# Patient Record
Sex: Female | Born: 1950 | Race: White | Hispanic: No | Marital: Married | Smoking: Former smoker
Health system: Southern US, Community
[De-identification: ages and names within clinical notes are randomized; demographics above are authoritative.]

## PROBLEM LIST (undated history)

## (undated) DIAGNOSIS — Z9289 Personal history of other medical treatment: Secondary | ICD-10-CM

## (undated) DIAGNOSIS — Z8709 Personal history of other diseases of the respiratory system: Secondary | ICD-10-CM

## (undated) DIAGNOSIS — F329 Major depressive disorder, single episode, unspecified: Secondary | ICD-10-CM

## (undated) DIAGNOSIS — R531 Weakness: Secondary | ICD-10-CM

## (undated) DIAGNOSIS — M542 Cervicalgia: Secondary | ICD-10-CM

## (undated) DIAGNOSIS — K219 Gastro-esophageal reflux disease without esophagitis: Secondary | ICD-10-CM

## (undated) DIAGNOSIS — Z9889 Other specified postprocedural states: Secondary | ICD-10-CM

## (undated) DIAGNOSIS — R0600 Dyspnea, unspecified: Secondary | ICD-10-CM

## (undated) DIAGNOSIS — I10 Essential (primary) hypertension: Secondary | ICD-10-CM

## (undated) DIAGNOSIS — M1990 Unspecified osteoarthritis, unspecified site: Secondary | ICD-10-CM

## (undated) DIAGNOSIS — R112 Nausea with vomiting, unspecified: Secondary | ICD-10-CM

## (undated) DIAGNOSIS — J449 Chronic obstructive pulmonary disease, unspecified: Secondary | ICD-10-CM

## (undated) DIAGNOSIS — K5900 Constipation, unspecified: Secondary | ICD-10-CM

## (undated) DIAGNOSIS — R9439 Abnormal result of other cardiovascular function study: Secondary | ICD-10-CM

## (undated) DIAGNOSIS — T7840XA Allergy, unspecified, initial encounter: Secondary | ICD-10-CM

## (undated) DIAGNOSIS — G4730 Sleep apnea, unspecified: Secondary | ICD-10-CM

## (undated) DIAGNOSIS — E119 Type 2 diabetes mellitus without complications: Secondary | ICD-10-CM

## (undated) DIAGNOSIS — Z860100 Personal history of colon polyps, unspecified: Secondary | ICD-10-CM

## (undated) DIAGNOSIS — Z8719 Personal history of other diseases of the digestive system: Secondary | ICD-10-CM

## (undated) DIAGNOSIS — E785 Hyperlipidemia, unspecified: Secondary | ICD-10-CM

## (undated) DIAGNOSIS — F32A Depression, unspecified: Secondary | ICD-10-CM

## (undated) DIAGNOSIS — H409 Unspecified glaucoma: Secondary | ICD-10-CM

## (undated) DIAGNOSIS — G8929 Other chronic pain: Secondary | ICD-10-CM

## (undated) HISTORY — PX: CHOLECYSTECTOMY: SHX55

## (undated) HISTORY — DX: Morbid (severe) obesity due to excess calories: E66.01

## (undated) HISTORY — PX: COLONOSCOPY: SHX174

## (undated) HISTORY — PX: APPENDECTOMY: SHX54

## (undated) HISTORY — PX: BREAST REDUCTION SURGERY: SHX8

## (undated) HISTORY — DX: Personal history of other medical treatment: Z92.89

## (undated) HISTORY — PX: CATARACT EXTRACTION, BILATERAL: SHX1313

## (undated) HISTORY — PX: OTHER SURGICAL HISTORY: SHX169

## (undated) HISTORY — DX: Other specified postprocedural states: Z98.890

## (undated) HISTORY — PX: BACK SURGERY: SHX140

## (undated) HISTORY — DX: Abnormal result of other cardiovascular function study: R94.39

## (undated) HISTORY — PX: ESOPHAGOGASTRODUODENOSCOPY: SHX1529

---

## 1999-05-20 ENCOUNTER — Encounter: Payer: Self-pay | Admitting: Neurosurgery

## 1999-05-20 ENCOUNTER — Encounter: Admission: RE | Admit: 1999-05-20 | Discharge: 1999-05-20 | Payer: Self-pay | Admitting: Neurosurgery

## 1999-05-31 ENCOUNTER — Encounter: Payer: Self-pay | Admitting: Neurosurgery

## 1999-06-04 ENCOUNTER — Inpatient Hospital Stay (HOSPITAL_COMMUNITY): Admission: RE | Admit: 1999-06-04 | Discharge: 1999-06-07 | Payer: Self-pay | Admitting: Neurosurgery

## 1999-06-04 ENCOUNTER — Encounter: Payer: Self-pay | Admitting: Neurosurgery

## 2000-08-18 ENCOUNTER — Encounter: Admission: RE | Admit: 2000-08-18 | Discharge: 2000-08-18 | Payer: Self-pay | Admitting: Neurosurgery

## 2000-08-18 ENCOUNTER — Encounter: Payer: Self-pay | Admitting: Neurosurgery

## 2000-08-21 ENCOUNTER — Encounter: Payer: Self-pay | Admitting: Neurosurgery

## 2000-08-21 ENCOUNTER — Ambulatory Visit (HOSPITAL_COMMUNITY): Admission: RE | Admit: 2000-08-21 | Discharge: 2000-08-21 | Payer: Self-pay | Admitting: Neurosurgery

## 2000-09-01 ENCOUNTER — Encounter: Admission: RE | Admit: 2000-09-01 | Discharge: 2000-09-01 | Payer: Self-pay | Admitting: Neurosurgery

## 2000-09-01 ENCOUNTER — Encounter: Payer: Self-pay | Admitting: Neurosurgery

## 2000-09-15 ENCOUNTER — Encounter: Admission: RE | Admit: 2000-09-15 | Discharge: 2000-09-15 | Payer: Self-pay | Admitting: Neurosurgery

## 2000-09-15 ENCOUNTER — Encounter: Payer: Self-pay | Admitting: Neurosurgery

## 2000-09-29 ENCOUNTER — Encounter: Payer: Self-pay | Admitting: Neurosurgery

## 2000-09-29 ENCOUNTER — Encounter: Admission: RE | Admit: 2000-09-29 | Discharge: 2000-09-29 | Payer: Self-pay | Admitting: Neurosurgery

## 2002-05-17 ENCOUNTER — Observation Stay (HOSPITAL_COMMUNITY): Admission: EM | Admit: 2002-05-17 | Discharge: 2002-05-18 | Payer: Self-pay | Admitting: Cardiology

## 2003-05-25 ENCOUNTER — Encounter: Admission: RE | Admit: 2003-05-25 | Discharge: 2003-05-25 | Payer: Self-pay | Admitting: Neurosurgery

## 2003-07-18 ENCOUNTER — Inpatient Hospital Stay (HOSPITAL_COMMUNITY): Admission: RE | Admit: 2003-07-18 | Discharge: 2003-07-23 | Payer: Self-pay | Admitting: Neurosurgery

## 2003-10-30 ENCOUNTER — Ambulatory Visit (HOSPITAL_COMMUNITY): Admission: RE | Admit: 2003-10-30 | Discharge: 2003-10-30 | Payer: Self-pay | Admitting: Orthopaedic Surgery

## 2004-03-20 ENCOUNTER — Ambulatory Visit: Payer: Self-pay | Admitting: Internal Medicine

## 2004-03-21 ENCOUNTER — Ambulatory Visit (HOSPITAL_COMMUNITY): Admission: RE | Admit: 2004-03-21 | Discharge: 2004-03-21 | Payer: Self-pay | Admitting: Internal Medicine

## 2004-04-05 ENCOUNTER — Ambulatory Visit (HOSPITAL_COMMUNITY): Admission: RE | Admit: 2004-04-05 | Discharge: 2004-04-05 | Payer: Self-pay | Admitting: Internal Medicine

## 2004-04-05 ENCOUNTER — Ambulatory Visit: Payer: Self-pay | Admitting: Internal Medicine

## 2011-10-24 DIAGNOSIS — R079 Chest pain, unspecified: Secondary | ICD-10-CM

## 2012-11-28 ENCOUNTER — Inpatient Hospital Stay (HOSPITAL_COMMUNITY)
Admission: EM | Admit: 2012-11-28 | Discharge: 2012-12-04 | DRG: 490 | Disposition: A | Discharge status: Home Health | Payer: Commercial Indemnity | Attending: Internal Medicine | Admitting: Internal Medicine

## 2012-11-28 DIAGNOSIS — J449 Chronic obstructive pulmonary disease, unspecified: Secondary | ICD-10-CM | POA: Diagnosis present

## 2012-11-28 DIAGNOSIS — Z981 Arthrodesis status: Secondary | ICD-10-CM

## 2012-11-28 DIAGNOSIS — M549 Dorsalgia, unspecified: Secondary | ICD-10-CM

## 2012-11-28 DIAGNOSIS — M48061 Spinal stenosis, lumbar region without neurogenic claudication: Secondary | ICD-10-CM | POA: Diagnosis present

## 2012-11-28 DIAGNOSIS — N390 Urinary tract infection, site not specified: Secondary | ICD-10-CM | POA: Diagnosis present

## 2012-11-28 DIAGNOSIS — Z96659 Presence of unspecified artificial knee joint: Secondary | ICD-10-CM

## 2012-11-28 DIAGNOSIS — M545 Low back pain, unspecified: Secondary | ICD-10-CM | POA: Diagnosis present

## 2012-11-28 DIAGNOSIS — B961 Klebsiella pneumoniae [K. pneumoniae] as the cause of diseases classified elsewhere: Secondary | ICD-10-CM | POA: Diagnosis present

## 2012-11-28 DIAGNOSIS — Z87891 Personal history of nicotine dependence: Secondary | ICD-10-CM

## 2012-11-28 DIAGNOSIS — F411 Generalized anxiety disorder: Secondary | ICD-10-CM | POA: Diagnosis present

## 2012-11-28 DIAGNOSIS — R339 Retention of urine, unspecified: Secondary | ICD-10-CM | POA: Diagnosis present

## 2012-11-28 DIAGNOSIS — R29898 Other symptoms and signs involving the musculoskeletal system: Secondary | ICD-10-CM | POA: Diagnosis present

## 2012-11-28 DIAGNOSIS — I10 Essential (primary) hypertension: Secondary | ICD-10-CM | POA: Diagnosis present

## 2012-11-28 DIAGNOSIS — G8929 Other chronic pain: Secondary | ICD-10-CM | POA: Diagnosis present

## 2012-11-28 DIAGNOSIS — E785 Hyperlipidemia, unspecified: Secondary | ICD-10-CM | POA: Diagnosis present

## 2012-11-28 DIAGNOSIS — Z9181 History of falling: Secondary | ICD-10-CM

## 2012-11-28 DIAGNOSIS — E860 Dehydration: Secondary | ICD-10-CM | POA: Diagnosis present

## 2012-11-28 DIAGNOSIS — M5126 Other intervertebral disc displacement, lumbar region: Principal | ICD-10-CM | POA: Diagnosis present

## 2012-11-28 DIAGNOSIS — J4489 Other specified chronic obstructive pulmonary disease: Secondary | ICD-10-CM | POA: Diagnosis present

## 2012-11-28 HISTORY — DX: Unspecified osteoarthritis, unspecified site: M19.90

## 2012-11-28 HISTORY — DX: Essential (primary) hypertension: I10

## 2012-11-28 HISTORY — DX: Major depressive disorder, single episode, unspecified: F32.9

## 2012-11-28 HISTORY — DX: Sleep apnea, unspecified: G47.30

## 2012-11-28 HISTORY — DX: Hyperlipidemia, unspecified: E78.5

## 2012-11-28 HISTORY — DX: Depression, unspecified: F32.A

## 2012-11-28 HISTORY — DX: Chronic obstructive pulmonary disease, unspecified: J44.9

## 2012-11-28 HISTORY — DX: Gastro-esophageal reflux disease without esophagitis: K21.9

## 2012-11-28 HISTORY — DX: Personal history of other diseases of the digestive system: Z87.19

## 2012-11-28 LAB — COMPREHENSIVE METABOLIC PANEL
ALT: 20 U/L (ref 0–35)
AST: 23 U/L (ref 0–37)
Albumin: 4.5 g/dL (ref 3.5–5.2)
Alkaline Phosphatase: 53 U/L (ref 39–117)
BUN: 15 mg/dL (ref 6–23)
CO2: 25 mEq/L (ref 19–32)
Chloride: 100 mEq/L (ref 96–112)
Creatinine, Ser: 0.73 mg/dL (ref 0.50–1.10)
GFR calc Af Amer: >90 mL/min (ref >90)
GFR calc non Af Amer: 88 mL/min — ABNORMAL LOW (ref >90)
Glucose, Bld: 133 mg/dL — ABNORMAL HIGH (ref 70–99)
Potassium: 3.7 mEq/L (ref 3.5–5.1)
Sodium: 139 mEq/L (ref 135–145)
Total Bilirubin: 0.4 mg/dL (ref 0.3–1.2)
Total Protein: 7.9 g/dL (ref 6.0–8.3)

## 2012-11-28 LAB — URINALYSIS, ROUTINE W REFLEX MICROSCOPIC
Glucose, UA: NEGATIVE mg/dL
Hgb urine dipstick: NEGATIVE
Ketones, ur: NEGATIVE mg/dL
Leukocytes, UA: NEGATIVE
Nitrite: NEGATIVE
Protein, ur: 30 mg/dL — AB
Urobilinogen, UA: 0.2 mg/dL (ref 0.0–1.0)
pH: 5.5 (ref 5.0–8.0)

## 2012-11-28 LAB — CBC WITH DIFFERENTIAL/PLATELET
Basophils Absolute: 0 10*3/uL (ref 0.0–0.1)
Basophils Relative: 0 % (ref 0–1)
Eosinophils Absolute: 0 10*3/uL (ref 0.0–0.7)
HCT: 36.9 % (ref 36.0–46.0)
Hemoglobin: 12.8 g/dL (ref 12.0–15.0)
Lymphocytes Relative: 12 % (ref 12–46)
Lymphs Abs: 1.2 10*3/uL (ref 0.7–4.0)
MCH: 29.6 pg (ref 26.0–34.0)
MCHC: 34.7 g/dL (ref 30.0–36.0)
Monocytes Absolute: 0.4 10*3/uL (ref 0.1–1.0)
Monocytes Relative: 4 % (ref 3–12)
Neutro Abs: 8 10*3/uL — ABNORMAL HIGH (ref 1.7–7.7)
Neutrophils Relative %: 83 % — ABNORMAL HIGH (ref 43–77)
Platelets: 176 10*3/uL (ref 150–400)
RBC: 4.33 MIL/uL (ref 3.87–5.11)
RDW: 13 % (ref 11.5–15.5)
WBC: 9.6 10*3/uL (ref 4.0–10.5)

## 2012-11-28 LAB — URINE MICROSCOPIC-ADD ON

## 2012-11-28 MED ORDER — ALUM & MAG HYDROXIDE-SIMETH 200-200-20 MG/5ML PO SUSP
30.0000 mL | Freq: Four times a day (QID) | ORAL | Status: DC | PRN
  Administered 2012-12-01: 30 mL via ORAL
  Filled 2012-11-28: qty 30

## 2012-11-28 MED ORDER — HYDROMORPHONE HCL PF 1 MG/ML IJ SOLN
1.0000 mg | Freq: Once | INTRAMUSCULAR | Status: AC
  Administered 2012-11-28: 1 mg via INTRAVENOUS
  Filled 2012-11-28: qty 1

## 2012-11-28 MED ORDER — KETOROLAC TROMETHAMINE 15 MG/ML IJ SOLN
15.0000 mg | Freq: Four times a day (QID) | INTRAMUSCULAR | Status: AC | PRN
  Filled 2012-11-28: qty 1

## 2012-11-28 MED ORDER — ADULT MULTIVITAMIN W/MINERALS CH
1.0000 | ORAL_TABLET | Freq: Every day | ORAL | Status: DC
  Administered 2012-11-28 – 2012-12-04 (×5): 1 via ORAL
  Filled 2012-11-28 (×7): qty 1

## 2012-11-28 MED ORDER — ACETAMINOPHEN 650 MG RE SUPP: 650.0000 mg | Freq: Four times a day (QID) | RECTAL | Status: DC | PRN

## 2012-11-28 MED ORDER — ACETAMINOPHEN 325 MG PO TABS
650.0000 mg | ORAL_TABLET | Freq: Four times a day (QID) | ORAL | Status: DC | PRN
  Filled 2012-11-28: qty 2

## 2012-11-28 MED ORDER — KETOROLAC TROMETHAMINE 30 MG/ML IJ SOLN
INTRAMUSCULAR | Status: AC
  Filled 2012-11-28: qty 1

## 2012-11-28 MED ORDER — ASPIRIN 81 MG PO CHEW
81.0000 mg | CHEWABLE_TABLET | Freq: Every day | ORAL | Status: DC
  Administered 2012-11-28 – 2012-12-04 (×5): 81 mg via ORAL
  Filled 2012-11-28 (×6): qty 1

## 2012-11-28 MED ORDER — HEPARIN SODIUM (PORCINE) 5000 UNIT/ML IJ SOLN
5000.0000 [IU] | Freq: Three times a day (TID) | INTRAMUSCULAR | Status: DC
  Filled 2012-11-28 (×4): qty 1

## 2012-11-28 MED ORDER — ATORVASTATIN CALCIUM 40 MG PO TABS
40.0000 mg | ORAL_TABLET | Freq: Every day | ORAL | Status: DC
  Administered 2012-11-28 – 2012-12-02 (×3): 40 mg via ORAL
  Filled 2012-11-28 (×7): qty 1

## 2012-11-28 MED ORDER — LORATADINE 10 MG PO TABS
10.0000 mg | ORAL_TABLET | Freq: Every day | ORAL | Status: DC
  Administered 2012-11-28 – 2012-12-04 (×6): 10 mg via ORAL
  Filled 2012-11-28 (×7): qty 1

## 2012-11-28 MED ORDER — ONDANSETRON HCL 4 MG/2ML IJ SOLN
4.0000 mg | Freq: Four times a day (QID) | INTRAMUSCULAR | Status: DC | PRN
  Administered 2012-11-28 – 2012-12-02 (×4): 4 mg via INTRAVENOUS
  Filled 2012-11-28 (×4): qty 2

## 2012-11-28 MED ORDER — KETOROLAC TROMETHAMINE 30 MG/ML IJ SOLN
30.0000 mg | Freq: Once | INTRAMUSCULAR | Status: AC
  Administered 2012-11-28: 30 mg via INTRAVENOUS

## 2012-11-28 MED ORDER — OMEGA-3-ACID ETHYL ESTERS 1 G PO CAPS
2.0000 g | ORAL_CAPSULE | Freq: Two times a day (BID) | ORAL | Status: DC
  Administered 2012-11-28 – 2012-12-04 (×9): 2 g via ORAL
  Filled 2012-11-28 (×14): qty 2

## 2012-11-28 MED ORDER — PANTOPRAZOLE SODIUM 40 MG PO TBEC
40.0000 mg | DELAYED_RELEASE_TABLET | Freq: Every day | ORAL | Status: DC
  Administered 2012-11-28 – 2012-12-01 (×4): 40 mg via ORAL
  Filled 2012-11-28 (×5): qty 1

## 2012-11-28 MED ORDER — SODIUM CHLORIDE 0.9 % IV SOLN
INTRAVENOUS | Status: DC
  Administered 2012-11-28 – 2012-11-29 (×2): 75 mL/h via INTRAVENOUS
  Administered 2012-11-30 (×2): via INTRAVENOUS
  Administered 2012-12-01: 100 mL/h via INTRAVENOUS

## 2012-11-28 MED ORDER — MONTELUKAST SODIUM 10 MG PO TABS
10.0000 mg | ORAL_TABLET | Freq: Every day | ORAL | Status: DC
  Administered 2012-11-30 – 2012-12-03 (×4): 10 mg via ORAL
  Filled 2012-11-28 (×7): qty 1

## 2012-11-28 MED ORDER — SENNA 8.6 MG PO TABS
1.0000 | ORAL_TABLET | Freq: Every day | ORAL | Status: DC
  Administered 2012-11-28 – 2012-12-04 (×6): 8.6 mg via ORAL
  Filled 2012-11-28 (×7): qty 1

## 2012-11-28 MED ORDER — HYDROCODONE-ACETAMINOPHEN 5-325 MG PO TABS
1.0000 | ORAL_TABLET | Freq: Once | ORAL | Status: AC
  Administered 2012-11-28: 1 via ORAL
  Filled 2012-11-28: qty 1

## 2012-11-28 MED ORDER — HYDROMORPHONE HCL PF 1 MG/ML IJ SOLN: 1.0000 mg | INTRAMUSCULAR | Status: DC | PRN

## 2012-11-28 MED ORDER — HYDROMORPHONE HCL PF 1 MG/ML IJ SOLN
1.0000 mg | INTRAMUSCULAR | Status: DC | PRN
  Administered 2012-11-28 – 2012-11-29 (×2): 2 mg via INTRAVENOUS
  Administered 2012-11-29 – 2012-11-30 (×4): 1 mg via INTRAVENOUS
  Administered 2012-12-01 (×2): 2 mg via INTRAVENOUS
  Administered 2012-12-02 – 2012-12-03 (×2): 1 mg via INTRAVENOUS
  Filled 2012-11-28: qty 1
  Filled 2012-11-28: qty 2
  Filled 2012-11-28: qty 1
  Filled 2012-11-28 (×2): qty 2
  Filled 2012-11-28: qty 1
  Filled 2012-11-28: qty 2
  Filled 2012-11-28 (×3): qty 1
  Filled 2012-11-28: qty 2
  Filled 2012-11-28: qty 1

## 2012-11-28 MED ORDER — LISINOPRIL 40 MG PO TABS
40.0000 mg | ORAL_TABLET | Freq: Every day | ORAL | Status: DC
  Administered 2012-11-28 – 2012-12-04 (×6): 40 mg via ORAL
  Filled 2012-11-28 (×7): qty 1

## 2012-11-28 MED ORDER — OMEGA-3 FATTY ACIDS 1000 MG PO CAPS: 2.0000 g | ORAL_CAPSULE | Freq: Two times a day (BID) | ORAL | Status: DC

## 2012-11-28 MED ORDER — TEMAZEPAM 15 MG PO CAPS
15.0000 mg | ORAL_CAPSULE | Freq: Every evening | ORAL | Status: DC | PRN
  Administered 2012-11-28 – 2012-12-03 (×6): 15 mg via ORAL
  Filled 2012-11-28 (×6): qty 1

## 2012-11-28 MED ORDER — SERTRALINE HCL 100 MG PO TABS
100.0000 mg | ORAL_TABLET | Freq: Every day | ORAL | Status: DC
  Administered 2012-11-28: 100 mg via ORAL
  Filled 2012-11-28 (×2): qty 1

## 2012-11-28 MED ORDER — OXYCODONE-ACETAMINOPHEN 5-325 MG PO TABS
1.0000 | ORAL_TABLET | ORAL | Status: DC | PRN
  Administered 2012-11-28 – 2012-12-04 (×14): 2 via ORAL
  Filled 2012-11-28 (×16): qty 2

## 2012-11-28 MED ORDER — ONDANSETRON HCL 4 MG PO TABS
4.0000 mg | ORAL_TABLET | Freq: Four times a day (QID) | ORAL | Status: DC | PRN
  Filled 2012-11-28: qty 1

## 2012-11-28 MED ORDER — ALBUTEROL SULFATE (5 MG/ML) 0.5% IN NEBU: 2.5000 mg | INHALATION_SOLUTION | RESPIRATORY_TRACT | Status: DC | PRN

## 2012-11-28 MED ORDER — HYDROMORPHONE HCL PF 1 MG/ML IJ SOLN
INTRAMUSCULAR | Status: AC
  Filled 2012-11-28: qty 2

## 2012-11-28 MED ORDER — HYDROMORPHONE HCL PF 1 MG/ML IJ SOLN
2.0000 mg | Freq: Once | INTRAMUSCULAR | Status: AC
  Administered 2012-11-28: 2 mg via INTRAVENOUS

## 2012-11-28 NOTE — ED Notes (Signed)
Labs sent down to main lab.

## 2012-11-28 NOTE — Consult Note (Signed)
Reason for Consult:LBP Referring Physician: HOSPITALIST  Lynn Dixon is an 62 y.o. female.  HPI: PATIENT WHo had lumbar fusion by me in the past and now she is admitted because of several weeks of lbp with radiation to the left leg associated with burning sensation down to foot not better with pain medications or TENS UNIT No past medical history on file.  LUMBAR FUSION  No family history on file.  Social History:  has no tobacco, alcohol, and drug history on file.  Allergies:  Allergies  Allergen Reactions  . Coconut Oil Anaphylaxis  . Morphine And Related Other (See Comments)    unknown  . Estrogens Rash    Medications: see H&P  Results for orders placed during the hospital encounter of 11/28/12 (from the past 48 hour(s))  CBC WITH DIFFERENTIAL     Status: Abnormal   Collection Time    11/28/12  2:47 AM      Result Value Range   WBC 9.6  4.0 - 10.5 K/uL   RBC 4.33  3.87 - 5.11 MIL/uL   Hemoglobin 12.8  12.0 - 15.0 g/dL   HCT 40.9  81.1 - 91.4 %   MCV 85.2  78.0 - 100.0 fL   MCH 29.6  26.0 - 34.0 pg   MCHC 34.7  30.0 - 36.0 g/dL   RDW 78.2  95.6 - 21.3 %   Platelets 176  150 - 400 K/uL   Neutrophils Relative % 83 (*) 43 - 77 %   Neutro Abs 8.0 (*) 1.7 - 7.7 K/uL   Lymphocytes Relative 12  12 - 46 %   Lymphs Abs 1.2  0.7 - 4.0 K/uL   Monocytes Relative 4  3 - 12 %   Monocytes Absolute 0.4  0.1 - 1.0 K/uL   Eosinophils Relative 0  0 - 5 %   Eosinophils Absolute 0.0  0.0 - 0.7 K/uL   Basophils Relative 0  0 - 1 %   Basophils Absolute 0.0  0.0 - 0.1 K/uL  COMPREHENSIVE METABOLIC PANEL     Status: Abnormal   Collection Time    11/28/12  2:47 AM      Result Value Range   Sodium 139  135 - 145 mEq/L   Potassium 3.7  3.5 - 5.1 mEq/L   Chloride 100  96 - 112 mEq/L   CO2 25  19 - 32 mEq/L   Glucose, Bld 133 (*) 70 - 99 mg/dL   BUN 15  6 - 23 mg/dL   Creatinine, Ser 0.86  0.50 - 1.10 mg/dL   Calcium 9.8  8.4 - 57.8 mg/dL   Total Protein 7.9  6.0 - 8.3 g/dL   Albumin 4.5  3.5 - 5.2 g/dL   AST 23  0 - 37 U/L   ALT 20  0 - 35 U/L   Alkaline Phosphatase 53  39 - 117 U/L   Total Bilirubin 0.4  0.3 - 1.2 mg/dL   GFR calc non Af Amer 88 (*) >90 mL/min   GFR calc Af Amer >90  >90 mL/min   Comment: (NOTE)     The eGFR has been calculated using the CKD EPI equation.     This calculation has not been validated in all clinical situations.     eGFR's persistently <90 mL/min signify possible Chronic Kidney     Disease.  URINALYSIS, ROUTINE W REFLEX MICROSCOPIC     Status: Abnormal   Collection Time    11/28/12  3:06  AM      Result Value Range   Color, Urine YELLOW  YELLOW   APPearance CLEAR  CLEAR   Specific Gravity, Urine 1.015  1.005 - 1.030   pH 5.5  5.0 - 8.0   Glucose, UA NEGATIVE  NEGATIVE mg/dL   Hgb urine dipstick NEGATIVE  NEGATIVE   Bilirubin Urine NEGATIVE  NEGATIVE   Ketones, ur NEGATIVE  NEGATIVE mg/dL   Protein, ur 30 (*) NEGATIVE mg/dL   Urobilinogen, UA 0.2  0.0 - 1.0 mg/dL   Nitrite NEGATIVE  NEGATIVE   Leukocytes, UA NEGATIVE  NEGATIVE  URINE MICROSCOPIC-ADD ON     Status: Abnormal   Collection Time    11/28/12  3:06 AM      Result Value Range   Squamous Epithelial / LPF RARE  RARE   WBC, UA 0-2  <3 WBC/hpf   RBC / HPF 0-2  <3 RBC/hpf   Bacteria, UA RARE  RARE   Casts HYALINE CASTS (*) NEGATIVE    No results found.  Review of Systems  Constitutional: Negative.   HENT: Negative.   Eyes: Negative.   Cardiovascular: Negative.   Gastrointestinal: Negative.   Genitourinary: Negative.   Musculoskeletal: Positive for back pain.  Skin: Negative.   Neurological: Positive for sensory change and focal weakness.  Endo/Heme/Allergies: Negative.   Psychiatric/Behavioral: Negative.    Blood pressure 149/62, pulse 61, temperature 97.6 F (36.4 C), temperature source Oral, resp. rate 18, SpO2 99.00%. Physical ExamHENT, NL. NECK, NL. CV, NL LUNGS, CLEAR. ABDOMEN, SOFT, EXTREMITIES, NO EDEMA. Neuro HYPERSTHESIA at l4-5  dermatomes. slr positive left at 30 degrees  Assessment/Plan: Pain control and lumbar myelogram in am  Teriah Muela M 11/28/2012, 12:16 PM

## 2012-11-28 NOTE — ED Notes (Signed)
Report given to Bobby RN

## 2012-11-28 NOTE — Progress Notes (Signed)
Called for MRI d/t STAT order, MRI will be done as soon as they can get to her d/t several STAT MRI's from ED. MD notified.

## 2012-11-28 NOTE — H&P (Signed)
History and Physical       Hospital Admission Note Date: 11/28/2012  Patient name: Lynn Dixon Medical record number: [id number] Date of birth: May 19, 1950 Age: 62 y.o. Gender: female PCP: [name], MD    Chief Complaint:  Acute on chronic back pain   HPI: Patient is a 62 year old female with history of hypertension, hyperlipidemia, COPD, had prior 2 back surgeries performed by Dr. Jeral Fruit presented to the ED acute back pain radiating down her left leg. History was obtained from the patient who reported that she actually had a fall a month ago when she was cleaning her bathtub and her hand slipped and she fell on the floor on her buttocks. She was having lower back pain and went to her PCP ordered x-rays which according to the patient were normal and was ordered physical therapy. Per patient, she has been going to physical therapy for last 2 weeks which worsened her pain. In the last 1 week, her pain worsened and radiating down to the left leg. She was not able to ambulate with the left leg due to the pain. She denies any fevers, chills, numbness or tingling, urinary or bowel incontinence. Per patient, she went to see another physician, who ordered the MRI of the lumbar spine which was done in [place] and she was recommended to see Dr. Jeral Fruit in office. Patient reported that she could not wait for the office appointment and came to the ER. At the time of my examination, patient is in severe pain 9/10, states radiating down to the left leg.  Review of Systems:  Constitutional: Denies fever, chills, diaphoresis, poor appetite and fatigue.  HEENT: Denies photophobia, eye pain, redness, hearing loss, ear pain, congestion, sore throat, rhinorrhea, sneezing, mouth sores, trouble swallowing, neck pain, neck stiffness and tinnitus.   Respiratory: Denies SOB, DOE, cough, chest tightness,  and wheezing.   Cardiovascular: Denies chest  pain, palpitations and leg swelling.  Gastrointestinal: Denies nausea, vomiting, abdominal pain, diarrhea, constipation, blood in stool and abdominal distention.  Genitourinary: Denies dysuria, urgency, frequency, hematuria, flank pain and difficulty urinating.  Musculoskeletal: see HPI  Skin: Denies pallor, rash and wound.  Neurological: Denies dizziness, seizures, syncope, weakness, light-headedness, numbness and headaches.  Hematological: Denies adenopathy. Easy bruising, personal or family bleeding history  Psychiatric/Behavioral: Denies suicidal ideation, mood changes, confusion, nervousness, sleep disturbance and agitation  Past Medical History: COPD Hypertension Hyperlipidemia Anxiety Chronic back pain   Past surgical history First back surgery 2000 by Dr. 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 Authorizing Provider  aspirin 81 MG chewable tablet Chew 81 mg by mouth daily.   Yes Historical Provider, MD  CALCIUM PO Take 1 tablet by mouth daily.   Yes Historical Provider, MD  fish oil-omega-3 fatty acids 1000 MG capsule Take 2 g by mouth 2 (two) times daily.   Yes Historical Provider, MD  GLUCOSAMINE-CHONDROITIN PO Take 1 tablet by mouth daily.   Yes Historical Provider, MD  HYDROcodone-acetaminophen (NORCO/VICODIN) 5-325 MG per tablet Take 1-2 tablets by mouth every 8 (eight) hours as needed for pain.   Yes Historical Provider, MD  lisinopril (PRINIVIL,ZESTRIL) 40 MG tablet Take 40 mg by mouth daily.   Yes Historical Provider, MD  loratadine (CLARITIN) 10 MG tablet Take 10 mg by mouth daily.  Yes Historical Provider, MD  montelukast (SINGULAIR) 10 MG tablet Take 10 mg by mouth at bedtime.   Yes Historical Provider, MD  Multiple Vitamin (MULTIVITAMIN WITH  MINERALS) TABS tablet Take 1 tablet by mouth daily.   Yes Historical Provider, MD  omeprazole (PRILOSEC) 20 MG capsule Take 40 mg by mouth daily.   Yes Historical Provider, MD  senna (SENOKOT) 8.6 MG TABS tablet Take 1 tablet by mouth daily.   Yes Historical Provider, MD  sertraline (ZOLOFT) 100 MG tablet Take 100 mg by mouth daily.   Yes Historical Provider, MD  simvastatin (ZOCOR) 80 MG tablet Take 40 mg by mouth at bedtime.   Yes Historical Provider, MD  temazepam (RESTORIL) 15 MG capsule Take 15 mg by mouth at bedtime as needed for sleep.   Yes Historical Provider, MD    Allergies:   Allergies  Allergen Reactions  . Coconut Oil Anaphylaxis  . Morphine And Related Other (See Comments)    unknown  . Estrogens Rash    Social History:  has no tobacco, alcohol, and drug history on file. patient quit smoking 40 years ago, does not drink alcohol or use any drugs. She lives at home with her family and ambulates without any assistance prior to this fall  Family History: No family history on file.  Physical Exam: Blood pressure 149/62, pulse 61, temperature 97.6 F (36.4 C), temperature source Oral, resp. rate 18, SpO2 99.00%. General: Alert, awake, oriented x3, in no acute distress. HEENT: normocephalic, atraumatic, anicteric sclera, pink conjunctiva, pupils equal and reactive to light and accomodation, oropharynx clear Neck: supple, no masses or lymphadenopathy, no goiter, no bruits  Heart: Regular rate and rhythm, without murmurs, rubs or gallops. Lungs: Clear to auscultation bilaterally, no wheezing, rales or rhonchi. Abdomen: Soft, nontender, nondistended, positive bowel sounds, no masses. Extremities: No clubbing, cyanosis or edema with positive pedal pulses. Neuro: Grossly intact, no focal neurological deficits, strength 5/5 upper and lower extremities right. Strength 5/5 upper left but on lower extremity, it seems poor effort due to pain Back: Spinal tenderness noted in L1-L2 and  L2-3 region  Psych: alert and oriented x 3, normal mood and affect Skin: no rashes or lesions, warm and dry   LABS on Admission:  Basic Metabolic Panel:  Recent Labs Lab 11/28/12 0247  NA 139  K 3.7  CL 100  CO2 25  GLUCOSE 133*  BUN 15  CREATININE 0.73  CALCIUM 9.8   Liver Function Tests:  Recent Labs Lab 11/28/12 0247  AST 23  ALT 20  ALKPHOS 53  BILITOT 0.4  PROT 7.9  ALBUMIN 4.5   No results found for this basename: LIPASE, AMYLASE,  in the last 168 hours No results found for this basename: AMMONIA,  in the last 168 hours CBC:  Recent Labs Lab 11/28/12 0247  WBC 9.6  NEUTROABS 8.0*  HGB 12.8  HCT 36.9  MCV 85.2  PLT 176   Cardiac Enzymes: No results found for this basename: CKTOTAL, CKMB, CKMBINDEX, TROPONINI,  in the last 168 hours BNP: No components found with this basename: POCBNP,  CBG: No results found for this basename: GLUCAP,  in the last 168 hours   Radiological Exams on Admission: No results found.  Assessment/Plan Principal Problem:   Acute lumbar back pain - Will admit to medicine for further workup. Patient had MRI done 3 days ago in [place]. I was finally able to get a verbal report from [facility]ology [facility] from [facility].  MRI report:  States postop changes with posterior fusion L2-L3, status post laminectomy L4-L5.  L1-L2 disc desiccation and diffuse disc bulging. Moderate right and severe left foraminal narrowing with mild central canal stenosis. - Discussed with neurosurgery, Dr. Jeral Fruit, who recommended ordering a lumbar myelogram tomorrow. Dr. Jeral Fruit will see her today. - Will attempt maximal pain control and follow neurosurgery recs.  Active Problems:    HTN (hypertension), benign - Continue lisinopril    Other and unspecified hyperlipidemia: - Continue Lipitor    COPD (chronic obstructive pulmonary disease): -Currently stable, place on albuterol nebs as needed     Dehydration: Clinically appears to  be somewhat dehydrated - Given patient had some nausea and episode of vomiting after Dilaudid, will place her on a clear liquid diet and Zofran as needed - Continue PPI and IV fluid hydration   DVT prophylaxis: Heparin subcutaneous  CODE STATUS: Full code  Further plan will depend as patient's clinical course evolves and further radiologic and laboratory data become available.   Time Spent on Admission: 1 hour  Tyrianna Lightle M.D. [facility] 11/28/2012, 8:34 AM Pager: [phone]  If 7PM-7AM, please contact night-coverage [email or web address] Password [email or web address]

## 2012-11-28 NOTE — Progress Notes (Signed)
Utilization review completed.  

## 2012-11-28 NOTE — ED Notes (Signed)
Per pt, she has been having back pain for several weeks. She has been diagnosed by her EDP and is going to see her surgeon. There are areas in lower back placing pressure on spine and nerves. Members has intermittent spasms in back. Pain at 10. She does not have pain medication that is helping pain. No cardiac or respiratory distress.

## 2012-11-28 NOTE — ED Provider Notes (Addendum)
CSN: [id number]     Arrival date & time 11/28/12  0141 History     First MD Initiated Contact with Patient 11/28/12 0239     Chief Complaint  Patient presents with  . Back Pain   (Consider location/radiation/quality/duration/timing/severity/associated sxs/prior Treatment) HPI Comments: This patient is a 62 year old female with a past medical history significant for back surgery performed by a neurosurgeon in [place]. This was approximately 10 years ago and has been doing relatively well since that time. Over the past several months her back pain has been flaring up and recently had an MRI performed in the [place], [place]. She is unsure of the results of these however she was told by the physician that ordered it she needs to see a neurosurgeon again. She denies to me she is having any bowel or bladder incontinence. She denies any fevers or chills. She rates her pain as severe 10 out of 10 and is getting no relief with home medications.  Patient is a 62 y.o. female presenting with back pain. The history is provided by the patient.  Back Pain Location:  Lumbar spine Quality:  Cramping and stabbing Radiates to:  Does not radiate Pain severity:  Severe Pain is:  Same all the time Onset quality:  Sudden Duration:  2 days Timing:  Constant Progression:  Worsening Chronicity:  Chronic   No past medical history on file. No past surgical history on file. No family history on file. History  Substance Use Topics  . Smoking status: Not on file  . Smokeless tobacco: Not on file  . Alcohol Use: Not on file   OB History   No data available     Review of Systems  Musculoskeletal: Positive for back pain.  All other systems reviewed and are negative.    Allergies  Coconut oil; Morphine and related; and Estrogens  Home Medications   Current Outpatient Rx  Name  Route  Sig  Dispense  Refill  . aspirin 81 MG chewable tablet   Oral   Chew 81 mg by mouth daily.         Marland Kitchen  CALCIUM PO   Oral   Take 1 tablet by mouth daily.         . fish oil-omega-3 fatty acids 1000 MG capsule   Oral   Take 2 g by mouth 2 (two) times daily.         Marland Kitchen GLUCOSAMINE-CHONDROITIN PO   Oral   Take 1 tablet by mouth daily.         Marland Kitchen HYDROcodone-acetaminophen (NORCO/VICODIN) 5-325 MG per tablet   Oral   Take 1-2 tablets by mouth every 8 (eight) hours as needed for pain.         Marland Kitchen lisinopril (PRINIVIL,ZESTRIL) 40 MG tablet   Oral   Take 40 mg by mouth daily.         Marland Kitchen loratadine (CLARITIN) 10 MG tablet   Oral   Take 10 mg by mouth daily.         . montelukast (SINGULAIR) 10 MG tablet   Oral   Take 10 mg by mouth at bedtime.         . Multiple Vitamin (MULTIVITAMIN WITH MINERALS) TABS tablet   Oral   Take 1 tablet by mouth daily.         Marland Kitchen omeprazole (PRILOSEC) 20 MG capsule   Oral   Take 40 mg by mouth daily.         Marland Kitchen senna (  SENOKOT) 8.6 MG TABS tablet   Oral   Take 1 tablet by mouth daily.         . sertraline (ZOLOFT) 100 MG tablet   Oral   Take 100 mg by mouth daily.         . simvastatin (ZOCOR) 80 MG tablet   Oral   Take 40 mg by mouth at bedtime.         . temazepam (RESTORIL) 15 MG capsule   Oral   Take 15 mg by mouth at bedtime as needed for sleep.          BP 112/83  Pulse 62  Temp(Src) 98.1 F (36.7 C) (Oral)  Resp 14  SpO2 94% Physical Exam  Nursing note and vitals reviewed. Constitutional: She is oriented to person, place, and time. She appears well-developed and well-nourished.  Patient appears quite uncomfortable  HENT:  Head: Normocephalic and atraumatic.  Mouth/Throat: Oropharynx is clear and moist.  Neck: Normal range of motion. Neck supple.  Cardiovascular: Normal rate, regular rhythm and normal heart sounds.   No murmur heard. Pulmonary/Chest: Effort normal and breath sounds normal. No respiratory distress.  Abdominal: Soft. Bowel sounds are normal.  Musculoskeletal: Normal range of motion. She  exhibits no edema.  There is tenderness to palpation in the soft tissues of the lumbar spine. There is no bony tenderness and no step-off.  Neurological: She is alert and oriented to person, place, and time.  Deep tendon reflexes are 1+ and equal in the bilateral lower extremities. Strength is 5 out of 5 in the bilateral lower extremities. Patient is having severe pain with movement and is unable to sit up.  Skin: Skin is warm and dry. She is not diaphoretic.    ED Course   Procedures (including critical care time)  Labs Reviewed  CBC WITH DIFFERENTIAL - Abnormal; Notable for the following:    Neutrophils Relative % 83 (*)    Neutro Abs 8.0 (*)    All other components within normal limits  COMPREHENSIVE METABOLIC PANEL - Abnormal; Notable for the following:    Glucose, Bld 133 (*)    GFR calc non Af Amer 88 (*)    All other components within normal limits  URINALYSIS, ROUTINE W REFLEX MICROSCOPIC - Abnormal; Notable for the following:    Protein, ur 30 (*)    All other components within normal limits  URINE MICROSCOPIC-ADD ON - Abnormal; Notable for the following:    Casts HYALINE CASTS (*)    All other components within normal limits   No results found. No diagnosis found.  MDM  Patient presents here with an exacerbation of her chronic back pain. She's been given repeat doses of dilaudid, however has not achieved much relief. There are no bowel or bladder complaints and reflexes are symmetrical and strength is intact. There is nothing to suggest cauda equina are emergently surgical cause. She recently had an MRI performed however the results of this are unobtainable as this was performed in [place], [place].  As she has obtained no relief with interventions in the ER, I feel as though I have no choice but to discuss admission. Dr. Lovell Sheehan from [facility] notified and patient will be admitted to the hospitalist.  Geoffery Lyons, MD 11/28/12 1610  Geoffery Lyons, MD 11/28/12 [phone]

## 2012-11-29 ENCOUNTER — Inpatient Hospital Stay (HOSPITAL_COMMUNITY): Payer: Commercial Indemnity

## 2012-11-29 LAB — URINALYSIS, ROUTINE W REFLEX MICROSCOPIC
Ketones, ur: NEGATIVE mg/dL
Nitrite: POSITIVE — AB
Protein, ur: 100 mg/dL — AB
Urobilinogen, UA: 1 mg/dL (ref 0.0–1.0)

## 2012-11-29 LAB — CBC
MCH: 29.4 pg (ref 26.0–34.0)
MCHC: 33.7 g/dL (ref 30.0–36.0)
Platelets: 170 10*3/uL (ref 150–400)

## 2012-11-29 LAB — BASIC METABOLIC PANEL
BUN: 22 mg/dL (ref 6–23)
Calcium: 9.3 mg/dL (ref 8.4–10.5)
GFR calc Af Amer: 67 mL/min — ABNORMAL LOW (ref >90)
GFR calc non Af Amer: 58 mL/min — ABNORMAL LOW (ref >90)
Glucose, Bld: 99 mg/dL (ref 70–99)
Sodium: 139 mEq/L (ref 135–145)

## 2012-11-29 MED ORDER — DEXTROSE 5 % IV SOLN
1.0000 g | INTRAVENOUS | Status: DC
  Administered 2012-11-29 – 2012-12-03 (×5): 1 g via INTRAVENOUS
  Filled 2012-11-29 (×7): qty 10

## 2012-11-29 MED ORDER — PHENAZOPYRIDINE HCL 200 MG PO TABS
200.0000 mg | ORAL_TABLET | Freq: Three times a day (TID) | ORAL | Status: AC | PRN
  Filled 2012-11-29: qty 1

## 2012-11-29 NOTE — Plan of Care (Signed)
Just received call from radiology that they will postpone the myelogram to tomorrow as patient had received zoloft yesterday.    Lynn Dixon M.D. [facility] 11/29/2012, 8:52 AM  Pager: [phone]

## 2012-11-29 NOTE — Progress Notes (Signed)
Patient ID: Lynn Dixon  female  AVW:[id number]    DOB: May 07, 1950    DOA: 11/28/2012  PCP: [name], MD  Assessment/Plan: Principal Problem: Acute lumbar back pain  - Patient had MRI done 3 days ago in [place].Per verbal report from radiology Department from [facility].  - MRI report: postop changes with posterior fusion L2-L3, status post laminectomy L4-L5. L1-L2 disc desiccation and diffuse disc bulging. Moderate right and severe left foraminal narrowing with mild central canal stenosis.  - neurosurgery, Dr. Jeral Fruit, following. Myelogram was ordered for today but postponed by radiology as patient had zoloft yesterday. Zoloft dc'ed.      - Cont pain control, bed rest   HTN (hypertension), benign  - Continue lisinopril   Other and unspecified hyperlipidemia:  - Continue Lipitor   COPD (chronic obstructive pulmonary disease):  -Currently stable, place on albuterol nebs as needed   Dehydration: improving - Continue PPI and IV fluid hydration   DVT Prophylaxis:  Code Status:  Disposition:    Subjective:  Feeling a whole lot better with the pain today, wants to get up for using the bathroom. Foley placed last night due to urinary retention.   Objective: Weight change:   Intake/Output Summary (Last 24 hours) at 11/29/12 1155 Last data filed at 11/29/12 0700  Gross per 24 hour  Intake    600 ml  Output    550 ml  Net     50 ml   Blood pressure 101/57, pulse 54, temperature 98.3 F (36.8 C), temperature source Oral, resp. rate 16, SpO2 99.00%.  Physical Exam: General: Alert and awake, oriented x3, not in any acute distress. CVS: S1-S2 clear, no murmur rubs or gallops Chest: clear to auscultation bilaterally Abdomen: soft nontender, nondistended, normal bowel sounds  Extremities: no cyanosis, clubbing or edema noted bilaterally GU: + Foley  Lab Results: Basic Metabolic Panel:  Recent Labs Lab 11/28/12 0247 11/29/12 0622  NA 139 139  K 3.7  3.9  CL 100 103  CO2 25 27  GLUCOSE 133* 99  BUN 15 22  CREATININE 0.73 1.00  CALCIUM 9.8 9.3   Liver Function Tests:  Recent Labs Lab 11/28/12 0247  AST 23  ALT 20  ALKPHOS 53  BILITOT 0.4  PROT 7.9  ALBUMIN 4.5   No results found for this basename: LIPASE, AMYLASE,  in the last 168 hours No results found for this basename: AMMONIA,  in the last 168 hours CBC:  Recent Labs Lab 11/28/12 0247 11/29/12 0622  WBC 9.6 9.4  NEUTROABS 8.0*  --   HGB 12.8 11.6*  HCT 36.9 34.4*  MCV 85.2 87.1  PLT 176 170   Cardiac Enzymes: No results found for this basename: CKTOTAL, CKMB, CKMBINDEX, TROPONINI,  in the last 168 hours BNP: No components found with this basename: POCBNP,  CBG: No results found for this basename: GLUCAP,  in the last 168 hours   Micro Results: No results found for this or any previous visit (from the past 240 hour(s)).  Studies/Results: No results found.  Medications: Scheduled Meds: . aspirin  81 mg Oral Daily  . atorvastatin  40 mg Oral q1800  . lisinopril  40 mg Oral Daily  . loratadine  10 mg Oral Daily  . montelukast  10 mg Oral QHS  . multivitamin with minerals  1 tablet Oral Daily  . omega-3 acid ethyl esters  2 g Oral BID  . pantoprazole  40 mg Oral Daily  . senna  1  tablet Oral Daily      LOS: 1 day   RAI,RIPUDEEP M.D. [facility] 11/29/2012, 11:55 AM Pager: [phone]  If 7PM-7AM, please contact night-coverage [email or web address] Password [email or web address]

## 2012-11-29 NOTE — Progress Notes (Signed)
Patient ID: Lynn Dixon, female   DOB: 06/27/1950, 62 y.o.   MRN: [id number] Myelo pending. Off zoloft. Continue pain control

## 2012-11-29 NOTE — Progress Notes (Signed)
Called Dr Cassandria Santee office re; myelogram rescheduled and does he still want procedure to be performed.  Per radiology, pt's zoloft was not d/c'd x48hrs before procedure, last dose given Sunday 8/17 @ 10am.  Dr Cassandria Santee input needed before NPO status discontinued for myelogram.  Family notified of these changes; not very happy.  Nsg to continue to monitor for status changes.

## 2012-11-29 NOTE — Progress Notes (Signed)
Patient ID: Lynn Dixon, female   DOB: December 12, 1950, 62 y.o.   MRN: [id number] Stable. For myelo in am

## 2012-11-29 NOTE — Progress Notes (Signed)
Pt had c/o pain and burning upon f/c entry.  Lower abdomen was distended and hard.  Small amount of blood in urine in f/c.  Pt output of urine has been less than for shift.  MD notified, new orders; discontinue foley, increase NS to 182ml/hr and repeat U/A.  After foley cath discontinued, pt felt relieved and was able to void.  Nsg to continue to monitor for status changes.

## 2012-11-30 ENCOUNTER — Inpatient Hospital Stay (HOSPITAL_COMMUNITY): Payer: Commercial Indemnity

## 2012-11-30 DIAGNOSIS — N390 Urinary tract infection, site not specified: Secondary | ICD-10-CM | POA: Diagnosis present

## 2012-11-30 LAB — URINE CULTURE
Colony Count: NO GROWTH
Culture: NO GROWTH

## 2012-11-30 MED ORDER — IOHEXOL 180 MG/ML  SOLN
20.0000 mL | Freq: Once | INTRAMUSCULAR | Status: AC | PRN
  Administered 2012-11-30: 16 mL via INTRATHECAL

## 2012-11-30 NOTE — Progress Notes (Signed)
Patient ID: Lynn Dixon  female  [id number]    DOB: 06/29/1950    DOA: 11/28/2012  PCP: [name], MD  Assessment/Plan: Principal Problem: Acute lumbar back pain  - Patient had MRI done 3 days prior to admission in [place]. - Per verbal report from radiology Department [facility].  MRI report: postop changes with posterior fusion L2-L3, status post laminectomy L4-L5. L1-L2 disc desiccation and diffuse disc bulging. Moderate right and severe left foraminal narrowing with mild central canal stenosis.  -Myelogram and lumbar CT today, neurosurgery following - Cont pain control, bed rest   Urinary retention with UTI: - Feels a whole lot better today after discontinuing Foley last night - Follow urine culture, continue Rocephin IV  HTN (hypertension), benign  - Continue lisinopril   Other and unspecified hyperlipidemia:  - Continue Lipitor   COPD (chronic obstructive pulmonary disease):  -Currently stable, place on albuterol nebs as needed   Dehydration: improving - Continue PPI and IV fluid hydration   DVT Prophylaxis:  Code Status:  Disposition:    Subjective:  Feels better, able to go up to bathroom by herself, urine output improving,  Objective: Weight change:   Intake/Output Summary (Last 24 hours) at 11/30/12 1309 Last data filed at 11/30/12 0656  Gross per 24 hour  Intake   1200 ml  Output    575 ml  Net    625 ml   Blood pressure 134/68, pulse 59, temperature 97.4 F (36.3 C), temperature source Oral, resp. rate 16, SpO2 97.00%.  Physical Exam: General: Ax O x3 CVS: S1-S2 clear, no murmur rubs or gallops Chest: CTAB Abdomen: soft NT, ND, NBS Extremities: no c/c/e bilaterally   Lab Results: Basic Metabolic Panel:  Recent Labs Lab 11/28/12 0247 11/29/12 0622  NA 139 139  K 3.7 3.9  CL 100 103  CO2 25 27  GLUCOSE 133* 99  BUN 15 22  CREATININE 0.73 1.00  CALCIUM 9.8 9.3   Liver Function Tests:  Recent Labs Lab  11/28/12 0247  AST 23  ALT 20  ALKPHOS 53  BILITOT 0.4  PROT 7.9  ALBUMIN 4.5   No results found for this basename: LIPASE, AMYLASE,  in the last 168 hours No results found for this basename: AMMONIA,  in the last 168 hours CBC:  Recent Labs Lab 11/28/12 0247 11/29/12 0622  WBC 9.6 9.4  NEUTROABS 8.0*  --   HGB 12.8 11.6*  HCT 36.9 34.4*  MCV 85.2 87.1  PLT 176 170   Cardiac Enzymes: No results found for this basename: CKTOTAL, CKMB, CKMBINDEX, TROPONINI,  in the last 168 hours BNP: No components found with this basename: POCBNP,  CBG: No results found for this basename: GLUCAP,  in the last 168 hours   Micro Results: Recent Results (from the past 240 hour(s))  URINE CULTURE     Status: None   Collection Time    11/28/12  7:35 PM      Result Value Range Status   Specimen Description URINE, CATHETERIZED   Final   Special Requests NONE   Final   Culture  Setup Time     Final   Value: 11/29/2012 01:09     Performed at [facility]     Final   Value: NO GROWTH     Performed at [facility]   Culture     Final   Value: NO GROWTH     Performed at [facility]   Report Status 11/30/2012 FINAL   Final    Studies/Results: No results found.  Medications: Scheduled Meds: . aspirin  81 mg Oral Daily  . atorvastatin  40 mg Oral q1800  . cefTRIAXone (ROCEPHIN)  IV  1 g Intravenous Q24H  . lisinopril  40 mg Oral Daily  . loratadine  10 mg Oral Daily  . montelukast  10 mg Oral QHS  . multivitamin with minerals  1 tablet Oral Daily  . omega-3 acid ethyl esters  2 g Oral BID  . pantoprazole  40 mg Oral Daily  . senna  1 tablet Oral Daily      LOS: 2 days   Selia Wareing M.D. [facility] 11/30/2012, 1:09 PM Pager: [phone]  If 7PM-7AM, please contact night-coverage [email or web address] Password [email or web address]

## 2012-11-30 NOTE — Procedures (Signed)
Following betadine prep and anesthetizing the superficial tissues with 1% lidocaine a 22g needle was advanced to the subarachnoid space at L5-S1.  16 ml Omnipaque 180 was injected.  Please see Radiology report for further detail.  No complications.

## 2012-11-30 NOTE — Progress Notes (Signed)
Pt. Has home CPAP unit. Checked water chamber per pt. Request. Pt. Is able to placed herself on/off as needed.

## 2012-12-01 ENCOUNTER — Other Ambulatory Visit: Payer: Self-pay | Admitting: Neurosurgery

## 2012-12-01 ENCOUNTER — Encounter (HOSPITAL_COMMUNITY): Payer: Self-pay | Admitting: General Practice

## 2012-12-01 MED ORDER — CEFAZOLIN SODIUM-DEXTROSE 2-3 GM-% IV SOLR
2.0000 g | INTRAVENOUS | Status: AC
  Administered 2012-12-03: 2 g via INTRAVENOUS
  Filled 2012-12-01: qty 50

## 2012-12-01 NOTE — Progress Notes (Signed)
Patient ID: Lynn Dixon, female   DOB: [birth year], 62 y.o.   MRN: [id number] Spoke with patient and daughter. 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 Gave the choices of conservative treatment vs surgery. They want to go ahead with surgery either tomorrow or Friday. Will look at the schedule

## 2012-12-01 NOTE — Progress Notes (Signed)
Patient ID: Lynn Dixon  female  [id number]    DOB: 01-10-1951    DOA: 11/28/2012  PCP: [name], MD  Assessment/Plan: Principal Problem: Acute lumbar back pain  - Patient had MRI done 3 days prior to admission in [place]. Per verbal report from radiology Department from [facility].  MRI report: postop changes with posterior fusion L2-L3, status post laminectomy L4-L5. L1-L2 disc desiccation and diffuse disc bulging. Moderate right and severe left foraminal narrowing with mild central canal stenosis.  - Myelogram showed broad-based disc bulging at L1-L2, broad-based disc herniation at L4-L5 exaggerated upon standing, early truncation of traversing left L5 nerve root likely etiology of patient's pain. - Neurosurgery following, planning surgery tomorrow or on Friday  Urinary retention with UTI: - Follow urine culture, continue Rocephin IV  HTN (hypertension), benign  - Continue lisinopril   Other and unspecified hyperlipidemia:  - Continue Lipitor   COPD (chronic obstructive pulmonary disease):  -Currently stable, place on albuterol nebs as needed   Dehydration: Resolved, KVO  DVT Prophylaxis:  Code Status:  Disposition:    Subjective:  Daughter at the bedside, patient feels a whole lot better now, able to walk to the bathroom  Objective: Weight change:   Intake/Output Summary (Last 24 hours) at 12/01/12 1337 Last data filed at 12/01/12 1300  Gross per 24 hour  Intake   2040 ml  Output    400 ml  Net   1640 ml   Blood pressure 153/77, pulse 64, temperature 98.1 F (36.7 C), temperature source Oral, resp. rate 18, SpO2 97.00%.  Physical Exam: General: Ax O x3 CVS: S1-S2 clear Chest: CTAB Abdomen: soft NT, ND, NBS Extremities: no c/c/e bilaterally   Lab Results: Basic Metabolic Panel:  Recent Labs Lab 11/28/12 0247 11/29/12 0622  NA 139 139  K 3.7 3.9  CL 100 103  CO2 25 27  GLUCOSE 133* 99  BUN 15 22  CREATININE 0.73 1.00   CALCIUM 9.8 9.3   Liver Function Tests:  Recent Labs Lab 11/28/12 0247  AST 23  ALT 20  ALKPHOS 53  BILITOT 0.4  PROT 7.9  ALBUMIN 4.5   No results found for this basename: LIPASE, AMYLASE,  in the last 168 hours No results found for this basename: AMMONIA,  in the last 168 hours CBC:  Recent Labs Lab 11/28/12 0247 11/29/12 0622  WBC 9.6 9.4  NEUTROABS 8.0*  --   HGB 12.8 11.6*  HCT 36.9 34.4*  MCV 85.2 87.1  PLT 176 170   Cardiac Enzymes: No results found for this basename: CKTOTAL, CKMB, CKMBINDEX, TROPONINI,  in the last 168 hours BNP: No components found with this basename: POCBNP,  CBG: No results found for this basename: GLUCAP,  in the last 168 hours   Micro Results: Recent Results (from the past 240 hour(s))  URINE CULTURE     Status: None   Collection Time    11/28/12  7:35 PM      Result Value Range Status   Specimen Description URINE, CATHETERIZED   Final   Special Requests NONE   Final   Culture  Setup Time     Final   Value: 11/29/2012 01:09     Performed at [facility]     Final   Value: NO GROWTH     Performed at [facility]   Culture     Final   Value: NO GROWTH     Performed at  [facility]   Report Status 11/30/2012 FINAL   Final  URINE CULTURE     Status: None   Collection Time    11/29/12  6:40 PM      Result Value Range Status   Specimen Description URINE, RANDOM   Final   Special Requests NONE   Final   Culture  Setup Time     Final   Value: 11/29/2012 20:20     Performed at [facility] PENDING   Incomplete   Culture     Final   Value: Culture reincubated for better growth     Performed at [facility]   Report Status PENDING   Incomplete    Studies/Results: No results found.  Medications: Scheduled Meds: . aspirin  81 mg Oral Daily  . atorvastatin  40 mg Oral q1800  . cefTRIAXone (ROCEPHIN)  IV  1 g Intravenous Q24H  . lisinopril  40 mg Oral  Daily  . loratadine  10 mg Oral Daily  . montelukast  10 mg Oral QHS  . multivitamin with minerals  1 tablet Oral Daily  . omega-3 acid ethyl esters  2 g Oral BID  . pantoprazole  40 mg Oral Daily  . senna  1 tablet Oral Daily      LOS: 3 days   RAI,RIPUDEEP M.D. [facility] 12/01/2012, 1:37 PM Pager: [phone]  If 7PM-7AM, please contact night-coverage [email or web address] Password [email or web address]

## 2012-12-02 LAB — URINE CULTURE: Colony Count: >=100000

## 2012-12-02 LAB — SURGICAL PCR SCREEN
MRSA, PCR: NEGATIVE
Staphylococcus aureus: NEGATIVE

## 2012-12-02 MED ORDER — OMEPRAZOLE 20 MG PO CPDR
40.0000 mg | DELAYED_RELEASE_CAPSULE | Freq: Every day | ORAL | Status: DC
  Administered 2012-12-02 – 2012-12-04 (×3): 40 mg via ORAL
  Filled 2012-12-02 (×3): qty 2

## 2012-12-02 NOTE — Progress Notes (Signed)
Patient ID: Lynn Dixon  female  [id number]    DOB: 09-09-1950    DOA: 11/28/2012  PCP: [name], MD  Assessment/Plan: Principal Problem: Acute lumbar back pain  - Patient had MRI done 3 days prior to admission in [place]. Per verbal report from radiology Department from [facility]  MRI report: postop changes with posterior fusion L2-L3, status post laminectomy L4-L5. L1-L2 disc desiccation and diffuse disc bulging. Moderate right and severe left foraminal narrowing with mild central canal stenosis.  - Myelogram showed broad-based disc bulging at L1-L2, broad-based disc herniation at L4-L5 exaggerated upon standing, early truncation of traversing left L5 nerve root likely etiology of patient's pain. - Neurosurgery following, d/w Dr Jeral Fruit, after the patient changed her mind from epidural inj to having the surgery. Per Dr.Botero, will plan surgery tomorrow. N.p.o. after midnight  Klebsiella pneumonia UTI : -Sensitive to Rocephin, continue  HTN (hypertension), benign  - Continue lisinopril   Other and unspecified hyperlipidemia:  - Continue Lipitor   COPD (chronic obstructive pulmonary disease):  -Currently stable, place on albuterol nebs as needed   Dehydration: Resolved, KVO  DVT Prophylaxis: SCDs  Code Status:  Disposition:    Subjective:  Patient and daughter both overwhelmed at the time of my encounter, the patient changed her mind and wanted to have the surgery done. Pain controlled at the moment.  Objective: Weight change:   Intake/Output Summary (Last 24 hours) at 12/02/12 1357 Last data filed at 12/02/12 0900  Gross per 24 hour  Intake      0 ml  Output   1950 ml  Net  -1950 ml   Blood pressure 120/50, pulse 64, temperature 98.6 F (37 C), temperature source Oral, resp. rate 18, SpO2 98.00%.  Physical Exam: General: Ax O x3 CVS: S1-S2 clear Chest: CTAB Abdomen: soft NT, ND, NBS Extremities: no c/c/e bilaterally   Lab  Results: Basic Metabolic Panel:  Recent Labs Lab 11/28/12 0247 11/29/12 0622  NA 139 139  K 3.7 3.9  CL 100 103  CO2 25 27  GLUCOSE 133* 99  BUN 15 22  CREATININE 0.73 1.00  CALCIUM 9.8 9.3   Liver Function Tests:  Recent Labs Lab 11/28/12 0247  AST 23  ALT 20  ALKPHOS 53  BILITOT 0.4  PROT 7.9  ALBUMIN 4.5   No results found for this basename: LIPASE, AMYLASE,  in the last 168 hours No results found for this basename: AMMONIA,  in the last 168 hours CBC:  Recent Labs Lab 11/28/12 0247 11/29/12 0622  WBC 9.6 9.4  NEUTROABS 8.0*  --   HGB 12.8 11.6*  HCT 36.9 34.4*  MCV 85.2 87.1  PLT 176 170   Cardiac Enzymes: No results found for this basename: CKTOTAL, CKMB, CKMBINDEX, TROPONINI,  in the last 168 hours BNP: No components found with this basename: POCBNP,  CBG: No results found for this basename: GLUCAP,  in the last 168 hours   Micro Results: Recent Results (from the past 240 hour(s))  URINE CULTURE     Status: None   Collection Time    11/28/12  7:35 PM      Result Value Range Status   Specimen Description URINE, CATHETERIZED   Final   Special Requests NONE   Final   Culture  Setup Time     Final   Value: 11/29/2012 01:09     Performed at [facility]     Final   Value: NO  GROWTH     Performed at [facility]   Culture     Final   Value: NO GROWTH     Performed at [facility]   Report Status 11/30/2012 FINAL   Final  URINE CULTURE     Status: None   Collection Time    11/29/12  6:40 PM      Result Value Range Status   Specimen Description URINE, RANDOM   Final   Special Requests NONE   Final   Culture  Setup Time     Final   Value: 11/29/2012 20:20     Performed at [facility]     Final   Value: >=100,000 COLONIES/ML     Performed at [facility]   Culture     Final   Value: KLEBSIELLA PNEUMONIAE     Note: Two isolates with different morphologies were  identified as the same organism.The most resistant organism was reported.     Performed at [facility]   Report Status 12/02/2012 FINAL   Final   Organism ID, Bacteria KLEBSIELLA PNEUMONIAE   Final  SURGICAL PCR SCREEN     Status: None   Collection Time    12/02/12  6:49 AM      Result Value Range Status   MRSA, PCR NEGATIVE  NEGATIVE Final   Staphylococcus aureus NEGATIVE  NEGATIVE Final   Comment:            The Xpert SA Assay (FDA     approved for NASAL specimens     in patients over 73 years of age),     is one component of     a comprehensive surveillance     program.  Test performance has     been validated by [employer] for patients greater     than or equal to 9 year old.     It is not intended     to diagnose infection nor to     guide or monitor treatment.    Studies/Results: No results found.  Medications: Scheduled Meds: . aspirin  81 mg Oral Daily  . atorvastatin  40 mg Oral q1800  . [START ON 12/03/2012]  ceFAZolin (ANCEF) IV  2 g Intravenous On Call to OR  . cefTRIAXone (ROCEPHIN)  IV  1 g Intravenous Q24H  . lisinopril  40 mg Oral Daily  . loratadine  10 mg Oral Daily  . montelukast  10 mg Oral QHS  . multivitamin with minerals  1 tablet Oral Daily  . omega-3 acid ethyl esters  2 g Oral BID  . omeprazole  40 mg Oral Daily  . senna  1 tablet Oral Daily      LOS: 4 days   RAI,RIPUDEEP M.D. [facility] 12/02/2012, 1:57 PM Pager: [phone]  If 7PM-7AM, please contact night-coverage [email or web address] Password [email or web address]

## 2012-12-02 NOTE — Progress Notes (Signed)
Patient ID: Lynn Dixon, female   DOB: 31-May-1950, 62 y.o.   MRN: [id number] i HAD A LONG TALK WITH PATIENT AND daughter. Patient is receptive about conservative treatment including epidurals but daughter wants her to proceed wwith surgery. At the end i told them is [name] THE ONE WITH THE PROBLEM AND ONLY HER HAS TO DECIDE ABOUT TREATMENT. IF SHE WANTS TO STAY AWAY FROM SURGERY I WILL TALK TO RADIOLOGY ABOUT DOING AN EPIDURAL INJECTION AT LEFT L4-5

## 2012-12-03 ENCOUNTER — Encounter (HOSPITAL_COMMUNITY)
Admission: EM | Disposition: A | Discharge status: Home Health | Payer: Self-pay | Source: Home / Self Care | Attending: Internal Medicine

## 2012-12-03 ENCOUNTER — Inpatient Hospital Stay (HOSPITAL_COMMUNITY): Payer: Commercial Indemnity

## 2012-12-03 ENCOUNTER — Encounter (HOSPITAL_COMMUNITY): Payer: Self-pay | Admitting: *Deleted

## 2012-12-03 ENCOUNTER — Encounter (HOSPITAL_COMMUNITY): Payer: Self-pay | Admitting: Anesthesiology

## 2012-12-03 ENCOUNTER — Inpatient Hospital Stay (HOSPITAL_COMMUNITY): Payer: Commercial Indemnity | Admitting: Anesthesiology

## 2012-12-03 HISTORY — PX: LUMBAR LAMINECTOMY/DECOMPRESSION MICRODISCECTOMY: SHX5026

## 2012-12-03 SURGERY — LUMBAR LAMINECTOMY/DECOMPRESSION MICRODISCECTOMY 1 LEVEL
Anesthesia: General | Site: Back | Laterality: Left | Wound class: Clean

## 2012-12-03 MED ORDER — ZOLPIDEM TARTRATE 5 MG PO TABS: 5.0000 mg | ORAL_TABLET | Freq: Every evening | ORAL | Status: DC | PRN

## 2012-12-03 MED ORDER — NALOXONE HCL 0.4 MG/ML IJ SOLN: 0.4000 mg | INTRAMUSCULAR | Status: DC | PRN

## 2012-12-03 MED ORDER — ONDANSETRON HCL 4 MG/2ML IJ SOLN: 4.0000 mg | INTRAMUSCULAR | Status: DC | PRN

## 2012-12-03 MED ORDER — LACTATED RINGERS IV SOLN
INTRAVENOUS | Status: DC | PRN
  Administered 2012-12-03 (×2): via INTRAVENOUS

## 2012-12-03 MED ORDER — PHENOL 1.4 % MT LIQD: 1.0000 | OROMUCOSAL | Status: DC | PRN

## 2012-12-03 MED ORDER — SODIUM CHLORIDE 0.9 % IJ SOLN: 3.0000 mL | INTRAMUSCULAR | Status: DC | PRN

## 2012-12-03 MED ORDER — BUPIVACAINE-EPINEPHRINE PF 0.5-1:200000 % IJ SOLN
INTRAMUSCULAR | Status: DC | PRN
  Administered 2012-12-03: 10 mL

## 2012-12-03 MED ORDER — ROCURONIUM BROMIDE 100 MG/10ML IV SOLN
INTRAVENOUS | Status: DC | PRN
  Administered 2012-12-03: 40 mg via INTRAVENOUS

## 2012-12-03 MED ORDER — SODIUM CHLORIDE 0.9 % IJ SOLN: 9.0000 mL | INTRAMUSCULAR | Status: DC | PRN

## 2012-12-03 MED ORDER — PROPOFOL 10 MG/ML IV BOLUS
INTRAVENOUS | Status: DC | PRN
  Administered 2012-12-03: 160 mg via INTRAVENOUS

## 2012-12-03 MED ORDER — DIPHENHYDRAMINE HCL 50 MG/ML IJ SOLN: 12.5000 mg | Freq: Four times a day (QID) | INTRAMUSCULAR | Status: DC | PRN

## 2012-12-03 MED ORDER — SODIUM CHLORIDE 0.9 % IV SOLN: 250.0000 mL | INTRAVENOUS | Status: DC

## 2012-12-03 MED ORDER — OXYCODONE HCL 5 MG/5ML PO SOLN: 5.0000 mg | Freq: Once | ORAL | Status: AC | PRN

## 2012-12-03 MED ORDER — BISACODYL 5 MG PO TBEC: 5.0000 mg | DELAYED_RELEASE_TABLET | Freq: Every day | ORAL | Status: DC | PRN

## 2012-12-03 MED ORDER — GLYCOPYRROLATE 0.2 MG/ML IJ SOLN
INTRAMUSCULAR | Status: DC | PRN
  Administered 2012-12-03: 0.6 mg via INTRAVENOUS

## 2012-12-03 MED ORDER — ACETAMINOPHEN 325 MG PO TABS: 650.0000 mg | ORAL_TABLET | ORAL | Status: DC | PRN

## 2012-12-03 MED ORDER — DIPHENHYDRAMINE HCL 12.5 MG/5ML PO ELIX: 12.5000 mg | ORAL_SOLUTION | Freq: Four times a day (QID) | ORAL | Status: DC | PRN

## 2012-12-03 MED ORDER — OXYCODONE HCL 5 MG PO TABS: 5.0000 mg | ORAL_TABLET | Freq: Once | ORAL | Status: AC | PRN

## 2012-12-03 MED ORDER — DIAZEPAM 5 MG PO TABS
5.0000 mg | ORAL_TABLET | Freq: Four times a day (QID) | ORAL | Status: DC | PRN
  Administered 2012-12-03 – 2012-12-04 (×2): 5 mg via ORAL
  Filled 2012-12-03 (×2): qty 1

## 2012-12-03 MED ORDER — FENTANYL CITRATE 0.05 MG/ML IJ SOLN
INTRAMUSCULAR | Status: DC | PRN
Start: 2012-12-03 — End: 2012-12-03
  Administered 2012-12-03: 100 ug via INTRAVENOUS
  Administered 2012-12-03 (×3): 50 ug via INTRAVENOUS

## 2012-12-03 MED ORDER — HYDROMORPHONE HCL PF 1 MG/ML IJ SOLN
0.2500 mg | INTRAMUSCULAR | Status: DC | PRN
  Administered 2012-12-03: 0.5 mg via INTRAVENOUS

## 2012-12-03 MED ORDER — CEFAZOLIN SODIUM 1-5 GM-% IV SOLN
1.0000 g | Freq: Three times a day (TID) | INTRAVENOUS | Status: AC
  Administered 2012-12-03 – 2012-12-04 (×2): 1 g via INTRAVENOUS
  Filled 2012-12-03 (×2): qty 50

## 2012-12-03 MED ORDER — MENTHOL 3 MG MT LOZG
1.0000 | LOZENGE | OROMUCOSAL | Status: DC | PRN
  Filled 2012-12-03: qty 9

## 2012-12-03 MED ORDER — NEOSTIGMINE METHYLSULFATE 1 MG/ML IJ SOLN
INTRAMUSCULAR | Status: DC | PRN
  Administered 2012-12-03: 5 mg via INTRAVENOUS

## 2012-12-03 MED ORDER — METHYLPREDNISOLONE ACETATE 80 MG/ML IJ SUSP
INTRAMUSCULAR | Status: DC | PRN
  Administered 2012-12-03: 80 mg

## 2012-12-03 MED ORDER — SODIUM CHLORIDE 0.9 % IV SOLN
INTRAVENOUS | Status: DC
  Administered 2012-12-03: 22:00:00 via INTRAVENOUS

## 2012-12-03 MED ORDER — ONDANSETRON HCL 4 MG/2ML IJ SOLN: 4.0000 mg | Freq: Once | INTRAMUSCULAR | Status: AC | PRN

## 2012-12-03 MED ORDER — PHENYTOIN SODIUM 50 MG/ML IJ SOLN
INTRAMUSCULAR | Status: AC
  Filled 2012-12-03: qty 2

## 2012-12-03 MED ORDER — HYDROMORPHONE HCL PF 1 MG/ML IJ SOLN
INTRAMUSCULAR | Status: AC
  Administered 2012-12-03: 0.5 mg via INTRAVENOUS
  Filled 2012-12-03: qty 1

## 2012-12-03 MED ORDER — LIDOCAINE HCL (CARDIAC) 20 MG/ML IV SOLN
INTRAVENOUS | Status: DC | PRN
  Administered 2012-12-03: 30 mg via INTRAVENOUS

## 2012-12-03 MED ORDER — MIDAZOLAM HCL 5 MG/5ML IJ SOLN
INTRAMUSCULAR | Status: DC | PRN
  Administered 2012-12-03: 2 mg via INTRAVENOUS

## 2012-12-03 MED ORDER — SODIUM CHLORIDE 0.9 % IV SOLN: INTRAVENOUS | Status: DC

## 2012-12-03 MED ORDER — THROMBIN 5000 UNITS EX SOLR
CUTANEOUS | Status: DC | PRN
  Administered 2012-12-03 (×2): 5000 [IU] via TOPICAL

## 2012-12-03 MED ORDER — ACETAMINOPHEN 650 MG RE SUPP: 650.0000 mg | RECTAL | Status: DC | PRN

## 2012-12-03 MED ORDER — HEMOSTATIC AGENTS (NO CHARGE) OPTIME
TOPICAL | Status: DC | PRN
  Administered 2012-12-03: 1 via TOPICAL

## 2012-12-03 MED ORDER — 0.9 % SODIUM CHLORIDE (POUR BTL) OPTIME
TOPICAL | Status: DC | PRN
  Administered 2012-12-03: 1000 mL

## 2012-12-03 MED ORDER — ONDANSETRON HCL 4 MG/2ML IJ SOLN
INTRAMUSCULAR | Status: DC | PRN
  Administered 2012-12-03: 4 mg via INTRAVENOUS

## 2012-12-03 MED ORDER — ONDANSETRON HCL 4 MG/2ML IJ SOLN: 4.0000 mg | Freq: Four times a day (QID) | INTRAMUSCULAR | Status: DC | PRN

## 2012-12-03 MED ORDER — EPHEDRINE SULFATE 50 MG/ML IJ SOLN
INTRAMUSCULAR | Status: DC | PRN
  Administered 2012-12-03: 5 mg via INTRAVENOUS
  Administered 2012-12-03: 15 mg via INTRAVENOUS
  Administered 2012-12-03: 20 mg via INTRAVENOUS

## 2012-12-03 MED ORDER — HYDROMORPHONE 0.3 MG/ML IV SOLN
INTRAVENOUS | Status: AC
  Filled 2012-12-03: qty 25

## 2012-12-03 MED ORDER — HYDROMORPHONE 0.3 MG/ML IV SOLN
INTRAVENOUS | Status: DC
Start: 2012-12-03 — End: 2012-12-04

## 2012-12-03 MED ORDER — SODIUM CHLORIDE 0.9 % IJ SOLN
3.0000 mL | Freq: Two times a day (BID) | INTRAMUSCULAR | Status: DC
  Administered 2012-12-03: 3 mL via INTRAVENOUS

## 2012-12-03 SURGICAL SUPPLY — 55 items
APL SKNCLS STERI-STRIP NONHPOA (GAUZE/BANDAGES/DRESSINGS) ×1
BENZOIN TINCTURE PRP APPL 2/3 (GAUZE/BANDAGES/DRESSINGS) ×2 IMPLANT
BLADE SURG ROTATE 9660 (MISCELLANEOUS) IMPLANT
BUR ACORN 6.0 (BURR) ×2 IMPLANT
BUR MATCHSTICK NEURO 3.0 LAGG (BURR) IMPLANT
CANISTER SUCTION 2500CC (MISCELLANEOUS) ×2 IMPLANT
CLOTH BEACON ORANGE TIMEOUT ST (SAFETY) ×2 IMPLANT
CONT SPEC 4OZ CLIKSEAL STRL BL (MISCELLANEOUS) ×2 IMPLANT
DRAPE LAPAROTOMY 100X72X124 (DRAPES) ×2 IMPLANT
DRAPE MICROSCOPE LEICA (MISCELLANEOUS) ×2 IMPLANT
DRAPE POUCH INSTRU U-SHP 10X18 (DRAPES) ×2 IMPLANT
DRSG PAD ABDOMINAL 8X10 ST (GAUZE/BANDAGES/DRESSINGS) IMPLANT
DURAPREP 26ML APPLICATOR (WOUND CARE) ×2 IMPLANT
ELECT REM PT RETURN 9FT ADLT (ELECTROSURGICAL) ×2
ELECTRODE REM PT RTRN 9FT ADLT (ELECTROSURGICAL) ×1 IMPLANT
GAUZE SPONGE 4X4 16PLY XRAY LF (GAUZE/BANDAGES/DRESSINGS) IMPLANT
GLOVE BIOGEL M 8.0 STRL (GLOVE) ×2 IMPLANT
GLOVE ECLIPSE 6.5 STRL STRAW (GLOVE) ×1 IMPLANT
GLOVE EXAM NITRILE LRG STRL (GLOVE) IMPLANT
GLOVE EXAM NITRILE MD LF STRL (GLOVE) ×1 IMPLANT
GLOVE EXAM NITRILE XL STR (GLOVE) IMPLANT
GLOVE EXAM NITRILE XS STR PU (GLOVE) IMPLANT
GOWN BRE IMP SLV AUR LG STRL (GOWN DISPOSABLE) ×3 IMPLANT
GOWN BRE IMP SLV AUR XL STRL (GOWN DISPOSABLE) ×1 IMPLANT
GOWN STRL REIN 2XL LVL4 (GOWN DISPOSABLE) IMPLANT
KIT BASIN OR (CUSTOM PROCEDURE TRAY) ×2 IMPLANT
KIT ROOM TURNOVER OR (KITS) ×2 IMPLANT
NDL HYPO 18GX1.5 BLUNT FILL (NEEDLE) IMPLANT
NDL HYPO 21X1.5 SAFETY (NEEDLE) IMPLANT
NDL HYPO 25X1 1.5 SAFETY (NEEDLE) IMPLANT
NDL SPNL 20GX3.5 QUINCKE YW (NEEDLE) IMPLANT
NEEDLE HYPO 18GX1.5 BLUNT FILL (NEEDLE) IMPLANT
NEEDLE HYPO 21X1.5 SAFETY (NEEDLE) IMPLANT
NEEDLE HYPO 25X1 1.5 SAFETY (NEEDLE) IMPLANT
NEEDLE SPNL 20GX3.5 QUINCKE YW (NEEDLE) IMPLANT
NS IRRIG 1000ML POUR BTL (IV SOLUTION) ×2 IMPLANT
PACK LAMINECTOMY NEURO (CUSTOM PROCEDURE TRAY) ×2 IMPLANT
PAD ARMBOARD 7.5X6 YLW CONV (MISCELLANEOUS) ×6 IMPLANT
PATTIES SURGICAL .5 X1 (DISPOSABLE) ×2 IMPLANT
RUBBERBAND STERILE (MISCELLANEOUS) ×4 IMPLANT
SPONGE GAUZE 4X4 12PLY (GAUZE/BANDAGES/DRESSINGS) ×2 IMPLANT
SPONGE LAP 4X18 X RAY DECT (DISPOSABLE) IMPLANT
SPONGE SURGIFOAM ABS GEL SZ50 (HEMOSTASIS) ×2 IMPLANT
STRIP CLOSURE SKIN 1/2X4 (GAUZE/BANDAGES/DRESSINGS) ×2 IMPLANT
SUT VIC AB 0 CT1 18XCR BRD8 (SUTURE) ×1 IMPLANT
SUT VIC AB 0 CT1 8-18 (SUTURE) ×2
SUT VIC AB 2-0 CP2 18 (SUTURE) ×2 IMPLANT
SUT VIC AB 3-0 SH 8-18 (SUTURE) ×2 IMPLANT
SYR 20CC LL (SYRINGE) IMPLANT
SYR 20ML ECCENTRIC (SYRINGE) ×2 IMPLANT
SYR 5ML LL (SYRINGE) IMPLANT
TAPE CLOTH SURG 4X10 WHT LF (GAUZE/BANDAGES/DRESSINGS) ×1 IMPLANT
TOWEL OR 17X24 6PK STRL BLUE (TOWEL DISPOSABLE) ×2 IMPLANT
TOWEL OR 17X26 10 PK STRL BLUE (TOWEL DISPOSABLE) ×2 IMPLANT
WATER STERILE IRR 1000ML POUR (IV SOLUTION) ×2 IMPLANT

## 2012-12-03 NOTE — Anesthesia Preprocedure Evaluation (Addendum)
Anesthesia Evaluation  Patient identified by MRN, date of birth, ID band Patient awake    Reviewed: Allergy & Precautions, H&P , NPO status , Patient's Chart, lab work & pertinent test results  Airway Mallampati: II TM Distance: >3 FB Neck ROM: Full    Dental  (+) Partial Lower, Partial Upper and Dental Advisory Given   Pulmonary shortness of breath and with exertion, sleep apnea and Continuous Positive Airway Pressure Ventilation , COPD breath sounds clear to auscultation        Cardiovascular hypertension, Pt. on medications Rhythm:Regular Rate:Normal     Neuro/Psych Depression    GI/Hepatic hiatal hernia, GERD-  Medicated and Controlled,  Endo/Other  Morbid obesity  Renal/GU      Musculoskeletal   Abdominal   Peds  Hematology   Anesthesia Other Findings   Reproductive/Obstetrics                          Anesthesia Physical Anesthesia Plan  ASA: III  Anesthesia Plan: General   Post-op Pain Management:    Induction: Intravenous  Airway Management Planned: Oral ETT  Additional Equipment:   Intra-op Plan:   Post-operative Plan:   Informed Consent: I have reviewed the patients History and Physical, chart, labs and discussed the procedure including the risks, benefits and alternatives for the proposed anesthesia with the patient or authorized representative who has indicated his/her understanding and acceptance.   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

## 2012-12-03 NOTE — Anesthesia Procedure Notes (Signed)
Procedure Name: Intubation Date/Time: 12/03/2012 3:18 PM Performed by: Rogelia Boga Pre-anesthesia Checklist: Patient identified, Emergency Drugs available, Suction available, Patient being monitored and Timeout performed Patient Re-evaluated:Patient Re-evaluated prior to inductionOxygen Delivery Method: Circle system utilized Preoxygenation: Pre-oxygenation with 100% oxygen Intubation Type: IV induction Ventilation: Mask ventilation without difficulty and Oral airway inserted - appropriate to patient size Laryngoscope Size: Mac and 4 Grade View: Grade II Tube type: Oral Tube size: 7.5 mm Number of attempts: 1 Airway Equipment and Method: Stylet Placement Confirmation: ETT inserted through vocal cords under direct vision,  positive ETCO2 and breath sounds checked- equal and bilateral Secured at: 21 cm Tube secured with: Tape Dental Injury: Teeth and Oropharynx as per pre-operative assessment

## 2012-12-03 NOTE — Progress Notes (Signed)
Op note [id number]

## 2012-12-03 NOTE — Progress Notes (Signed)
Patient ID: Lynn Dixon  female  JYN:[id number]    DOB: 1951/02/11    DOA: 11/28/2012  PCP: [name], MD  HPI Patient is a 62 year old female with history of hypertension, hyperlipidemia, COPD, had prior 2 back surgeries performed by Dr. Jeral Fruit presented to the ED acute back pain radiating down her left leg. History was obtained from the patient who reported that she actually had a fall a month ago when she was cleaning her bathtub and her hand slipped and she fell on the floor on her buttocks. She was having lower back pain and went to her PCP ordered x-rays which according to the patient were normal and was ordered physical therapy. Per patient, she has been going to physical therapy for last 2 weeks which worsened her pain. In the last 1 week, her pain worsened and radiating down to the left leg. She was not able to ambulate with the left leg due to the pain.    Consult:  Neurosurgery: Dr Jeral Fruit  Assessment/Plan: Principal Problem: Acute lumbar back pain  - Patient had MRI done 3 days prior to admission in [place]. Per verbal report from radiology Department from [facility].  MRI report: postop changes with posterior fusion L2-L3, status post laminectomy L4-L5. L1-L2 disc desiccation and diffuse disc bulging. Moderate right and severe left foraminal narrowing with mild central canal stenosis.  - Myelogram showed broad-based disc bulging at L1-L2, broad-based disc herniation at L4-L5 exaggerated upon standing, early truncation of traversing left L5 nerve root likely etiology of patient's pain. - Neurosurgery following, d/w Dr Jeral Fruit, awaiting surgery today - PT eval after the surgery to decide the disposition  Klebsiella pneumonia UTI : -Sensitive to Rocephin, continue  HTN (hypertension), benign  - Continue lisinopril   Other and unspecified hyperlipidemia:  - Continue Lipitor   COPD (chronic obstructive pulmonary disease):  -Currently stable, place on albuterol  nebs as needed   Dehydration: Resolved, KVO  DVT Prophylaxis: SCDs  Code Status:  Disposition: will check PT eval after surgery today    Subjective:  Patient doing better, resting with CPAP on. Awaiting surgery today   Objective: Weight change:   Intake/Output Summary (Last 24 hours) at 12/03/12 0724 Last data filed at 12/03/12 0524  Gross per 24 hour  Intake   1440 ml  Output   1400 ml  Net     40 ml   Blood pressure 138/83, pulse 61, temperature 97.9 F (36.6 C), temperature source Oral, resp. rate 18, SpO2 99.00%.  Physical Exam: General: Ax O x3 CVS: S1-S2 clear Chest: CTAB Abdomen: soft NT, ND, NBS Extremities: no c/c/e bilaterally   Lab Results: Basic Metabolic Panel:  Recent Labs Lab 11/28/12 0247 11/29/12 0622  NA 139 139  K 3.7 3.9  CL 100 103  CO2 25 27  GLUCOSE 133* 99  BUN 15 22  CREATININE 0.73 1.00  CALCIUM 9.8 9.3   Liver Function Tests:  Recent Labs Lab 11/28/12 0247  AST 23  ALT 20  ALKPHOS 53  BILITOT 0.4  PROT 7.9  ALBUMIN 4.5   CBC:  Recent Labs Lab 11/28/12 0247 11/29/12 0622  WBC 9.6 9.4  NEUTROABS 8.0*  --   HGB 12.8 11.6*  HCT 36.9 34.4*  MCV 85.2 87.1  PLT 176 170     Micro Results: Recent Results (from the past 240 hour(s))  URINE CULTURE     Status: None   Collection Time    11/28/12  7:35 PM  Result Value Range Status   Specimen Description URINE, CATHETERIZED   Final   Special Requests NONE   Final   Culture  Setup Time     Final   Value: 11/29/2012 01:09     Performed at [facility]     Final   Value: NO GROWTH     Performed at [facility]   Culture     Final   Value: NO GROWTH     Performed at [facility]   Report Status 11/30/2012 FINAL   Final  URINE CULTURE     Status: None   Collection Time    11/29/12  6:40 PM      Result Value Range Status   Specimen Description URINE, RANDOM   Final   Special Requests NONE   Final   Culture  Setup  Time     Final   Value: 11/29/2012 20:20     Performed at [facility]     Final   Value: >=100,000 COLONIES/ML     Performed at [facility]   Culture     Final   Value: KLEBSIELLA PNEUMONIAE     Note: Two isolates with different morphologies were identified as the same organism.The most resistant organism was reported.     Performed at [facility]   Report Status 12/02/2012 FINAL   Final   Organism ID, Bacteria KLEBSIELLA PNEUMONIAE   Final  SURGICAL PCR SCREEN     Status: None   Collection Time    12/02/12  6:49 AM      Result Value Range Status   MRSA, PCR NEGATIVE  NEGATIVE Final   Staphylococcus aureus NEGATIVE  NEGATIVE Final   Comment:            The Xpert SA Assay (FDA     approved for NASAL specimens     in patients over 67 years of age),     is one component of     a comprehensive surveillance     program.  Test performance has     been validated by [employer] for patients greater     than or equal to 24 year old.     It is not intended     to diagnose infection nor to     guide or monitor treatment.    Studies/Results: No results found.  Medications: Scheduled Meds: . aspirin  81 mg Oral Daily  . atorvastatin  40 mg Oral q1800  .  ceFAZolin (ANCEF) IV  2 g Intravenous On Call to OR  . cefTRIAXone (ROCEPHIN)  IV  1 g Intravenous Q24H  . lisinopril  40 mg Oral Daily  . loratadine  10 mg Oral Daily  . montelukast  10 mg Oral QHS  . multivitamin with minerals  1 tablet Oral Daily  . omega-3 acid ethyl esters  2 g Oral BID  . omeprazole  40 mg Oral Daily  . senna  1 tablet Oral Daily      LOS: 5 days   Miyoshi Ligas M.D. [facility] 12/03/2012, 7:24 AM Pager: [phone]  If 7PM-7AM, please contact night-coverage [email or web address] Password [email or web address]

## 2012-12-03 NOTE — Preoperative (Signed)
Beta Blockers   Reason not to administer Beta Blockers:Not Applicable 

## 2012-12-03 NOTE — Transfer of Care (Signed)
Immediate Anesthesia Transfer of Care Note  Patient: Lynn Dixon  Procedure(s) Performed: Procedure(s) with comments: Left Lumbar Four to Five Diskectomy (Left) - LUMBAR LAMINECTOMY/DECOMPRESSION MICRODISCECTOMY 1 LEVEL  Patient Location: PACU  Anesthesia Type:General  Level of Consciousness: awake  Airway & Oxygen Therapy: Patient Spontanous Breathing and Patient connected to face mask oxygen  Post-op Assessment: Report given to PACU RN and Post -op Vital signs reviewed and stable  Post vital signs: Reviewed and stable  Complications: No apparent anesthesia complications

## 2012-12-04 DIAGNOSIS — N390 Urinary tract infection, site not specified: Secondary | ICD-10-CM

## 2012-12-04 DIAGNOSIS — M48061 Spinal stenosis, lumbar region without neurogenic claudication: Secondary | ICD-10-CM | POA: Diagnosis present

## 2012-12-04 MED ORDER — DOCUSATE SODIUM 100 MG PO CAPS: 100.0000 mg | ORAL_CAPSULE | Freq: Two times a day (BID) | ORAL | Status: DC

## 2012-12-04 MED ORDER — HYDROCODONE-ACETAMINOPHEN 5-325 MG PO TABS: 2.0000 | ORAL_TABLET | Freq: Four times a day (QID) | ORAL | Status: DC | PRN

## 2012-12-04 MED ORDER — CIPROFLOXACIN HCL 500 MG PO TABS: 500.0000 mg | ORAL_TABLET | Freq: Two times a day (BID) | ORAL | Status: DC

## 2012-12-04 NOTE — Discharge Summary (Signed)
Physician Discharge Summary  Lynn Dixon:[id number] DOB: [birth year] DOA: 11/28/2012  PCP: [name], MD  Admit date: 11/28/2012 Discharge date: 12/04/2012  Time spent: 40 minutes  Recommendations for Outpatient Follow-up:  Home with outpt PT Follow up with PCP and  neurosx in 1 week   Discharge Diagnoses:  Principal Problem:   Foraminal stenosis of lumbar region  Active Problems:   Acute lumbar back pain   Left leg weakness   HTN (hypertension), benign   Other and unspecified hyperlipidemia   COPD (chronic obstructive pulmonary disease)   Dehydration   UTI (urinary tract infection)   Discharge Condition: fair  Diet recommendation: low sodium  There were no vitals filed for this visit.  History of present illness:   Please refer to admission H&P for details, but in brief, 62 year old female with history of hypertension, hyperlipidemia, COPD, had prior 2 back surgeries performed by Dr. Jeral Fruit presented to the ED acute back pain radiating down her left leg. History was obtained from the patient who reported that she actually had a fall a month ago when she was cleaning her bathtub and her hand slipped and she fell on the floor on her buttocks. She was having lower back pain and went to her PCP ordered x-rays which according to the patient were normal and was ordered physical therapy. Per patient, she has been going to physical therapy for last 2 weeks which worsened her pain. In the last 1 week, her pain worsened and radiating down to the left leg. She was not able to ambulate with the left leg due to the pain.  Hospital Course:  Acute lumbar back pain  - Patient had MRI done 3 days prior to admission in [place]. Per verbal report from radiology Department from [facility]. MRI report: postop changes with posterior fusion L2-L3, status post laminectomy L4-L5. L1-L2 disc desiccation and diffuse disc bulging. Moderate right and severe left foraminal narrowing  with mild central canal stenosis.  - Myelogram showed broad-based disc bulging at L1-L2, broad-based disc herniation at L4-L5 exaggerated upon standing, early truncation of traversing left L5 nerve root likely etiology of patient's pain.  - Neurosurgery following patient. Patient underwent  Left L4-L5 laminotomy, lysis of adhesions, foraminotomy to decompress the L4 and L5 nerve roots. Removal of large fragment of disk compromising the takeoff of L5. -tolerated sx well. On prn oral pain meds. Seen by PT . She has been ambulatory post surgery and pain much improved. Recommends outpt PT.  Klebsiella pneumonia UTI  -Sensitive to Rocephin, will discharge on oral cipro to complete 7 day course  HTN (hypertension), benign  - Continue lisinopril   Other and unspecified hyperlipidemia:  - Continue Lipitor   COPD (chronic obstructive pulmonary disease):  -Currently stable       Procedures: 8/22: Left L4-L5 laminotomy, lysis of adhesions, foraminotomy to  decompress the L4 and L5 nerve roots. Removal of large fragment of disk  compromising the takeoff of L5.   Consultations:  Dr Jeral Fruit ( neurosurgery)  Discharge Exam: Filed Vitals:   12/04/12 0800  BP:   Pulse:   Temp:   Resp: 18    General: elderly femael in NAD HEENT: no pallor, moist oral mucosa Chest: clear b/l,no added sounds CVS: NS1&S2, no murmurs Abd: soft, NT, ND, bs+ EXT: Dressing over low back with minimal tenderness NS: AAOX3, Non focal   Discharge Instructions     Medication List         aspirin 81 MG  chewable tablet  Chew 81 mg by mouth daily.     CALCIUM PO  Take 1 tablet by mouth daily.     ciprofloxacin 500 MG tablet  Commonly known as:  CIPRO  Take 1 tablet (500 mg total) by mouth 2 (two) times daily. Until 8/24     docusate sodium 100 MG capsule  Commonly known as:  COLACE  Take 1 capsule (100 mg total) by mouth 2 (two) times daily.     fish oil-omega-3 fatty acids 1000 MG capsule  Take  2 g by mouth 2 (two) times daily.     GLUCOSAMINE-CHONDROITIN PO  Take 1 tablet by mouth daily.     HYDROcodone-acetaminophen 5-325 MG per tablet  Commonly known as:  NORCO/VICODIN  Take 2 tablets by mouth every 6 (six) hours as needed for pain.     lisinopril 40 MG tablet  Commonly known as:  PRINIVIL,ZESTRIL  Take 40 mg by mouth daily.     loratadine 10 MG tablet  Commonly known as:  CLARITIN  Take 10 mg by mouth daily.     montelukast 10 MG tablet  Commonly known as:  SINGULAIR  Take 10 mg by mouth at bedtime.     multivitamin with minerals Tabs tablet  Take 1 tablet by mouth daily.     omeprazole 20 MG capsule  Commonly known as:  PRILOSEC  Take 40 mg by mouth daily.     senna 8.6 MG Tabs tablet  Commonly known as:  SENOKOT  Take 1 tablet by mouth daily.     sertraline 100 MG tablet  Commonly known as:  ZOLOFT  Take 100 mg by mouth daily.     simvastatin 80 MG tablet  Commonly known as:  ZOCOR  Take 40 mg by mouth at bedtime.     temazepam 15 MG capsule  Commonly known as:  RESTORIL  Take 15 mg by mouth at bedtime as needed for sleep.       Allergies  Allergen Reactions  . Coconut Oil Anaphylaxis  . Morphine And Related Other (See Comments)    unknown  . Estrogens Rash       Follow-up Information   Follow up with [name], MD In 1 week.   Specialty:  Family Medicine   Contact information:   [address] [place] [address] [phone]       Follow up with Karn Cassis, MD. Schedule an appointment as soon as possible for a visit in 1 week.   Specialty:  Neurosurgery   Contact information:   [address] [place] [address] [phone]        The results of significant diagnostics from this hospitalization (including imaging, microbiology, ancillary and laboratory) are listed below for reference.    Significant Diagnostic Studies: Ct Lumbar Spine W Contrast  11/30/2012   *RADIOLOGY  REPORT*  Clinical Data: Back pain.  Status post previous lumbar surgeries.  Left lower extremity pain extending along the lateral margin of the left leg. Radiculitis.  MYELOGRAM INJECTION  Technique:  Informed consent was obtained from the patient prior to the procedure, including potential complications of headache, allergy, infection and pain.  A timeout procedure was performed. With the patient prone, the lower back was prepped with Betadine. 1% Lidocaine was used for local anesthesia.  Lumbar puncture was performed at the right paramidline L5-S1 level using a 22 gauge needle with return of clear CSF.  16  ml of Omnipaque 180was injected into the subarachnoid space .  IMPRESSION: Successful injection of  intrathecal contrast for myelography.  MYELOGRAM LUMBAR  Technique: I personally performed the lumbar puncture and administered the intrathecal contrast. I also personally supervised acquisition of the myelogram images. Following injection of intrathecal Omnipaque contrast, spine imaging in multiple projections was performed using fluoroscopy.  Fluoroscopy Time: 1 minute 58 seconds  Comparison:  Lumbar spine radiographs 10/07/2012.  Findings: The patient is status post PLIF at L2-3.  Leftward curvature of lumbar spine is centered at L3-4 with asymmetric osteophyte formation on the right.  Despite laminectomy is there is mild to moderate central canal stenosis at L3-4 with right greater than left lateral recess narrowing.  There is an early truncation of the left L5 nerve root at the L4-5 level which could account for the patient's symptoms.  There is also some medial displacement of the traversing s one nerve root.  Medial deviation is present on the right at L4-5 without early truncation.  There is no significant stenosis at the L2-3 level or L1-2 level.  Slight retrolisthesis with a vacuum disc is present at to L3-4.  A broad-based disc bulge is present at L4-5.  There is a broad-based disc herniation at L1-2 as  well.  The disc herniation at L4-5 is worse upon standing.  Alignment is stable.  There is no abnormal motion with flexion or extension.  IMPRESSION:  1.  Status post PLIF at to L2-3 without significant stenosis.  2.  Broad-based disc bulging at L1-2 without significant stenosis. 3.  Focal levoconvex scoliosis at the adjacent level, L3-4 with mild to moderate central canal stenosis, right greater than left. 4.  Broad-based disc herniation at L4-5 is exaggerated upon standing. 5.  Early truncation of the traversing left L5 nerve root at the L4- 5 level is the most likely etiology of the patient's pain.  *RADIOLOGY REPORT*  CT MYELOGRAPHY LUMBAR SPINE  Technique:  CT imaging of the lumbar spine was performed after intrathecal contrast administration.  Multiplanar CT image reconstructions were also generated.  Findings:  The lumbar spine is imaged from midbody of T12-S3. Conus medullaris terminates at L1, within normal limits.  The patient is status post L2-3 PLIF with solid inner body fusion. Vacuum disc is present at L3-4 with slight retrolisthesis. Alignment is otherwise anatomic.  Limited imaging of the abdomen demonstrate surgical clips the gallbladder fossa.  Atherosclerotic calcifications are present in the aorta and branch vessels without aneurysm.  T12-L1:  Negative.  L1-2:  A leftward disc protrusion and asymmetric left-sided facet hypertrophy results and mild left lateral recess and foraminal stenosis.  L2-3:  The patient is status post fusion.  No residual or recurrent stenosis is evident.  L3-4:  There is significant loss of disc height and slight retrolisthesis.  A broad-based disc herniation is present. Advanced facet hypertrophy is evident.  Mild lateral recess narrowing is worse on the right.  Moderate foraminal stenosis is worse on the right, predominately due to facet disease.  L4-5:  A prominent left paracentral disc protrusion and slight inferior extrusion extends into the left lateral recess,  displacing the traversing left L5 nerve root.  Mild to moderate foraminal stenosis bilaterally is worse on the left.  This is secondary to facet arthropathy.  L5-S1:  Advanced facet arthropathy is evident bilaterally.  A mild central disc protrusion is noted.  Facet hypertrophy contributes to mild left foraminal stenosis.  Slight right lateral recess narrowing is evident.  IMPRESSION:  1.  Moderate to severe left lateral recess narrowing at L4-5 secondary to a focal left paracentral disc protrusion and inferior extrusion likely affects the traversing left L5 nerve root to, correlating with the patient's symptoms. 2.  Status post PLIF at L2-3 without significant stenosis at that level. 3.  Mild left lateral recess and foraminal stenosis at L1-2. 4.  Mild lateral recess and moderate foraminal stenosis bilaterally at L3-4 is worse on the right. 5.  Focal levoconvex scoliosis at L3-4 with asymmetric right-sided facet hypertrophy and disc herniation. 6.  Mild to moderate foraminal stenosis bilaterally at L4-5 is worse on the left. 7.  Mild left foraminal and slight right lateral recess narrowing at L5-S1.   Original Report Authenticated By: Marin Roberts, M.D.   Dg Myelogram Lumbar  11/30/2012   *RADIOLOGY REPORT*  Clinical Data: Back pain.  Status post previous lumbar surgeries.  Left lower extremity pain extending along the lateral margin of the left leg. Radiculitis.  MYELOGRAM INJECTION  Technique:  Informed consent was obtained from the patient prior to the procedure, including potential complications of headache, allergy, infection and pain.  A timeout procedure was performed. With the patient prone, the lower back was prepped with Betadine. 1% Lidocaine was used for local anesthesia.  Lumbar puncture was performed at the right paramidline L5-S1 level using a 22 gauge needle with return of clear CSF.  16 ml of Omnipaque 180was injected into the subarachnoid space .  IMPRESSION: Successful injection of   intrathecal contrast for myelography.  MYELOGRAM LUMBAR  Technique: I personally performed the lumbar puncture and administered the intrathecal contrast. I also personally supervised acquisition of the myelogram images. Following injection of intrathecal Omnipaque contrast, spine imaging in multiple projections was performed using fluoroscopy.  Fluoroscopy Time: 1 minute 58 seconds  Comparison:  Lumbar spine radiographs 10/07/2012.  Findings: The patient is status post PLIF at L2-3.  Leftward curvature of lumbar spine is centered at L3-4 with asymmetric osteophyte formation on the right.  Despite laminectomy is there is mild to moderate central canal stenosis at L3-4 with right greater than left lateral recess narrowing.  There is an early truncation of the left L5 nerve root at the L4-5 level which could account for the patient's symptoms.  There is also some medial displacement of the traversing s one nerve root.  Medial deviation is present on the right at L4-5 without early truncation.  There is no significant stenosis at the L2-3 level or L1-2 level.  Slight retrolisthesis with a vacuum disc is present at to L3-4.  A broad-based disc bulge is present at L4-5.  There is a broad-based disc herniation at L1-2 as well.  The disc herniation at L4-5 is worse upon standing.  Alignment is stable.  There is no abnormal motion with flexion or extension.  IMPRESSION:  1.  Status post PLIF at to L2-3 without significant stenosis.  2.  Broad-based disc bulging at L1-2 without significant stenosis. 3.  Focal levoconvex scoliosis at the adjacent level, L3-4 with mild to moderate central canal stenosis, right greater than left. 4.  Broad-based disc herniation at L4-5 is exaggerated upon standing. 5.  Early truncation of the traversing left L5 nerve root at the L4- 5 level is the most likely etiology of the patient's pain.  *RADIOLOGY REPORT*  CT MYELOGRAPHY LUMBAR SPINE  Technique:  CT imaging of the lumbar spine was performed  after intrathecal contrast administration.  Multiplanar CT image reconstructions were also generated.  Findings:  The  lumbar spine is imaged from midbody of T12-S3. Conus medullaris terminates at L1, within normal limits.  The patient is status post L2-3 PLIF with solid inner body fusion. Vacuum disc is present at L3-4 with slight retrolisthesis. Alignment is otherwise anatomic.  Limited imaging of the abdomen demonstrate surgical clips the gallbladder fossa.  Atherosclerotic calcifications are present in the aorta and branch vessels without aneurysm.  T12-L1:  Negative.  L1-2:  A leftward disc protrusion and asymmetric left-sided facet hypertrophy results and mild left lateral recess and foraminal stenosis.  L2-3:  The patient is status post fusion.  No residual or recurrent stenosis is evident.  L3-4:  There is significant loss of disc height and slight retrolisthesis.  A broad-based disc herniation is present. Advanced facet hypertrophy is evident.  Mild lateral recess narrowing is worse on the right.  Moderate foraminal stenosis is worse on the right, predominately due to facet disease.  L4-5:  A prominent left paracentral disc protrusion and slight inferior extrusion extends into the left lateral recess, displacing the traversing left L5 nerve root.  Mild to moderate foraminal stenosis bilaterally is worse on the left.  This is secondary to facet arthropathy.  L5-S1:  Advanced facet arthropathy is evident bilaterally.  A mild central disc protrusion is noted.  Facet hypertrophy contributes to mild left foraminal stenosis.  Slight right lateral recess narrowing is evident.  IMPRESSION:  1.  Moderate to severe left lateral recess narrowing at L4-5 secondary to a focal left paracentral disc protrusion and inferior extrusion likely affects the traversing left L5 nerve root to, correlating with the patient's symptoms. 2.  Status post PLIF at L2-3 without significant stenosis at that level. 3.  Mild left lateral  recess and foraminal stenosis at L1-2. 4.  Mild lateral recess and moderate foraminal stenosis bilaterally at L3-4 is worse on the right. 5.  Focal levoconvex scoliosis at L3-4 with asymmetric right-sided facet hypertrophy and disc herniation. 6.  Mild to moderate foraminal stenosis bilaterally at L4-5 is worse on the left. 7.  Mild left foraminal and slight right lateral recess narrowing at L5-S1.   Original Report Authenticated By: Marin Roberts, M.D.   Dg Lumbar Spine 1 View  12/03/2012   CLINICAL DATA:  L4-5 microdiscectomy.  EXAM: LUMBAR SPINE - 1 VIEW  COMPARISON:  Lumbar myelogram CT 11/30/2012. Radiographs 10/07/2012.  FINDINGS: Cross-table lateral view of the lumbar spine labeled # 1 at 1553 hr. Patient is status post pedicle screw and interbody fusion at L2-3. There are skin spreaders posteriorly at L4-5 with a blunt surgical instrument directed towards the L4-5 facet joints.  IMPRESSION: Intraoperative localization as described.   Electronically Signed   By: Roxy Horseman   On: 12/03/2012 17:34    Microbiology: Recent Results (from the past 240 hour(s))  URINE CULTURE     Status: None   Collection Time    11/28/12  7:35 PM      Result Value Range Status   Specimen Description URINE, CATHETERIZED   Final   Special Requests NONE   Final   Culture  Setup Time     Final   Value: 11/29/2012 01:09     Performed at [facility] Count     Final   Value: NO GROWTH     Performed at [facility]   Culture     Final   Value: NO GROWTH     Performed at [facility]   Report Status 11/30/2012 FINAL  Final  URINE CULTURE     Status: None   Collection Time    11/29/12  6:40 PM      Result Value Range Status   Specimen Description URINE, RANDOM   Final   Special Requests NONE   Final   Culture  Setup Time     Final   Value: 11/29/2012 20:20     Performed at [facility] Count     Final   Value: >=100,000 COLONIES/ML     Performed at  [facility]   Culture     Final   Value: KLEBSIELLA PNEUMONIAE     Note: Two isolates with different morphologies were identified as the same organism.The most resistant organism was reported.     Performed at [facility]   Report Status 12/02/2012 FINAL   Final   Organism ID, Bacteria KLEBSIELLA PNEUMONIAE   Final  SURGICAL PCR SCREEN     Status: None   Collection Time    12/02/12  6:49 AM      Result Value Range Status   MRSA, PCR NEGATIVE  NEGATIVE Final   Staphylococcus aureus NEGATIVE  NEGATIVE Final   Comment:            The Xpert SA Assay (FDA     approved for NASAL specimens     in patients over 79 years of age),     is one component of     a comprehensive surveillance     program.  Test performance has     been validated by [employer] for patients greater     than or equal to 87 year old.     It is not intended     to diagnose infection nor to     guide or monitor treatment.     Labs: Basic Metabolic Panel:  Recent Labs Lab 11/28/12 0247 11/29/12 0622  NA 139 139  K 3.7 3.9  CL 100 103  CO2 25 27  GLUCOSE 133* 99  BUN 15 22  CREATININE 0.73 1.00  CALCIUM 9.8 9.3   Liver Function Tests:  Recent Labs Lab 11/28/12 0247  AST 23  ALT 20  ALKPHOS 53  BILITOT 0.4  PROT 7.9  ALBUMIN 4.5   No results found for this basename: LIPASE, AMYLASE,  in the last 168 hours No results found for this basename: AMMONIA,  in the last 168 hours CBC:  Recent Labs Lab 11/28/12 0247 11/29/12 0622  WBC 9.6 9.4  NEUTROABS 8.0*  --   HGB 12.8 11.6*  HCT 36.9 34.4*  MCV 85.2 87.1  PLT 176 170   Cardiac Enzymes: No results found for this basename: CKTOTAL, CKMB, CKMBINDEX, TROPONINI,  in the last 168 hours BNP: BNP (last 3 results) No results found for this basename: PROBNP,  in the last 8760 hours CBG: No results found for this basename: GLUCAP,  in the last 168 hours     Signed:  Jontue Crumpacker  [facility] 12/04/2012,  1:11 PM

## 2012-12-04 NOTE — Op Note (Signed)
NAMESOMARA, Lynn Dixon                  ACCOUNT NO.:  [id number]  MEDICAL RECORD NO.:  [id number]  LOCATION:  [id number]                        FACILITY:  [facility]  PHYSICIAN:  Hilda Lias, M.D.   DATE OF BIRTH:  24-Aug-1950  DATE OF PROCEDURE:  12/03/2012 DATE OF DISCHARGE:                              OPERATIVE REPORT   PREOPERATIVE DIAGNOSES:  Left L4-L5 foraminal stenosis.  Herniated disk with chronic radiculopathy.  POSTOPERATIVE DIAGNOSES:  Left L4-L5 foraminal stenosis.  Herniated disk with chronic radiculopathy.  PROCEDURES:  Left L4-L5 laminotomy, lysis of adhesions, foraminotomy to decompress the L4 and L5 nerve roots.  Removal of large fragment of disk compromising the takeoff of L5.  Microscope.  SURGEON:  Hilda Lias, M.D.  ASSISTANT:  Coletta Memos, M.D.  CLINICAL HISTORY:  The patient had decompression of back pain and leg pain, but for the past 3 weeks, she has an acute onset of pain shooting down to the left leg.  She has failed with conservative treatment. Myelogram showed that she has multiple level degenerative disk disease including the fusion done by me at the level of L2-3 about 10 years ago. While she is standing, there is a large disk, which is more prominent in the left side.  The patient had been in the hospital for at least a week with no improvement with medication.  Surgery was advised.  DESCRIPTION OF PROCEDURE:  The patient was taken to the OR and after intubation, she was positioned on prone manner.  The back was cleaned with DuraPrep.  Drapes were applied.  The patient had multiple levels of scar tissue from previous surgery.  With the x-ray, we localized 4-5. An incision was made through the skin and subcutaneous tissue through a thick scar tissue straight down to the lumbar spine.  We brought the microscope into the area.  With the drill, we drilled the lower lamina of L4 and the upper of L5.  Lysis of adhesion was achieved.  Using the [place]  punch, foraminotomy to decompress the L4 and L5 nerve root was done.  Upon done this, which took [place] at least half an hour.  We lifted the takeoff of the L5 and right underneath, there was piece of fragment compromising the takeoff of the L5 nerve root.  Incision was made and two large fragments were removed.  There was a hole in the disk space. We entered the disk space and gross diskectomy was achieved.  At the end, we had a good decompression for the L4 and L5 nerve roots as well as the thecal sac.  Valsalva maneuver was negative.  Then, fentanyl and Depo-Medrol were left in the epidural space and the wound was closed with Vicryl and Steri-Strip.         ______________________________ Hilda Lias, M.D.    EB/MEDQ  D:  12/03/2012  T:  12/04/2012  Job:  [id number]

## 2012-12-04 NOTE — Progress Notes (Signed)
PCA discontinued per MD verbal order.

## 2012-12-04 NOTE — Progress Notes (Signed)
   CARE MANAGEMENT NOTE 12/04/2012  Patient:  Lynn Dixon, Lynn Dixon   Account Number:  [id number]  Date Initiated:  11/28/2012  Documentation initiated by:  Lanier Clam  Subjective/Objective Assessment:   62 y/o f admitted w/back pain.     Action/Plan:   PT/OT evals after surgery   Anticipated DC Date:  12/05/2012   Anticipated DC Plan:  HOME W HOME HEALTH SERVICES      DC Planning Services  CM consult      [facility] Choice  HOME HEALTH   Choice offered to / List presented to:  C-1 Patient           Status of service:  In process, will continue to follow Medicare Important Message given?   (If response is "NO", the following Medicare IM given date fields will be blank) Date Medicare IM given:   Date Additional Medicare IM given:    Discharge Disposition:    Per UR Regulation:  Reviewed for med. necessity/level of care/duration of stay  If discussed at Long Length of Stay Meetings, dates discussed:    Comments:  12/04/2012 0900 NCM spoke to pt and offered choice. States she has RW, cane and wheelchair at home. Husband, Baldo Ash at home to assist with care. Isidoro Donning RN CCM Case Mgmt phone [phone]

## 2012-12-04 NOTE — Evaluation (Signed)
Physical Therapy Evaluation Patient Details Name: Lynn Dixon MRN: [id number] DOB: 1951/04/07 Today's Date: 12/04/2012 Time: 1205-1217 PT Time Calculation (min): 12 min  PT Assessment / Plan / Recommendation History of Present Illness  Patient is a 62 year old female with history of  2 prior back surgeries performed by Dr. Jeral Fruit presented to the ED acute back pain radiating down her left leg. She was not able to ambulate with the left leg due to the pain. Underwent Left L4-L5 laminotomy, lysis of adhesions, foraminotomy to decompress the L4 and L5 nerve roots. Removal of large fragment of disk compromising the takeoff of L5.   Clinical Impression  Patient evaluated by Physical Therapy with no further acute PT needs identified. All education has been completed and the patient has no further questions. Patient is independent in all mobility.  May benefit from OP PT for high level balance and gait training, MD to decide at f/u visit. PT is signing off. Thank you for this referral.       PT Assessment  All further PT needs can be met in the next venue of care    Follow Up Recommendations  Outpatient PT    Does the patient have the potential to tolerate intense rehabilitation      Barriers to Discharge        Equipment Recommendations  None recommended by PT    Recommendations for Other Services     Frequency      Precautions / Restrictions Precautions Precautions: Back   Pertinent Vitals/Pain No pain per patient      Mobility  Bed Mobility Bed Mobility: Supine to Sit Supine to Sit: 7: Independent Transfers Transfers: Sit to Stand;Stand to Sit Sit to Stand: 7: Independent Stand to Sit: 7: Independent Ambulation/Gait Ambulation/Gait Assistance: 7: Independent Ambulation Distance (Feet): 150 Feet Assistive device: None Gait Pattern: Within Functional Limits    Exercises     PT Diagnosis: Acute pain  PT Problem List: Decreased knowledge of precautions (high level  balance and gait) PT Treatment Interventions:       PT Goals(Current goals can be found in the care plan section) Acute Rehab PT Goals PT Goal Formulation: No goals set, d/c therapy  Visit Information  Last PT Received On: 12/04/12 Assistance Needed: +1 History of Present Illness: Patient is a 62 year old female with history of  2 prior back surgeries performed by Dr. Jeral Fruit presented to the ED acute back pain radiating down her left leg. She was not able to ambulate with the left leg due to the pain. Underwent Left L4-L5 laminotomy, lysis of adhesions, foraminotomy to decompress the L4 and L5 nerve roots. Removal of large fragment of disk compromising the takeoff of L5.        Prior Functioning  Home Living Family/patient expects to be discharged to:: Private residence Available Help at Discharge: Family Type of Home: House Home Access: Stairs to enter Secretary/administrator of Steps: 4 Entrance Stairs-Rails: None Home Layout: One level Home Equipment: None Prior Function Level of Independence: Independent Communication Communication: No difficulties    Cognition  Cognition Arousal/Alertness: Awake/alert Behavior During Therapy: WFL for tasks assessed/performed Overall Cognitive Status: Within Functional Limits for tasks assessed    Extremity/Trunk Assessment Upper Extremity Assessment Upper Extremity Assessment: Overall WFL for tasks assessed Lower Extremity Assessment Lower Extremity Assessment: Overall WFL for tasks assessed Cervical / Trunk Assessment Cervical / Trunk Assessment: Normal   Balance Balance Balance Assessed: Yes (no apparent balance deficits) Static Standing Balance Static Standing -  Balance Support: No upper extremity supported Static Standing - Level of Assistance: 7: Independent  End of Session PT - End of Session Activity Tolerance: Patient tolerated treatment well Patient left: in bed;with call bell/phone within reach;with family/visitor  present Nurse Communication: Mobility status  GP     Olivia Canter, [place] [phone] 12/04/2012, 12:26 PM

## 2012-12-06 ENCOUNTER — Encounter (HOSPITAL_COMMUNITY): Payer: Self-pay | Admitting: Neurosurgery

## 2012-12-27 NOTE — Anesthesia Postprocedure Evaluation (Signed)
  Anesthesia Post-op Note  Patient: Lynn Dixon  Procedure(s) Performed: Procedure(s) with comments: Left Lumbar Four to Five Diskectomy (Left) - LUMBAR LAMINECTOMY/DECOMPRESSION MICRODISCECTOMY 1 LEVEL  Patient discharged with no apparent anesthetic complications

## 2014-07-01 IMAGING — RF DG MYELOGRAM LUMBAR
12 of 15 series · 12 of 15 positions shown · IV contrast (omnipaque)
Comparison: Lumbar spine radiographs 10/07/2012.

CLINICAL DATA: Back pain.  Status post previous lumbar surgeries.  Left lower
extremity pain extending along the lateral margin of the left leg.
Radiculitis.

MYELOGRAM INJECTION
TECHNIQUE: Informed consent was obtained from the patient prior to
the procedure, including potential complications of headache,
allergy, infection and pain.  A timeout procedure was performed.
With the patient prone, the lower back was prepped with Betadine.
1% Lidocaine was used for local anesthesia.  Lumbar puncture was
performed at the right paramidline L5-S1 level using a 22 gauge
needle with return of clear CSF.  16 ml of Omnipaque 433was
injected into the subarachnoid space .
TECHNIQUE: I personally performed the lumbar puncture and
administered the intrathecal contrast. I also personally supervised
acquisition of the myelogram images. Following injection of
intrathecal Omnipaque contrast, spine imaging in multiple
projections was performed using fluoroscopy.
Fluoroscopy Time: 1 minute 58 seconds
TECHNIQUE: CT imaging of the lumbar spine was performed after
intrathecal contrast administration.  Multiplanar CT image
reconstructions were also generated.

[Series 1: run · 1 of 1 slices shown (1 of 12)]
[im 1/1]
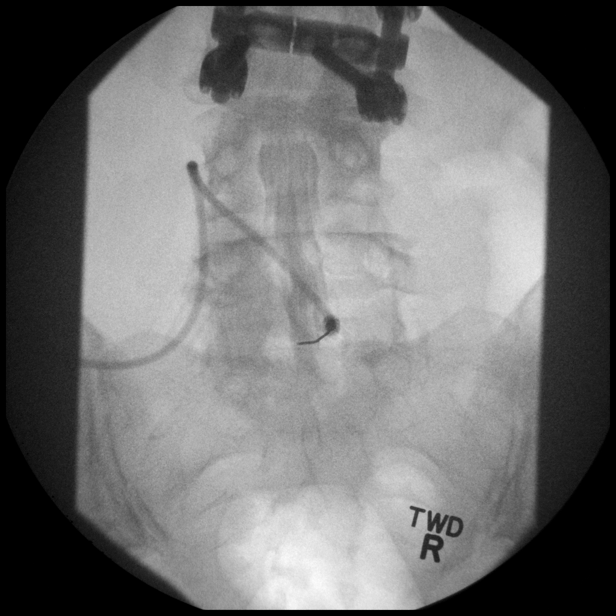

[Series 2: run · 1 of 1 slices shown (2 of 12)]
[im 1/1]
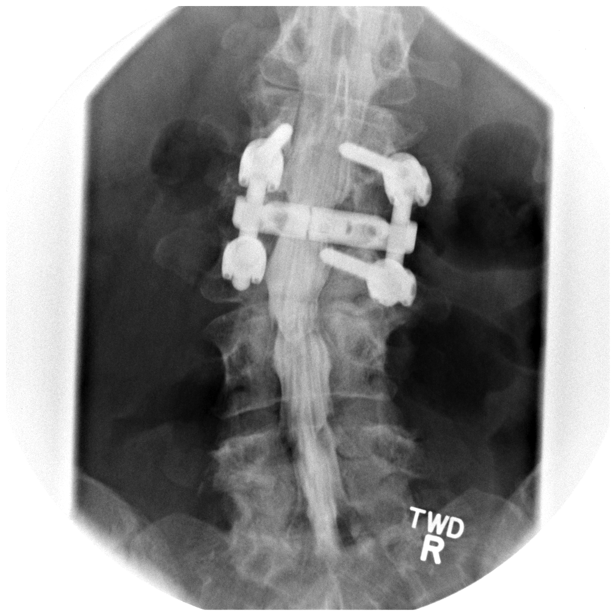

[Series 4: run · 1 of 1 slices shown (3 of 12)]
[im 1/1]
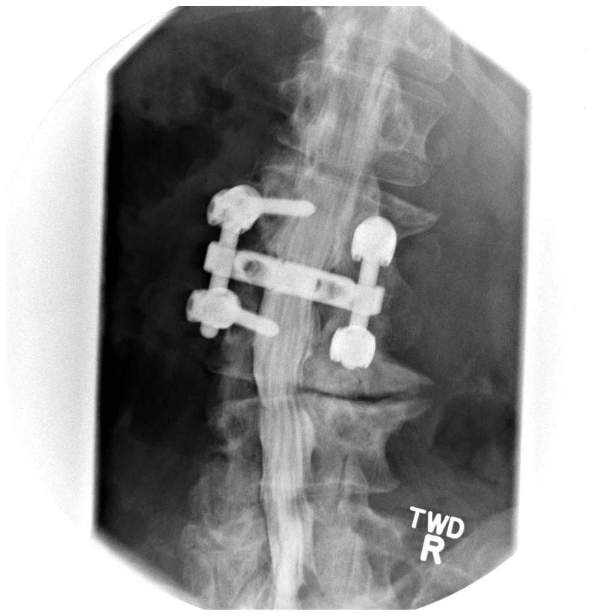

[Series 5: run · 1 of 1 slices shown (4 of 12)]
[im 1/1]
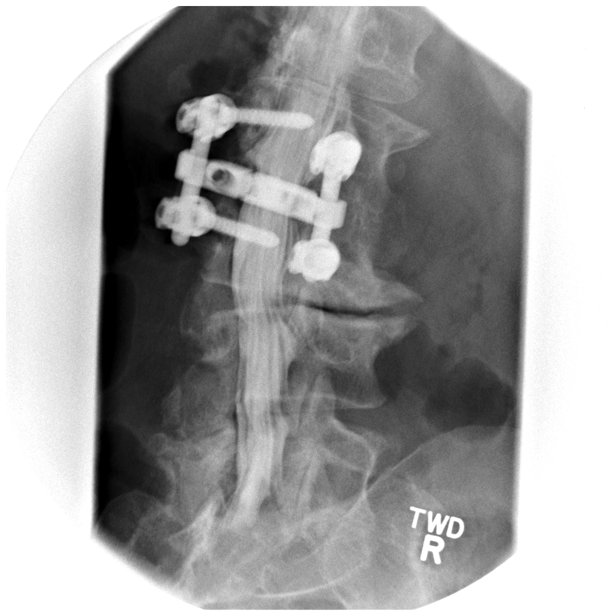

[Series 6: run · 1 of 1 slices shown (5 of 12)]
[im 1/1]
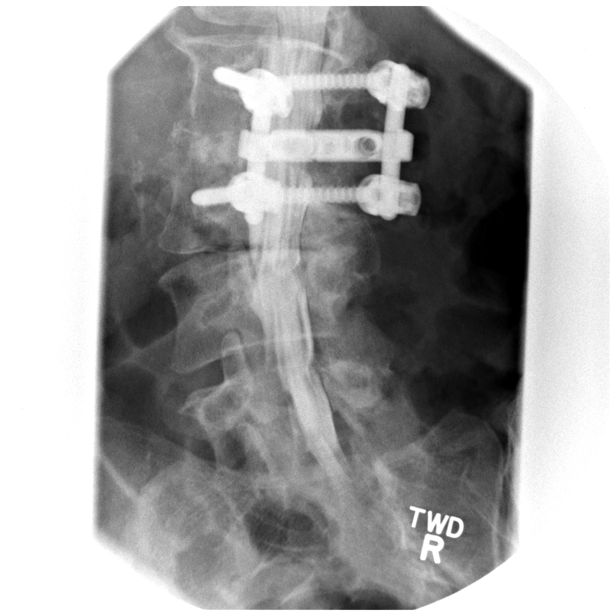

[Series 7: run · 1 of 1 slices shown (6 of 12)]
[im 1/1]
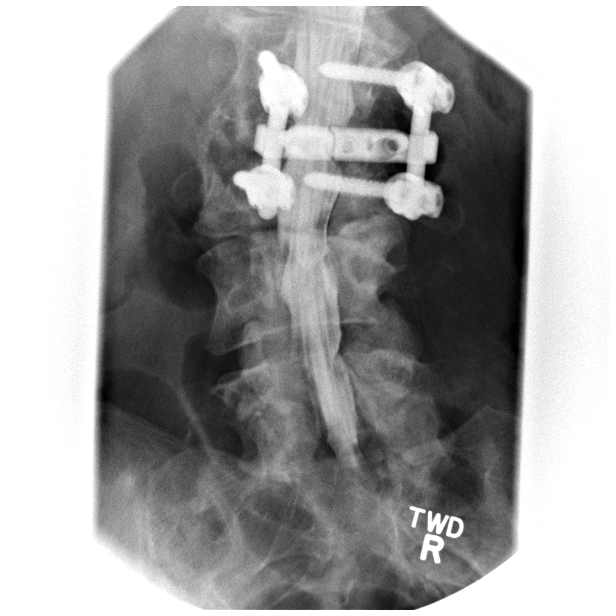

[Series 9: run · 1 of 1 slices shown (7 of 12)]
[im 1/1]
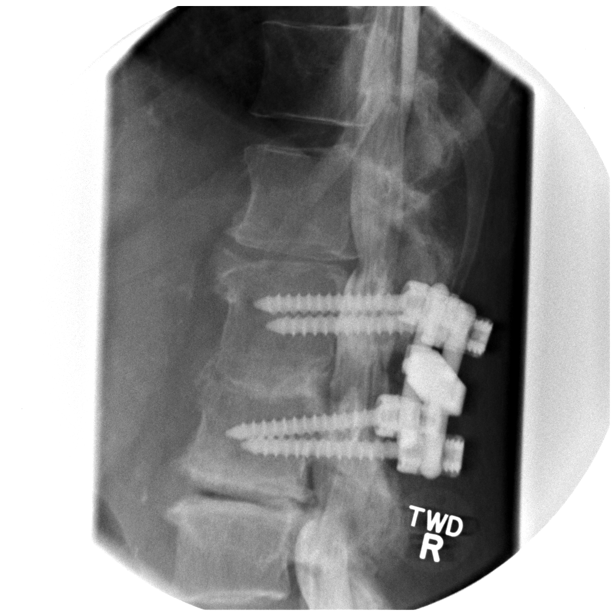

[Series 10: run · 1 of 1 slices shown (8 of 12)]
[im 1/1]
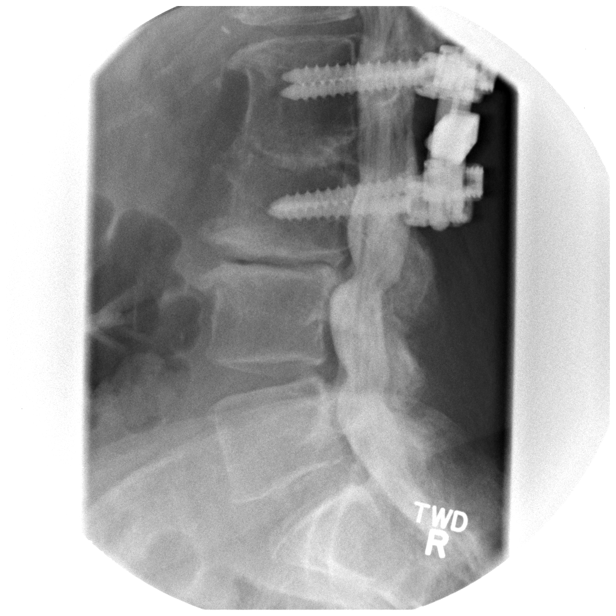

[Series 11: run · 1 of 1 slices shown (9 of 12)]
[im 1/1]
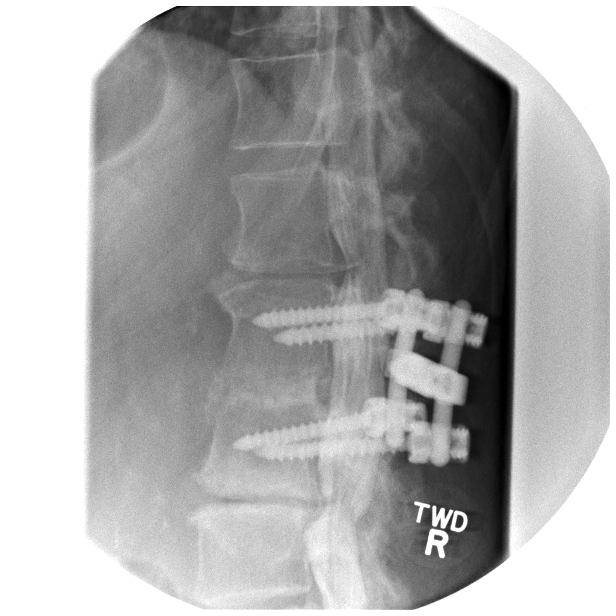

[Series 12: run · 1 of 1 slices shown (10 of 12)]
[im 1/1]
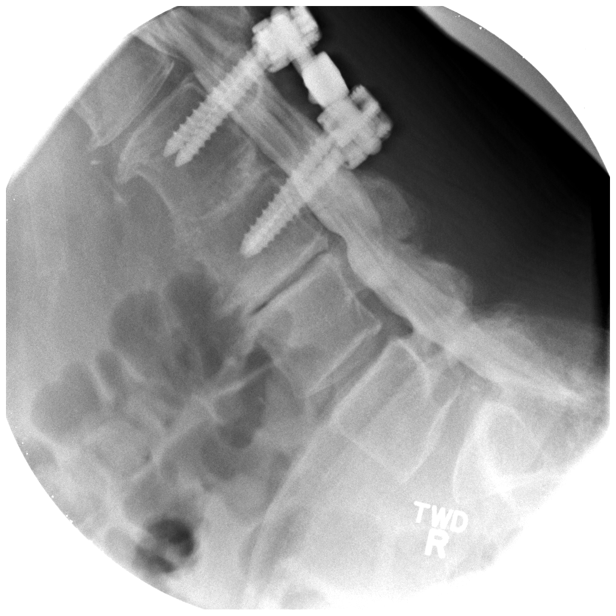

[Series 14: run · 1 of 1 slices shown (11 of 12)]
[im 1/1]
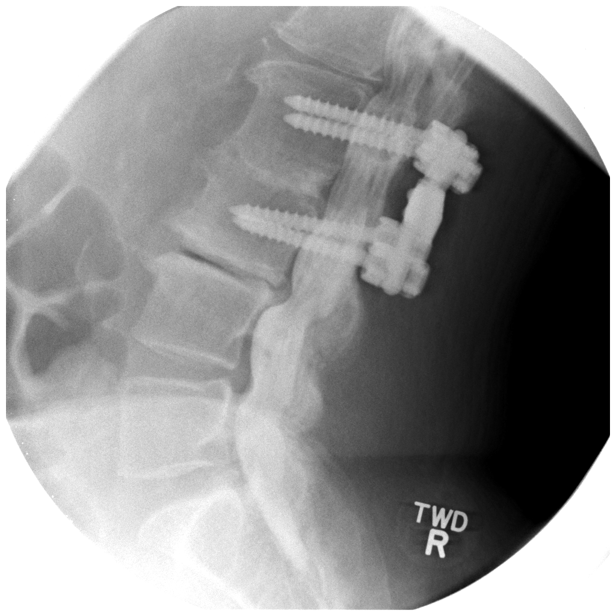

[Series 15: run · 1 of 1 slices shown (12 of 12)]
[im 1/1]
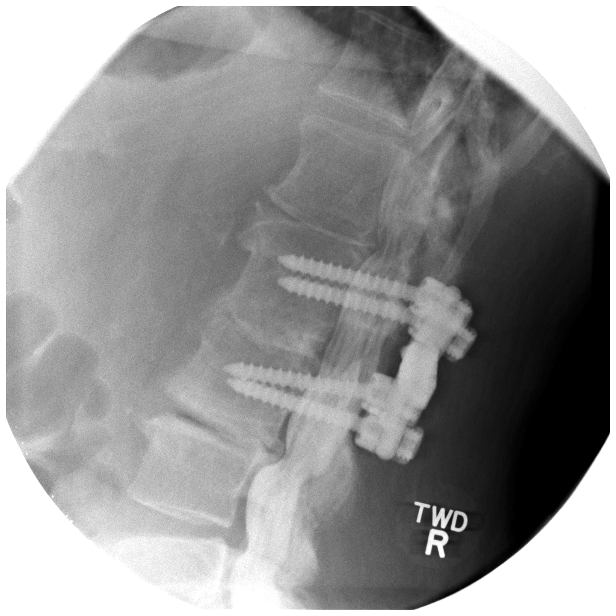

[12 of 15 positions shown; findings below may reference images not displayed]

IMPRESSION: Successful injection of  intrathecal contrast for myelography.

MYELOGRAM LUMBAR
FINDINGS: The patient is status post PLIF at L2-3.  Leftward
curvature of lumbar spine is centered at L3-4 with asymmetric
osteophyte formation on the right.  Despite laminectomy is there is
mild to moderate central canal stenosis at L3-4 with right greater
than left lateral recess narrowing.  There is an early truncation
of the left L5 nerve root at the L4-5 level which could account for
the patient's symptoms.  There is also some medial displacement of
the traversing s one nerve root.  Medial deviation is present on
the right at L4-5 without early truncation.  There is no
significant stenosis at the L2-3 level or L1-2 level.

Slight retrolisthesis with a vacuum disc is present at to L3-4.  A
broad-based disc bulge is present at L4-5.  There is a broad-based
disc herniation at L1-2 as well.  The disc herniation at L4-5 is
worse upon standing.  Alignment is stable.  There is no abnormal
motion with flexion or extension.
IMPRESSION: 1.  Status post PLIF at to L2-3 without significant stenosis.

2.  Broad-based disc bulging at L1-2 without significant stenosis.
3.  Focal levoconvex scoliosis at the adjacent level, L3-4 with
mild to moderate central canal stenosis, right greater than left.
4.  Broad-based disc herniation at L4-5 is exaggerated upon
standing.
5.  Early truncation of the traversing left L5 nerve root at the L4-
5 level is the most likely etiology of the patient's pain.


CT MYELOGRAPHY LUMBAR SPINE
FINDINGS: The lumbar spine is imaged from midbody of T12-S3.
Conus medullaris terminates at L1, within normal limits.  The
patient is status post L2-3 PLIF with solid inner body fusion.
Vacuum disc is present at L3-4 with slight retrolisthesis.
Alignment is otherwise anatomic.  Limited imaging of the abdomen
demonstrate surgical clips the gallbladder fossa.  Atherosclerotic
calcifications are present in the aorta and branch vessels without
aneurysm.

T12-L1:  Negative.

L1-2:  A leftward disc protrusion and asymmetric left-sided facet
hypertrophy results and mild left lateral recess and foraminal
stenosis.

L2-3:  The patient is status post fusion.  No residual or recurrent
stenosis is evident.

L3-4:  There is significant loss of disc height and slight
retrolisthesis.  A broad-based disc herniation is present.
Advanced facet hypertrophy is evident.  Mild lateral recess
narrowing is worse on the right.  Moderate foraminal stenosis is
worse on the right, predominately due to facet disease.

L4-5:  A prominent left paracentral disc protrusion and slight
inferior extrusion extends into the left lateral recess, displacing
the traversing left L5 nerve root.  Mild to moderate foraminal
stenosis bilaterally is worse on the left.  This is secondary to
facet arthropathy.

L5-S1:  Advanced facet arthropathy is evident bilaterally.  A mild
central disc protrusion is noted.  Facet hypertrophy contributes to
mild left foraminal stenosis.  Slight right lateral recess
narrowing is evident.
IMPRESSION: 1.  Moderate to severe left lateral recess narrowing at L4-5
secondary to a focal left paracentral disc protrusion and inferior
extrusion likely affects the traversing left L5 nerve root to,
correlating with the patient's symptoms.
2.  Status post PLIF at L2-3 without significant stenosis at that
level.
3.  Mild left lateral recess and foraminal stenosis at L1-2.
4.  Mild lateral recess and moderate foraminal stenosis bilaterally
at L3-4 is worse on the right.
5.  Focal levoconvex scoliosis at L3-4 with asymmetric right-sided
facet hypertrophy and disc herniation.
6.  Mild to moderate foraminal stenosis bilaterally at L4-5 is
worse on the left.
7.  Mild left foraminal and slight right lateral recess narrowing
at L5-S1.

## 2016-02-24 ENCOUNTER — Inpatient Hospital Stay (HOSPITAL_COMMUNITY): Payer: Managed Care, Other (non HMO)

## 2016-02-24 ENCOUNTER — Inpatient Hospital Stay (HOSPITAL_COMMUNITY)
Admission: AD | Admit: 2016-02-24 | Discharge: 2016-02-29 | DRG: 208 | Disposition: A | Discharge status: Home / Self Care | Payer: Managed Care, Other (non HMO) | Source: Other Acute Inpatient Hospital | Attending: Internal Medicine | Admitting: Internal Medicine

## 2016-02-24 DIAGNOSIS — R262 Difficulty in walking, not elsewhere classified: Secondary | ICD-10-CM

## 2016-02-24 DIAGNOSIS — G4733 Obstructive sleep apnea (adult) (pediatric): Secondary | ICD-10-CM | POA: Diagnosis present

## 2016-02-24 DIAGNOSIS — Z79899 Other long term (current) drug therapy: Secondary | ICD-10-CM | POA: Diagnosis not present

## 2016-02-24 DIAGNOSIS — Z885 Allergy status to narcotic agent status: Secondary | ICD-10-CM | POA: Diagnosis not present

## 2016-02-24 DIAGNOSIS — I509 Heart failure, unspecified: Secondary | ICD-10-CM | POA: Diagnosis present

## 2016-02-24 DIAGNOSIS — E7849 Other hyperlipidemia: Secondary | ICD-10-CM | POA: Diagnosis present

## 2016-02-24 DIAGNOSIS — Z96651 Presence of right artificial knee joint: Secondary | ICD-10-CM | POA: Diagnosis present

## 2016-02-24 DIAGNOSIS — K219 Gastro-esophageal reflux disease without esophagitis: Secondary | ICD-10-CM | POA: Diagnosis present

## 2016-02-24 DIAGNOSIS — Z9049 Acquired absence of other specified parts of digestive tract: Secondary | ICD-10-CM | POA: Diagnosis not present

## 2016-02-24 DIAGNOSIS — T380X5A Adverse effect of glucocorticoids and synthetic analogues, initial encounter: Secondary | ICD-10-CM | POA: Diagnosis present

## 2016-02-24 DIAGNOSIS — M1990 Unspecified osteoarthritis, unspecified site: Secondary | ICD-10-CM | POA: Diagnosis present

## 2016-02-24 DIAGNOSIS — J189 Pneumonia, unspecified organism: Secondary | ICD-10-CM | POA: Diagnosis present

## 2016-02-24 DIAGNOSIS — Z888 Allergy status to other drugs, medicaments and biological substances status: Secondary | ICD-10-CM | POA: Diagnosis not present

## 2016-02-24 DIAGNOSIS — Z9889 Other specified postprocedural states: Secondary | ICD-10-CM

## 2016-02-24 DIAGNOSIS — J9601 Acute respiratory failure with hypoxia: Secondary | ICD-10-CM

## 2016-02-24 DIAGNOSIS — E876 Hypokalemia: Secondary | ICD-10-CM | POA: Diagnosis present

## 2016-02-24 DIAGNOSIS — E785 Hyperlipidemia, unspecified: Secondary | ICD-10-CM | POA: Diagnosis present

## 2016-02-24 DIAGNOSIS — J181 Lobar pneumonia, unspecified organism: Secondary | ICD-10-CM | POA: Diagnosis not present

## 2016-02-24 DIAGNOSIS — J441 Chronic obstructive pulmonary disease with (acute) exacerbation: Secondary | ICD-10-CM | POA: Diagnosis present

## 2016-02-24 DIAGNOSIS — J13 Pneumonia due to Streptococcus pneumoniae: Secondary | ICD-10-CM | POA: Diagnosis not present

## 2016-02-24 DIAGNOSIS — I110 Hypertensive heart disease with heart failure: Secondary | ICD-10-CM | POA: Diagnosis present

## 2016-02-24 DIAGNOSIS — Z978 Presence of other specified devices: Secondary | ICD-10-CM | POA: Diagnosis present

## 2016-02-24 DIAGNOSIS — J440 Chronic obstructive pulmonary disease with acute lower respiratory infection: Principal | ICD-10-CM | POA: Diagnosis present

## 2016-02-24 DIAGNOSIS — Z23 Encounter for immunization: Secondary | ICD-10-CM

## 2016-02-24 DIAGNOSIS — Z4659 Encounter for fitting and adjustment of other gastrointestinal appliance and device: Secondary | ICD-10-CM

## 2016-02-24 DIAGNOSIS — Z87891 Personal history of nicotine dependence: Secondary | ICD-10-CM | POA: Diagnosis not present

## 2016-02-24 DIAGNOSIS — R739 Hyperglycemia, unspecified: Secondary | ICD-10-CM | POA: Diagnosis present

## 2016-02-24 DIAGNOSIS — D649 Anemia, unspecified: Secondary | ICD-10-CM | POA: Diagnosis present

## 2016-02-24 DIAGNOSIS — J432 Centrilobular emphysema: Secondary | ICD-10-CM | POA: Diagnosis present

## 2016-02-24 DIAGNOSIS — J989 Respiratory disorder, unspecified: Secondary | ICD-10-CM | POA: Diagnosis not present

## 2016-02-24 LAB — POCT I-STAT 3, ART BLOOD GAS (G3+)
ACID-BASE EXCESS: 2 mmol/L (ref 0.0–2.0)
Bicarbonate: 28 mmol/L (ref 20.0–28.0)
O2 Saturation: 93 %
PH ART: 7.385 (ref 7.350–7.450)
TCO2: 29 mmol/L (ref 0–100)
pCO2 arterial: 46.8 mmHg (ref 32.0–48.0)
pO2, Arterial: 68 mmHg — ABNORMAL LOW (ref 83.0–108.0)

## 2016-02-24 LAB — PHOSPHORUS: PHOSPHORUS: 4.4 mg/dL (ref 2.5–4.6)

## 2016-02-24 LAB — COMPREHENSIVE METABOLIC PANEL
ALT: 35 U/L (ref 14–54)
AST: 39 U/L (ref 15–41)
Albumin: 2.9 g/dL — ABNORMAL LOW (ref 3.5–5.0)
Alkaline Phosphatase: 66 U/L (ref 38–126)
Anion gap: 14 (ref 5–15)
BILIRUBIN TOTAL: 0.9 mg/dL (ref 0.3–1.2)
BUN: 27 mg/dL — ABNORMAL HIGH (ref 6–20)
CHLORIDE: 99 mmol/L — AB (ref 101–111)
CO2: 23 mmol/L (ref 22–32)
CREATININE: 1.07 mg/dL — AB (ref 0.44–1.00)
Calcium: 8.5 mg/dL — ABNORMAL LOW (ref 8.9–10.3)
GFR, EST NON AFRICAN AMERICAN: 52 mL/min — AB (ref >60)
Glucose, Bld: 187 mg/dL — ABNORMAL HIGH (ref 65–99)
POTASSIUM: 4 mmol/L (ref 3.5–5.1)
Sodium: 136 mmol/L (ref 135–145)
TOTAL PROTEIN: 7.1 g/dL (ref 6.5–8.1)

## 2016-02-24 LAB — STREP PNEUMONIAE URINARY ANTIGEN: Strep Pneumo Urinary Antigen: NEGATIVE

## 2016-02-24 LAB — BRAIN NATRIURETIC PEPTIDE: B NATRIURETIC PEPTIDE 5: 94.5 pg/mL (ref 0.0–100.0)

## 2016-02-24 LAB — GLUCOSE, CAPILLARY: GLUCOSE-CAPILLARY: 127 mg/dL — AB (ref 65–99)

## 2016-02-24 LAB — CBC
HEMATOCRIT: 29.9 % — AB (ref 36.0–46.0)
Hemoglobin: 9.7 g/dL — ABNORMAL LOW (ref 12.0–15.0)
MCH: 29.3 pg (ref 26.0–34.0)
MCHC: 32.4 g/dL (ref 30.0–36.0)
MCV: 90.3 fL (ref 78.0–100.0)
PLATELETS: ADEQUATE 10*3/uL (ref 150–400)
RBC: 3.31 MIL/uL — ABNORMAL LOW (ref 3.87–5.11)
RDW: 14.7 % (ref 11.5–15.5)
WBC: 13.7 10*3/uL — ABNORMAL HIGH (ref 4.0–10.5)

## 2016-02-24 LAB — MRSA PCR SCREENING: MRSA by PCR: NEGATIVE

## 2016-02-24 LAB — MAGNESIUM: MAGNESIUM: 1.8 mg/dL (ref 1.7–2.4)

## 2016-02-24 LAB — TROPONIN I: Troponin I: <0.03 ng/mL (ref <0.03)

## 2016-02-24 MED ORDER — IPRATROPIUM-ALBUTEROL 0.5-2.5 (3) MG/3ML IN SOLN
3.0000 mL | Freq: Four times a day (QID) | RESPIRATORY_TRACT | Status: DC
Start: 2016-02-24 — End: 2016-02-26
  Administered 2016-02-24 – 2016-02-25 (×6): 3 mL via RESPIRATORY_TRACT
  Filled 2016-02-24 (×6): qty 3

## 2016-02-24 MED ORDER — LEVOFLOXACIN IN D5W 500 MG/100ML IV SOLN
500.0000 mg | INTRAVENOUS | Status: AC
  Administered 2016-02-25 – 2016-02-27 (×3): 500 mg via INTRAVENOUS
  Filled 2016-02-24 (×3): qty 100

## 2016-02-24 MED ORDER — VITAL HIGH PROTEIN PO LIQD
1000.0000 mL | ORAL | Status: DC
  Administered 2016-02-24: 1000 mL
  Filled 2016-02-24: qty 1000

## 2016-02-24 MED ORDER — LEVOFLOXACIN IN D5W 750 MG/150ML IV SOLN
750.0000 mg | Freq: Once | INTRAVENOUS | Status: AC
  Administered 2016-02-24: 750 mg via INTRAVENOUS
  Filled 2016-02-24 (×2): qty 150

## 2016-02-24 MED ORDER — INSULIN ASPART 100 UNIT/ML ~~LOC~~ SOLN
0.0000 [IU] | SUBCUTANEOUS | Status: DC
  Administered 2016-02-24: 2 [IU] via SUBCUTANEOUS
  Administered 2016-02-24 – 2016-02-25 (×2): 1 [IU] via SUBCUTANEOUS
  Administered 2016-02-25: 2 [IU] via SUBCUTANEOUS
  Administered 2016-02-25: 1 [IU] via SUBCUTANEOUS
  Administered 2016-02-25: 2 [IU] via SUBCUTANEOUS
  Administered 2016-02-25: 1 [IU] via SUBCUTANEOUS
  Administered 2016-02-25 – 2016-02-26 (×2): 2 [IU] via SUBCUTANEOUS
  Administered 2016-02-26 (×3): 1 [IU] via SUBCUTANEOUS
  Administered 2016-02-26: 2 [IU] via SUBCUTANEOUS
  Administered 2016-02-27: 1 [IU] via SUBCUTANEOUS

## 2016-02-24 MED ORDER — VANCOMYCIN HCL 10 G IV SOLR
2000.0000 mg | Freq: Once | INTRAVENOUS | Status: AC
  Administered 2016-02-24: 2000 mg via INTRAVENOUS
  Filled 2016-02-24: qty 2000

## 2016-02-24 MED ORDER — SODIUM CHLORIDE 0.9 % IV SOLN
3.0000 g | Freq: Once | INTRAVENOUS | Status: AC
  Administered 2016-02-24: 3 g via INTRAVENOUS
  Filled 2016-02-24: qty 3

## 2016-02-24 MED ORDER — MIDAZOLAM HCL 2 MG/2ML IJ SOLN: 1.0000 mg | INTRAMUSCULAR | Status: DC | PRN

## 2016-02-24 MED ORDER — PREDNISONE 20 MG PO TABS
20.0000 mg | ORAL_TABLET | Freq: Every day | ORAL | Status: DC
  Administered 2016-02-25: 20 mg via ORAL
  Filled 2016-02-24: qty 1

## 2016-02-24 MED ORDER — SIMVASTATIN 40 MG PO TABS
40.0000 mg | ORAL_TABLET | Freq: Every day | ORAL | Status: DC
  Administered 2016-02-24 – 2016-02-28 (×5): 40 mg via ORAL
  Filled 2016-02-24 (×5): qty 1

## 2016-02-24 MED ORDER — FENTANYL 2500MCG IN NS 250ML (10MCG/ML) PREMIX INFUSION
25.0000 ug/h | INTRAVENOUS | Status: DC
  Administered 2016-02-24 – 2016-02-26 (×2): 50 ug/h via INTRAVENOUS
  Filled 2016-02-24 (×2): qty 250

## 2016-02-24 MED ORDER — SODIUM CHLORIDE 0.9 % IV SOLN
3.0000 g | Freq: Four times a day (QID) | INTRAVENOUS | Status: DC
  Administered 2016-02-24 – 2016-02-25 (×2): 3 g via INTRAVENOUS
  Filled 2016-02-24 (×5): qty 3

## 2016-02-24 MED ORDER — ALBUTEROL SULFATE (2.5 MG/3ML) 0.083% IN NEBU: 2.5000 mg | INHALATION_SOLUTION | RESPIRATORY_TRACT | Status: DC | PRN

## 2016-02-24 MED ORDER — ONDANSETRON HCL 4 MG/2ML IJ SOLN: 4.0000 mg | Freq: Four times a day (QID) | INTRAMUSCULAR | Status: DC | PRN

## 2016-02-24 MED ORDER — DOCUSATE SODIUM 50 MG/5ML PO LIQD: 100.0000 mg | Freq: Two times a day (BID) | ORAL | Status: DC | PRN

## 2016-02-24 MED ORDER — HEPARIN SODIUM (PORCINE) 5000 UNIT/ML IJ SOLN
5000.0000 [IU] | Freq: Three times a day (TID) | INTRAMUSCULAR | Status: DC
  Administered 2016-02-24 – 2016-02-27 (×8): 5000 [IU] via SUBCUTANEOUS
  Filled 2016-02-24 (×9): qty 1

## 2016-02-24 MED ORDER — ORAL CARE MOUTH RINSE
15.0000 mL | Freq: Four times a day (QID) | OROMUCOSAL | Status: DC
Start: 2016-02-25 — End: 2016-02-25
  Administered 2016-02-25 (×4): 15 mL via OROMUCOSAL

## 2016-02-24 MED ORDER — ACETAMINOPHEN 325 MG PO TABS: 650.0000 mg | ORAL_TABLET | ORAL | Status: DC | PRN

## 2016-02-24 MED ORDER — FENTANYL CITRATE (PF) 100 MCG/2ML IJ SOLN
50.0000 ug | Freq: Once | INTRAMUSCULAR | Status: AC
  Administered 2016-02-24: 50 ug via INTRAVENOUS

## 2016-02-24 MED ORDER — SODIUM CHLORIDE 0.9 % IV SOLN: 250.0000 mL | INTRAVENOUS | Status: DC | PRN

## 2016-02-24 MED ORDER — FENTANYL BOLUS VIA INFUSION
25.0000 ug | INTRAVENOUS | Status: DC | PRN
  Filled 2016-02-24: qty 25

## 2016-02-24 MED ORDER — CHLORHEXIDINE GLUCONATE 0.12% ORAL RINSE (MEDLINE KIT)
15.0000 mL | Freq: Two times a day (BID) | OROMUCOSAL | Status: DC
  Administered 2016-02-24 – 2016-02-26 (×4): 15 mL via OROMUCOSAL

## 2016-02-24 MED ORDER — ASPIRIN EC 81 MG PO TBEC
81.0000 mg | DELAYED_RELEASE_TABLET | Freq: Every day | ORAL | Status: DC
  Administered 2016-02-24 – 2016-02-25 (×2): 81 mg via ORAL
  Filled 2016-02-24 (×3): qty 1

## 2016-02-24 MED ORDER — PRO-STAT SUGAR FREE PO LIQD
30.0000 mL | Freq: Two times a day (BID) | ORAL | Status: DC
  Administered 2016-02-24 – 2016-02-25 (×3): 30 mL
  Filled 2016-02-24 (×3): qty 30

## 2016-02-24 MED ORDER — FAMOTIDINE 20 MG PO TABS
20.0000 mg | ORAL_TABLET | Freq: Two times a day (BID) | ORAL | Status: DC
  Administered 2016-02-24 – 2016-02-26 (×6): 20 mg via ORAL
  Filled 2016-02-24 (×8): qty 1

## 2016-02-24 MED ORDER — VANCOMYCIN HCL IN DEXTROSE 750-5 MG/150ML-% IV SOLN
750.0000 mg | Freq: Two times a day (BID) | INTRAVENOUS | Status: DC
  Administered 2016-02-25 – 2016-02-26 (×3): 750 mg via INTRAVENOUS
  Filled 2016-02-24 (×4): qty 150

## 2016-02-24 NOTE — Progress Notes (Signed)
Pharmacy Antibiotic Note  Lynn Dixon is a 65 y.o. female admitted on 02/24/2016 with pneumonia.  Patient transferred from [facility] to [facility]  Plan: Levoflox 750 mg x 1, then 500 mg q48h Unasyn 3 g q6h Vancomycin 2000 mg x 1 then 750 mg q 12h Monitor renal fx vt cx  Height: 4' 9.5" (146.1 cm) Weight: 205 lb 0.4 oz (93 kg) IBW/kg (Calculated) : 39.75  No data recorded.   Recent Labs Lab 02/24/16 1559  WBC 13.7*  CREATININE 1.07*    Estimated Creatinine Clearance: 49.2 mL/min (by C-G formula based on SCr of 1.07 mg/dL (H)).    Allergies  Allergen Reactions  . Coconut Oil Anaphylaxis  . Morphine And Related Other (See Comments)    unknown  . Estrogens Rash    Antimicrobials this admission: Levofloxacin 11/9>>11/11 (OSH); 11/12>> Azithromycin 11/11>>11/12 (OSH) Zosyn 11/11>>11/12 (OSH) Unasyn 11/12>>  Isaac BlissMichael Nichollas Perusse, PharmD, BCPS, [facility] Clinical Pharmacist Pager [phone] 02/24/2016 5:15 PM

## 2016-02-24 NOTE — Progress Notes (Signed)
Sputum sample obtained and sent down to main lab without complications.  

## 2016-02-24 NOTE — H&P (Signed)
PULMONARY / CRITICAL CARE MEDICINE   Name: Lynn Dixon MRN: [id number] DOB: 1950/09/22    ADMISSION DATE:  02/24/2016 CONSULTATION DATE:  02/24/2016  REFERRING MD:  Dr. Doyne Keel  CHIEF COMPLAINT:  Shortness of breath  HISTORY OF PRESENT ILLNESS:   This is a 66 year old female with a past medical history significant for COPD who is admitted to [facility] on 02/21/2016 with acute respiratory failure with hypoxemia. The patient was intubated upon arrival to our facility so we could not obtain history directly from her history was all obtained by chart review. Review of records reveals that she presented on 02/21/2016 to the outside hospital where she was noted to have a right upper lobe infiltrate with a fever to 101.4. She was treated for community-acquired pneumonia with antibiotics and BiPAP due to high O2 requirements and increased work of breathing. However, her oxygen needs continue to increase and on 02/24/2016 pulmonary and critical care medicine at [facility] was asked to accept her on transfer due to worsening oxygenation and worsening chest x-ray findings. She was intubated at that point and then transferred to our facility.  Review of the records from [facility] state that she was treated with Levaquin from November 9 through November 11. On November 11 she was changed to Zosyn and azithromycin. She was treated with BiPAP for what appears to be the better part of November 10 and November 11. On November 11 her FiO2 is noted to be 75%. By the morning of 02/24/2016 her chest x-ray was worsening and her oxygen needs were climbing. She was intubated by my recommendation and then transferred to our facility. She was received here on high doses of IV Versed and fentanyl. Of note, she was treated with prednisone as well as furosemide during her hospital stay at [facility].  PAST MEDICAL HISTORY :  She  has a past medical history of Arthritis; COPD (chronic  obstructive pulmonary disease); Depression; GERD (gastroesophageal reflux disease); H/O hiatal hernia; Hyperlipidemia; Hypertension; Shortness of breath; and Sleep apnea.  PAST SURGICAL HISTORY: She  has a past surgical history that includes Back surgery; Cholecystectomy; Appendectomy; CARPEL TUNNEL; Breast surgery; Total knee arthroplasty (Right); and Lumbar laminectomy/decompression microdiscectomy (Left, 12/03/2012).  Allergies  Allergen Reactions  . Coconut Oil Anaphylaxis  . Morphine And Related Other (See Comments)    unknown  . Estrogens Rash    No current facility-administered medications on file prior to encounter.    Current Outpatient Prescriptions on File Prior to Encounter  Medication Sig  . aspirin 81 MG chewable tablet Chew 81 mg by mouth daily.  Marland Kitchen CALCIUM PO Take 1 tablet by mouth daily.  . ciprofloxacin (CIPRO) 500 MG tablet Take 1 tablet (500 mg total) by mouth 2 (two) times daily.  Marland Kitchen docusate sodium (COLACE) 100 MG capsule Take 1 capsule (100 mg total) by mouth 2 (two) times daily.  . fish oil-omega-3 fatty acids 1000 MG capsule Take 2 g by mouth 2 (two) times daily.  Marland Kitchen GLUCOSAMINE-CHONDROITIN PO Take 1 tablet by mouth daily.  Marland Kitchen HYDROcodone-acetaminophen (NORCO/VICODIN) 5-325 MG per tablet Take 2 tablets by mouth every 6 (six) hours as needed for pain.  Marland Kitchen lisinopril (PRINIVIL,ZESTRIL) 40 MG tablet Take 40 mg by mouth daily.  Marland Kitchen loratadine (CLARITIN) 10 MG tablet Take 10 mg by mouth daily.  . montelukast (SINGULAIR) 10 MG tablet Take 10 mg by mouth at bedtime.  . Multiple Vitamin (MULTIVITAMIN WITH MINERALS) TABS tablet Take 1 tablet by mouth daily.  Marland Kitchen  omeprazole (PRILOSEC) 20 MG capsule Take 40 mg by mouth daily.  Marland Kitchen. senna (SENOKOT) 8.6 MG TABS tablet Take 1 tablet by mouth daily.  . sertraline (ZOLOFT) 100 MG tablet Take 100 mg by mouth daily.  . simvastatin (ZOCOR) 80 MG tablet Take 40 mg by mouth at bedtime.  . temazepam (RESTORIL) 15 MG capsule Take 15 mg by mouth  at bedtime as needed for sleep.    FAMILY HISTORY:  Her has no family status information on file.    SOCIAL HISTORY: She  reports that she quit smoking about 7 years ago. She has never used smokeless tobacco. She reports that she does not drink alcohol or use drugs.  REVIEW OF SYSTEMS:   Cannot obtain due to intubation  SUBJECTIVE:  As above  VITAL SIGNS: BP (!) 95/52 (BP Location: Right Arm)   Pulse (!) 54   Resp 15   SpO2 91%   HEMODYNAMICS:    VENTILATOR SETTINGS:   Vent Mode: PCV FiO2 (%):  [70 %] 70 % Set Rate:  [15 bmp] 15 bmp PEEP:  [10 cmH20] 10 cmH20 Plateau Pressure:  [22 cmH20] 22 cmH20   INTAKE / OUTPUT: No intake/output data recorded.  PHYSICAL EXAMINATION: General:  Chronically ill-appearing female, resting comfortably on ventilator Neuro:  Sleepy but will arouse to voice, follows commands, interactive, moves all 4 extremities HEENT:  Normocephalic atraumatic, endotracheal tube in place Cardiovascular:  Regular rate and rhythm, slightly bradycardic, no murmurs gallops or rubs Lungs:  Surprisingly clear to auscultation, no clear wheezing, scattered crackles Abdomen:  Bowel sounds positive, nontender nondistended Musculoskeletal:  Normal bulk and tone Skin:  No edema noted, no rash or skin breakdown  LABS:  BMET No results for input(s): NA, K, CL, CO2, BUN, CREATININE, GLUCOSE in the last 168 hours.  Electrolytes No results for input(s): CALCIUM, MG, PHOS in the last 168 hours.  CBC No results for input(s): WBC, HGB, HCT, PLT in the last 168 hours.  Coag's No results for input(s): APTT, INR in the last 168 hours.  Sepsis Markers No results for input(s): LATICACIDVEN, PROCALCITON, O2SATVEN in the last 168 hours.  ABG No results for input(s): PHART, PCO2ART, PO2ART in the last 168 hours.  Liver Enzymes No results for input(s): AST, ALT, ALKPHOS, BILITOT, ALBUMIN in the last 168 hours.  Cardiac Enzymes No results for input(s): TROPONINI,  PROBNP in the last 168 hours.  Glucose No results for input(s): GLUCAP in the last 168 hours.  Imaging No results found.   02/24/2016 ABG preintubation pH 7.44/PCO2 44/PaO2 57/total CO2 31.3 02/24/2016 CBC White blood cell count 12.3, hemoglobin 9.3, hematocrit 28.9, platelet 241 02/24/2016 basic metabolic panel: Sodium 142, potassium 3.9, chloride 100, carbon dioxide 26, BUN 28, creatinine 0.94, glucose 110   STUDIES:  02/22/2016 CT angiogram chest>  no evidence of pulmonary embolism, dense right upper lobe consolidation with scattered groundglass throughout both lungs, there is mild centrilobular emphysema and upper lobe distribution  CULTURES: 11/12 blood >  11/12 resp >    ANTIBIOTICS: 11/9 Levaquin > 11/11 11/11 Zosyn > 11/12 11/12 Azithro > 11/12  11/12 Levaquin >  11/12 Unasyn >  11/12 Vanc >   SIGNIFICANT EVENTS:   LINES/TUBES: 11/12 ETT >   DISCUSSION: 65 year old female with a past medical history significant for COPD here with acute respiratory failure with hypoxemia secondary to severe community-acquired pneumonia. She has multilobar pneumonia with primarily significant hypoxemia. This does seem to fit a community-acquired pneumonia syndrome but considering her failure to progress,  and the multilobar nature of her COPD will cover for staph. I do not think she has a COPD exacerbation. I suspect that baseline emphysema with significant consolidation is contributing to her profound hypoxemia.  ASSESSMENT / PLAN:  PULMONARY A: Centrilobular emphysema, unknown baseline pulmonary function Acute respiratory failure with hypoxemia Severe community-acquired pneumonia Obstructive sleep apnea P:   Continue full ventilator support, will use pressure control for comfort, continue PEEP of 10, goal FiO2 60% or below ABG now Chest x-ray now Ventilator associated pneumonia prevention protocol Hold home long acting bronchodilators for now Continue DuoNeb every 6  hours Target SaO2 88-92%  Wean prednisone to 20mg   CARDIOVASCULAR A:  No acute issues Hypertension > now normotensive P:  Hold home lisinopril and furosemide Continue home Zocor and ASA Tele Monitor BP  RENAL A:   No acute issues P:   Monitor BMET and UOP Replace electrolytes as needed   GASTROINTESTINAL A:   No acute issues P:   Start tube feeding for nutrition per unit protocol Famotidine twice a day for stress ulcer prophylaxis  HEMATOLOGIC A:   Anemia without bleeding P:  Subcutaneous heparin for DVT prophylaxis Monitor for bleeding  INFECTIOUS A:   Severe community acquired pneumonia, multifocal nature worrisome for MRSA P:   Respiratory culture Add vancomycin Unasyn Levaquin  ENDOCRINE A:   Hyperglycemia in setting of prednisone use   P:   Wean prednisone Sliding scale insulin, sensitive scale  NEUROLOGIC A:   Sedation needs for ventilator synchrony P:   RASS goal: -1 Fentanyl gtt and prn versed   FAMILY  - Updates: Husband and son updated bedside by  02/24/2016  - Inter-disciplinary family meet or Palliative Care meeting due by:  day 7  My cc time 45 minutes  Heber CarolinaBrent McQuaid, MD [facility] [facility] Pager: [phone] Cell: [phone] After 3pm or if no response, call [phone]   02/24/2016, 2:55 PM

## 2016-02-24 NOTE — Progress Notes (Signed)
[facility]. Calm, cooperative. VS stable. NP at the bedside.

## 2016-02-25 ENCOUNTER — Inpatient Hospital Stay (HOSPITAL_COMMUNITY): Payer: Managed Care, Other (non HMO)

## 2016-02-25 DIAGNOSIS — Z4659 Encounter for fitting and adjustment of other gastrointestinal appliance and device: Secondary | ICD-10-CM

## 2016-02-25 LAB — BLOOD GAS, ARTERIAL
Acid-Base Excess: 3.4 mmol/L — ABNORMAL HIGH (ref 0.0–2.0)
Bicarbonate: 28.1 mmol/L — ABNORMAL HIGH (ref 20.0–28.0)
DRAWN BY: 46203
FIO2: 70
LHR: 15 {breaths}/min
O2 SAT: 91.1 %
PATIENT TEMPERATURE: 98.6
PCO2 ART: 47.9 mmHg (ref 32.0–48.0)
PEEP: 10 cmH2O
PH ART: 7.385 (ref 7.350–7.450)
PRESSURE CONTROL: 15 cmH2O
pO2, Arterial: 66.4 mmHg — ABNORMAL LOW (ref 83.0–108.0)

## 2016-02-25 LAB — BASIC METABOLIC PANEL
Anion gap: 11 (ref 5–15)
BUN: 29 mg/dL — AB (ref 6–20)
CHLORIDE: 107 mmol/L (ref 101–111)
CO2: 24 mmol/L (ref 22–32)
Calcium: 8.7 mg/dL — ABNORMAL LOW (ref 8.9–10.3)
Creatinine, Ser: 0.86 mg/dL (ref 0.44–1.00)
GFR calc Af Amer: >60 mL/min (ref >60)
GFR calc non Af Amer: >60 mL/min (ref >60)
GLUCOSE: 120 mg/dL — AB (ref 65–99)
POTASSIUM: 4 mmol/L (ref 3.5–5.1)
Sodium: 142 mmol/L (ref 135–145)

## 2016-02-25 LAB — PHOSPHORUS: PHOSPHORUS: 3.3 mg/dL (ref 2.5–4.6)

## 2016-02-25 LAB — CBC
HEMATOCRIT: 30 % — AB (ref 36.0–46.0)
HEMOGLOBIN: 9.5 g/dL — AB (ref 12.0–15.0)
MCH: 28.9 pg (ref 26.0–34.0)
MCHC: 31.7 g/dL (ref 30.0–36.0)
MCV: 91.2 fL (ref 78.0–100.0)
Platelets: 214 10*3/uL (ref 150–400)
RBC: 3.29 MIL/uL — AB (ref 3.87–5.11)
RDW: 14.3 % (ref 11.5–15.5)
WBC: 12.4 10*3/uL — ABNORMAL HIGH (ref 4.0–10.5)

## 2016-02-25 LAB — GLUCOSE, CAPILLARY
GLUCOSE-CAPILLARY: 111 mg/dL — AB (ref 65–99)
GLUCOSE-CAPILLARY: 128 mg/dL — AB (ref 65–99)
GLUCOSE-CAPILLARY: 128 mg/dL — AB (ref 65–99)
GLUCOSE-CAPILLARY: 158 mg/dL — AB (ref 65–99)
Glucose-Capillary: 132 mg/dL — ABNORMAL HIGH (ref 65–99)
Glucose-Capillary: 158 mg/dL — ABNORMAL HIGH (ref 65–99)
Glucose-Capillary: 172 mg/dL — ABNORMAL HIGH (ref 65–99)
Glucose-Capillary: 175 mg/dL — ABNORMAL HIGH (ref 65–99)

## 2016-02-25 LAB — MAGNESIUM: MAGNESIUM: 1.8 mg/dL (ref 1.7–2.4)

## 2016-02-25 LAB — TROPONIN I: Troponin I: <0.03 ng/mL (ref <0.03)

## 2016-02-25 MED ORDER — PREDNISONE 10 MG PO TABS
10.0000 mg | ORAL_TABLET | Freq: Every day | ORAL | Status: DC
  Administered 2016-02-26 – 2016-02-27 (×2): 10 mg via ORAL
  Filled 2016-02-25 (×3): qty 1

## 2016-02-25 MED ORDER — VITAL HIGH PROTEIN PO LIQD
1000.0000 mL | ORAL | Status: DC
  Administered 2016-02-26: 1000 mL
  Filled 2016-02-25: qty 1000

## 2016-02-25 MED ORDER — POLYETHYLENE GLYCOL 3350 17 G PO PACK
17.0000 g | PACK | Freq: Every day | ORAL | Status: DC
  Administered 2016-02-25: 17 g via ORAL
  Filled 2016-02-25 (×5): qty 1

## 2016-02-25 MED ORDER — PIPERACILLIN-TAZOBACTAM 3.375 G IVPB
3.3750 g | Freq: Three times a day (TID) | INTRAVENOUS | Status: DC
  Administered 2016-02-25 – 2016-02-26 (×4): 3.375 g via INTRAVENOUS
  Filled 2016-02-25 (×5): qty 50

## 2016-02-25 MED ORDER — ORAL CARE MOUTH RINSE
15.0000 mL | OROMUCOSAL | Status: DC
  Administered 2016-02-25 – 2016-02-26 (×7): 15 mL via OROMUCOSAL

## 2016-02-25 MED ORDER — FUROSEMIDE 10 MG/ML IJ SOLN
20.0000 mg | Freq: Two times a day (BID) | INTRAMUSCULAR | Status: DC
  Administered 2016-02-25 – 2016-02-26 (×4): 20 mg via INTRAVENOUS
  Filled 2016-02-25 (×6): qty 2

## 2016-02-25 MED ORDER — MAGNESIUM SULFATE 2 GM/50ML IV SOLN
2.0000 g | Freq: Once | INTRAVENOUS | Status: AC
  Administered 2016-02-25: 2 g via INTRAVENOUS
  Filled 2016-02-25: qty 50

## 2016-02-25 NOTE — Progress Notes (Signed)
PULMONARY / CRITICAL CARE MEDICINE   Name: Lynn Dixon MRN: [id number] DOB: 1950/10/02    ADMISSION DATE:  02/24/2016 CONSULTATION DATE:  02/24/2016  REFERRING MD:  Dr. Doyne Keel  CHIEF COMPLAINT:  Shortness of breath  Brief:   This is a 65 year old female with a past medical history significant for COPD who is admitted to [facility] on 02/21/2016 with acute respiratory failure with hypoxemia. The patient was intubated upon arrival to our facility so we could not obtain history directly from her history was all obtained by chart review. Review of records reveals that she presented on 02/21/2016 to the outside hospital where she was noted to have a right upper lobe infiltrate with a fever to 101.4. She was treated for community-acquired pneumonia with antibiotics and BiPAP due to high O2 requirements and increased work of breathing. However, her oxygen needs continue to increase and on 02/24/2016 pulmonary and critical care medicine at [facility] was asked to accept her on transfer due to worsening oxygenation and worsening chest x-ray findings. She was intubated at that point and then transferred to our facility.  Review of the records from [facility] state that she was treated with Levaquin from November 9 through November 11. On November 11 she was changed to Zosyn and azithromycin. She was treated with BiPAP for what appears to be the better part of November 10 and November 11. On November 11 her FiO2 is noted to be 75%. By the morning of 02/24/2016 her chest x-ray was worsening and her oxygen needs were climbing. She was intubated by my recommendation and then transferred to our facility. She was received here on high doses of IV Versed and fentanyl. Of note, she was treated with prednisone as well as furosemide during her hospital stay at [facility].  Imaging Dg Chest [place] 1 View 02/24/2016: The ETT is in good position. The NG tube terminates below today's film.  Persistent bilateral pulmonary opacities, mildly improved in the interval. Electronically Signed   By: Gerome Sam III M.D   On: 02/24/2016 15:23    02/24/2016 ABG preintubation pH 7.44/PCO2 44/PaO2 57/total CO2 31.3 02/24/2016 CBC White blood cell count 12.3, hemoglobin 9.3, hematocrit 28.9, platelet 241 02/24/2016 basic metabolic panel: Sodium 142, potassium 3.9, chloride 100, carbon dioxide 26, BUN 28, creatinine 0.94, glucose 110   STUDIES:  02/22/2016 CT angiogram chest>  no evidence of pulmonary embolism, dense right upper lobe consolidation with scattered groundglass throughout both lungs, there is mild centrilobular emphysema and upper lobe distribution  CULTURES: 11/12 blood >  11/12 resp >    ANTIBIOTICS: 11/9 Levaquin > 11/11 11/11 Zosyn > 11/12 11/12 Azithro > 11/12  11/12 Levaquin >  11/12 Unasyn >  11/12 Vanc >    LINES/TUBES: 11/12 ETT >> 11/12 three PIV's> 11/12 foley >> 11/12 OGT >>  Significant events:  11/9: Admitted to [facility] hosp for AHRF due to PNA (RUL infiltrate + fever) 11/12: Intubated and transferred to [facility] due to IWOB and high O2 requirment  SUBJECTIVE:  No complaints today. Denies chest pain, shortness of breath or abdominal pain.    VITAL SIGNS: BP 112/60   Pulse (!) 59   Temp 98 F (36.7 C) (Oral)   Resp 16   Ht 4' 9.5" (1.461 m)   Wt 93.4 kg (205 lb 14.6 oz)   SpO2 97%   BMI 43.79 kg/m   HEMODYNAMICS:    VENTILATOR SETTINGS: Vent Mode: PCV FiO2 (%):  [70 %] 70 % Set  Rate:  [15 bmp] 15 bmp PEEP:  [10 cmH20] 10 cmH20 Plateau Pressure:  [22 cmH20-25 cmH20] 24 cmH20 Vent Mode: PCV FiO2 (%):  [70 %] 70 % Set Rate:  [15 bmp] 15 bmp PEEP:  [10 cmH20] 10 cmH20 Plateau Pressure:  [22 cmH20-25 cmH20] 24 cmH20   INTAKE / OUTPUT: I/O last 3 completed shifts: In: 794.2 [I.V.:4.2; NG/GT:40; IV Piggyback:750] Out: 400 [Urine:400]  PHYSICAL EXAMINATION: General:  resting comfortably on ventilator, no sign of  distress Neuro:  AAO x3, follows commands, interactive, moves all 4 extremities HEENT:  Normocephalic atraumatic, endotracheal tube in place Cardiovascular:  Regular rate and rhythm, slightly bradycardic, no murmurs gallops or rubs, no edema Lungs: clear to auscultation, no clear wheezing, scattered crackles Abdomen:  Bowel sounds positive, nontender nondistended Musculoskeletal:  Normal bulk and tone Skin:  No edema noted, no rash or skin breakdown  LABS:  BMET  Recent Labs Lab 02/24/16 1559 02/25/16 0234  NA 136 142  K 4.0 4.0  CL 99* 107  CO2 23 24  BUN 27* 29*  CREATININE 1.07* 0.86  GLUCOSE 187* 120*    Electrolytes  Recent Labs Lab 02/24/16 1559 02/25/16 0234  CALCIUM 8.5* 8.7*  MG 1.8 1.8  PHOS 4.4 3.3    CBC  Recent Labs Lab 02/24/16 1559 02/25/16 0234  WBC 13.7* 12.4*  HGB 9.7* 9.5*  HCT 29.9* 30.0*  PLT PLATELET CLUMPS NOTED ON SMEAR, COUNT APPEARS ADEQUATE 214    Coag's No results for input(s): APTT, INR in the last 168 hours.  Sepsis Markers No results for input(s): LATICACIDVEN, PROCALCITON, O2SATVEN in the last 168 hours.  ABG  Recent Labs Lab 02/24/16 1542 02/25/16 0400  PHART 7.385 7.385  PCO2ART 46.8 47.9  PO2ART 68.0* 66.4*    Liver Enzymes  Recent Labs Lab 02/24/16 1559  AST 39  ALT 35  ALKPHOS 66  BILITOT 0.9  ALBUMIN 2.9*    Cardiac Enzymes  Recent Labs Lab 02/24/16 1559 02/24/16 2121 02/25/16 0234  TROPONINI <0.03 <0.03 <0.03    Glucose  Recent Labs Lab 02/24/16 2010  GLUCAP 127*     DISCUSSION: 65 year old female with a past medical history significant for COPD here with acute respiratory failure with hypoxemia secondary to severe community-acquired pneumonia for which she is treated with antibiotics and improving.  ASSESSMENT / PLAN:  PULMONARY A: Centrilobular emphysema, unknown baseline pulmonary function Acute respiratory failure with hypoxemia Severe community-acquired  pneumonia Obstructive sleep apnea P:   Ventilator associated pneumonia prevention protocol Continue DuoNeb every 6 hours Albuterol as needed  Wean oxygen. Target SaO2 88-92%  Wean prednisone to 20mg  Consider Zosyn instead of Unasyn and levaquin  CARDIOVASCULAR A:  No acute issues. Trop and BNP negative Hypertension > now normotensive P:  Hold home lisinopril and furosemide Continue home Zocor and ASA Tele Monitor BP  RENAL A:   No acute issues P:   Monitor BMET and UOP Replace electrolytes as needed kvo  GASTROINTESTINAL A:   No acute issues P:   Tube feeding via OGT 40 + 30 cc/hr Famotidine twice a day  Assess BM  HEMATOLOGIC A:   Anemia. Unknown baseline Hgb stable at 9.5 P:  Subcutaneous heparin for DVT prophylaxis Monitor for bleeding  INFECTIOUS A:   Severe community acquired pneumonia, multifocal nature worrisome for MRSA P:   Respiratory culture Maintain vancomycin Consider Zosyn instead of Unasyn and Levaquin  ENDOCRINE A:   Hyperglycemia in setting of prednisone use   P:   Wean prednisone SSI-thin  NEUROLOGIC A:   Sedation needs for ventilator synchrony P:   RASS goal: -1 Fentanyl gtt @ 75ml/hr Change to fentanyl and versed as needed   FAMILY  - Updates: Husband and son updated bedside on 02/24/2016.  - Inter-disciplinary family meet or Palliative Care meeting due by:  day 7  02/25/2016, 6:21 AM  STAFF NOTE: I, Rory Percyaniel Wacey Zieger, MD FACP have personally reviewed patient's available data, including medical history, events of note, physical examination and test results as part of my evaluation. I have discussed with resident/NP and other care providers such as pharmacist, RN and RRT. In addition, I personally evaluated patient and elicited key findings of: awake fully, nonofocal examination, ronchi, CT reviewed and pcxr today worsening edema on top?, abg reviewed, keep same MV, hope peep to 8 today , is this edema on top, add lasix , she  is lasix dep, BP is wnl, consider cpap 10 , ps 15, she is doing well on PC vent keep, kvo, cont TF, put her in chair position, chage unasyn to zosyn fo rmore traditional CAP coverage in setting allergies, keep atypical coverage present, ensure leg testing with ct finsdings, pred 20 reduce 10,. Add mirilax, need BM, keep vanc, glu wnl The patient is critically ill with multiple organ systems failure and requires high complexity decision making for assessment and support, frequent evaluation and titration of therapies, application of advanced monitoring technologies and extensive interpretation of multiple databases.   Critical Care Time devoted to patient care services described in this note is 35 Minutes. This time reflects time of care of this signee: Rory Percyaniel Dusten Ellinwood, MD FACP. This critical care time does not reflect procedure time, or teaching time or supervisory time of PA/NP/Med student/Med Resident etc but could involve care discussion time. Rest per NP/medical resident whose note is outlined above and that I agree with   Mcarthur Rossettianiel J. Tyson AliasFeinstein, MD, FACP Pgr: [phone] [facility] 02/25/2016 8:53 AM

## 2016-02-25 NOTE — Progress Notes (Signed)
Initial Nutrition Assessment  DOCUMENTATION CODES:   Morbid obesity  INTERVENTION:    Increase Vital High Protein to goal rate of 50 ml/h (1200 ml per day) to provide 1200 kcals, 105 gm protein, 1003 ml free water daily.  NUTRITION DIAGNOSIS:   Inadequate oral intake related to inability to eat as evidenced by NPO status.  GOAL:   Provide needs based on ASPEN/SCCM guidelines  MONITOR:   Vent status, TF tolerance, I & O's, Labs, Weight trends  REASON FOR ASSESSMENT:   Consult Enteral/tube feeding initiation and management  ASSESSMENT:   65 year old female with a past medical history significant for COPD, [facility] with acute respiratory failure with hypoxemia secondary to severe community-acquired pneumonia. Required intubation on admission.  Patient is currently intubated on ventilator support MV: 9.9 L/min Temp (24hrs), Avg:98.5 F (36.9 C), Min:98 F (36.7 C), Max:99.1 F (37.3 C)   Received MD Consult for TF initiation and management. Nutrition-Focused physical exam completed. Findings are no fat depletion, no muscle depletion, and mild edema.  Labs reviewed. Medications reviewed and include Lasix, magnesium sulfate, prednisone, miralax.  Diet Order:  Diet NPO time specified  Skin:  Reviewed, no issues  Last BM:  unknown  Height:   Ht Readings from Last 1 Encounters:  02/24/16 4' 9.5" (1.461 m)    Weight:   Wt Readings from Last 1 Encounters:  02/25/16 205 lb 14.6 oz (93.4 kg)    Ideal Body Weight:  43.6 kg  BMI:  Body mass index is 43.79 kg/m.  Estimated Nutritional Needs:   Kcal:  [id number]  Protein:  109 gm  Fluid:  >/= 1.5 L  EDUCATION NEEDS:   No education needs identified at this time  Joaquin CourtsKimberly Harris, RD, LDN, CNSC Pager [phone] After Hours Pager [phone]

## 2016-02-25 NOTE — Progress Notes (Signed)
Pharmacy Antibiotic Note  Lynn Dixon is a 65 y.o. female admitted on 02/24/2016 with pneumonia.  Pharmacy has been consulted for Zosyn dosing (already on vancomycin, unasyn, and levofloxacin). [facility] and [facility]11/11[facility]11[facility]12). Improved renal function SCr 0.86 and CrCl ~61.273mL.   Plan: Start Zosyn 3.375 gm q8h Continue Vancomycin 750 mg q12h Continue levofloxacin 500 mg q48h D/C Unasyn 3g q6h Monitor renal function, culture results, and clinical course  Height: 4' 9.5" (146.1 cm) Weight: 205 lb 14.6 oz (93.4 kg) IBW/kg (Calculated) : 39.75  Temp (24hrs), Avg:98.5 F (36.9 C), Min:98 F (36.7 C), Max:99.1 F (37.3 C)   Recent Labs Lab 02/24/16 1559 02/25/16 0234  WBC 13.7* 12.4*  CREATININE 1.07* 0.86    Estimated Creatinine Clearance: 61.3 mL/min (by C-G formula based on SCr of 0.86 mg/dL).    Allergies  Allergen Reactions  . Coconut Oil Anaphylaxis  . Cephalosporins Hives  . Sulfa Antibiotics Rash  . Morphine Itching  . Morphine And Related Itching  . Cefuroxime Axetil Itching and Rash  . Estrogens Rash    Antimicrobials this admission: Levofloxacin 11/9>>11/11 (OSH); 11/12>> Azithromycin 11/11>>11/12 (OSH) Zosyn 11/11>>11/12 (OSH); 11/13>> Unasyn 11/12>>11/13 Vacomycin 11/12>>  Dose adjustments this admission:  Microbiology results: 11/12 BCx X2: sent 11/12 Resp Cx: NGTD 11/12 MRSA PCR: Neg  Lawernce KeasRachel D Makaylin Carlo 02/25/2016 9:01 AM

## 2016-02-26 ENCOUNTER — Inpatient Hospital Stay (HOSPITAL_COMMUNITY): Payer: Managed Care, Other (non HMO)

## 2016-02-26 DIAGNOSIS — Z978 Presence of other specified devices: Secondary | ICD-10-CM

## 2016-02-26 LAB — BASIC METABOLIC PANEL
Anion gap: 14 (ref 5–15)
BUN: 34 mg/dL — AB (ref 6–20)
CHLORIDE: 100 mmol/L — AB (ref 101–111)
CO2: 29 mmol/L (ref 22–32)
Calcium: 9.2 mg/dL (ref 8.9–10.3)
Creatinine, Ser: 0.96 mg/dL (ref 0.44–1.00)
GFR calc non Af Amer: 60 mL/min — ABNORMAL LOW (ref >60)
Glucose, Bld: 113 mg/dL — ABNORMAL HIGH (ref 65–99)
POTASSIUM: 3.9 mmol/L (ref 3.5–5.1)
SODIUM: 143 mmol/L (ref 135–145)

## 2016-02-26 LAB — CBC
HEMATOCRIT: 32.6 % — AB (ref 36.0–46.0)
HEMOGLOBIN: 10.4 g/dL — AB (ref 12.0–15.0)
MCH: 28.8 pg (ref 26.0–34.0)
MCHC: 31.9 g/dL (ref 30.0–36.0)
MCV: 90.3 fL (ref 78.0–100.0)
Platelets: 248 10*3/uL (ref 150–400)
RBC: 3.61 MIL/uL — AB (ref 3.87–5.11)
RDW: 14 % (ref 11.5–15.5)
WBC: 17.6 10*3/uL — ABNORMAL HIGH (ref 4.0–10.5)

## 2016-02-26 LAB — GLUCOSE, CAPILLARY
GLUCOSE-CAPILLARY: 142 mg/dL — AB (ref 65–99)
Glucose-Capillary: 173 mg/dL — ABNORMAL HIGH (ref 65–99)
Glucose-Capillary: 124 mg/dL — ABNORMAL HIGH (ref 65–99)
Glucose-Capillary: 179 mg/dL — ABNORMAL HIGH (ref 65–99)
Glucose-Capillary: 134 mg/dL — ABNORMAL HIGH (ref 65–99)

## 2016-02-26 LAB — MAGNESIUM: MAGNESIUM: 2 mg/dL (ref 1.7–2.4)

## 2016-02-26 LAB — CULTURE, RESPIRATORY W GRAM STAIN: Culture: NO GROWTH

## 2016-02-26 LAB — LEGIONELLA PNEUMOPHILA SEROGP 1 UR AG: L. PNEUMOPHILA SEROGP 1 UR AG: NEGATIVE

## 2016-02-26 LAB — PHOSPHORUS: Phosphorus: 3.6 mg/dL (ref 2.5–4.6)

## 2016-02-26 MED ORDER — ORAL CARE MOUTH RINSE
15.0000 mL | Freq: Two times a day (BID) | OROMUCOSAL | Status: DC
  Administered 2016-02-26 – 2016-02-29 (×3): 15 mL via OROMUCOSAL

## 2016-02-26 MED ORDER — IPRATROPIUM-ALBUTEROL 0.5-2.5 (3) MG/3ML IN SOLN
3.0000 mL | Freq: Three times a day (TID) | RESPIRATORY_TRACT | Status: DC
  Administered 2016-02-26 – 2016-02-29 (×11): 3 mL via RESPIRATORY_TRACT
  Filled 2016-02-26 (×11): qty 3

## 2016-02-26 MED ORDER — ASPIRIN 81 MG PO CHEW
81.0000 mg | CHEWABLE_TABLET | Freq: Every day | ORAL | Status: DC
  Administered 2016-02-26 – 2016-02-29 (×4): 81 mg via ORAL
  Filled 2016-02-26 (×4): qty 1

## 2016-02-26 MED ORDER — GABAPENTIN 300 MG PO CAPS
300.0000 mg | ORAL_CAPSULE | Freq: Three times a day (TID) | ORAL | Status: DC
  Administered 2016-02-26 – 2016-02-29 (×8): 300 mg via ORAL
  Filled 2016-02-26 (×9): qty 1

## 2016-02-26 MED ORDER — ALPRAZOLAM 0.5 MG PO TABS
0.5000 mg | ORAL_TABLET | Freq: Two times a day (BID) | ORAL | Status: DC | PRN
  Filled 2016-02-26: qty 1

## 2016-02-26 MED ORDER — GABAPENTIN 250 MG/5ML PO SOLN
300.0000 mg | Freq: Three times a day (TID) | ORAL | Status: DC
  Administered 2016-02-26: 300 mg via ORAL
  Filled 2016-02-26 (×3): qty 6

## 2016-02-26 NOTE — Progress Notes (Signed)
Pharmacy Antibiotic Note  Lynn Dixon is a 65 y.o. female admitted on 02/24/2016 with pneumonia.  Pharmacy has been consulted for levaquin dosing. Cultures were no growth to date and narrowing therapy to levaquin for a course of 7 days. [facility] and [facility]). Renal function of SCr 0.96 and CrCl ~54.276mL.   Plan: Continue levofloxacin 500 mg q24h and discontinue on 11/15 Discontinue Zosyn 3.375 g q8h Discontinue Vancomycin 750 mg 12h Monitor renal function, culture results, and clinical course  Height: 4' 9.5" (146.1 cm) Weight: 203 lb 7.8 oz (92.3 kg) IBW/kg (Calculated) : 39.75  Temp (24hrs), Avg:98.6 F (37 C), Min:98 F (36.7 C), Max:99.1 F (37.3 C)   Recent Labs Lab 02/24/16 1559 02/25/16 0234 02/26/16 0540  WBC 13.7* 12.4* 17.6*  CREATININE 1.07* 0.86 0.96    Estimated Creatinine Clearance: 54.6 mL/min (by C-G formula based on SCr of 0.96 mg/dL).    Allergies  Allergen Reactions  . Coconut Oil Anaphylaxis  . Cephalosporins Hives  . Sulfa Antibiotics Rash  . Morphine Itching  . Morphine And Related Itching  . Cefuroxime Axetil Itching and Rash  . Estrogens Rash    Antimicrobials this admission: Levofloxacin 11/9>>11/11 (OSH); 11/12>>11/15 Azithromycin 11/11>>11/12 (OSH) Zosyn 11/11>>11/12 (OSH); 11/13>>11/14 Unasyn 11/12>>11/13 Vacomycin 11/12>>11/14  Dose adjustments this admission:  Microbiology results: 11/12 BCx X2: NGTD 11/12 Resp Cx: No growth 11/12 MRSA PCR: Neg  Lynn Dixon 02/26/2016 1:09 PM

## 2016-02-26 NOTE — Procedures (Signed)
Extubation Procedure Note  Patient Details:   Name: Lynn Dixon DOB: 01-19-1951 MRN: [id number]   Airway Documentation:     Evaluation  O2 sats: stable throughout Complications: No apparent complications Patient did tolerate procedure well. Bilateral Breath Sounds: Clear   Yes   Patient extubated to 4L nasal cannula.  Positive cuff leak noted.  No evidence of stridor.  Patient able to speak post extubation.  sats currently 90%.  Vitals are stable.  Incentive spirometry performed x5 with achieved goal of 750.  No apparent complications  Durwin GlazeBrown, Anissa Abbs N 02/26/2016, 9:56 AM

## 2016-02-26 NOTE — Plan of Care (Signed)
Problem: Activity: Goal: Ability to tolerate increased activity will improve Outcome: Progressing Pt OOB to BSC with 1+ assist.   Problem: Coping: Goal: Verbalizations of decreased anxiety will increase Outcome: Progressing Pt stated she needed her nerve med (Neurontin) and couldn't rest.  Neurontin given as ordered and order for prn xanax obtained if needed  Problem: Respiratory: Goal: Respiratory status will improve Extubated today. req 5l [place] for O2 sat

## 2016-02-26 NOTE — Progress Notes (Signed)
Fentanyl drip 225cc wasted in sink.  Patient no longer on fentanyl.  Waste witnessed by Arnoldo LenisAlex Shyshko, RN.

## 2016-02-26 NOTE — Progress Notes (Signed)
PULMONARY / CRITICAL CARE MEDICINE   Name: Genia Delancy L Baiza MRN: [id number] DOB: 08/10/1950    ADMISSION DATE:  02/24/2016 CONSULTATION DATE:  02/24/2016  REFERRING MD:  Dr. Doyne KeelParsons  CHIEF COMPLAINT:  Shortness of breath  Brief:   This is a 65 year old female with a past medical history significant for COPD who is admitted to [facility] on 02/21/2016 with acute respiratory failure with hypoxemia. The patient was intubated upon arrival to our facility so we could not obtain history directly from her history was all obtained by chart review. Review of records reveals that she presented on 02/21/2016 to the outside hospital where she was noted to have a right upper lobe infiltrate with a fever to 101.4. She was treated for community-acquired pneumonia with antibiotics and BiPAP due to high O2 requirements and increased work of breathing. However, her oxygen needs continue to increase and on 02/24/2016 pulmonary and critical care medicine at [facility] was asked to accept her on transfer due to worsening oxygenation and worsening chest x-ray findings. She was intubated at that point and then transferred to our facility.  Review of the records from [facility] state that she was treated with Levaquin from November 9 through November 11. On November 11 she was changed to Zosyn and azithromycin. She was treated with BiPAP for what appears to be the better part of November 10 and November 11. On November 11 her FiO2 is noted to be 75%. By the morning of 02/24/2016 her chest x-ray was worsening and her oxygen needs were climbing. She was intubated by my recommendation and then transferred to our facility. She was received here on high doses of IV Versed and fentanyl. Of note, she was treated with prednisone as well as furosemide during her hospital stay at [facility].  Imaging Dg Chest [place] View 02/24/2016: The ETT is in good position. The NG tube terminates below today's film.  Persistent bilateral pulmonary opacities, mildly improved in the interval. Electronically Signed   By: Gerome Samavid  Williams III M.D   On: 02/24/2016 15:23    02/24/2016 ABG preintubation pH 7.44/PCO2 44/PaO2 57/total CO2 31.3 02/24/2016 CBC White blood cell count 12.3, hemoglobin 9.3, hematocrit 28.9, platelet 241 02/24/2016 basic metabolic panel: Sodium 142, potassium 3.9, chloride 100, carbon dioxide 26, BUN 28, creatinine 0.94, glucose 110   STUDIES:  02/22/2016 CT angiogram chest>  no evidence of pulmonary embolism, dense right upper lobe consolidation with scattered groundglass throughout both lungs, there is mild centrilobular emphysema and upper lobe distribution  CULTURES: 11/12 blood >  11/12 resp >    ANTIBIOTICS: 11/9 Levaquin > 11/11 11/11 Zosyn > 11/12 11/12 Azithro > 11/12  11/12 Unasyn>>11/13 11/13 Levaquin >> 11/13 Vanc >>    LINES/TUBES: 11/12 ETT >> 11/12 two PIV's> 11/12 foley >> 11/12 OGT >>  Significant events:  11/9: [facility] due to PNA (RUL infiltrate + fever) 11/12: Intubated and [facility] due to IWOB and high O2 requirment 11/13: diuresed and started zosyn   SUBJECTIVE:  No event overnight. Comfortable on pressure support of 13/8 with bed in sitting position this morning. Denies chest pain or other problem  VITAL SIGNS: BP (!) 128/98   Pulse 65   Temp 99.1 F (37.3 C) (Oral)   Resp 18   Ht 4' 9.5" (1.461 m)   Wt 203 lb 7.8 oz (92.3 kg)   SpO2 96%   BMI 43.27 kg/m   HEMODYNAMICS:  Not on pressors  VENTILATOR SETTINGS: Vent Mode: PSV;CPAP FiO2 (%):  [40 %-50 %] 40 % Set Rate:  [15 bmp] 15 bmp PEEP:  [5 cmH20-8 cmH20] 8 cmH20 Pressure Support:  [5 cmH20-10 cmH20] 5 cmH20 Plateau Pressure:  [19 cmH20-25 cmH20] 19 cmH20 Vent Mode: PSV;CPAP FiO2 (%):  [40 %-50 %] 40 % Set Rate:  [15 bmp] 15 bmp PEEP:  [5 cmH20-8 cmH20] 8 cmH20 Pressure Support:  [5 cmH20-10 cmH20] 5 cmH20 Plateau Pressure:  [19 cmH20-25  cmH20] 19 cmH20   INTAKE / OUTPUT: I/O last 3 completed shifts: In: 2651.5 [I.V.:569; NG/GT:1320; IV Piggyback:762.5] Out: 4175 [Urine:4175]  PHYSICAL EXAMINATION: General: Comfortable on pressure support of 13/8 with bed in sitting position this morning. Neuro:  AAO x3, follows commands, interactive, moves all 4 extremities HEENT:  Normocephalic atraumatic, endotracheal tube in place Cardiovascular:  Regular rate and rhythm, slightly bradycardic, no murmurs gallops or rubs, no edema Lungs: clear to auscultation, no clear wheezing, or crackles Abdomen:  Bowel sounds positive, nontender nondistended Musculoskeletal:  Normal bulk and tone Skin:  No edema noted, no rash or skin breakdown  LABS:  BMET  Recent Labs Lab 02/24/16 1559 02/25/16 0234 02/26/16 0540  NA 136 142 143  K 4.0 4.0 3.9  CL 99* 107 100*  CO2 23 24 29   BUN 27* 29* 34*  CREATININE 1.07* 0.86 0.96  GLUCOSE 187* 120* 113*    Electrolytes  Recent Labs Lab 02/24/16 1559 02/25/16 0234 02/26/16 0540  CALCIUM 8.5* 8.7* 9.2  MG 1.8 1.8 2.0  PHOS 4.4 3.3 3.6    CBC  Recent Labs Lab 02/24/16 1559 02/25/16 0234 02/26/16 0540  WBC 13.7* 12.4* 17.6*  HGB 9.7* 9.5* 10.4*  HCT 29.9* 30.0* 32.6*  PLT PLATELET CLUMPS NOTED ON SMEAR, COUNT APPEARS ADEQUATE 214 248    Coag's No results for input(s): APTT, INR in the last 168 hours.  Sepsis Markers No results for input(s): LATICACIDVEN, PROCALCITON, O2SATVEN in the last 168 hours.  ABG  Recent Labs Lab 02/24/16 1542 02/25/16 0400  PHART 7.385 7.385  PCO2ART 46.8 47.9  PO2ART 68.0* 66.4*    Liver Enzymes  Recent Labs Lab 02/24/16 1559  AST 39  ALT 35  ALKPHOS 66  BILITOT 0.9  ALBUMIN 2.9*    Cardiac Enzymes  Recent Labs Lab 02/24/16 1559 02/24/16 2121 02/25/16 0234  TROPONINI <0.03 <0.03 <0.03    Glucose  Recent Labs Lab 02/25/16 1130 02/25/16 1631 02/25/16 1943 02/25/16 2345 02/26/16 0348 02/26/16 0743  GLUCAP  175* 158* 128* 132* 142* 173*     DISCUSSION: 65 year old female with a past medical history significant for COPD here with acute respiratory failure with hypoxemia secondary to severe community-acquired pneumonia for which she is treated with antibiotics and improving.  ASSESSMENT / PLAN:  PULMONARY A: Centrilobular emphysema, unknown baseline pulmonary function Acute respiratory failure with hypoxemia Severe community-acquired pneumonia Obstructive sleep apnea P:   Ventilator associated pneumonia prevention protocol Continue DuoNeb every 6 hours Albuterol as needed  SAT/SBT Wean prednisone to 10mg  Continue antibiotics until 11/16 Continue lasix 20 twice a day. CXR improved  CARDIOVASCULAR A:  No acute issues. Trop and BNP negative Hypertension > now normotensive P:  Hold home lisinopril  Lasix 20 mg twice a day  Continue home Zocor and ASA Tele Monitor BP  RENAL A:   No acute issues Lytes within normal Fluid -3.5L/-1.5/-1.2L Weight 93>>92 kgs since admit P:   Monitor BMET and UOP Replace electrolytes as needed KVO Lasix repeat  GASTROINTESTINAL A:  No acute issues Had BM this morning- so happy P:   Tube feeding via OGT 50 cc/hr Famotidine twice a day  Miralax as needed  HEMATOLOGIC A:   Anemia. Unknown baseline Hgb stable at 9.5 Leukocytosis likely steroid / hemoconcetration P:  Subcutaneous heparin for DVT prophylaxis Monitor for bleeding Daily CBC  INFECTIOUS A:   Severe CAP Respiratory culture: NGTD Afebrile here Cultures NGTD Leukocytosis likely steroid  P:   Dc vancomycin Continue Levaquin until 11/16 Dc zosyn  ENDOCRINE A:   CBG within range P:   Wean prednisone SSI-thin in the setting of steroid  NEUROLOGIC A:   Sedation needs for ventilator synchrony P:   RASS goal: -1 Stop fentanyl gtt - WUA Fentanyl and versed as needed   FAMILY  - Updates: Husband and son updated bedside on 02/24/2016.  - Inter-disciplinary  family meet or Palliative Care meeting due by:  day 7  02/26/2016, 8:14 AM   STAFF NOTE: I, Rory Percyaniel Feinstein, MD FACP have personally reviewed patient's available data, including medical history, events of note, physical examination and test results as part of my evaluation. I have discussed with resident/NP and other care providers such as pharmacist, RN and RRT. In addition, I personally evaluated patient and elicited key findings of: awake, alert, nonfocal, improved lung sounds Coarse, neg 1.4 liters, weaning aggressive 5 ps, peep from 8 to 5, re assess mechanics, pulm edema may be reasonable part of this - get echo, maintain lasix, x 1, repeat chem in am , remains culture neg, narrow to levofloxacin, add gabapentin, will provide 7 days ABX, add stop date, WUA, weaning aggressive The patient is critically ill with multiple organ systems failure and requires high complexity decision making for assessment and support, frequent evaluation and titration of therapies, application of advanced monitoring technologies and extensive interpretation of multiple databases.   Critical Care Time devoted to patient care services described in this note is 30 Minutes. This time reflects time of care of this signee: Rory Percyaniel Feinstein, MD FACP. This critical care time does not reflect procedure time, or teaching time or supervisory time of PA/NP/Med student/Med Resident etc but could involve care discussion time. Rest per NP/medical resident whose note is outlined above and that I agree with   Mcarthur Rossettianiel J. Tyson AliasFeinstein, MD, FACP Pgr: [phone] [facility] 02/26/2016 9:34 AM

## 2016-02-26 NOTE — Care Management Note (Signed)
Case Management Note  Patient Details  Name: Genia Delancy L Cuppett MRN: [id number] Date of Birth: 07/01/1950  Subjective/Objective:    Pt admitted with HCAP                 Action/Plan:  PTA independent from home with husband.  Pt is alert and follows commands on the ventilator.  CM will continue to follow for discharge needs   Expected Discharge Date:                  Expected Discharge Plan:  Home/Self Care  In-House Referral:     Discharge planning Services  CM Consult  Post Acute Care Choice:    Choice offered to:     DME Arranged:    DME Agency:     HH Arranged:    HH Agency:     Status of Service:  In process, will continue to follow  If discussed at Long Length of Stay Meetings, dates discussed:    Additional Comments:  Cherylann ParrClaxton, Mickie Kozikowski S, RN 02/26/2016, 9:35 AM

## 2016-02-27 ENCOUNTER — Encounter (HOSPITAL_COMMUNITY): Payer: Self-pay | Admitting: *Deleted

## 2016-02-27 ENCOUNTER — Inpatient Hospital Stay (HOSPITAL_COMMUNITY): Payer: Managed Care, Other (non HMO)

## 2016-02-27 DIAGNOSIS — J989 Respiratory disorder, unspecified: Secondary | ICD-10-CM

## 2016-02-27 LAB — CBC
HCT: 33.4 % — ABNORMAL LOW (ref 36.0–46.0)
Hemoglobin: 11.2 g/dL — ABNORMAL LOW (ref 12.0–15.0)
MCH: 30 pg (ref 26.0–34.0)
MCHC: 33.5 g/dL (ref 30.0–36.0)
MCV: 89.5 fL (ref 78.0–100.0)
PLATELETS: 285 10*3/uL (ref 150–400)
RBC: 3.73 MIL/uL — ABNORMAL LOW (ref 3.87–5.11)
RDW: 14.1 % (ref 11.5–15.5)
WBC: 16.3 10*3/uL — AB (ref 4.0–10.5)

## 2016-02-27 LAB — BASIC METABOLIC PANEL
ANION GAP: 14 (ref 5–15)
BUN: 30 mg/dL — ABNORMAL HIGH (ref 6–20)
CALCIUM: 9.3 mg/dL (ref 8.9–10.3)
CO2: 31 mmol/L (ref 22–32)
Chloride: 95 mmol/L — ABNORMAL LOW (ref 101–111)
Creatinine, Ser: 0.89 mg/dL (ref 0.44–1.00)
GFR calc Af Amer: >60 mL/min (ref >60)
GLUCOSE: 106 mg/dL — AB (ref 65–99)
Potassium: 3.1 mmol/L — ABNORMAL LOW (ref 3.5–5.1)
Sodium: 140 mmol/L (ref 135–145)

## 2016-02-27 LAB — GLUCOSE, CAPILLARY
GLUCOSE-CAPILLARY: 131 mg/dL — AB (ref 65–99)
GLUCOSE-CAPILLARY: 118 mg/dL — AB (ref 65–99)
GLUCOSE-CAPILLARY: 139 mg/dL — AB (ref 65–99)
Glucose-Capillary: 103 mg/dL — ABNORMAL HIGH (ref 65–99)

## 2016-02-27 LAB — MAGNESIUM: Magnesium: 2 mg/dL (ref 1.7–2.4)

## 2016-02-27 LAB — PHOSPHORUS: Phosphorus: 3.8 mg/dL (ref 2.5–4.6)

## 2016-02-27 LAB — ECHOCARDIOGRAM COMPLETE
HEIGHTINCHES: 57.5 in
WEIGHTICAEL: 3160 [oz_av]

## 2016-02-27 MED ORDER — PANTOPRAZOLE SODIUM 40 MG PO TBEC
80.0000 mg | DELAYED_RELEASE_TABLET | Freq: Every day | ORAL | Status: DC
  Administered 2016-02-27 – 2016-02-28 (×2): 80 mg via ORAL
  Filled 2016-02-27 (×2): qty 2

## 2016-02-27 MED ORDER — INFLUENZA VAC SPLIT QUAD 0.5 ML IM SUSY
0.5000 mL | PREFILLED_SYRINGE | INTRAMUSCULAR | Status: AC
  Administered 2016-02-29: 0.5 mL via INTRAMUSCULAR
  Filled 2016-02-27 (×2): qty 0.5

## 2016-02-27 MED ORDER — PERFLUTREN LIPID MICROSPHERE
1.0000 mL | INTRAVENOUS | Status: AC | PRN
  Administered 2016-02-27: 2 mL via INTRAVENOUS
  Filled 2016-02-27: qty 10

## 2016-02-27 MED ORDER — SERTRALINE HCL 100 MG PO TABS
100.0000 mg | ORAL_TABLET | Freq: Every day | ORAL | Status: DC
  Administered 2016-02-27 – 2016-02-29 (×3): 100 mg via ORAL
  Filled 2016-02-27 (×3): qty 1

## 2016-02-27 MED ORDER — POTASSIUM CHLORIDE CRYS ER 20 MEQ PO TBCR
30.0000 meq | EXTENDED_RELEASE_TABLET | ORAL | Status: AC
  Administered 2016-02-27 (×2): 30 meq via ORAL
  Filled 2016-02-27 (×3): qty 1

## 2016-02-27 MED ORDER — FUROSEMIDE 10 MG/ML IJ SOLN
20.0000 mg | Freq: Once | INTRAMUSCULAR | Status: AC
  Administered 2016-02-27: 20 mg via INTRAVENOUS

## 2016-02-27 NOTE — Progress Notes (Signed)
eLink Nursing ICU Electrolyte Replacement Protocol  Patient Name: Genia Delancy L Belli DOB: October 18, 1950 MRN: [id number]  Date of Service  02/27/2016   HPI/Events of Note    Recent Labs Lab 02/24/16 1559 02/25/16 0234 02/26/16 0540 02/27/16 0214  NA 136 142 143 140  K 4.0 4.0 3.9 3.1*  CL 99* 107 100* 95*  CO2 23 24 29 31   GLUCOSE 187* 120* 113* 106*  BUN 27* 29* 34* 30*  CREATININE 1.07* 0.86 0.96 0.89  CALCIUM 8.5* 8.7* 9.2 9.3  MG 1.8 1.8 2.0  --   PHOS 4.4 3.3 3.6  --     Estimated Creatinine Clearance: 57.8 mL/min (by C-G formula based on SCr of 0.89 mg/dL).  Intake/Output      11/14 0701 - 11/15 0700   P.O. 470   I.V. (mL/kg) 80 (0.9)   IV Piggyback 125   Total Intake(mL/kg) 675 (7.5)   Urine (mL/kg/hr) 2900 (1.3)   Stool 1 (0)   Total Output 2901   Net -2226        - I/O DETAILED x24h    Total I/O In: -  Out: 1250 [Urine:1250] - I/O THIS SHIFT    ASSESSMENT   eICURN Interventions  K+ 3.1 Replaced using ICU protocol   ASSESSMENT: MAJOR ELECTROLYTE    Merita NortonFrye, Lissete Maestas Nicole 02/27/2016, 5:28 AM

## 2016-02-27 NOTE — Progress Notes (Signed)
  Echocardiogram 2D Echocardiogram has been performed.  Leta JunglingCooper, Lynn Dixon 02/27/2016, 3:11 PM

## 2016-02-27 NOTE — Progress Notes (Signed)
PULMONARY / CRITICAL CARE MEDICINE   Name: Lynn Dixon MRN: [id number] DOB: [birth year]    ADMISSION DATE:  02/24/2016 CONSULTATION DATE:  02/24/2016  REFERRING MD:  Dr. Doyne Keel  CHIEF COMPLAINT:  Shortness of breath  Brief:   This is a 65 year old female with a past medical history significant for COPD who is admitted to [facility] on 02/21/2016 with acute respiratory failure with hypoxemia. The patient was intubated upon arrival to our facility so we could not obtain history directly from her history was all obtained by chart review. Review of records reveals that she presented on 02/21/2016 to the outside hospital where she was noted to have a right upper lobe infiltrate with a fever to 101.4. She was treated for community-acquired pneumonia with antibiotics and BiPAP due to high O2 requirements and increased work of breathing. However, her oxygen needs continue to increase and on 02/24/2016 pulmonary and critical care medicine at [facility] was asked to accept her on transfer due to worsening oxygenation and worsening chest x-ray findings. She was intubated at that point and then transferred to our facility.  Review of the records from [facility] state that she was treated with Levaquin from November 9 through November 11. On November 11 she was changed to Zosyn and azithromycin. She was treated with BiPAP for what appears to be the better part of November 10 and November 11. On November 11 her FiO2 is noted to be 75%. By the morning of 02/24/2016 her chest x-ray was worsening and her oxygen needs were climbing. She was intubated by my recommendation and then transferred to our facility. She was received here on high doses of IV Versed and fentanyl. Of note, she was treated with prednisone as well as furosemide during her hospital stay at [facility].  Imaging Dg Chest [place] 1 View 02/24/2016: The ETT is in good position. The NG tube terminates below today's film.  Persistent bilateral pulmonary opacities, mildly improved in the interval. Electronically Signed   By: Gerome Sam III M.D   On: 02/24/2016 15:23    02/24/2016 ABG preintubation pH 7.44/PCO2 44/PaO2 57/total CO2 31.3 02/24/2016 CBC White blood cell count 12.3, hemoglobin 9.3, hematocrit 28.9, platelet 241 02/24/2016 basic metabolic panel: Sodium 142, potassium 3.9, chloride 100, carbon dioxide 26, BUN 28, creatinine 0.94, glucose 110   STUDIES:  02/22/2016 CT angiogram chest>  no evidence of pulmonary embolism, dense right upper lobe consolidation with scattered groundglass throughout both lungs, there is mild centrilobular emphysema and upper lobe distribution  CULTURES: 11/12 blood >  11/12 resp >    ANTIBIOTICS: 11/9 Levaquin > 11/11 11/11 Zosyn > 11/12 11/12 Azithro > 11/12  11/12 Unasyn>>11/13 11/13 Levaquin >>>stop 15th 11/13 Vanc >>>off   LINES/TUBES: 11/12 ETT >> 11/12 two PIV's> 11/12 foley >> 11/12 OGT >>  Significant events:  11/9: Admitted to [facility] hosp for AHRF due to PNA (RUL infiltrate + fever) 11/12: Intubated and transferred to [facility] due to IWOB and high O2 requirment 11/13: diuresed and started zosyn  11/14: extubated to 4L by [place], cuff leak noted. Narrowed Antibiotics to Levaquin  SUBJECTIVE:  On cpap 5/5 overnight. On [place] this morning. Denies chest pain or shortness of breath. Reports dizziness occasionally.   VITAL SIGNS: BP (!) 100/52   Pulse (!) 53   Temp 98.5 F (36.9 C) (Oral)   Resp 16   Ht 4' 9.5" (1.461 m)   Wt 197 lb 8 oz (89.6 kg)   SpO2 92%  BMI 42.00 kg/m   HEMODYNAMICS:  Not on pressors  VENTILATOR SETTINGS: Vent Mode: PSV;CPAP FiO2 (%):  [40 %-95 %] 94 % PEEP:  [5 cmH20-8 cmH20] 5 cmH20 Pressure Support:  [5 cmH20] 5 cmH20 Vent Mode: PSV;CPAP FiO2 (%):  [40 %-95 %] 94 % PEEP:  [5 cmH20-8 cmH20] 5 cmH20 Pressure Support:  [5 cmH20] 5 cmH20   INTAKE / OUTPUT: I/O last 3 completed shifts: In: 1710 [P.O.:470;  I.V.:375; NG/GT:690; IV Piggyback:175] Out: 4351 [Urine:4350; Stool:1]  PHYSICAL EXAMINATION: General: Comfortable on [place]  Neuro:  AAO x3, follows commands, interactive, moves all 4 extremities HEENT:  Normocephalic atraumatic,  in place Cardiovascular:  Regular rate and rhythm, slightly bradycardic, no murmurs gallops or rubs, no edema Lungs: clear to auscultation, no wheezing, or crackles Abdomen:  Bowel sounds positive, nontender nondistended Musculoskeletal:  Normal bulk and tone Skin:  No edema noted, no rash or skin breakdown  LABS:  BMET  Recent Labs Lab 02/25/16 0234 02/26/16 0540 02/27/16 0214  NA 142 143 140  K 4.0 3.9 3.1*  CL 107 100* 95*  CO2 24 29 31   BUN 29* 34* 30*  CREATININE 0.86 0.96 0.89  GLUCOSE 120* 113* 106*    Electrolytes  Recent Labs Lab 02/24/16 1559 02/25/16 0234 02/26/16 0540 02/27/16 0214  CALCIUM 8.5* 8.7* 9.2 9.3  MG 1.8 1.8 2.0  --   PHOS 4.4 3.3 3.6  --     CBC  Recent Labs Lab 02/25/16 0234 02/26/16 0540 02/27/16 0214  WBC 12.4* 17.6* 16.3*  HGB 9.5* 10.4* 11.2*  HCT 30.0* 32.6* 33.4*  PLT 214 248 285    Coag's No results for input(s): APTT, INR in the last 168 hours.  Sepsis Markers No results for input(s): LATICACIDVEN, PROCALCITON, O2SATVEN in the last 168 hours.  ABG  Recent Labs Lab 02/24/16 1542 02/25/16 0400  PHART 7.385 7.385  PCO2ART 46.8 47.9  PO2ART 68.0* 66.4*    Liver Enzymes  Recent Labs Lab 02/24/16 1559  AST 39  ALT 35  ALKPHOS 66  BILITOT 0.9  ALBUMIN 2.9*    Cardiac Enzymes  Recent Labs Lab 02/24/16 1559 02/24/16 2121 02/25/16 0234  TROPONINI <0.03 <0.03 <0.03    Glucose  Recent Labs Lab 02/26/16 0743 02/26/16 1158 02/26/16 1649 02/26/16 2014 02/27/16 0040 02/27/16 0443  GLUCAP 173* 124* 179* 134* 103* 131*     DISCUSSION: 65 year old female with a past medical history significant for COPD here with acute respiratory failure with hypoxemia secondary to  severe community-acquired pneumonia for which she is treated with antibiotics and improving. She was extubated 11/14.   ASSESSMENT / PLAN:  PULMONARY A: Centrilobular emphysema, unknown baseline pulmonary function Acute respiratory failure with hypoxemia Severe community-acquired pneumonia Obstructive sleep apnea Extubated 11/14 P:   DuoNeb every 6 hours as needed  Wean prednisone to 10mg  Last dose Levaquin 11/15 Hold lasix or reduce IS Nocturnal cpap ( home)  CARDIOVASCULAR A:  No acute issues. Trop and BNP negative Hypertension > now normotensive P:  Hold home lisinopril  lasix. 4 kg down, reduce Continue home Zocor and ASA Tele dc Monitor BP  RENAL A:   No acute issues Fluid -2.9L/-2.1/-3.2L Weight 93>>89.5 kgs since admit HypoK P:   Kdur 30 mEq x2 F/u Mg and P Daily BMET I&O KVO Reduce lasix  GASTROINTESTINAL A:   No acute issues P:   Regular diet D/c pepcid Miralax as needed  HEMATOLOGIC A:   Anemia. Unknown baseline. Stable Leukocytosis likely steroid / hemoconcetration P:  Subcutaneous heparin for DVT prophylaxis, dc as ambulation  As needed CBC  INFECTIOUS A:   Severe CAP Respiratory culture: NGTD Afebrile here Cultures NGTD Leukocytosis likely hemoconcentration.   P:   Levaquin last dose today  ENDOCRINE A:   CBG within range P:   Wean prednisone to off Discontinue SSI  NEUROLOGIC A:   Sedation needs for ventilator synchrony P:   RASS goal:0 Pt consult  FAMILY  - Updates: Husband and son updated bedside on 02/24/2016.    Disposition: tranfer to regular floor  02/27/2016, 7:06 AM  Candelaria Stagersaye Gonfa, MD.  PGY-2, [facility]   STAFF NOTE: I, Rory Percyaniel Feinstein, MD FACP have personally reviewed patient's available data, including medical history, events of note, physical examination and test results as part of my evaluation. I have discussed with resident/NP and other care providers such as pharmacist, RN and  RRT. In addition, I personally evaluated patient and elicited key findings of: in chair on cell phone, no distress, less coarse BS, no new pcxr, resolving  PNA, allow abx to stop today , received course, lasix reduction but likley needs further neg balance, she is NOT on home o2, remains on 5 liters, will need ambul pulse ox in 1-2 days, k supp, kvo, diet, she is walking, dc sub q hep, noct cpap to remain home, I updated pt in room To triad, floor Mcarthur Rossettianiel J. Tyson AliasFeinstein, MD, FACP Pgr: [phone] [place] 02/27/2016 9:14 AM  \

## 2016-02-27 NOTE — CV Procedure (Signed)
Attempted Echocardiogram at 1:22 PM on 02/27/16. The patient requested that the echo be done later she would like to eat lunch.   Lynn Dixon

## 2016-02-28 DIAGNOSIS — G4733 Obstructive sleep apnea (adult) (pediatric): Secondary | ICD-10-CM

## 2016-02-28 DIAGNOSIS — J432 Centrilobular emphysema: Secondary | ICD-10-CM | POA: Diagnosis present

## 2016-02-28 DIAGNOSIS — J441 Chronic obstructive pulmonary disease with (acute) exacerbation: Secondary | ICD-10-CM | POA: Diagnosis present

## 2016-02-28 DIAGNOSIS — E784 Other hyperlipidemia: Secondary | ICD-10-CM

## 2016-02-28 DIAGNOSIS — J181 Lobar pneumonia, unspecified organism: Secondary | ICD-10-CM

## 2016-02-28 DIAGNOSIS — E785 Hyperlipidemia, unspecified: Secondary | ICD-10-CM | POA: Diagnosis present

## 2016-02-28 DIAGNOSIS — E7849 Other hyperlipidemia: Secondary | ICD-10-CM | POA: Diagnosis present

## 2016-02-28 LAB — BASIC METABOLIC PANEL
Anion gap: 10 (ref 5–15)
BUN: 31 mg/dL — AB (ref 6–20)
CALCIUM: 9 mg/dL (ref 8.9–10.3)
CO2: 27 mmol/L (ref 22–32)
CREATININE: 0.91 mg/dL (ref 0.44–1.00)
Chloride: 98 mmol/L — ABNORMAL LOW (ref 101–111)
GFR calc Af Amer: >60 mL/min (ref >60)
GLUCOSE: 116 mg/dL — AB (ref 65–99)
Potassium: 3.7 mmol/L (ref 3.5–5.1)
Sodium: 135 mmol/L (ref 135–145)

## 2016-02-28 LAB — CBC
HEMATOCRIT: 34.6 % — AB (ref 36.0–46.0)
Hemoglobin: 11.1 g/dL — ABNORMAL LOW (ref 12.0–15.0)
MCH: 28.5 pg (ref 26.0–34.0)
MCHC: 32.1 g/dL (ref 30.0–36.0)
MCV: 88.9 fL (ref 78.0–100.0)
PLATELETS: 273 10*3/uL (ref 150–400)
RBC: 3.89 MIL/uL (ref 3.87–5.11)
RDW: 14 % (ref 11.5–15.5)
WBC: 15.7 10*3/uL — AB (ref 4.0–10.5)

## 2016-02-28 MED ORDER — ZOLPIDEM TARTRATE 5 MG PO TABS
5.0000 mg | ORAL_TABLET | Freq: Once | ORAL | Status: DC
  Filled 2016-02-28: qty 1

## 2016-02-28 MED ORDER — ALBUTEROL SULFATE (2.5 MG/3ML) 0.083% IN NEBU: 2.5000 mg | INHALATION_SOLUTION | RESPIRATORY_TRACT | Status: DC | PRN

## 2016-02-28 MED ORDER — PANTOPRAZOLE SODIUM 40 MG PO TBEC
80.0000 mg | DELAYED_RELEASE_TABLET | Freq: Every day | ORAL | Status: DC
  Administered 2016-02-29: 80 mg via ORAL
  Filled 2016-02-28: qty 2

## 2016-02-28 NOTE — Progress Notes (Signed)
PROGRESS NOTE    Lynn Dixon  ZOX:[id number] DOB: 1950/11/06 DOA: 02/24/2016 PCP: Selinda FlavinHOWARD, KEVIN, MD   Brief Narrative:  65 year old WF PMHx Depression, COPD, OSA, HTN, HLD, GERD  Who is [facility] on 02/21/2016 with acute respiratory failure with hypoxemia. The patient was intubated upon arrival to our facility so we could not obtain history directly from her history was all obtained by chart review. Review of records reveals that she presented on 02/21/2016 to the outside hospital where she was noted to have a right upper lobe infiltrate with a fever to 101.4. She was treated for community-acquired pneumonia with antibiotics and BiPAP due to high O2 requirements and increased work of breathing. However, her oxygen needs continue to increase and on 02/24/2016 pulmonary and critical care [facility] was asked to accept her on transfer due to worsening oxygenation and worsening chest x-ray findings. She was intubated at that point and then transferred to our facility.  Review of the records from [facility] state that she was treated with Levaquin from November 9 through November 11. On November 11 she was changed to Zosyn and azithromycin. She was treated with BiPAP for what appears to be the better part of November 10 and November 11. On November 11 her FiO2 is noted to be 75%. By the morning of 02/24/2016 her chest x-ray was worsening and her oxygen needs were climbing. She was intubated by my recommendation and then transferred to our facility. She was received here on high doses of IV Versed and fentanyl. Of note, she was treated with prednisone as well as furosemide during her hospital stay [facility].    Subjective: 11/16 A/O 4, sitting in the chair comfortably. States prior to hospitalization NOT ON home O2. Does use QHS CPAP but unsure of her settings. States husband works all day and her son also works other so she would be at home  by herself for the most part. States was a smoker but stopped 10 years ago.    Assessment & Plan:   Active Problems:   Community acquired pneumonia of left lower lobe of lung (HCC)   Encounter for orogastric (OG) tube placement   Endotracheally intubated   COPD Exacerbation/Centrilobular Emphysema,  -Albuterol when necessary -DuoNeb TID  Acute respiratory failure with hypoxemia/Severe CAP -Ambulatory SPO2 pending -PT/OT evaluation pending for SNF: Pulmonary rehabilitation  Obstructive sleep apnea -CPAP per respiratory  Hypertension -Hold all home BP medication, patient low normotensive    HLD -Zocor 40 mg daily  Anemia. Unknown baseline.  Recent Labs Lab 02/24/16 1559 02/25/16 0234 02/26/16 0540 02/27/16 0214 02/28/16 0243  HGB 9.7* 9.5* 10.4* 11.2* 11.1*  -Stable  Leukocytosis  -likely steroid / hemoconcetration    DVT prophylaxis: SCD Code Status: Full  Family Communication: None Disposition Plan: SNF?     Consultants:  PCCM  Procedures/Significant Events:  11/9: [facility] due to PNA (RUL infiltrate + fever) 11/12: Intub[facility] and [facility] due to IWOB and high O2 requirment 11/13: diuresed and started zosyn  11/14: extubated to 4L by [place], cuff leak noted. Narrowed Antibiotics to Levaquin 11/15 Echocardiogram:Left ventricle:  mild LVH. -LVEF = 55% to 60%. -(grade 1 diastolic dysfunction).      VENTILATOR SETTINGS: Vent Mode: PSV;CPAP FiO2 (%):  [40 %-95 %] 94 % PEEP:  [5 cmH20-8 cmH20] 5 cmH20 Pressure Support:  [5 cmH20] 5 cmH20 Vent Mode: PSV;CPAP FiO2 (%):  [40 %-95 %] 94 %  PEEP:  [5 cmH20-8 cmH20] 5 cmH20 Pressure Support:  [5 cmH20] 5 cmH20   Cultures 11/12 tracheal aspirate negative 11/12 blood 2 negative   Antimicrobials: 11/9 Levaquin > 11/11 11/11 Zosyn > 11/12 11/12 Azithro > 11/12   Devices    LINES / TUBES:  11/12 ETT >> 11/12 two PIV's> 11/12 foley >> 11/12 OGT  >>    Continuous Infusions:   Objective: Vitals:   02/27/16 2147 02/28/16 0221 02/28/16 0812 02/28/16 0815  BP: 100/72 (!) 102/57    Pulse: 66     Resp: 18     Temp:  97.5 F (36.4 C) 98.4 F (36.9 C)   TempSrc:  Axillary Oral   SpO2: 96%   97%  Weight:      Height:        Intake/Output Summary (Last 24 hours) at 02/28/16 0827 Last data filed at 02/27/16 1700  Gross per 24 hour  Intake              100 ml  Output              300 ml  Net             -200 ml   Filed Weights   02/25/16 0256 02/26/16 0410 02/27/16 0424  Weight: 93.4 kg (205 lb 14.6 oz) 92.3 kg (203 lb 7.8 oz) 89.6 kg (197 lb 8 oz)    Examination:  General:A/O 4, sitting in the chair comfortably., No acute respiratory distress Eyes: negative scleral hemorrhage, negative anisocoria, negative icterus ENT: Negative Runny nose, negative gingival bleeding, Neck:  Negative scars, masses, torticollis, lymphadenopathy, JVD Lungs: poor/absent air movement biapical Rt worse than the Lt, mild bibasilar crackles.  without wheezes  Cardiovascular: Regular rate and rhythm without murmur gallop or rub normal S1 and S2 Abdomen: negative abdominal pain, nondistended, positive soft, bowel sounds, no rebound, no ascites, no appreciable mass Extremities: No significant cyanosis, clubbing, or edema bilateral lower extremities Skin: Negative rashes, lesions, ulcers Psychiatric:  Negative depression, negative anxiety, negative fatigue, negative mania  Central nervous system:  Cranial nerves II through XII intact, tongue/uvula midline, all extremities muscle strength 5/5, sensation intact throughout, negative dysarthria, negative expressive aphasia, negative receptive aphasia.  .     Data Reviewed: Care during the described time interval was provided by me .  I have reviewed this patient's available data, including medical history, events of note, physical examination, and all test results as part of my evaluation. I have  personally reviewed and interpreted all radiology studies.  CBC:  Recent Labs Lab 02/24/16 1559 02/25/16 0234 02/26/16 0540 02/27/16 0214 02/28/16 0243  WBC 13.7* 12.4* 17.6* 16.3* 15.7*  HGB 9.7* 9.5* 10.4* 11.2* 11.1*  HCT 29.9* 30.0* 32.6* 33.4* 34.6*  MCV 90.3 91.2 90.3 89.5 88.9  PLT PLATELET CLUMPS NOTED ON SMEAR, COUNT APPEARS ADEQUATE 214 248 285 273   Basic Metabolic Panel:  Recent Labs Lab 02/24/16 1559 02/25/16 0234 02/26/16 0540 02/27/16 0214 02/28/16 0243  NA 136 142 143 140 135  K 4.0 4.0 3.9 3.1* 3.7  CL 99* 107 100* 95* 98*  CO2 23 24 29 31 27   GLUCOSE 187* 120* 113* 106* 116*  BUN 27* 29* 34* 30* 31*  CREATININE 1.07* 0.86 0.96 0.89 0.91  CALCIUM 8.5* 8.7* 9.2 9.3 9.0  MG 1.8 1.8 2.0 2.0  --   PHOS 4.4 3.3 3.6 3.8  --    GFR: Estimated Creatinine Clearance: 56.5 mL/min (by C-G formula based on SCr  of 0.91 mg/dL). Liver Function Tests:  Recent Labs Lab 02/24/16 1559  AST 39  ALT 35  ALKPHOS 66  BILITOT 0.9  PROT 7.1  ALBUMIN 2.9*   No results for input(s): LIPASE, AMYLASE in the last 168 hours. No results for input(s): AMMONIA in the last 168 hours. Coagulation Profile: No results for input(s): INR, PROTIME in the last 168 hours. Cardiac Enzymes:  Recent Labs Lab 02/24/16 1559 02/24/16 2121 02/25/16 0234  TROPONINI <0.03 <0.03 <0.03   BNP (last 3 results) No results for input(s): PROBNP in the last 8760 hours. HbA1C: No results for input(s): HGBA1C in the last 72 hours. CBG:  Recent Labs Lab 02/26/16 2014 02/27/16 0040 02/27/16 0443 02/27/16 0758 02/27/16 1519  GLUCAP 134* 103* 131* 118* 139*   Lipid Profile: No results for input(s): CHOL, HDL, LDLCALC, TRIG, CHOLHDL, LDLDIRECT in the last 72 hours. Thyroid Function Tests: No results for input(s): TSH, T4TOTAL, FREET4, T3FREE, THYROIDAB in the last 72 hours. Anemia Panel: No results for input(s): VITAMINB12, FOLATE, FERRITIN, TIBC, IRON, RETICCTPCT in the last 72  hours. Urine analysis:    Component Value Date/Time   COLORURINE RED (A) 11/29/2012 1840   APPEARANCEUR CLOUDY (A) 11/29/2012 1840   LABSPEC 1.020 11/29/2012 1840   PHURINE 5.5 11/29/2012 1840   GLUCOSEU NEGATIVE 11/29/2012 1840   HGBUR LARGE (A) 11/29/2012 1840   BILIRUBINUR SMALL (A) 11/29/2012 1840   KETONESUR NEGATIVE 11/29/2012 1840   PROTEINUR 100 (A) 11/29/2012 1840   UROBILINOGEN 1.0 11/29/2012 1840   NITRITE POSITIVE (A) 11/29/2012 1840   LEUKOCYTESUR LARGE (A) 11/29/2012 1840   Sepsis Labs: @LABRCNTIP (procalcitonin:4,lacticidven:4)  ) Recent Results (from the past 240 hour(s))  MRSA PCR Screening     Status: None   Collection Time: 02/24/16  2:37 PM  Result Value Ref Range Status   MRSA by PCR NEGATIVE NEGATIVE Final    Comment:        The GeneXpert MRSA Assay (FDA approved for NASAL specimens only), is one component of a comprehensive MRSA colonization surveillance program. It is not intended to diagnose MRSA infection nor to guide or monitor treatment for MRSA infections.   Culture, respiratory (NON-Expectorated)     Status: None   Collection Time: 02/24/16  3:12 PM  Result Value Ref Range Status   Specimen Description TRACHEAL ASPIRATE  Final   Special Requests NONE  Final   Gram Stain   Final    MODERATE WBC PRESENT,BOTH PMN AND MONONUCLEAR NO ORGANISMS SEEN    Culture NO GROWTH 2 DAYS  Final   Report Status 02/26/2016 FINAL  Final  Culture, blood (Routine X 2) w Reflex to ID Panel     Status: None (Preliminary result)   Collection Time: 02/24/16  3:51 PM  Result Value Ref Range Status   Specimen Description BLOOD BLOOD RIGHT HAND  Final   Special Requests IN PEDIATRIC BOTTLE 1CC  Final   Culture NO GROWTH 3 DAYS  Final   Report Status PENDING  Incomplete  Culture, blood (Routine X 2) w Reflex to ID Panel     Status: None (Preliminary result)   Collection Time: 02/24/16  3:58 PM  Result Value Ref Range Status   Specimen Description BLOOD BLOOD  RIGHT HAND  Final   Special Requests IN PEDIATRIC BOTTLE 1CC  Final   Culture NO GROWTH 3 DAYS  Final   Report Status PENDING  Incomplete         Radiology Studies: No results found.  Scheduled Meds: . aspirin  81 mg Oral Daily  . gabapentin  300 mg Oral TID  . Influenza vac split quadrivalent PF  0.5 mL Intramuscular Tomorrow-1000  . ipratropium-albuterol  3 mL Nebulization TID  . mouth rinse  15 mL Mouth Rinse BID  . pantoprazole  80 mg Oral Daily  . polyethylene glycol  17 g Oral Daily  . sertraline  100 mg Oral Daily  . simvastatin  40 mg Oral QHS   Continuous Infusions:   LOS: 4 days    Time spent: 40 minutes    Avyana Puffenbarger, Roselind Messier, MD [facility] Pager [phone]   If 7PM-7AM, please contact night-coverage [email or web address] Password [email or web address] 02/28/2016, 8:27 AM

## 2016-02-28 NOTE — Care Management Important Message (Signed)
Important Message  Patient Details  Name: Lynn Dixon MRN: [id number] Date of Birth: 21-Mar-1951   Medicare Important Message Given:  Yes    Kyla BalzarineShealy, Thelia Tanksley Abena 02/28/2016, 9:24 AM

## 2016-02-28 NOTE — Evaluation (Signed)
Physical Therapy Evaluation Patient Details Name: Genia Delancy L Hladik MRN: [id number] DOB: May 04, 1950 Today's Date: 02/28/2016   History of Present Illness  Patient is a 65 year old female [facility] 11/9 with LLL PNA, 11/12 oxygenattion worsened, [facility]ferr[facility] and intubated 11/12-11/14. PMHx: COPD, shortness of breath, HLD, HTN, Rt TKA and sleep apnea.   Clinical Impression  Patient presents with decreased activity tolerance, decreased ambulation, and hip weakness, and will benefit from acute physical therapy to address these impairments to maximize independence and decrease burden of care. During the session today, the patient presented with a pleasant demeanor and willingness to try walking even though she has not done much walking since admission. During walking, her oxygen demands increased to 6 L O2 and had significant shortness of breath and fatigue at approximately 15 feet. Patient noted pain during hip flexion muscle testing, due to sciatic involvement.   Supine: HR: 66 O2 Sat: 97%  Sitting: HR:65 SPO2: 97      Follow Up Recommendations Home health PT;Supervision - Intermittent    Equipment Recommendations       Recommendations for Other Services OT consult     Precautions / Restrictions        Mobility  Bed Mobility Overal bed mobility: Modified Independent             General bed mobility comments: Patient was slowed in moving to EOB   Transfers Overall transfer level: Needs assistance   Transfers: Sit to/from Stand Sit to Stand: Supervision         General transfer comment: cues for hand placement and safety. Patient has tendency to grasp for walker.   Ambulation/Gait Ambulation/Gait assistance: Min guard Ambulation Distance (Feet): 30 Feet (Walked approximately 5 feet, and had to sit down in recliner for 2 minutes. Then able to walk 30 feet with 1 standing rest and noted shortness of breath. ) Assistive device: Rolling walker (2  wheeled);1 person hand held assist Gait Pattern/deviations: Step-through pattern;Trunk flexed Gait velocity: decreased  Gait velocity interpretation: Below normal speed for age/gender General Gait Details: leaned slightly towards the right side when walking. Min guard for safety, with cues for posture and walker use.    Stairs            Wheelchair Mobility    Modified Rankin (Stroke Patients Only)       Balance Overall balance assessment: Needs assistance   Sitting balance-Leahy Scale: Good       Standing balance-Leahy Scale: Fair                               Pertinent Vitals/Pain Pain Assessment: No/denies pain    Home Living Family/patient expects to be discharged to:: Private residence Living Arrangements: Spouse/significant other   Type of Home: House Home Access: Stairs to enter Entrance Stairs-Rails: Can reach both Entrance Stairs-Number of Steps: 6 Home Layout: One level Home Equipment: Cane - single point;Wheelchair - manual;Bedside commode;Walker - 2 wheels (is not using any of this equipment at this time ) Additional Comments: Due to fatigue, patient had been getting into tub for bathing prior to admission with assistance from husband.     Prior Function Level of Independence: Independent (cook and clean, gardening)               Hand Dominance   Dominant Hand: Left    Extremity/Trunk Assessment  Lower Extremity Assessment: RLE deficits/detail;LLE deficits/detail RLE Deficits / Details: Hip flexion 3+/5, Knee extension 5/5, knee flexion 5/5, dorsiflexion 5/5  LLE Deficits / Details: hip flexion 3+/5, knee extension 5/5, knee flexion 5/5, dorsiflexion 5/5      Communication   Communication: No difficulties  Cognition Arousal/Alertness: Awake/alert Behavior During Therapy: WFL for tasks assessed/performed Overall Cognitive Status: Within Functional Limits for tasks assessed                       General Comments      Exercises     Assessment/Plan    PT Assessment Patient needs continued PT services  PT Problem List Decreased activity tolerance;Decreased mobility;Decreased strength;Decreased knowledge of use of DME;Cardiopulmonary status limiting activity          PT Treatment Interventions Gait training;Functional mobility training;Therapeutic exercise;DME instruction;Stair training;Patient/family education;Therapeutic activities    PT Goals (Current goals can be found in the Care Plan section)  Acute Rehab PT Goals Patient Stated Goal: to be able to walk to her carport  PT Goal Formulation: With patient/family Time For Goal Achievement: 03/13/16 Potential to Achieve Goals: Good    Frequency Min 3X/week   Barriers to discharge        Co-evaluation               End of Session Equipment Utilized During Treatment: Gait belt;Oxygen Activity Tolerance: Patient tolerated treatment well;No increased pain;Patient limited by fatigue (also limited due to shortness of breath. ) Patient left: in chair;with call bell/phone within reach           Time: 0913-0941 PT Time Calculation (min) (ACUTE ONLY): 28 min   Charges:   PT Evaluation $PT Eval Moderate Complexity: 1 Procedure     PT G Codes:        Zaydin Billey 02/28/2016, 10:26 AM  Trendon Zaring D. Roshanna Cimino SPT [phone]

## 2016-02-29 LAB — CULTURE, BLOOD (ROUTINE X 2)
Culture: NO GROWTH
Culture: NO GROWTH

## 2016-02-29 LAB — BASIC METABOLIC PANEL
ANION GAP: 12 (ref 5–15)
BUN: 28 mg/dL — ABNORMAL HIGH (ref 6–20)
CALCIUM: 9.2 mg/dL (ref 8.9–10.3)
CHLORIDE: 100 mmol/L — AB (ref 101–111)
CO2: 26 mmol/L (ref 22–32)
Creatinine, Ser: 0.96 mg/dL (ref 0.44–1.00)
GFR calc non Af Amer: 60 mL/min — ABNORMAL LOW (ref >60)
GLUCOSE: 112 mg/dL — AB (ref 65–99)
Potassium: 4.1 mmol/L (ref 3.5–5.1)
Sodium: 138 mmol/L (ref 135–145)

## 2016-02-29 LAB — CBC
HEMATOCRIT: 33.8 % — AB (ref 36.0–46.0)
HEMOGLOBIN: 10.9 g/dL — AB (ref 12.0–15.0)
MCH: 28.8 pg (ref 26.0–34.0)
MCHC: 32.2 g/dL (ref 30.0–36.0)
MCV: 89.2 fL (ref 78.0–100.0)
Platelets: 299 10*3/uL (ref 150–400)
RBC: 3.79 MIL/uL — ABNORMAL LOW (ref 3.87–5.11)
RDW: 14.2 % (ref 11.5–15.5)
WBC: 16.8 10*3/uL — ABNORMAL HIGH (ref 4.0–10.5)

## 2016-02-29 MED ORDER — ALBUTEROL SULFATE HFA 108 (90 BASE) MCG/ACT IN AERS: 2.0000 | INHALATION_SPRAY | Freq: Four times a day (QID) | RESPIRATORY_TRACT | 2 refills | Status: DC | PRN

## 2016-02-29 MED ORDER — BUDESONIDE-FORMOTEROL FUMARATE 160-4.5 MCG/ACT IN AERO: 2.0000 | INHALATION_SPRAY | Freq: Two times a day (BID) | RESPIRATORY_TRACT | 12 refills | Status: DC

## 2016-02-29 NOTE — Evaluation (Signed)
Occupational Therapy Evaluation Patient Details Name: Genia Delancy L Wales MRN: [id number] DOB: [birth year] Today's Date: 02/29/2016    History of Present Illness Patient is a 65 year old female [facility] 11/9 with LLL PNA, 11/12 oxygenattion worsened, [facility] and intubated 11/12-11/14. PMHx: COPD, shortness of breath, HLD, HTN, Rt TKA and sleep apnea.    Clinical Impression   Pt is at sup level with ADLs and ADL mobility. Pt able to perform higher level ADL and ADL mobility tasks while standing with RW with sup as well. Pt educated on energy conservation, using reacher, tub seat/bench for home safety. All education completed and no further acute OT indicated at this time    Follow Up Recommendations  No OT follow up;Supervision - Intermittent    Equipment Recommendations  Tub/shower bench;Other (comment) Lexicographer(reacher)    Recommendations for Other Services       Precautions / Restrictions Precautions Precautions: None Restrictions Weight Bearing Restrictions: No      Mobility Bed Mobility               General bed mobility comments: pt up in recliner  Transfers Overall transfer level: Needs assistance Equipment used: Rolling walker (2 wheeled);None Transfers: Sit to/from Stand Sit to Stand: Supervision              Balance     Sitting balance-Leahy Scale: Good       Standing balance-Leahy Scale: Fair Standing balance comment: Pt stood at closet and drawers to retrieve and put away items with sup using RW                            ADL Overall ADL's : Needs assistance/impaired     Grooming: Wash/dry hands;Wash/dry face;Supervision/safety;Standing   Upper Body Bathing: Supervision/ safety;Standing   Lower Body Bathing: Supervison/ safety;Sit to/from stand   Upper Body Dressing : Supervision/safety;Standing   Lower Body Dressing: Supervision/safety;Sit to/from stand   Toilet Transfer:  Supervision/safety;RW;Ambulation   Toileting- ArchitectClothing Manipulation and Hygiene: Supervision/safety;Sit to/from stand   Tub/ Shower Transfer: Supervision/safety;3 in [employer];Rolling walker;Grab bars   Functional mobility during ADLs: Supervision/safety General ADL Comments: pt requires cues for pacing her activities. Educated on energy conservation      Vision Vision Assessment?: No apparent visual deficits              Pertinent Vitals/Pain Pain Assessment: No/denies pain     Hand Dominance Left   Extremity/Trunk Assessment Upper Extremity Assessment Upper Extremity Assessment: Overall WFL for tasks assessed   Lower Extremity Assessment Lower Extremity Assessment: Defer to PT evaluation   Cervical / Trunk Assessment Cervical / Trunk Assessment: Normal   Communication Communication Communication: No difficulties   Cognition Arousal/Alertness: Awake/alert Behavior During Therapy: WFL for tasks assessed/performed Overall Cognitive Status: Within Functional Limits for tasks assessed                     General Comments   pt very pleasant and cooperative                 Home Living Family/patient expects to be discharged to:: Private residence Living Arrangements: Spouse/significant other Available Help at Discharge: Family Type of Home: House Home Access: Stairs to enter Secretary/administratorntrance Stairs-Number of Steps: 6 Entrance Stairs-Rails: Can reach both Home Layout: One level     Bathroom Shower/Tub: Chief Strategy OfficerTub/shower unit   Bathroom Toilet: Standard     Home Equipment: [employer] Service[employer] CompanyCane -  single point;Wheelchair - manual;Bedside commode;Walker - 2 wheels   Additional Comments: Due to fatigue, patient had been getting into tub for bathing prior to admission with assistance from husband.       Prior Functioning/Environment Level of Independence: Independent                 OT Problem List: Decreased activity tolerance;Impaired balance (sitting and/or standing)   OT  Treatment/Interventions:      OT Goals(Current goals can be found in the care plan section) Acute Rehab OT Goals Patient Stated Goal: go home today OT Goal Formulation: With patient  OT Frequency:     Barriers to D/C:    no barriers                     End of Session Equipment Utilized During Treatment: Rolling walker  Activity Tolerance: Patient tolerated treatment well Patient left: in chair   Time: [id number] OT Time Calculation (min): 34 min Charges:  OT General Charges $OT Visit: 1 Procedure OT Evaluation $OT Eval Moderate Complexity: 1 Procedure OT Treatments $Self Care/Home Management : 8-22 mins $Therapeutic Activity: 8-22 mins G-Codes:    Galen ManilaSpencer, Nafisa Olds Jeanette 02/29/2016, 12:17 PM

## 2016-02-29 NOTE — Discharge Instructions (Signed)
Community-Acquired Pneumonia, Adult °Pneumonia is an infection of the lungs. There are different types of pneumonia. One type can develop while a person is in a hospital. A different type, called community-acquired pneumonia, develops in people who are not, or have not recently been, in the hospital or other health care facility. °What are the causes? °Pneumonia may be caused by bacteria, viruses, or funguses. Community-acquired pneumonia is often caused by Streptococcus pneumonia bacteria. These bacteria are often passed from one person to another by breathing in droplets from the cough or sneeze of an infected person. °What increases the risk? °The condition is more likely to develop in: °· People who have chronic diseases, such as chronic obstructive pulmonary disease (COPD), asthma, congestive heart failure, cystic fibrosis, diabetes, or kidney disease. °· People who have early-stage or late-stage HIV. °· People who have sickle cell disease. °· People who have had their spleen removed (splenectomy). °· People who have poor dental hygiene. °· People who have medical conditions that increase the risk of breathing in (aspirating) secretions their own mouth and nose. °· People who have a weakened immune system (immunocompromised). °· People who smoke. °· People who travel to areas where pneumonia-causing germs commonly exist. °· People who are around animal habitats or animals that have pneumonia-causing germs, including birds, bats, rabbits, cats, and farm animals. ° °What are the signs or symptoms? °Symptoms of this condition include: °· A dry cough. °· A wet (productive) cough. °· Fever. °· Sweating. °· Chest pain, especially when breathing deeply or coughing. °· Rapid breathing or difficulty breathing. °· Shortness of breath. °· Shaking chills. °· Fatigue. °· Muscle aches. ° °How is this diagnosed? °Your health care provider will take a medical history and perform a physical exam. You may also have other tests,  including: °· Imaging studies of your chest, including X-rays. °· Tests to check your blood oxygen level and other blood gases. °· Other tests on blood, mucus (sputum), fluid around your lungs (pleural fluid), and urine. ° °If your pneumonia is severe, other tests may be done to identify the specific cause of your illness. °How is this treated? °The type of treatment that you receive depends on many factors, such as the cause of your pneumonia, the medicines you take, and other medical conditions that you have. For most adults, treatment and recovery from pneumonia may occur at home. In some cases, treatment must happen in a hospital. Treatment may include: °· Antibiotic medicines, if the pneumonia was caused by bacteria. °· Antiviral medicines, if the pneumonia was caused by a virus. °· Medicines that are given by mouth or through an IV tube. °· Oxygen. °· Respiratory therapy. ° °Although rare, treating severe pneumonia may include: °· Mechanical ventilation. This is done if you are not breathing well on your own and you cannot maintain a safe blood oxygen level. °· Thoracentesis. This procedure removes fluid around one lung or both lungs to help you breathe better. ° °Follow these instructions at home: °· Take over-the-counter and prescription medicines only as told by your health care provider. °? Only take cough medicine if you are losing sleep. Understand that cough medicine can prevent your body’s natural ability to remove mucus from your lungs. °? If you were prescribed an antibiotic medicine, take it as told by your health care provider. Do not stop taking the antibiotic even if you start to feel better. °· Sleep in a semi-upright position at night. Try sleeping in a reclining chair, or place a few pillows under your head. °· Do not use tobacco products, including cigarettes, chewing   tobacco, and e-cigarettes. If you need help quitting, ask your health care provider. °· Drink enough water to keep your urine  clear or pale yellow. This will help to thin out mucus secretions in your lungs. °How is this prevented? °There are ways that you can decrease your risk of developing community-acquired pneumonia. Consider getting a pneumococcal vaccine if: °· You are older than 67 years of age. °· You are older than 67 years of age and are undergoing cancer treatment, have chronic lung disease, or have other medical conditions that affect your immune system. Ask your health care provider if this applies to you. ° °There are different types and schedules of pneumococcal vaccines. Ask your health care provider which vaccination option is best for you. °You may also prevent community-acquired pneumonia if you take these actions: °· Get an influenza vaccine every year. Ask your health care provider which type of influenza vaccine is best for you. °· Go to the dentist on a regular basis. °· Wash your hands often. Use hand sanitizer if soap and water are not available. ° °Contact a health care provider if: °· You have a fever. °· You are losing sleep because you cannot control your cough with cough medicine. °Get help right away if: °· You have worsening shortness of breath. °· You have increased chest pain. °· Your sickness becomes worse, especially if you are an older adult or have a weakened immune system. °· You cough up blood. °This information is not intended to replace advice given to you by your health care provider. Make sure you discuss any questions you have with your health care provider. °Document Released: 03/31/2005 Document Revised: 08/09/2015 Document Reviewed: 07/26/2014 °Elsevier Interactive Patient Education © 2017 Elsevier Inc. ° °

## 2016-02-29 NOTE — Progress Notes (Signed)
SATURATION QUALIFICATIONS: (This note is used to comply with regulatory documentation for home oxygen)  Patient Saturations on Room Air at Rest = 92%  Patient Saturations on Room Air while Ambulating = 89-92%

## 2016-02-29 NOTE — Discharge Summary (Signed)
Physician Discharge Summary  Lynn Dixon [id number] DOB: July 06, 1950 DOA: 02/24/2016  PCP: Selinda FlavinHOWARD, KEVIN, MD  Admit date: 02/24/2016 Discharge date: 02/29/2016  Admitted From: Home Discharge disposition: Home   Recommendations for Outpatient Follow-Up:   1. F/U with PCP in 1-2 weeks for hospital follow up.   Discharge Diagnosis:   Principal Problem:    Community acquired pneumonia of left lower lobe of lung (HCC) Active Problems:    Encounter for orogastric (OG) tube placement    Endotracheally intubated    COPD exacerbation (HCC)    Centrilobular emphysema (HCC)    OSA (obstructive sleep apnea)    Other hyperlipidemia   Discharge Condition: Improved.  Diet recommendation: Low sodium, heart healthy.    History of Present Illness:   Lynn Dixon is an 65 y.o. female who was [facility]21[facility]16 with acute respiratory failure and hypoxemia, subsequently intubated and [facility] with a source of her respiratory failure thought to be secondary to community acquired pneumonia which had been treated with Levaquin prior to admission here. She was extubated 02/26/16.  Hospital Course by Problem:   Principal Problem:   Community acquired pneumonia of left lower lobe of lung (HCC) complicated by acute respiratory failure requiring endotracheal intubation Treated with Levaquin 02/21/16-02/23/16 followed by Zosyn/azithromycin through 02/24/16 followed by Unasyn through 02/25/16 then placed back on Levaquin through the 15th. Initially treated with BiPAP however she continued to have increased work of breathing with a drop in her oxygen saturations necessitating intubation and transfer to our hospital. She was extubated to 4 L of nasal cannula on 02/26/16. She completed a course of antibiotics 02/27/16. Her oxygen was weaned and prior to discharge, she was able to ambulate maintaining an oxygen saturation of 89% or greater. Blood cultures and  tracheal aspirate cultures were negative.  Active Problems:   COPD exacerbation (HCC)/centrilobar emphysema No wheeze at discharge. She can continue her outpatient dose of Symbicort and albuterol as needed.    OSA (obstructive sleep apnea) CPAP provided per respiratory at night.    Other hyperlipidemia On Zocor.    Medical Consultants:    PCCM   Discharge Exam:   Vitals:   02/29/16 0537 02/29/16 1000  BP: 99/63 114/75  Pulse: 61 74  Resp: 19 20  Temp: 97.6 F (36.4 C) 98.4 F (36.9 C)   Vitals:   02/29/16 0537 02/29/16 0937 02/29/16 1000 02/29/16 1316  BP: 99/63  114/75   Pulse: 61  74   Resp: 19  20   Temp: 97.6 F (36.4 C)  98.4 F (36.9 C)   TempSrc: Oral  Oral   SpO2: 96% 96% 98% 99%  Weight:      Height:        General exam: Appears calm and comfortable. Sitting up in chair. Respiratory system: Clear to auscultation. Respiratory effort normal. Cardiovascular system: S1 & S2 heard, RRR. No JVD,  rubs, gallops or clicks. No murmurs. Gastrointestinal system: Abdomen is nondistended, soft and nontender. No organomegaly or masses felt. Normal bowel sounds heard. Central nervous system: Alert and oriented. No focal neurological deficits. Extremities: No clubbing,  or cyanosis. No edema. Skin: No rashes, lesions or ulcers. Psychiatry: Judgement and insight appear normal. Mood & affect appropriate.    The results of significant diagnostics from this hospitalization (including imaging, microbiology, ancillary and laboratory) are listed below for reference.     Procedures and Diagnostic Studies:   Dg Chest Port 1 View  Result Date:  02/25/2016 CLINICAL DATA:  Hypoxia EXAM: PORTABLE CHEST 1 VIEW COMPARISON:  February 24, 2016 FINDINGS: Endotracheal tube tip is 4.5 cm above the carina. Nasogastric tube tip side port are below the diaphragm. No pneumothorax. There is interstitial and patchy alveolar pulmonary edema bilaterally. There is mild cardiomegaly with  pulmonary venous hypertension. There is atherosclerotic calcification in the aorta. No evident adenopathy. No bone lesions. IMPRESSION: Tube and catheter positions as described without pneumothorax. Evidence of a degree of congestive heart failure. Superimposed pneumonia is questioned, particularly in the lower lung zones. There is aortic atherosclerosis. Electronically Signed   By: Bretta Bang III M.D.   On: 02/25/2016 07:07   Dg Chest Port 1 View  Result Date: 02/24/2016 CLINICAL DATA:  Evaluate ETT. EXAM: PORTABLE CHEST 1 VIEW COMPARISON:  February 24, 2016 FINDINGS: The ETT has been advanced and terminates in the mid trachea. The NG tube terminates below today's film. Diffuse bilateral pulmonary opacities persist but are mildly improved, particularly on the right. No other interval changes. IMPRESSION: The ETT is in good position. The NG tube terminates below today's film. Persistent bilateral pulmonary opacities, mildly improved in the interval. Electronically Signed   By: Gerome Sam III M.D   On: 02/24/2016 15:23   Dg Abd Portable 1v  Result Date: 02/25/2016 CLINICAL DATA:  Nasogastric tube placement EXAM: PORTABLE ABDOMEN - 1 VIEW COMPARISON:  None. FINDINGS: Nasogastric tube tip and side port in stomach. The bowel gas pattern is unremarkable. No bowel obstruction or free air evident. Visualized bases clear. Postoperative change in the lumbar region. IMPRESSION: Nasogastric tube tip and side port in stomach. Bowel gas pattern unremarkable without demonstrable bowel obstruction or free air. Electronically Signed   By: Bretta Bang III M.D.   On: 02/25/2016 12:36     Labs:   Basic Metabolic Panel:  Recent Labs Lab 02/24/16 1559 02/25/16 0234 02/26/16 0540 02/27/16 0214 02/28/16 0243 02/29/16 0530  NA 136 142 143 140 135 138  K 4.0 4.0 3.9 3.1* 3.7 4.1  CL 99* 107 100* 95* 98* 100*  CO2 23 24 29 31 27 26   GLUCOSE 187* 120* 113* 106* 116* 112*  BUN 27* 29* 34* 30*  31* 28*  CREATININE 1.07* 0.86 0.96 0.89 0.91 0.96  CALCIUM 8.5* 8.7* 9.2 9.3 9.0 9.2  MG 1.8 1.8 2.0 2.0  --   --   PHOS 4.4 3.3 3.6 3.8  --   --    GFR Estimated Creatinine Clearance: 54.6 mL/min (by C-G formula based on SCr of 0.96 mg/dL). Liver Function Tests:  Recent Labs Lab 02/24/16 1559  AST 39  ALT 35  ALKPHOS 66  BILITOT 0.9  PROT 7.1  ALBUMIN 2.9*    CBC:  Recent Labs Lab 02/25/16 0234 02/26/16 0540 02/27/16 0214 02/28/16 0243 02/29/16 0530  WBC 12.4* 17.6* 16.3* 15.7* 16.8*  HGB 9.5* 10.4* 11.2* 11.1* 10.9*  HCT 30.0* 32.6* 33.4* 34.6* 33.8*  MCV 91.2 90.3 89.5 88.9 89.2  PLT 214 248 285 273 299   Cardiac Enzymes:  Recent Labs Lab 02/24/16 1559 02/24/16 2121 02/25/16 0234  TROPONINI <0.03 <0.03 <0.03   BNP: Invalid input(s): POCBNP CBG:  Recent Labs Lab 02/26/16 2014 02/27/16 0040 02/27/16 0443 02/27/16 0758 02/27/16 1519  GLUCAP 134* 103* 131* 118* 139*   Microbiology Recent Results (from the past 240 hour(s))  MRSA PCR Screening     Status: None   Collection Time: 02/24/16  2:37 PM  Result Value Ref Range Status   MRSA  by PCR NEGATIVE NEGATIVE Final    Comment:        The GeneXpert MRSA Assay (FDA approved for NASAL specimens only), is one component of a comprehensive MRSA colonization surveillance program. It is not intended to diagnose MRSA infection nor to guide or monitor treatment for MRSA infections.   Culture, respiratory (NON-Expectorated)     Status: None   Collection Time: 02/24/16  3:12 PM  Result Value Ref Range Status   Specimen Description TRACHEAL ASPIRATE  Final   Special Requests NONE  Final   Gram Stain   Final    MODERATE WBC PRESENT,BOTH PMN AND MONONUCLEAR NO ORGANISMS SEEN    Culture NO GROWTH 2 DAYS  Final   Report Status 02/26/2016 FINAL  Final  Culture, blood (Routine X 2) w Reflex to ID Panel     Status: None   Collection Time: 02/24/16  3:51 PM  Result Value Ref Range Status   Specimen  Description BLOOD BLOOD RIGHT HAND  Final   Special Requests IN PEDIATRIC BOTTLE 1CC  Final   Culture NO GROWTH 5 DAYS  Final   Report Status 02/29/2016 FINAL  Final  Culture, blood (Routine X 2) w Reflex to ID Panel     Status: None   Collection Time: 02/24/16  3:58 PM  Result Value Ref Range Status   Specimen Description BLOOD BLOOD RIGHT HAND  Final   Special Requests IN PEDIATRIC BOTTLE 1CC  Final   Culture NO GROWTH 5 DAYS  Final   Report Status 02/29/2016 FINAL  Final     Discharge Instructions:   Discharge Instructions    Call MD for:  difficulty breathing, headache or visual disturbances    Complete by:  As directed    Call MD for:  extreme fatigue    Complete by:  As directed    Diet - low sodium heart healthy    Complete by:  As directed    Increase activity slowly    Complete by:  As directed        Medication List    STOP taking these medications   ciprofloxacin 500 MG tablet Commonly known as:  CIPRO   docusate sodium 100 MG capsule Commonly known as:  COLACE   temazepam 15 MG capsule Commonly known as:  RESTORIL     TAKE these medications   albuterol 108 (90 Base) MCG/ACT inhaler Commonly known as:  PROVENTIL HFA;VENTOLIN HFA Inhale 2 puffs into the lungs every 6 (six) hours as needed for wheezing or shortness of breath.   aspirin 81 MG chewable tablet Chew 81 mg by mouth daily.   budesonide-formoterol 160-4.5 MCG/ACT inhaler Commonly known as:  SYMBICORT Inhale 2 puffs into the lungs 2 (two) times daily.   eszopiclone 2 MG Tabs tablet Commonly known as:  LUNESTA Take 1 mg by mouth at bedtime. Take immediately before bedtime   fish oil-omega-3 fatty acids 1000 MG capsule Take 2 g by mouth 2 (two) times daily.   furosemide 40 MG tablet Commonly known as:  LASIX Take 40 mg by mouth daily.   gabapentin 300 MG capsule Commonly known as:  NEURONTIN Take 300 mg by mouth 3 (three) times daily.   GLUCOSAMINE-CHONDROITIN PO Take 1 tablet by  mouth daily.   HYDROcodone-acetaminophen 5-325 MG tablet Commonly known as:  NORCO/VICODIN Take 2 tablets by mouth every 6 (six) hours as needed for pain.   lisinopril 40 MG tablet Commonly known as:  PRINIVIL,ZESTRIL Take 20 mg by mouth daily.  loratadine 10 MG tablet Commonly known as:  CLARITIN Take 10 mg by mouth daily.   Melatonin 3 MG Tabs Take 12 mg by mouth at bedtime.   montelukast 10 MG tablet Commonly known as:  SINGULAIR Take 10 mg by mouth at bedtime.   multivitamin with minerals Tabs tablet Take 1 tablet by mouth daily.   omeprazole 20 MG capsule Commonly known as:  PRILOSEC Take 40 mg by mouth daily before breakfast.   senna 8.6 MG Tabs tablet Commonly known as:  SENOKOT Take 1 tablet by mouth daily.   sertraline 100 MG tablet Commonly known as:  ZOLOFT Take 100 mg by mouth daily.   simvastatin 80 MG tablet Commonly known as:  ZOCOR Take 40 mg by mouth at bedtime.         Time coordinating discharge: Greater than 35 minutes.  Signed:  RAMA,CHRISTINA  Pager [phone] [facility] 02/29/2016, 4:49 PM

## 2016-02-29 NOTE — Progress Notes (Signed)
Nutrition Follow-up  DOCUMENTATION CODES:   Morbid obesity  INTERVENTION:  Continue regular diet.   Encourage adequate PO intake.   NUTRITION DIAGNOSIS:   Inadequate oral intake related to inability to eat as evidenced by NPO status; diet advanced; po 100%; improved  GOAL:   Patient will meet greater than or equal to 90% of their needs; met  MONITOR:   PO intake, Labs, Weight trends, Skin, I & O's  REASON FOR ASSESSMENT:   Consult Enteral/tube feeding initiation and management  ASSESSMENT:   65 year old female with a past medical history significant for COPD, transferred from [facility] on [facility]12 with acute respiratory failure with hypoxemia secondary to severe community-acquired pneumonia. Required intubation on admission.  Pt extubated 11/14. Pt is currently on a regular diet. Meal completion has been 100%. Pt was unavailable during time of visit. RD to continue to monitor. Labs and medications reviewed.   Diet Order:  Diet regular Room service appropriate? Yes; Fluid consistency: Thin  Skin:  Reviewed, no issues  Last BM:  11/15  Height:   Ht Readings from Last 1 Encounters:  02/24/16 4' 9.5" (1.461 m)    Weight:   Wt Readings from Last 1 Encounters:  02/28/16 203 lb 6.4 oz (92.3 kg)    Ideal Body Weight:  43.6 kg  BMI:  Body mass index is 43.25 kg/m.  Estimated Nutritional Needs:   Kcal:  1650-1850  Protein:  109 gm  Fluid:  >/= 1.5 L  EDUCATION NEEDS:   No education needs identified at this time  Corrin Parker, MS, RD, LDN Pager # [phone] After hours/ weekend pager # [phone]

## 2016-02-29 NOTE — Care Management Note (Signed)
Case Management Note  Patient Details  Name: Lynn Dixon MRN: [id number] Date of Birth: Dec 16, 1950  Subjective/Objective:       CAP, COPD             Action/Plan: Discharge Planning: AVS reviewed: NCM spoke to pt at bedside. Pt lives at home with husband. Has CPAP at home. No NCM needs identified.   PCP Selinda FlavinHOWARD, KEVIN MD  Expected Discharge Date:  02/29/2016             Expected Discharge Plan:  Home/Self Care  In-House Referral:  NA  Discharge planning Services  CM Consult  Post Acute Care Choice:  NA Choice offered to:  NA  DME Arranged:  N/A DME Agency:  NA  HH Arranged:  NA HH Agency:  NA  Status of Service:  Completed, signed off  If discussed at Long Length of Stay Meetings, dates discussed:    Additional Comments:  Lynn Dixon, Lynn Karnes Ellen, RN 02/29/2016, 5:15 PM

## 2016-04-16 ENCOUNTER — Other Ambulatory Visit: Payer: Self-pay | Admitting: *Deleted

## 2016-04-16 NOTE — Patient Outreach (Addendum)
[facility]) Care Management  04/16/2016  Genia Delancy L Lenk [birth year] [id number]  Subjective: Telephone call to patient's home number, message states invalid number, and unable to leave a message.  Objective: Per chart and [employer] i[employer]ative review, patient hospitalized 02/24/16 - 02/29/16 for Community acquired pneumonia of left lower lobe of lung and COPD exacerbation.     Patient  hospitalized  02/20/16 - 02/24/16 for pneumonia ([facility]   Patient also has a history of obstructive sleep apnea and hyperlipidemia.   Assessment: Received [employer] Transition of care referral on 03/04/16.   Transition of care follow up pending patient contact.  Plan: RNCM will call patient for 2nd telephone outreach attempt, transition of care follow up, within 10 business days of receiving valid phone number. RNCM will contact [employer] RNCM to verify patient's contact phone number and obtain additional contact numbers if available within 10 business days. RNCM will contact patient's primary MD for patient's  contact information if needed within 10 business days.    Rhylee Nunn H. Gardiner Barefootooper RN, BSN, CCM [facility] Care Management [facility] Telephonic CM Phone: [phone] Fax: [phone]

## 2016-04-17 ENCOUNTER — Other Ambulatory Visit: Payer: Self-pay | Admitting: *Deleted

## 2016-04-17 NOTE — Patient Outreach (Addendum)
[facility]) Care Management  04/17/2016  Lynn Dixon [birth year] [id number]   Subjective: RNCM contacted Lynn Dixon [employer] RNCM via secure email and requested phone number verification.  Received secure email from Lynn Dixon at [employer], states she has [phone] as an additional contact number for this patient.    Objective: Per chart and [employer] iCollaborative review, patient hospitalized 02/24/16 - 02/29/16 for Community acquired pneumonia of left lower lobe of lung and COPD exacerbation.     Patient  hospitalized  02/20/16 - 02/24/16 for pneumonia ([facility]   Patient also has a history of obstructive sleep apnea and hyperlipidemia.   Assessment: Received [employer] Transition of care referral on 03/04/16.   Transition of care follow up pending patient contact.     Plan: RNCM will call patient for 2nd telephone outreach attempt, transition of care follow up, within 10 business days of receiving valid phone number. RNCM will contact [facility]'s [facility] MD to  request phone number verification, obtain additional contact numbers if available, and patient not reach via additional contact number provided by [employer], within 10 business days.       Lynn Meinders H. Gardiner Barefootooper RN, BSN, CCM [facility] Care Management [facility] Telephonic CM Phone: [phone] Fax: [phone]

## 2016-04-21 ENCOUNTER — Other Ambulatory Visit: Payer: Self-pay | Admitting: *Deleted

## 2016-04-21 ENCOUNTER — Encounter: Payer: Self-pay | Admitting: *Deleted

## 2016-04-21 NOTE — Patient Outreach (Addendum)
[facility]) Care Management  04/21/2016  Lynn Dixon 1951/03/11 [id number]   Subjective:Case discussed on 04/18/16, with Nicolasa DuckingSally Holland [facility] Care [facility] Assistant Manager, account researched, and states [employer] has been notified of patient's recent admission to [facility] facility ( 02/24/16 - 02/29/16) and authorization pending.  Case discussed with Robyne AskewKim Lampel [employer] CM verified [facility], no additional follow up needed from this RNCM.    Kim states this RNCM can close case and no need to outreach to patient at additional contact number.    Objective: Per chart and [employer] iCollaborative review, patient hospitalized 02/24/16 - 02/29/16 for Community acquired pneumonia of left lower lobe of lung and COPD exacerbation. Patient hospitalized 02/20/16 - 02/24/16 for pneumonia ([facility] Patient also has a history of obstructive sleep apnea and hyperlipidemia.   Assessment: Received [employer] Transition of care referral on 03/04/16. Transition of care follow up pending patient contact.  Plan: RNCM will send patient unsuccessful outreach letter, [facility] pamphlet, and proceed with case closure,  within 10 business days, if no return call from patient.     Kannon Baum H. Gardiner Barefootooper RN, BSN, CCM [facility] Care [facility] Telephonic CM Phone: [phone] Fax: [phone]

## 2016-05-05 ENCOUNTER — Other Ambulatory Visit: Payer: Self-pay | Admitting: *Deleted

## 2016-05-05 NOTE — Patient Outreach (Addendum)
[facility] Care Management  05/05/2016  Lynn Dixon June 27, 1950 [id number]  No response from patient outreach attempts and will proceed with case closure.   Objective: Per chart and [employer] iCollaborative review, patient hospitalized 02/24/16 - 02/29/16 for Community acquired pneumonia of left lower lobe of lung and COPD exacerbation. Patient hospitalized 02/20/16 - 02/24/16 for pneumonia ([facility] Patient also has a history of obstructive sleep apnea and hyperlipidemia.   Assessment: Received [employer] Transition of care referral on 03/04/16. Transition of care follow up not completed due to patient being unable to contact and will proceed with case closure.   Plan: RNCM will send case closure due to unable to contact request to Iverson AlaminLaura Greeson at [facility].   Charles Niese H. Gardiner Barefootooper RN, BSN, CCM [facility] Telephonic CM Phone: [phone] Fax: [phone]

## 2016-06-17 ENCOUNTER — Other Ambulatory Visit: Payer: Self-pay | Admitting: Neurosurgery

## 2016-06-17 DIAGNOSIS — G959 Disease of spinal cord, unspecified: Secondary | ICD-10-CM

## 2016-06-22 ENCOUNTER — Other Ambulatory Visit: Payer: Managed Care, Other (non HMO)

## 2016-06-23 ENCOUNTER — Ambulatory Visit
Admission: RE | Admit: 2016-06-23 | Discharge: 2016-06-23 | Disposition: A | Discharge status: Home / Self Care | Payer: Medicare Other | Source: Ambulatory Visit | Attending: Neurosurgery | Admitting: Neurosurgery

## 2016-06-23 DIAGNOSIS — G959 Disease of spinal cord, unspecified: Secondary | ICD-10-CM

## 2016-06-25 ENCOUNTER — Other Ambulatory Visit: Payer: Self-pay | Admitting: Neurosurgery

## 2016-07-15 ENCOUNTER — Encounter (HOSPITAL_COMMUNITY)
Admission: RE | Admit: 2016-07-15 | Discharge: 2016-07-15 | Disposition: A | Discharge status: Home / Self Care | Payer: Managed Care, Other (non HMO) | Source: Ambulatory Visit | Attending: Neurosurgery | Admitting: Neurosurgery

## 2016-07-15 ENCOUNTER — Encounter (HOSPITAL_COMMUNITY): Payer: Self-pay

## 2016-07-15 DIAGNOSIS — M4802 Spinal stenosis, cervical region: Secondary | ICD-10-CM | POA: Diagnosis not present

## 2016-07-15 DIAGNOSIS — R001 Bradycardia, unspecified: Secondary | ICD-10-CM | POA: Insufficient documentation

## 2016-07-15 DIAGNOSIS — Z01818 Encounter for other preprocedural examination: Secondary | ICD-10-CM | POA: Diagnosis not present

## 2016-07-15 DIAGNOSIS — Z01810 Encounter for preprocedural cardiovascular examination: Secondary | ICD-10-CM | POA: Insufficient documentation

## 2016-07-15 HISTORY — DX: Personal history of other medical treatment: Z92.89

## 2016-07-15 HISTORY — DX: Personal history of colon polyps, unspecified: Z86.0100

## 2016-07-15 HISTORY — DX: Other chronic pain: G89.29

## 2016-07-15 HISTORY — DX: Constipation, unspecified: K59.00

## 2016-07-15 HISTORY — DX: Unspecified glaucoma: H40.9

## 2016-07-15 HISTORY — DX: Personal history of other diseases of the respiratory system: Z87.09

## 2016-07-15 HISTORY — DX: Personal history of colonic polyps: Z86.010

## 2016-07-15 HISTORY — DX: Allergy, unspecified, initial encounter: T78.40XA

## 2016-07-15 HISTORY — DX: Weakness: R53.1

## 2016-07-15 HISTORY — DX: Cervicalgia: M54.2

## 2016-07-15 LAB — CBC WITH DIFFERENTIAL/PLATELET
BASOS PCT: 0 %
Basophils Absolute: 0 10*3/uL (ref 0.0–0.1)
Eosinophils Absolute: 0.1 10*3/uL (ref 0.0–0.7)
Eosinophils Relative: 2 %
HEMATOCRIT: 36.5 % (ref 36.0–46.0)
Hemoglobin: 12 g/dL (ref 12.0–15.0)
Lymphocytes Relative: 22 %
Lymphs Abs: 1.4 10*3/uL (ref 0.7–4.0)
MCH: 28.7 pg (ref 26.0–34.0)
MCHC: 32.9 g/dL (ref 30.0–36.0)
MCV: 87.3 fL (ref 78.0–100.0)
MONO ABS: 0.4 10*3/uL (ref 0.1–1.0)
MONOS PCT: 5 %
NEUTROS ABS: 4.6 10*3/uL (ref 1.7–7.7)
Neutrophils Relative %: 71 %
Platelets: 145 10*3/uL — ABNORMAL LOW (ref 150–400)
RBC: 4.18 MIL/uL (ref 3.87–5.11)
RDW: 13.8 % (ref 11.5–15.5)
WBC: 6.5 10*3/uL (ref 4.0–10.5)

## 2016-07-15 LAB — BASIC METABOLIC PANEL
ANION GAP: 9 (ref 5–15)
BUN: 13 mg/dL (ref 6–20)
CALCIUM: 9.9 mg/dL (ref 8.9–10.3)
CO2: 24 mmol/L (ref 22–32)
CREATININE: 0.89 mg/dL (ref 0.44–1.00)
Chloride: 107 mmol/L (ref 101–111)
GFR calc Af Amer: >60 mL/min (ref >60)
GFR calc non Af Amer: >60 mL/min (ref >60)
GLUCOSE: 111 mg/dL — AB (ref 65–99)
Potassium: 4.3 mmol/L (ref 3.5–5.1)
SODIUM: 140 mmol/L (ref 135–145)

## 2016-07-15 LAB — SURGICAL PCR SCREEN
MRSA, PCR: NEGATIVE
STAPHYLOCOCCUS AUREUS: NEGATIVE

## 2016-07-15 MED ORDER — CHLORHEXIDINE GLUCONATE CLOTH 2 % EX PADS: 6.0000 | MEDICATED_PAD | Freq: Once | CUTANEOUS | Status: DC

## 2016-07-15 NOTE — Pre-Procedure Instructions (Signed)
Lynn Dixon  07/15/2016      [facility] - [place], [place] - [address] [place] [address] Phone: [phone] Fax: [phone]  [facility] - [place] - [place] - [address] [place] [address] Phone: [phone] Fax: [phone]    Your procedure is scheduled on Wed, April 11 @ 8:30 AM  Report to [facility] Admitting at 6:30 AM  Call this number if you have problems the morning of surgery:  [phone]   Remember:  Do not eat food or drink liquids after midnight.  Take these medicines the morning of surgery with A SIP OF WATER Albuterol<Bring Your Inhaler With You>,Symbicort<Bring Inhaler With You>,Zyrtec(Cetirizine),Gabapentin(Neurontin),Omeprazole(Prilosec), and Zoloft(Sertraline)              Stop taking your Fish Oil,Naproxen,Aspirin,and Multivitamin along with any other Vitamins or Herbal Medications. No Goody's,BC's,Advil,Motrin,or Ibuprofen.    Do not wear jewelry, make-up or nail polish.  Do not wear lotions, powders,perfumes, or deoderant.  Do not shave 48 hours prior to surgery.    Do not bring valuables to the hospital.  [facility] is not responsible for any belongings or valuables.  Contacts, dentures or bridgework may not be worn into surgery.  Leave your suitcase in the car.  After surgery it may be brought to your room.  For patients admitted to the hospital, discharge time will be determined by your treatment team.  Patients discharged the day of surgery will not be allowed to drive home.    [facility]  Before surgery, you can play an important role.  Because skin is not sterile, your skin needs to be as free of germs as possible.  You can reduce the number of germs on you skin by washing with CHG (chlorahexidine gluconate) soap before surgery.  CHG is an antiseptic cleaner which kills germs and bonds with the skin to continue killing germs even after  washing.  Please DO NOT use if you have an allergy to CHG or antibacterial soaps.  If your skin becomes reddened/irritated stop using the CHG and inform your nurse when you arrive at Short Stay.  Do not shave (including legs and underarms) for at least 48 hours prior to the first CHG shower.  You may shave your face.  Please follow these instructions carefully:   1.  Shower with CHG Soap the night before surgery and the                                morning of Surgery.  2.  If you choose to wash your hair, wash your hair first as usual with your       normal shampoo.  3.  After you shampoo, rinse your hair and body thoroughly to remove the                      Shampoo.  4.  Use CHG as you would any other liquid soap.  You can apply chg directly       to the skin and wash gently with scrungie or a clean washcloth.  5.  Apply the CHG Soap to your body ONLY FROM THE NECK DOWN.        Do not use on open wounds or open sores.  Avoid contact with your eyes,  ears, mouth and genitals (private parts).  Wash genitals (private parts)       with your normal soap.  6.  Wash thoroughly, paying special attention to the area where your surgery        will be performed.  7.  Thoroughly rinse your body with warm water from the neck down.  8.  DO NOT shower/wash with your normal soap after using and rinsing off       the CHG Soap.  9.  Pat yourself dry with a clean towel.            10.  Wear clean pajamas.            11.  Place clean sheets on your bed the night of your first shower and do not        sleep with pets.  Day of Surgery  Do not apply any lotions/deoderants the morning of surgery.  Please wear clean clothes to the hospital/surgery center.    Please read over the following fact sheets that you were given. Pain Booklet, Coughing and Deep Breathing, MRSA Information and Surgical Site Infection Prevention

## 2016-07-15 NOTE — Progress Notes (Addendum)
Saw cardiologist Dr.Clevenger in 2016,went d/t chest pains. Was told everything was fine and no further follow up needed  Medical Md is Dr.Kevin Dimas Aguas  Echo reports in epic from 2016/2017  Stress test > 7 yrs ago=attempted but couldn't do. Stopped after 2 mins  Heart cath 10+ yrs ago at [facility]  EKG denies in past yr

## 2016-07-22 ENCOUNTER — Encounter (HOSPITAL_COMMUNITY): Payer: Self-pay | Admitting: Anesthesiology

## 2016-07-22 MED ORDER — DEXAMETHASONE SODIUM PHOSPHATE 10 MG/ML IJ SOLN
10.0000 mg | INTRAMUSCULAR | Status: AC
  Administered 2016-07-23: 10 mg via INTRAVENOUS
  Filled 2016-07-22: qty 1

## 2016-07-22 MED ORDER — VANCOMYCIN HCL IN DEXTROSE 1-5 GM/200ML-% IV SOLN
1000.0000 mg | INTRAVENOUS | Status: AC
  Administered 2016-07-23: 1000 mg via INTRAVENOUS
  Filled 2016-07-22: qty 200

## 2016-07-22 NOTE — Anesthesia Preprocedure Evaluation (Addendum)
Anesthesia Evaluation  Patient identified by MRN, date of birth, ID band Patient awake    Reviewed: Allergy & Precautions, NPO status , Patient's Chart, lab work & pertinent test results  Airway Mallampati: II  TM Distance: >3 FB Neck ROM: Limited    Dental  (+) Missing, Dental Advisory Given,    Pulmonary former smoker,    Pulmonary exam normal        Cardiovascular hypertension, Pt. on medications Normal cardiovascular exam     Neuro/Psych    GI/Hepatic   Endo/Other  Morbid obesity  Renal/GU      Musculoskeletal   Abdominal (+) + obese,   Peds  Hematology   Anesthesia Other Findings   Reproductive/Obstetrics                           Anesthesia Physical Anesthesia Plan  ASA: III  Anesthesia Plan: General   Post-op Pain Management:    Induction: Intravenous  Airway Management Planned: Oral ETT and Video Laryngoscope Planned  Additional Equipment: None  Intra-op Plan:   Post-operative Plan: Extubation in OR  Informed Consent: I have reviewed the patients History and Physical, chart, labs and discussed the procedure including the risks, benefits and alternatives for the proposed anesthesia with the patient or authorized representative who has indicated his/her understanding and acceptance.     Plan Discussed with: CRNA, Surgeon and Anesthesiologist  Anesthesia Plan Comments:       Anesthesia Quick Evaluation

## 2016-07-23 ENCOUNTER — Inpatient Hospital Stay (HOSPITAL_COMMUNITY)
Admission: RE | Admit: 2016-07-23 | Discharge: 2016-07-24 | DRG: 472 | Disposition: A | Discharge status: Home / Self Care | Payer: Managed Care, Other (non HMO) | Source: Ambulatory Visit | Attending: Neurosurgery | Admitting: Neurosurgery

## 2016-07-23 ENCOUNTER — Encounter (HOSPITAL_COMMUNITY): Payer: Self-pay | Admitting: *Deleted

## 2016-07-23 ENCOUNTER — Encounter (HOSPITAL_COMMUNITY)
Admission: RE | Disposition: A | Discharge status: Home / Self Care | Payer: Self-pay | Source: Ambulatory Visit | Attending: Neurosurgery

## 2016-07-23 ENCOUNTER — Inpatient Hospital Stay (HOSPITAL_COMMUNITY): Payer: Managed Care, Other (non HMO) | Admitting: Anesthesiology

## 2016-07-23 ENCOUNTER — Inpatient Hospital Stay (HOSPITAL_COMMUNITY): Payer: Managed Care, Other (non HMO)

## 2016-07-23 DIAGNOSIS — G9520 Unspecified cord compression: Secondary | ICD-10-CM | POA: Diagnosis present

## 2016-07-23 DIAGNOSIS — M50222 Other cervical disc displacement at C5-C6 level: Secondary | ICD-10-CM | POA: Diagnosis present

## 2016-07-23 DIAGNOSIS — M479 Spondylosis, unspecified: Secondary | ICD-10-CM | POA: Diagnosis present

## 2016-07-23 DIAGNOSIS — Z9049 Acquired absence of other specified parts of digestive tract: Secondary | ICD-10-CM

## 2016-07-23 DIAGNOSIS — G4700 Insomnia, unspecified: Secondary | ICD-10-CM | POA: Diagnosis present

## 2016-07-23 DIAGNOSIS — Z419 Encounter for procedure for purposes other than remedying health state, unspecified: Secondary | ICD-10-CM

## 2016-07-23 DIAGNOSIS — Z885 Allergy status to narcotic agent status: Secondary | ICD-10-CM | POA: Diagnosis not present

## 2016-07-23 DIAGNOSIS — Z96651 Presence of right artificial knee joint: Secondary | ICD-10-CM | POA: Diagnosis present

## 2016-07-23 DIAGNOSIS — G4730 Sleep apnea, unspecified: Secondary | ICD-10-CM | POA: Diagnosis present

## 2016-07-23 DIAGNOSIS — Z888 Allergy status to other drugs, medicaments and biological substances status: Secondary | ICD-10-CM

## 2016-07-23 DIAGNOSIS — Z79899 Other long term (current) drug therapy: Secondary | ICD-10-CM

## 2016-07-23 DIAGNOSIS — Z882 Allergy status to sulfonamides status: Secondary | ICD-10-CM | POA: Diagnosis not present

## 2016-07-23 DIAGNOSIS — M50221 Other cervical disc displacement at C4-C5 level: Secondary | ICD-10-CM | POA: Diagnosis present

## 2016-07-23 DIAGNOSIS — K219 Gastro-esophageal reflux disease without esophagitis: Secondary | ICD-10-CM | POA: Diagnosis present

## 2016-07-23 DIAGNOSIS — Z7982 Long term (current) use of aspirin: Secondary | ICD-10-CM | POA: Diagnosis not present

## 2016-07-23 DIAGNOSIS — Z9889 Other specified postprocedural states: Secondary | ICD-10-CM

## 2016-07-23 DIAGNOSIS — Z86010 Personal history of colonic polyps: Secondary | ICD-10-CM | POA: Diagnosis not present

## 2016-07-23 DIAGNOSIS — Z87891 Personal history of nicotine dependence: Secondary | ICD-10-CM

## 2016-07-23 DIAGNOSIS — I10 Essential (primary) hypertension: Secondary | ICD-10-CM | POA: Diagnosis present

## 2016-07-23 DIAGNOSIS — M4802 Spinal stenosis, cervical region: Secondary | ICD-10-CM | POA: Diagnosis present

## 2016-07-23 HISTORY — PX: ANTERIOR CERVICAL DECOMP/DISCECTOMY FUSION: SHX1161

## 2016-07-23 SURGERY — ANTERIOR CERVICAL DECOMPRESSION/DISCECTOMY FUSION 3 LEVELS
Anesthesia: General | Site: Spine Cervical

## 2016-07-23 MED ORDER — LIDOCAINE 2% (20 MG/ML) 5 ML SYRINGE
INTRAMUSCULAR | Status: DC | PRN
  Administered 2016-07-23: 100 mg via INTRAVENOUS

## 2016-07-23 MED ORDER — ONDANSETRON HCL 4 MG/2ML IJ SOLN: 4.0000 mg | Freq: Four times a day (QID) | INTRAMUSCULAR | Status: DC | PRN

## 2016-07-23 MED ORDER — PANTOPRAZOLE SODIUM 40 MG PO TBEC
40.0000 mg | DELAYED_RELEASE_TABLET | Freq: Every day | ORAL | Status: DC
  Administered 2016-07-24: 40 mg via ORAL
  Filled 2016-07-23: qty 1

## 2016-07-23 MED ORDER — HYDROCODONE-ACETAMINOPHEN 5-325 MG PO TABS
1.0000 | ORAL_TABLET | ORAL | Status: DC | PRN
  Administered 2016-07-23 – 2016-07-24 (×4): 2 via ORAL
  Filled 2016-07-23 (×5): qty 2

## 2016-07-23 MED ORDER — CYCLOBENZAPRINE HCL 10 MG PO TABS
10.0000 mg | ORAL_TABLET | Freq: Three times a day (TID) | ORAL | Status: DC | PRN
  Administered 2016-07-23 – 2016-07-24 (×2): 10 mg via ORAL
  Filled 2016-07-23 (×3): qty 1

## 2016-07-23 MED ORDER — ATORVASTATIN CALCIUM 20 MG PO TABS
40.0000 mg | ORAL_TABLET | Freq: Every day | ORAL | Status: DC
  Administered 2016-07-23: 40 mg via ORAL
  Filled 2016-07-23: qty 2

## 2016-07-23 MED ORDER — ONDANSETRON HCL 4 MG PO TABS: 4.0000 mg | ORAL_TABLET | Freq: Four times a day (QID) | ORAL | Status: DC | PRN

## 2016-07-23 MED ORDER — MEPERIDINE HCL 25 MG/ML IJ SOLN: 6.2500 mg | INTRAMUSCULAR | Status: DC | PRN

## 2016-07-23 MED ORDER — FUROSEMIDE 40 MG PO TABS
40.0000 mg | ORAL_TABLET | Freq: Every day | ORAL | Status: DC
  Administered 2016-07-24: 40 mg via ORAL
  Filled 2016-07-23: qty 1

## 2016-07-23 MED ORDER — LACTATED RINGERS IV SOLN
INTRAVENOUS | Status: DC | PRN
  Administered 2016-07-23: 08:00:00 via INTRAVENOUS

## 2016-07-23 MED ORDER — ZOLPIDEM TARTRATE 5 MG PO TABS
5.0000 mg | ORAL_TABLET | Freq: Every evening | ORAL | Status: DC | PRN
  Administered 2016-07-23: 5 mg via ORAL
  Filled 2016-07-23: qty 1

## 2016-07-23 MED ORDER — ROCURONIUM BROMIDE 50 MG/5ML IV SOSY
PREFILLED_SYRINGE | INTRAVENOUS | Status: AC
  Filled 2016-07-23: qty 5

## 2016-07-23 MED ORDER — ALBUTEROL SULFATE (2.5 MG/3ML) 0.083% IN NEBU: 2.5000 mg | INHALATION_SOLUTION | Freq: Four times a day (QID) | RESPIRATORY_TRACT | Status: DC | PRN

## 2016-07-23 MED ORDER — PROPOFOL 10 MG/ML IV BOLUS
INTRAVENOUS | Status: AC
  Filled 2016-07-23: qty 20

## 2016-07-23 MED ORDER — LIDOCAINE 2% (20 MG/ML) 5 ML SYRINGE
INTRAMUSCULAR | Status: AC
  Filled 2016-07-23: qty 5

## 2016-07-23 MED ORDER — MOMETASONE FURO-FORMOTEROL FUM 200-5 MCG/ACT IN AERO
2.0000 | INHALATION_SPRAY | Freq: Two times a day (BID) | RESPIRATORY_TRACT | Status: DC
  Administered 2016-07-23: 2 via RESPIRATORY_TRACT
  Filled 2016-07-23: qty 8.8

## 2016-07-23 MED ORDER — SODIUM CHLORIDE 0.9 % IV SOLN: 250.0000 mL | INTRAVENOUS | Status: DC

## 2016-07-23 MED ORDER — HYDROMORPHONE HCL 1 MG/ML IJ SOLN
0.5000 mg | INTRAMUSCULAR | Status: DC | PRN
  Administered 2016-07-23: 1 mg via INTRAVENOUS
  Filled 2016-07-23: qty 1

## 2016-07-23 MED ORDER — PROMETHAZINE HCL 25 MG/ML IJ SOLN: 6.2500 mg | INTRAMUSCULAR | Status: DC | PRN

## 2016-07-23 MED ORDER — MIDAZOLAM HCL 5 MG/5ML IJ SOLN
INTRAMUSCULAR | Status: DC | PRN
  Administered 2016-07-23: 2 mg via INTRAVENOUS

## 2016-07-23 MED ORDER — ALBUTEROL SULFATE HFA 108 (90 BASE) MCG/ACT IN AERS: 2.0000 | INHALATION_SPRAY | Freq: Four times a day (QID) | RESPIRATORY_TRACT | Status: DC | PRN

## 2016-07-23 MED ORDER — PHENOL 1.4 % MT LIQD: 1.0000 | OROMUCOSAL | Status: DC | PRN

## 2016-07-23 MED ORDER — SODIUM CHLORIDE 0.9% FLUSH: 3.0000 mL | INTRAVENOUS | Status: DC | PRN

## 2016-07-23 MED ORDER — ROCURONIUM BROMIDE 10 MG/ML (PF) SYRINGE
PREFILLED_SYRINGE | INTRAVENOUS | Status: DC | PRN
  Administered 2016-07-23: 40 mg via INTRAVENOUS

## 2016-07-23 MED ORDER — EPHEDRINE SULFATE-NACL 50-0.9 MG/10ML-% IV SOSY
PREFILLED_SYRINGE | INTRAVENOUS | Status: DC | PRN
  Administered 2016-07-23: 10 mg via INTRAVENOUS

## 2016-07-23 MED ORDER — VANCOMYCIN HCL IN DEXTROSE 1-5 GM/200ML-% IV SOLN
1000.0000 mg | Freq: Once | INTRAVENOUS | Status: AC
  Administered 2016-07-23: 1000 mg via INTRAVENOUS
  Filled 2016-07-23: qty 200

## 2016-07-23 MED ORDER — THROMBIN 20000 UNITS EX SOLR
CUTANEOUS | Status: AC
  Filled 2016-07-23: qty 20000

## 2016-07-23 MED ORDER — MIDAZOLAM HCL 2 MG/2ML IJ SOLN
INTRAMUSCULAR | Status: AC
  Filled 2016-07-23: qty 2

## 2016-07-23 MED ORDER — LISINOPRIL 20 MG PO TABS
20.0000 mg | ORAL_TABLET | Freq: Every day | ORAL | Status: DC
Start: 2016-07-24 — End: 2016-07-24
  Administered 2016-07-24: 20 mg via ORAL
  Filled 2016-07-23: qty 1

## 2016-07-23 MED ORDER — SUFENTANIL CITRATE 50 MCG/ML IV SOLN
INTRAVENOUS | Status: DC | PRN
  Administered 2016-07-23: 15 ug via INTRAVENOUS
  Administered 2016-07-23: 10 ug via INTRAVENOUS
  Administered 2016-07-23: 5 ug via INTRAVENOUS

## 2016-07-23 MED ORDER — SODIUM CHLORIDE 0.9 % IJ SOLN
INTRAMUSCULAR | Status: AC
  Filled 2016-07-23: qty 10

## 2016-07-23 MED ORDER — ONDANSETRON HCL 4 MG/2ML IJ SOLN
INTRAMUSCULAR | Status: AC
  Filled 2016-07-23: qty 2

## 2016-07-23 MED ORDER — GELATIN ABSORBABLE 100 EX MISC
CUTANEOUS | Status: DC | PRN
  Administered 2016-07-23: 09:00:00 via TOPICAL

## 2016-07-23 MED ORDER — SERTRALINE HCL 50 MG PO TABS
100.0000 mg | ORAL_TABLET | Freq: Two times a day (BID) | ORAL | Status: DC
  Administered 2016-07-23 – 2016-07-24 (×2): 100 mg via ORAL
  Filled 2016-07-23 (×2): qty 2

## 2016-07-23 MED ORDER — PROPOFOL 10 MG/ML IV BOLUS
INTRAVENOUS | Status: DC | PRN
  Administered 2016-07-23: 200 mg via INTRAVENOUS

## 2016-07-23 MED ORDER — SENNA 8.6 MG PO TABS
1.0000 | ORAL_TABLET | Freq: Every day | ORAL | Status: DC
  Administered 2016-07-23 – 2016-07-24 (×2): 8.6 mg via ORAL
  Filled 2016-07-23 (×2): qty 1

## 2016-07-23 MED ORDER — FENTANYL CITRATE (PF) 100 MCG/2ML IJ SOLN: 25.0000 ug | INTRAMUSCULAR | Status: DC | PRN

## 2016-07-23 MED ORDER — VITAMIN E 45 MG (100 UNIT) PO CAPS
1000.0000 [IU] | ORAL_CAPSULE | Freq: Every day | ORAL | Status: DC
  Administered 2016-07-24: 1000 [IU] via ORAL
  Filled 2016-07-23: qty 2

## 2016-07-23 MED ORDER — 0.9 % SODIUM CHLORIDE (POUR BTL) OPTIME
TOPICAL | Status: DC | PRN
  Administered 2016-07-23: 1000 mL

## 2016-07-23 MED ORDER — ONDANSETRON HCL 4 MG/2ML IJ SOLN
INTRAMUSCULAR | Status: DC | PRN
  Administered 2016-07-23: 4 mg via INTRAVENOUS

## 2016-07-23 MED ORDER — KETOROLAC TROMETHAMINE 30 MG/ML IJ SOLN: 30.0000 mg | Freq: Once | INTRAMUSCULAR | Status: DC | PRN

## 2016-07-23 MED ORDER — VITAMIN D 1000 UNITS PO TABS
1000.0000 [IU] | ORAL_TABLET | Freq: Every day | ORAL | Status: DC
  Administered 2016-07-24: 1000 [IU] via ORAL
  Filled 2016-07-23: qty 1

## 2016-07-23 MED ORDER — SUFENTANIL CITRATE 50 MCG/ML IV SOLN
INTRAVENOUS | Status: AC
Start: 2016-07-23 — End: 2016-07-23
  Filled 2016-07-23: qty 1

## 2016-07-23 MED ORDER — MENTHOL 3 MG MT LOZG
1.0000 | LOZENGE | OROMUCOSAL | Status: DC | PRN
  Filled 2016-07-23: qty 9

## 2016-07-23 MED ORDER — MONTELUKAST SODIUM 10 MG PO TABS
10.0000 mg | ORAL_TABLET | Freq: Every day | ORAL | Status: DC
  Administered 2016-07-24: 10 mg via ORAL
  Filled 2016-07-23: qty 1

## 2016-07-23 MED ORDER — ADULT MULTIVITAMIN W/MINERALS CH
1.0000 | ORAL_TABLET | Freq: Every day | ORAL | Status: DC
  Administered 2016-07-24: 1 via ORAL
  Filled 2016-07-23: qty 1

## 2016-07-23 MED ORDER — GABAPENTIN 300 MG PO CAPS
600.0000 mg | ORAL_CAPSULE | Freq: Two times a day (BID) | ORAL | Status: DC
  Administered 2016-07-23 – 2016-07-24 (×2): 600 mg via ORAL
  Filled 2016-07-23 (×2): qty 2

## 2016-07-23 MED ORDER — SODIUM CHLORIDE 0.9% FLUSH
3.0000 mL | Freq: Two times a day (BID) | INTRAVENOUS | Status: DC
  Administered 2016-07-23: 3 mL via INTRAVENOUS

## 2016-07-23 MED ORDER — SODIUM CHLORIDE 0.9 % IR SOLN
Status: DC | PRN
  Administered 2016-07-23: 09:00:00

## 2016-07-23 MED ORDER — LORATADINE 10 MG PO TABS
10.0000 mg | ORAL_TABLET | Freq: Every day | ORAL | Status: DC
  Administered 2016-07-24: 10 mg via ORAL
  Filled 2016-07-23: qty 1

## 2016-07-23 SURGICAL SUPPLY — 63 items
APL SKNCLS STERI-STRIP NONHPOA (GAUZE/BANDAGES/DRESSINGS) ×1
BAG DECANTER FOR FLEXI CONT (MISCELLANEOUS) ×3 IMPLANT
BENZOIN TINCTURE PRP APPL 2/3 (GAUZE/BANDAGES/DRESSINGS) ×3 IMPLANT
BIT DRILL 13 (BIT) ×1 IMPLANT
BIT DRILL 13MM (BIT) ×1
BUR MATCHSTICK NEURO 3.0 LAGG (BURR) ×3 IMPLANT
CAGE PEEK 6X14X11 (Cage) ×3 IMPLANT
CAGE PEEK 7X14X11 (Cage) ×3 IMPLANT
CAGE SPNL 11X14X6XRADOPQ (Cage) IMPLANT
CANISTER SUCT 3000ML PPV (MISCELLANEOUS) ×3 IMPLANT
CARTRIDGE OIL MAESTRO DRILL (MISCELLANEOUS) ×1 IMPLANT
CLOSURE WOUND 1/2 X4 (GAUZE/BANDAGES/DRESSINGS) ×1
DIFFUSER DRILL AIR PNEUMATIC (MISCELLANEOUS) ×3 IMPLANT
DRAPE C-ARM 42X72 X-RAY (DRAPES) ×6 IMPLANT
DRAPE LAPAROTOMY 100X72 PEDS (DRAPES) ×3 IMPLANT
DRAPE MICROSCOPE LEICA (MISCELLANEOUS) ×3 IMPLANT
DRAPE POUCH INSTRU U-SHP 10X18 (DRAPES) ×3 IMPLANT
DRSG OPSITE POSTOP 3X4 (GAUZE/BANDAGES/DRESSINGS) ×2 IMPLANT
DURAPREP 6ML APPLICATOR 50/CS (WOUND CARE) ×3 IMPLANT
ELECT COATED BLADE 2.86 ST (ELECTRODE) ×3 IMPLANT
ELECT REM PT RETURN 9FT ADLT (ELECTROSURGICAL) ×3
ELECTRODE REM PT RTRN 9FT ADLT (ELECTROSURGICAL) ×1 IMPLANT
GAUZE SPONGE 4X4 12PLY STRL (GAUZE/BANDAGES/DRESSINGS) ×3 IMPLANT
GAUZE SPONGE 4X4 12PLY STRL LF (GAUZE/BANDAGES/DRESSINGS) ×2 IMPLANT
GAUZE SPONGE 4X4 16PLY XRAY LF (GAUZE/BANDAGES/DRESSINGS) IMPLANT
GLOVE BIO SURGEON STRL SZ8 (GLOVE) ×2 IMPLANT
GLOVE BIOGEL PI IND STRL 7.0 (GLOVE) IMPLANT
GLOVE BIOGEL PI IND STRL 7.5 (GLOVE) IMPLANT
GLOVE BIOGEL PI INDICATOR 7.0 (GLOVE) ×4
GLOVE BIOGEL PI INDICATOR 7.5 (GLOVE) ×2
GLOVE ECLIPSE 9.0 STRL (GLOVE) ×3 IMPLANT
GLOVE SURG SS PI 6.5 STRL IVOR (GLOVE) ×6 IMPLANT
GOWN STRL REUS W/ TWL LRG LVL3 (GOWN DISPOSABLE) IMPLANT
GOWN STRL REUS W/ TWL XL LVL3 (GOWN DISPOSABLE) IMPLANT
GOWN STRL REUS W/TWL 2XL LVL3 (GOWN DISPOSABLE) IMPLANT
GOWN STRL REUS W/TWL LRG LVL3 (GOWN DISPOSABLE)
GOWN STRL REUS W/TWL XL LVL3 (GOWN DISPOSABLE) ×9
HALTER HD/CHIN CERV TRACTION D (MISCELLANEOUS) ×3 IMPLANT
KIT BASIN OR (CUSTOM PROCEDURE TRAY) ×3 IMPLANT
KIT ROOM TURNOVER OR (KITS) ×3 IMPLANT
NDL SPNL 20GX3.5 QUINCKE YW (NEEDLE) ×1 IMPLANT
NEEDLE SPNL 20GX3.5 QUINCKE YW (NEEDLE) ×3 IMPLANT
NS IRRIG 1000ML POUR BTL (IV SOLUTION) ×3 IMPLANT
OIL CARTRIDGE MAESTRO DRILL (MISCELLANEOUS) ×3
PACK LAMINECTOMY NEURO (CUSTOM PROCEDURE TRAY) ×3 IMPLANT
PAD ARMBOARD 7.5X6 YLW CONV (MISCELLANEOUS) ×9 IMPLANT
PEEK CAGE 7X14X11 (Cage) ×2 IMPLANT
PLATE 3 60XNS SPNE CVD ANT T (Plate) IMPLANT
PLATE 3 ATLANTIS TRANS (Plate) ×3 IMPLANT
RUBBERBAND STERILE (MISCELLANEOUS) ×6 IMPLANT
SCREW ST FIX 4 ATL 3120213 (Screw) ×16 IMPLANT
SPACER SPNL 11X14X7XPEEK CVD (Cage) IMPLANT
SPCR SPNL 11X14X7XPEEK CVD (Cage) ×1 IMPLANT
SPONGE INTESTINAL PEANUT (DISPOSABLE) ×3 IMPLANT
SPONGE SURGIFOAM ABS GEL 100 (HEMOSTASIS) ×3 IMPLANT
STRIP CLOSURE SKIN 1/2X4 (GAUZE/BANDAGES/DRESSINGS) ×2 IMPLANT
SUT VIC AB 3-0 SH 8-18 (SUTURE) ×3 IMPLANT
SUT VIC AB 4-0 RB1 18 (SUTURE) ×3 IMPLANT
TAPE CLOTH 4X10 WHT NS (GAUZE/BANDAGES/DRESSINGS) ×3 IMPLANT
TOWEL GREEN STERILE (TOWEL DISPOSABLE) ×2 IMPLANT
TOWEL GREEN STERILE FF (TOWEL DISPOSABLE) ×3 IMPLANT
TRAP SPECIMEN MUCOUS 40CC (MISCELLANEOUS) ×3 IMPLANT
WATER STERILE IRR 1000ML POUR (IV SOLUTION) ×3 IMPLANT

## 2016-07-23 NOTE — Progress Notes (Signed)
Pharmacy Antibiotic Note  Lynn Dixon is a 66 y.o. female admitted on 07/23/2016 for planned spinal surgery. Pharmacy has been consulted for post-op Vancomycin dosing. Per RN report - no drain is in place.   Vancomycin was given earlier today at 0830  Plan: 1. Vancomycin 1g IV x 1 dose at 2030 today 2. Pharmacy will sign off as no further doses are expected at this time     Temp (24hrs), Avg:98.8 F (37.1 C), Min:98 F (36.7 C), Max:99.1 F (37.3 C)  No results for input(s): WBC, CREATININE, LATICACIDVEN, VANCOTROUGH, VANCOPEAK, VANCORANDOM, GENTTROUGH, GENTPEAK, GENTRANDOM, TOBRATROUGH, TOBRAPEAK, TOBRARND, AMIKACINPEAK, AMIKACINTROU, AMIKACIN in the last 168 hours.  Estimated Creatinine Clearance: 59.1 mL/min (by C-G formula based on SCr of 0.89 mg/dL).    Allergies  Allergen Reactions  . Coconut Flavor Anaphylaxis    Anything with coconut in it  . Coconut Oil Anaphylaxis  . Cephalosporins Hives  . Sulfa Antibiotics Hives and Rash  . Cefuroxime Axetil Itching and Rash  . Estrogens Rash  . Morphine And Related Itching    Thank you for allowing pharmacy to be a part of this patient's care.  Rolley Sims 07/23/2016 12:54 PM

## 2016-07-23 NOTE — Op Note (Signed)
Date of procedure: 07/23/2016  Date of dictation: Same  Service: Neurosurgery  Preoperative diagnosis: Cervical stenosis with myelopathy  Postoperative diagnosis: Same  Procedure Name: C3-4, C4-5, C5-6 anterior cervical discectomy and interbody fusion utilizing interbody peek cage, locally harvested autograft, and anterior plate instrumentation  Surgeon:Nerine Pulse A.Tyjon Bowen, M.D.  Asst. Surgeon: Yetta Barre  Anesthesia: General  Indication: 66 year old female with bilateral upper extremity numbness, paresthesias and weakness. Workup demonstrates evidence of severe stenosis secondary to broad-based disc herniation and associated spondylosis at C3-4, C4-5 and C5-6. Patient presents now for anterior cervical decompressive surgery in hopes of improving her symptoms.  Operative note: Positioned supine with neck slightly extended and held in place with halter traction. Anterior cervical region prepped and draped sterilely. Incision made overlying C4-5. Dissection performed on the right. Retractor placed. Fluoroscopy used. Levels confirmed. Disc spaces at C3-4 C4-5 and C5-6 were incised with 15 blade and discectomies performed men's down to level posterior annulus. Microscope was then brought into the field used throughout the remainder of discectomy. Remaining aspects of annulus and osteophytes removed using a high-speed drill down to level the posterior wall she'll ligament. Posterior longitudinal ligament was elevated and resected piecemeal fashion. Underlying thecal sac was identified. Wide central decompression was then performed by undercutting the bodies of C3 and C4. Decompression then proceeded into each neural foramen. Wide anterior foraminotomies were performed on course exiting C4 nerve roots bilaterally. Procedures then repeated at C4-5 and C5-6 again without complications. Wounds and irrigated MI solution. [employer]onic anatomic peek cages were packed with locally harvested autograft. Cages were then packed  into place at all 3 levels. Each cage was recessed slightly from the anterior cortical margin of the vertebral bodies. Atlantis translational anterior cervical plate was then placed over the C3-C6 levels. This an attention fluoroscopic guidance using 13 mm fixed angle screws 2 each in all 4 levels. All 8 screws given a final tightening found to be solidly within the bone. Locking screws were engaged at all levels. Final images revealed good position of the cages and instrumentation at the proper upper level with normal alignment of the spine. Wounds and irrigated one final time. Hemostasis was assured with bipolar chart. Wounds and close in layers with Vicryl sutures. Steri-Strips and sterile dressing were applied. No apparent complications. Patient tolerated the procedure well and she returns to the recovery room postop.

## 2016-07-23 NOTE — Brief Op Note (Signed)
07/23/2016  10:45 AM  PATIENT:  Lynn Dixon  66 y.o. female  PRE-OPERATIVE DIAGNOSIS:  Stenosis  POST-OPERATIVE DIAGNOSIS:  Stenosis  PROCEDURE:  Procedure(s): CERVICAL THREE-FOUR, CERVICAL FOUR-FIVE, CERVICAL FIVE-SIX ANTERIOR CERVICAL DECOMPRESSION/DISCECTOMY FUSION (N/A)  SURGEON:  Surgeon(s) and Role:    * Julio Sicks, MD - Primary    * Tia Alert, MD - Assisting  PHYSICIAN ASSISTANT:   ASSISTANTS:    ANESTHESIA:   general  EBL:  No intake/output data recorded.  BLOOD ADMINISTERED:none  DRAINS: none   LOCAL MEDICATIONS USED:  NONE  SPECIMEN:  No Specimen  DISPOSITION OF SPECIMEN:  N/A  COUNTS:  YES  TOURNIQUET:  * No tourniquets in log *  DICTATION: .Dragon Dictation  PLAN OF CARE: Admit to inpatient   PATIENT DISPOSITION:  PACU - hemodynamically stable.   Delay start of Pharmacological VTE agent (>24hrs) due to surgical blood loss or risk of bleeding: yes

## 2016-07-23 NOTE — Progress Notes (Signed)
Orthopedic Tech Progress Note Patient Details:  Lynn Dixon July 29, 1950 [id number]  Ortho Devices Type of Ortho Device: Soft collar Ortho Device/Splint Location: Applied Cervical Soft Collar to pt Neck.  Pt tolerated well.   Ortho Device/Splint Interventions: Application, Adjustment   Alvina Chou 07/23/2016, 11:39 AM

## 2016-07-23 NOTE — Anesthesia Procedure Notes (Signed)
Procedure Name: Intubation Date/Time: 07/23/2016 8:44 AM Performed by: Melina Copa, [place] Leap R Pre-anesthesia Checklist: Patient identified, Emergency Drugs available, Suction available and Patient being monitored Patient Re-evaluated:Patient Re-evaluated prior to inductionOxygen Delivery Method: Circle System Utilized Preoxygenation: Pre-oxygenation with 100% oxygen Intubation Type: IV induction Ventilation: Mask ventilation without difficulty Laryngoscope Size: Mac and 3 Grade View: Grade III Tube type: Oral Tube size: 7.5 mm Number of attempts: 1 Airway Equipment and Method: Stylet Placement Confirmation: ETT inserted through vocal cords under direct vision,  positive ETCO2 and breath sounds checked- equal and bilateral Secured at: 20 cm Tube secured with: Tape Dental Injury: Teeth and Oropharynx as per pre-operative assessment

## 2016-07-23 NOTE — Anesthesia Postprocedure Evaluation (Addendum)
Anesthesia Post Note  Patient: Lynn Dixon  Procedure(s) Performed: Procedure(s) (LRB): CERVICAL THREE-FOUR, CERVICAL FOUR-FIVE, CERVICAL FIVE-SIX ANTERIOR CERVICAL DECOMPRESSION/DISCECTOMY FUSION (N/A)  Patient location during evaluation: PACU Anesthesia Type: General Level of consciousness: awake and sedated Pain management: pain level controlled Vital Signs Assessment: post-procedure vital signs reviewed and stable Respiratory status: spontaneous breathing Cardiovascular status: stable Postop Assessment: no signs of nausea or vomiting Anesthetic complications: no        Last Vitals:  Vitals:   07/23/16 1120 07/23/16 1130  BP:  (!) 157/87  Pulse: 92 88  Resp: 18 17  Temp:      Last Pain:  Vitals:   07/23/16 1130  TempSrc:   PainSc: 0-No pain   Pain Goal: Patients Stated Pain Goal: 4 (07/23/16 0705)               Shanieka Blea JR,JOHN Susann Givens

## 2016-07-23 NOTE — H&P (Signed)
Lynn Dixon is an 66 y.o. female.   Chief Complaint: Weakness HPI: 66 year old female with neck pain and bilateral upper extremity weakness and sensory loss. Workup demonstrates evidence of marked cervical stenosis with severe spinal cord compression. Patient presents now for three-level anterior cervical decompression and fusion.  Past Medical History:  Diagnosis Date  . Allergy    takes Zyrtec and Singulair daily  . Arthritis    BACK  . Chronic neck pain   . Constipation    takes Sennokot daily  . COPD (chronic obstructive pulmonary disease) (HCC)    uses Symbicort daily and Albuterol as needed  . Depression    PTSD-takes Zoloft daily  . GERD (gastroesophageal reflux disease)    takes Omeprazole daily  . Glaucoma    mild in left eye  . H/O hiatal hernia   . History of blood transfusion   . History of bronchitis   . History of colon polyps    benign  . Hyperlipidemia    takes Simvastatin daily  . Hypertension    takes Lisinopril daily  . Insomnia    takes Melatonin nightly  . Joint pain   . Joint swelling   . Pneumonia 2017  . Shortness of breath    with exertion occasionally  . Sleep apnea    USES CPAP  . Urinary urgency   . Weakness    numbness and tingling. Takes Gabapentin daily    Past Surgical History:  Procedure Laterality Date  . APPENDECTOMY    . BACK SURGERY     x 4  . BREAST REDUCTION SURGERY    . CARPEL TUNNEL Bilateral   . CHOLECYSTECTOMY    . COLONOSCOPY    . ESOPHAGOGASTRODUODENOSCOPY    . LUMBAR LAMINECTOMY/DECOMPRESSION MICRODISCECTOMY Left 12/03/2012   Procedure: Left Lumbar Four to Five Diskectomy;  Surgeon: Karn Cassis, MD;  Location: [facility];  Service: Neurosurgery;  Laterality: Left;  LUMBAR LAMINECTOMY/DECOMPRESSION MICRODISCECTOMY 1 LEVEL  . TOTAL KNEE ARTHROPLASTY Right     History reviewed. No pertinent family history. Social History:  reports that she has quit smoking. She has never used smokeless tobacco. She reports  that she does not drink alcohol or use drugs.  Allergies:  Allergies  Allergen Reactions  . Coconut Flavor Anaphylaxis    Anything with coconut in it  . Coconut Oil Anaphylaxis  . Cephalosporins Hives  . Sulfa Antibiotics Hives and Rash  . Cefuroxime Axetil Itching and Rash  . Estrogens Rash  . Morphine And Related Itching    Medications Prior to Admission  Medication Sig Dispense Refill  . albuterol (PROVENTIL HFA;VENTOLIN HFA) 108 (90 Base) MCG/ACT inhaler Inhale 2 puffs into the lungs every 6 (six) hours as needed for wheezing or shortness of breath. 1 Inhaler 2  . aspirin 81 MG chewable tablet Chew 81 mg by mouth daily.    . budesonide-formoterol (SYMBICORT) 160-4.5 MCG/ACT inhaler Inhale 2 puffs into the lungs 2 (two) times daily. 1 Inhaler 12  . cetirizine (ZYRTEC) 10 MG tablet Take 10 mg by mouth daily.    . cholecalciferol (VITAMIN D) 1000 units tablet Take 1,000 Units by mouth daily.    . eszopiclone (LUNESTA) 1 MG TABS tablet Take 0.5 mg by mouth at bedtime. Take immediately before bedtime    . furosemide (LASIX) 40 MG tablet Take 40 mg by mouth daily.    Marland Kitchen gabapentin (NEURONTIN) 300 MG capsule Take 600 mg by mouth 2 (two) times daily.     Marland Kitchen  GLUCOSAMINE-CHONDROITIN PO Take 1 tablet by mouth daily.    Marland Kitchen HYDROcodone-acetaminophen (NORCO/VICODIN) 5-325 MG per tablet Take 2 tablets by mouth every 6 (six) hours as needed for pain. (Patient taking differently: Take 2 tablets by mouth 2 (two) times daily as needed. ) 30 tablet 0  . lisinopril (PRINIVIL,ZESTRIL) 40 MG tablet Take 20-40 mg by mouth daily. Takes 0.5-1 tablet depending on blood pressure readings    . Melatonin 3 MG TABS Take 12 mg by mouth at bedtime.    . montelukast (SINGULAIR) 10 MG tablet Take 10 mg by mouth daily.     . Multiple Vitamin (MULTIVITAMIN WITH MINERALS) TABS tablet Take 1 tablet by mouth daily.    . naproxen sodium (ANAPROX) 220 MG tablet Take 220 mg by mouth daily as needed (pain).    . Omega-3 Fatty  Acids (FISH OIL) 1200 MG CAPS Take 1,200-2,400 mg by mouth 2 (two) times daily. 2 capsule in the morning and 1 capsule in the evening    . omeprazole (PRILOSEC) 20 MG capsule Take 40 mg by mouth daily before breakfast.     . senna (SENOKOT) 8.6 MG TABS tablet Take 1 tablet by mouth daily.    . sertraline (ZOLOFT) 100 MG tablet Take 100 mg by mouth 2 (two) times daily.     . simvastatin (ZOCOR) 80 MG tablet Take 40 mg by mouth at bedtime.    . vitamin E 1000 UNIT capsule Take 1,000 Units by mouth daily.      No results found for this or any previous visit (from the past 48 hour(s)). No results found.  Pertinent items noted in HPI and remainder of comprehensive ROS otherwise negative.  Blood pressure (!) 123/99, pulse 60, temperature 98 F (36.7 C), temperature source Oral, resp. rate 20, SpO2 98 %.  Patient is awake and alert. She is oriented and appropriate. Her cranial nerve function is intact. Her speech is fluent. Judgment and insight are intact. Motor examination reveals mild weakness distally in both upper extremities. Sensory examination reveals decreased sensation to pinprick and light touch in both distal upper extremities. Reflexes are increased. Hoffmann signs are present bilaterally. Gait is spastic. Posture is otherwise normal. Examination head ears eyes nose and throat is unremarkable. Neck is somewhat stiff. Airway midline. Carotid pulses normal. Examination of the chest and abdomen are benign. It extremities are free from injury deformity. Assessment/Plan Cervical stenosis with myelopathy, C3-4, C4-5, C5-6. Plan C3-4, C4-5, C5-6 anterior cervical discectomy with interbody fusion utilizing interbody peek cages, locally harvested autograft, and anterior plate instrumentation. Risks and benefits of been explained. Patient wishes to proceed.  Stefon Ramthun A 07/23/2016, 8:05 AM

## 2016-07-23 NOTE — Transfer of Care (Signed)
Immediate Anesthesia Transfer of Care Note  Patient: Genia Del  Procedure(s) Performed: Procedure(s): CERVICAL THREE-FOUR, CERVICAL FOUR-FIVE, CERVICAL FIVE-SIX ANTERIOR CERVICAL DECOMPRESSION/DISCECTOMY FUSION (N/A)  Patient Location: PACU  Anesthesia Type:General  Level of Consciousness: awake, oriented and patient cooperative  Airway & Oxygen Therapy: Patient Spontanous Breathing and Patient connected to nasal cannula oxygen  Post-op Assessment: Report given to RN, Post -op Vital signs reviewed and stable and Patient moving all extremities  Post vital signs: Reviewed and stable  Last Vitals:  Vitals:   07/23/16 0735 07/23/16 1057  BP: (!) 123/99   Pulse: 60   Resp: 20   Temp: 36.7 C 37.2 C    Last Pain:  Vitals:   07/23/16 0735  TempSrc: Oral  PainSc:       Patients Stated Pain Goal: 4 (07/23/16 0705)  Complications: No apparent anesthesia complications

## 2016-07-24 ENCOUNTER — Encounter (HOSPITAL_COMMUNITY): Payer: Self-pay | Admitting: Neurosurgery

## 2016-07-24 MED ORDER — CYCLOBENZAPRINE HCL 10 MG PO TABS: 10.0000 mg | ORAL_TABLET | Freq: Three times a day (TID) | ORAL | 0 refills | Status: DC | PRN

## 2016-07-24 MED ORDER — HYDROCODONE-ACETAMINOPHEN 5-325 MG PO TABS: 1.0000 | ORAL_TABLET | ORAL | 0 refills | Status: DC | PRN

## 2016-07-24 NOTE — Progress Notes (Signed)
Patient is discharged from room 3C07 at this time. Alert and in stable condition. IV site d/c'd and instructions read to patient with understanding verbalized. Left unit via wheelchair with husband and all belongings at side. 

## 2016-07-24 NOTE — Discharge Summary (Signed)
Physician Discharge Summary  Patient ID: Lynn Dixon MRN: [id number] DOB/AGE: 11-02-1950 66 y.o.  Admit date: 07/23/2016 Discharge date: 07/24/2016  Admission Diagnoses:  Discharge Diagnoses:  Active Problems:   Cervical stenosis of spinal canal   Discharged Condition: good  Hospital Course: Patient was admitted to the hospital where she underwent an uncomplicated three-level anterior cervical decompression infusion. Postoperative doing well. Preoperative neck and upper extremity pain numbness and weakness much improved. Swallowing well. Walking without difficulty. Ready for discharge home.  Consults:   Significant Diagnostic Studies:   Treatments:  Discharge Exam: Blood pressure 139/77, pulse 71, temperature 99.6 F (37.6 C), resp. rate 16, SpO2 92 %. Awake and alert. Oriented and appropriate. Cranial nerve function intact. Motor and sensory function of the extremities normal. Wound clean and dry. There is mild superficial bruising but no evidence of swelling. Airway midline. Chest and abdomen benign.  Disposition: 01-Home or Self Care   Allergies as of 07/24/2016      Reactions   Coconut Flavor Anaphylaxis   Anything with coconut in it   Coconut Oil Anaphylaxis   Cephalosporins Hives   Sulfa Antibiotics Hives, Rash   Cefuroxime Axetil Itching, Rash   Estrogens Rash   Morphine And Related Itching      Medication List    TAKE these medications   albuterol 108 (90 Base) MCG/ACT inhaler Commonly known as:  PROVENTIL HFA;VENTOLIN HFA Inhale 2 puffs into the lungs every 6 (six) hours as needed for wheezing or shortness of breath.   aspirin 81 MG chewable tablet Chew 81 mg by mouth daily.   budesonide-formoterol 160-4.5 MCG/ACT inhaler Commonly known as:  SYMBICORT Inhale 2 puffs into the lungs 2 (two) times daily.   cetirizine 10 MG tablet Commonly known as:  ZYRTEC Take 10 mg by mouth daily.   cholecalciferol 1000 units tablet Commonly known as:  VITAMIN  D Take 1,000 Units by mouth daily.   cyclobenzaprine 10 MG tablet Commonly known as:  FLEXERIL Take 1 tablet (10 mg total) by mouth 3 (three) times daily as needed for muscle spasms.   eszopiclone 1 MG Tabs tablet Commonly known as:  LUNESTA Take 0.5 mg by mouth at bedtime. Take immediately before bedtime   Fish Oil 1200 MG Caps Take 1,200-2,400 mg by mouth 2 (two) times daily. 2 capsule in the morning and 1 capsule in the evening   furosemide 40 MG tablet Commonly known as:  LASIX Take 40 mg by mouth daily.   gabapentin 300 MG capsule Commonly known as:  NEURONTIN Take 600 mg by mouth 2 (two) times daily.   GLUCOSAMINE-CHONDROITIN PO Take 1 tablet by mouth daily.   HYDROcodone-acetaminophen 5-325 MG tablet Commonly known as:  NORCO/VICODIN Take 1-2 tablets by mouth every 4 (four) hours as needed. What changed:  how much to take  when to take this  reasons to take this   lisinopril 40 MG tablet Commonly known as:  PRINIVIL,ZESTRIL Take 20-40 mg by mouth daily. Takes 0.5-1 tablet depending on blood pressure readings   Melatonin 3 MG Tabs Take 12 mg by mouth at bedtime.   montelukast 10 MG tablet Commonly known as:  SINGULAIR Take 10 mg by mouth daily.   multivitamin with minerals Tabs tablet Take 1 tablet by mouth daily.   naproxen sodium 220 MG tablet Commonly known as:  ANAPROX Take 220 mg by mouth daily as needed (pain).   omeprazole 20 MG capsule Commonly known as:  PRILOSEC Take 40 mg by mouth daily before  breakfast.   senna 8.6 MG Tabs tablet Commonly known as:  SENOKOT Take 1 tablet by mouth daily.   sertraline 100 MG tablet Commonly known as:  ZOLOFT Take 100 mg by mouth 2 (two) times daily.   simvastatin 80 MG tablet Commonly known as:  ZOCOR Take 40 mg by mouth at bedtime.   vitamin E 1000 UNIT capsule Take 1,000 Units by mouth daily.        Signed: Doaa Kendzierski A 07/24/2016, 9:15 AM

## 2016-07-24 NOTE — Discharge Instructions (Signed)

## 2016-09-19 NOTE — Addendum Note (Signed)
Addendum  created 09/19/16 1152 by Analeigh Aries, MD   Sign clinical note    

## 2017-02-05 ENCOUNTER — Ambulatory Visit: Payer: Self-pay | Admitting: Physician Assistant

## 2017-02-05 NOTE — H&P (View-Only) (Signed)
TOTAL KNEE ADMISSION H&P  Patient is being admitted for left total knee arthroplasty.  Subjective:  Chief Complaint:left knee pain.  HPI: Lynn Dixon, 66 y.o. female, has a history of pain and functional disability in the left knee due to arthritis and has failed non-surgical conservative treatments for greater than 12 weeks to includeNSAID's and/or analgesics, corticosteriod injections and activity modification.  Onset of symptoms was gradual, starting 6 years ago with stable course since that time. The patient noted no past surgery on the left knee(s).  Patient currently rates pain in the left knee(s) at 10 out of 10 with activity. Patient has night pain, worsening of pain with activity and weight bearing, pain that interferes with activities of daily living, pain with passive range of motion, crepitus and joint swelling.  Patient has evidence of periarticular osteophytes and joint space narrowing by imaging studies. There is no active infection.  Patient Active Problem List   Diagnosis Date Noted  . Cervical stenosis of spinal canal 07/23/2016  . COPD exacerbation (HCC)   . Centrilobular emphysema (HCC)   . OSA (obstructive sleep apnea)   . Other hyperlipidemia   . Endotracheally intubated   . Encounter for orogastric (OG) tube placement   . Community acquired pneumonia of left lower lobe of lung (HCC) 02/24/2016  . 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 UTI (urinary tract infection) 11/30/2012  . Acute lumbar back pain 11/28/2012  . Left leg weakness 11/28/2012  . HTN (hypertension), benign 11/28/2012  . Other and unspecified hyperlipidemia 11/28/2012  . COPD (chronic obstructive pulmonary disease) (HCC) 11/28/2012  . Dehydration 11/28/2012   Past Medical History:  Diagnosis Date  . Allergy    takes Zyrtec and Singulair daily  . Arthritis    BACK  . Chronic neck pain   . Constipation    takes Sennokot daily  . COPD (chronic obstructive pulmonary disease) (HCC)     uses Symbicort daily and Albuterol as needed  . Depression    PTSD-takes Zoloft daily  . GERD (gastroesophageal reflux disease)    takes Omeprazole daily  . Glaucoma    mild in left eye  . H/O hiatal hernia   . History of blood transfusion   . History of bronchitis   . History of colon polyps    benign  . Hyperlipidemia    takes Simvastatin daily  . Hypertension    takes Lisinopril daily  . Insomnia    takes Melatonin nightly  . Joint pain   . Joint swelling   . Pneumonia 2017  . Shortness of breath    with exertion occasionally  . Sleep apnea    USES CPAP  . Urinary urgency   . Weakness    numbness and tingling. Takes Gabapentin daily    Past Surgical History:  Procedure Laterality Date  . ANTERIOR CERVICAL DECOMP/DISCECTOMY FUSION N/A 07/23/2016   Procedure: CERVICAL THREE-FOUR, CERVICAL FOUR-FIVE, CERVICAL FIVE-SIX ANTERIOR CERVICAL DECOMPRESSION/DISCECTOMY FUSION;  Surgeon: Henry Pool, MD;  Location: [facility] OR;  Service: Neurosurgery;  Laterality: N/A;  . APPENDECTOMY    . BACK SURGERY     x 4  . BREAST REDUCTION SURGERY    . CARPEL TUNNEL Bilateral   . CHOLECYSTECTOMY    . COLONOSCOPY    . ESOPHAGOGASTRODUODENOSCOPY    . LUMBAR LAMINECTOMY/DECOMPRESSION MICRODISCECTOMY Left 12/03/2012   Procedure: Left Lumbar Four to Five Diskectomy;  Surgeon: Ernesto M Botero, MD;  Location: [facility];  Service: Neurosurgery;  Laterality: Left;  LUMBAR   LAMINECTOMY/DECOMPRESSION MICRODISCECTOMY 1 LEVEL  . TOTAL KNEE ARTHROPLASTY Right     Current Outpatient Prescriptions  Medication Sig Dispense Refill Last Dose  . albuterol (PROVENTIL HFA;VENTOLIN HFA) 108 (90 Base) MCG/ACT inhaler Inhale 2 puffs into the lungs every 6 (six) hours as needed for wheezing or shortness of breath. 1 Inhaler 2 07/23/2016 at 0500  . aspirin 81 MG chewable tablet Chew 81 mg by mouth daily.   Past Week at Unknown time  . budesonide-formoterol (SYMBICORT) 160-4.5 MCG/ACT inhaler Inhale 2 puffs into the  lungs 2 (two) times daily. 1 Inhaler 12 07/23/2016 at 0500  . cetirizine (ZYRTEC) 10 MG tablet Take 10 mg by mouth daily.   07/23/2016 at 0500  . cholecalciferol (VITAMIN D) 1000 units tablet Take 1,000 Units by mouth daily.   Past Week at Unknown time  . cyclobenzaprine (FLEXERIL) 10 MG tablet Take 1 tablet (10 mg total) by mouth 3 (three) times daily as needed for muscle spasms. 30 tablet 0   . eszopiclone (LUNESTA) 1 MG TABS tablet Take 0.5 mg by mouth at bedtime. Take immediately before bedtime   Past Week at Unknown time  . furosemide (LASIX) 40 MG tablet Take 40 mg by mouth daily.   Past Week at Unknown time  . gabapentin (NEURONTIN) 300 MG capsule Take 600 mg by mouth 2 (two) times daily.    07/23/2016 at 0500  . GLUCOSAMINE-CHONDROITIN PO Take 1 tablet by mouth daily.   11/27/2012 at Unknown  . HYDROcodone-acetaminophen (NORCO/VICODIN) 5-325 MG tablet Take 1-2 tablets by mouth every 4 (four) hours as needed. 60 tablet 0   . lisinopril (PRINIVIL,ZESTRIL) 40 MG tablet Take 20-40 mg by mouth daily. Takes 0.5-1 tablet depending on blood pressure readings   07/23/2016 at 0500  . Melatonin 3 MG TABS Take 12 mg by mouth at bedtime.   Past Week at Unknown time  . montelukast (SINGULAIR) 10 MG tablet Take 10 mg by mouth daily.    07/23/2016 at 0500  . Multiple Vitamin (MULTIVITAMIN WITH MINERALS) TABS tablet Take 1 tablet by mouth daily.   Past Week at Unknown time  . naproxen sodium (ANAPROX) 220 MG tablet Take 220 mg by mouth daily as needed (pain).   Past Week at Unknown time  . Omega-3 Fatty Acids (FISH OIL) 1200 MG CAPS Take 1,200-2,400 mg by mouth 2 (two) times daily. 2 capsule in the morning and 1 capsule in the evening   Past Week at Unknown time  . omeprazole (PRILOSEC) 20 MG capsule Take 40 mg by mouth daily before breakfast.    07/23/2016 at 0500  . senna (SENOKOT) 8.6 MG TABS tablet Take 1 tablet by mouth daily.   07/22/2016 at Unknown time  . sertraline (ZOLOFT) 100 MG tablet Take 100 mg by  mouth 2 (two) times daily.    07/23/2016 at 0500  . simvastatin (ZOCOR) 80 MG tablet Take 40 mg by mouth at bedtime.   07/22/2016 at Unknown time  . vitamin E 1000 UNIT capsule Take 1,000 Units by mouth daily.   Past Week at Unknown time   No current facility-administered medications for this visit.    Allergies  Allergen Reactions  . Coconut Flavor Anaphylaxis    Anything with coconut in it  . Coconut Oil Anaphylaxis  . Cephalosporins Hives  . Sulfa Antibiotics Hives and Rash  . Cefuroxime Axetil Itching and Rash  . Estrogens Rash  . Morphine And Related Itching    Social History  Substance Use Topics  .   Smoking status: Former Smoker  . Smokeless tobacco: Never Used     Comment: quit smoking 8 yrs ago  . Alcohol use No    No family history on file.   Review of Systems  Respiratory: Positive for shortness of breath.   Musculoskeletal: Positive for joint pain.  Skin: Positive for rash.  All other systems reviewed and are negative.   Objective:  Physical Exam  Constitutional: She is oriented to person, place, and time. She appears well-developed and well-nourished. No distress.  HENT:  Head: Normocephalic and atraumatic.  Nose: Nose normal.  Eyes: Pupils are equal, round, and reactive to light. Conjunctivae and EOM are normal.  Neck: Normal range of motion. Neck supple.  Cardiovascular: Normal rate, regular rhythm, normal heart sounds and intact distal pulses.   Respiratory: Effort normal and breath sounds normal. No respiratory distress. She has no wheezes.  GI: Soft. Bowel sounds are normal. She exhibits no distension. There is no tenderness.  Musculoskeletal:       Left knee: She exhibits decreased range of motion and swelling. Tenderness found.  Lymphadenopathy:    She has no cervical adenopathy.  Neurological: She is alert and oriented to person, place, and time.  Skin: Skin is warm and dry. No erythema.  Psychiatric: She has a normal mood and affect. Her behavior  is normal.    Vital signs in last 24 hours: @VSRANGES@  Labs:   Estimated body mass index is 43.46 kg/m as calculated from the following:   Height as of 07/15/16: 4' 9.5" (1.461 m).   Weight as of 07/15/16: 92.7 kg (204 lb 6 oz).   Imaging Review Plain radiographs demonstrate moderate degenerative joint disease of the left knee(s). The overall alignment issignificant varus. The bone quality appears to be good for age and reported activity level.  Assessment/Plan:  End stage arthritis, left knee   The patient history, physical examination, clinical judgment of the provider and imaging studies are consistent with end stage degenerative joint disease of the left knee(s) and total knee arthroplasty is deemed medically necessary. The treatment options including medical management, injection therapy arthroscopy and arthroplasty were discussed at length. The risks and benefits of total knee arthroplasty were presented and reviewed. The risks due to aseptic loosening, infection, stiffness, patella tracking problems, thromboembolic complications and other imponderables were discussed. The patient acknowledged the explanation, agreed to proceed with the plan and consent was signed. Patient is being admitted for inpatient treatment for surgery, pain control, PT, OT, prophylactic antibiotics, VTE prophylaxis, progressive ambulation and ADL's and discharge planning. The patient is planning to be discharged home with home health services  

## 2017-02-05 NOTE — H&P (Signed)
TOTAL KNEE ADMISSION H&P  Patient is being admitted for left total knee arthroplasty.  Subjective:  Chief Complaint:left knee pain.  HPI: Lynn Dixon, 66 y.o. female, has a history of pain and functional disability in the left knee due to arthritis and has failed non-surgical conservative treatments for greater than 12 weeks to includeNSAID's and/or analgesics, corticosteriod injections and activity modification.  Onset of symptoms was gradual, starting 6 years ago with stable course since that time. The patient noted no past surgery on the left knee(s).  Patient currently rates pain in the left knee(s) at 10 out of 10 with activity. Patient has night pain, worsening of pain with activity and weight bearing, pain that interferes with activities of daily living, pain with passive range of motion, crepitus and joint swelling.  Patient has evidence of periarticular osteophytes and joint space narrowing by imaging studies. There is no active infection.  Patient Active Problem List   Diagnosis Date Noted  . Cervical stenosis of spinal canal 07/23/2016  . COPD exacerbation (HCC)   . Centrilobular emphysema (HCC)   . OSA (obstructive sleep apnea)   . Other hyperlipidemia   . Endotracheally intubated   . Encounter for orogastric (OG) tube placement   . Community acquired pneumonia of left lower lobe of lung (HCC) 02/24/2016  . 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 UTI (urinary tract infection) 11/30/2012  . Acute lumbar back pain 11/28/2012  . Left leg weakness 11/28/2012  . HTN (hypertension), benign 11/28/2012  . Other and unspecified hyperlipidemia 11/28/2012  . COPD (chronic obstructive pulmonary disease) (HCC) 11/28/2012  . Dehydration 11/28/2012   Past Medical History:  Diagnosis Date  . Allergy    takes Zyrtec and Singulair daily  . Arthritis    BACK  . Chronic neck pain   . Constipation    takes Sennokot daily  . COPD (chronic obstructive pulmonary disease) (HCC)     uses Symbicort daily and Albuterol as needed  . Depression    PTSD-takes Zoloft daily  . GERD (gastroesophageal reflux disease)    takes Omeprazole daily  . Glaucoma    mild in left eye  . H/O hiatal hernia   . History of blood transfusion   . History of bronchitis   . History of colon polyps    benign  . Hyperlipidemia    takes Simvastatin daily  . Hypertension    takes Lisinopril daily  . Insomnia    takes Melatonin nightly  . Joint pain   . Joint swelling   . Pneumonia 2017  . Shortness of breath    with exertion occasionally  . Sleep apnea    USES CPAP  . Urinary urgency   . Weakness    numbness and tingling. Takes Gabapentin daily    Past Surgical History:  Procedure Laterality Date  . ANTERIOR CERVICAL DECOMP/DISCECTOMY FUSION N/A 07/23/2016   Procedure: CERVICAL THREE-FOUR, CERVICAL FOUR-FIVE, CERVICAL FIVE-SIX ANTERIOR CERVICAL DECOMPRESSION/DISCECTOMY FUSION;  Surgeon: Julio SicksHenry Pool, MD;  [facility];  Service: Neurosurgery;  Laterality: N/A;  . APPENDECTOMY    . BACK SURGERY     x 4  . BREAST REDUCTION SURGERY    . CARPEL TUNNEL Bilateral   . CHOLECYSTECTOMY    . COLONOSCOPY    . ESOPHAGOGASTRODUODENOSCOPY    . LUMBAR LAMINECTOMY/DECOMPRESSION MICRODISCECTOMY Left 12/03/2012   Procedure: Left Lumbar Four to Five Diskectomy;  Surgeon: Karn CassisErnesto M Botero, MD;  Location: [facility];  Service: Neurosurgery;  Laterality: Left;  LUMBAR  LAMINECTOMY/DECOMPRESSION MICRODISCECTOMY 1 LEVEL  . TOTAL KNEE ARTHROPLASTY Right     Current Outpatient Prescriptions  Medication Sig Dispense Refill Last Dose  . albuterol (PROVENTIL HFA;VENTOLIN HFA) 108 (90 Base) MCG/ACT inhaler Inhale 2 puffs into the lungs every 6 (six) hours as needed for wheezing or shortness of breath. 1 Inhaler 2 07/23/2016 at 0500  . aspirin 81 MG chewable tablet Chew 81 mg by mouth daily.   Past Week at Unknown time  . budesonide-formoterol (SYMBICORT) 160-4.5 MCG/ACT inhaler Inhale 2 puffs into the  lungs 2 (two) times daily. 1 Inhaler 12 07/23/2016 at 0500  . cetirizine (ZYRTEC) 10 MG tablet Take 10 mg by mouth daily.   07/23/2016 at 0500  . cholecalciferol (VITAMIN D) 1000 units tablet Take 1,000 Units by mouth daily.   Past Week at Unknown time  . cyclobenzaprine (FLEXERIL) 10 MG tablet Take 1 tablet (10 mg total) by mouth 3 (three) times daily as needed for muscle spasms. 30 tablet 0   . eszopiclone (LUNESTA) 1 MG TABS tablet Take 0.5 mg by mouth at bedtime. Take immediately before bedtime   Past Week at Unknown time  . furosemide (LASIX) 40 MG tablet Take 40 mg by mouth daily.   Past Week at Unknown time  . gabapentin (NEURONTIN) 300 MG capsule Take 600 mg by mouth 2 (two) times daily.    07/23/2016 at 0500  . GLUCOSAMINE-CHONDROITIN PO Take 1 tablet by mouth daily.   11/27/2012 at Unknown  . HYDROcodone-acetaminophen (NORCO/VICODIN) 5-325 MG tablet Take 1-2 tablets by mouth every 4 (four) hours as needed. 60 tablet 0   . lisinopril (PRINIVIL,ZESTRIL) 40 MG tablet Take 20-40 mg by mouth daily. Takes 0.5-1 tablet depending on blood pressure readings   07/23/2016 at 0500  . Melatonin 3 MG TABS Take 12 mg by mouth at bedtime.   Past Week at Unknown time  . montelukast (SINGULAIR) 10 MG tablet Take 10 mg by mouth daily.    07/23/2016 at 0500  . Multiple Vitamin (MULTIVITAMIN WITH MINERALS) TABS tablet Take 1 tablet by mouth daily.   Past Week at Unknown time  . naproxen sodium (ANAPROX) 220 MG tablet Take 220 mg by mouth daily as needed (pain).   Past Week at Unknown time  . Omega-3 Fatty Acids (FISH OIL) 1200 MG CAPS Take 1,200-2,400 mg by mouth 2 (two) times daily. 2 capsule in the morning and 1 capsule in the evening   Past Week at Unknown time  . omeprazole (PRILOSEC) 20 MG capsule Take 40 mg by mouth daily before breakfast.    07/23/2016 at 0500  . senna (SENOKOT) 8.6 MG TABS tablet Take 1 tablet by mouth daily.   07/22/2016 at Unknown time  . sertraline (ZOLOFT) 100 MG tablet Take 100 mg by  mouth 2 (two) times daily.    07/23/2016 at 0500  . simvastatin (ZOCOR) 80 MG tablet Take 40 mg by mouth at bedtime.   07/22/2016 at Unknown time  . vitamin E 1000 UNIT capsule Take 1,000 Units by mouth daily.   Past Week at Unknown time   No current facility-administered medications for this visit.    Allergies  Allergen Reactions  . Coconut Flavor Anaphylaxis    Anything with coconut in it  . Coconut Oil Anaphylaxis  . Cephalosporins Hives  . Sulfa Antibiotics Hives and Rash  . Cefuroxime Axetil Itching and Rash  . Estrogens Rash  . Morphine And Related Itching    Social History  Substance Use Topics  .  Smoking status: Former Games developer  . Smokeless tobacco: Never Used     Comment: quit smoking 8 yrs ago  . Alcohol use No    No family history on file.   Review of Systems  Respiratory: Positive for shortness of breath.   Musculoskeletal: Positive for joint pain.  Skin: Positive for rash.  All other systems reviewed and are negative.   Objective:  Physical Exam  Constitutional: She is oriented to person, place, and time. She appears well-developed and well-nourished. No distress.  HENT:  Head: Normocephalic and atraumatic.  Nose: Nose normal.  Eyes: Pupils are equal, round, and reactive to light. Conjunctivae and EOM are normal.  Neck: Normal range of motion. Neck supple.  Cardiovascular: Normal rate, regular rhythm, normal heart sounds and intact distal pulses.   Respiratory: Effort normal and breath sounds normal. No respiratory distress. She has no wheezes.  GI: Soft. Bowel sounds are normal. She exhibits no distension. There is no tenderness.  Musculoskeletal:       Left knee: She exhibits decreased range of motion and swelling. Tenderness found.  Lymphadenopathy:    She has no cervical adenopathy.  Neurological: She is alert and oriented to person, place, and time.  Skin: Skin is warm and dry. No erythema.  Psychiatric: She has a normal mood and affect. Her behavior  is normal.    Vital signs in last 24 hours: @VSRANGES @  Labs:   Estimated body mass index is 43.46 kg/m as calculated from the following:   Height as of 07/15/16: 4' 9.5" (1.461 m).   Weight as of 07/15/16: 92.7 kg (204 lb 6 oz).   Imaging Review Plain radiographs demonstrate moderate degenerative joint disease of the left knee(s). The overall alignment issignificant varus. The bone quality appears to be good for age and reported activity level.  Assessment/Plan:  End stage arthritis, left knee   The patient history, physical examination, clinical judgment of the provider and imaging studies are consistent with end stage degenerative joint disease of the left knee(s) and total knee arthroplasty is deemed medically necessary. The treatment options including medical management, injection therapy arthroscopy and arthroplasty were discussed at length. The risks and benefits of total knee arthroplasty were presented and reviewed. The risks due to aseptic loosening, infection, stiffness, patella tracking problems, thromboembolic complications and other imponderables were discussed. The patient acknowledged the explanation, agreed to proceed with the plan and consent was signed. Patient is being admitted for inpatient treatment for surgery, pain control, PT, OT, prophylactic antibiotics, VTE prophylaxis, progressive ambulation and ADL's and discharge planning. The patient is planning to be discharged home with home health services

## 2017-02-13 NOTE — Pre-Procedure Instructions (Signed)
SHAKERRA RED  02/13/2017      [facility] - [place] Al[place],  - [place], [place] - [address] [place] [address] Phone: [phone] Fax: [phone]  [facility] - [place], [place] - [place], [place] - [address] [place] [address] Phone: [phone] Fax: [phone]    Your procedure is scheduled on 02/27/2017.  Report to [facility] Admitting at 0530 A.M.  Call this number if you have problems the morning of surgery:  [phone]   Remember:  Do not eat food or drink liquids after midnight.  Continue all other medications as directed by your physician except follow these medication instructions before surgery   Take these medicines the morning of surgery with A SIP OF WATER: Albuterol inhaler - if needed Symbicort inhaler - if needed Cetirizine (Zyrtec) Gabapentin (Neurontin) Hydrocodone-acetaminophen (Norco) - if needed Montelukast (Singulair) Omeprazole (Prilosec) sertaline (zoloft)  7 days prior to surgery STOP taking any Aspirin (unless otherwise instructed by your surgeon), Aleve, Naproxen, Ibuprofen, Motrin, Advil, Goody's, BC's, all herbal medications, fish oil, and all vitamins   Follow your doctors instructions regarding your Aspirin.  If no instructions were given by the doctor you will need to call the office to get instructions.  Your pre admission RN will also call for those instructions    Do not wear jewelry, make-up or nail polish.  Do not wear lotions, powders, or perfumes, or deodorant.  Do not shave 48 hours prior to surgery.  Men may shave face and neck.  Do not bring valuables to the hospital.  [facility] is not responsible for any belongings or valuables.  Contacts, eyeglasses, dentures or bridgework may not be worn into surgery.  Leave your suitcase in the car.  After surgery it may be brought to your room.  For patients admitted to the hospital, discharge time will be determined by your  treatment team.  Patients discharged the day of surgery will not be allowed to drive home.   Name and phone number of your driver:    Special instructions:   [place]- Preparing For Surgery  Before surgery, you can play an important role. Because skin is not sterile, your skin needs to be as free of germs as possible. You can reduce the number of germs on your skin by washing with CHG (chlorahexidine gluconate) Soap before surgery.  CHG is an antiseptic cleaner which kills germs and bonds with the skin to continue killing germs even after washing.  Please do not use if you have an allergy to CHG or antibacterial soaps. If your skin becomes reddened/irritated stop using the CHG.  Do not shave (including legs and underarms) for at least 48 hours prior to first CHG shower. It is OK to shave your face.  Please follow these instructions carefully.   1. Shower the NIGHT BEFORE SURGERY and the MORNING OF SURGERY with CHG.   2. If you chose to wash your hair, wash your hair first as usual with your normal shampoo.  3. After you shampoo, rinse your hair and body thoroughly to remove the shampoo.  4. Use CHG as you would any other liquid soap. You can apply CHG directly to the skin and wash gently with a scrungie or a clean washcloth.   5. Apply the CHG Soap to your body ONLY FROM THE NECK DOWN.  Do not use on open wounds or open sores. Avoid contact with your eyes,  ears, mouth and genitals (private parts). Wash Face and genitals (private parts)  with your normal soap.  6. Wash thoroughly, paying special attention to the area where your surgery will be performed.  7. Thoroughly rinse your body with warm water from the neck down.  8. DO NOT shower/wash with your normal soap after using and rinsing off the CHG Soap.  9. Pat yourself dry with a CLEAN TOWEL.  10. Wear CLEAN PAJAMAS to bed the night before surgery, wear comfortable clothes the morning of surgery  11. Place CLEAN SHEETS on your  bed the night of your first shower and DO NOT SLEEP WITH PETS.    Day of Surgery: Do not apply any deodorants/lotions. Please wear clean clothes to the hospital/surgery center.      Please read over the following fact sheets that you were given. Pain Booklet, Coughing and Deep Breathing, Total Joint Packet, MRSA Information and Surgical Site Infection Prevention

## 2017-02-16 ENCOUNTER — Encounter (HOSPITAL_COMMUNITY): Payer: Self-pay

## 2017-02-16 ENCOUNTER — Other Ambulatory Visit: Payer: Self-pay

## 2017-02-16 ENCOUNTER — Ambulatory Visit (HOSPITAL_COMMUNITY)
Admission: RE | Admit: 2017-02-16 | Discharge: 2017-02-16 | Disposition: A | Discharge status: Home / Self Care | Payer: Managed Care, Other (non HMO) | Source: Ambulatory Visit | Attending: Physician Assistant | Admitting: Physician Assistant

## 2017-02-16 ENCOUNTER — Encounter (HOSPITAL_COMMUNITY)
Admission: RE | Admit: 2017-02-16 | Discharge: 2017-02-16 | Disposition: A | Discharge status: Home / Self Care | Payer: Managed Care, Other (non HMO) | Source: Ambulatory Visit | Attending: Orthopedic Surgery | Admitting: Orthopedic Surgery

## 2017-02-16 DIAGNOSIS — E119 Type 2 diabetes mellitus without complications: Secondary | ICD-10-CM | POA: Insufficient documentation

## 2017-02-16 DIAGNOSIS — Z01818 Encounter for other preprocedural examination: Secondary | ICD-10-CM | POA: Insufficient documentation

## 2017-02-16 DIAGNOSIS — M1712 Unilateral primary osteoarthritis, left knee: Secondary | ICD-10-CM | POA: Diagnosis not present

## 2017-02-16 HISTORY — DX: Type 2 diabetes mellitus without complications: E11.9

## 2017-02-16 LAB — COMPREHENSIVE METABOLIC PANEL
ALBUMIN: 4.1 g/dL (ref 3.5–5.0)
ALK PHOS: 60 U/L (ref 38–126)
ALT: 30 U/L (ref 14–54)
ANION GAP: 8 (ref 5–15)
AST: 33 U/L (ref 15–41)
BILIRUBIN TOTAL: 0.5 mg/dL (ref 0.3–1.2)
BUN: 10 mg/dL (ref 6–20)
CALCIUM: 9 mg/dL (ref 8.9–10.3)
CO2: 21 mmol/L — ABNORMAL LOW (ref 22–32)
Chloride: 111 mmol/L (ref 101–111)
Creatinine, Ser: 0.92 mg/dL (ref 0.44–1.00)
GFR calc non Af Amer: >60 mL/min (ref >60)
GLUCOSE: 136 mg/dL — AB (ref 65–99)
POTASSIUM: 3.7 mmol/L (ref 3.5–5.1)
SODIUM: 140 mmol/L (ref 135–145)
TOTAL PROTEIN: 7 g/dL (ref 6.5–8.1)

## 2017-02-16 LAB — CBC WITH DIFFERENTIAL/PLATELET
BASOS PCT: 0 %
Basophils Absolute: 0 10*3/uL (ref 0.0–0.1)
EOS ABS: 0.2 10*3/uL (ref 0.0–0.7)
Eosinophils Relative: 3 %
HCT: 35.3 % — ABNORMAL LOW (ref 36.0–46.0)
HEMOGLOBIN: 11.4 g/dL — AB (ref 12.0–15.0)
LYMPHS ABS: 1.1 10*3/uL (ref 0.7–4.0)
Lymphocytes Relative: 17 %
MCH: 28.5 pg (ref 26.0–34.0)
MCHC: 32.3 g/dL (ref 30.0–36.0)
MCV: 88.3 fL (ref 78.0–100.0)
MONO ABS: 0.3 10*3/uL (ref 0.1–1.0)
MONOS PCT: 4 %
Neutro Abs: 5.2 10*3/uL (ref 1.7–7.7)
Neutrophils Relative %: 76 %
Platelets: 116 10*3/uL — ABNORMAL LOW (ref 150–400)
RBC: 4 MIL/uL (ref 3.87–5.11)
RDW: 13.8 % (ref 11.5–15.5)
WBC: 6.8 10*3/uL (ref 4.0–10.5)

## 2017-02-16 LAB — URINALYSIS, ROUTINE W REFLEX MICROSCOPIC
BILIRUBIN URINE: NEGATIVE
Bacteria, UA: NONE SEEN
GLUCOSE, UA: NEGATIVE mg/dL
HGB URINE DIPSTICK: NEGATIVE
Ketones, ur: NEGATIVE mg/dL
Nitrite: NEGATIVE
PH: 5 (ref 5.0–8.0)
Protein, ur: NEGATIVE mg/dL
RBC / HPF: NONE SEEN RBC/hpf (ref 0–5)
SPECIFIC GRAVITY, URINE: 1.015 (ref 1.005–1.030)

## 2017-02-16 LAB — GLUCOSE, CAPILLARY: GLUCOSE-CAPILLARY: 146 mg/dL — AB (ref 65–99)

## 2017-02-16 LAB — HEMOGLOBIN A1C
Hgb A1c MFr Bld: 6 % — ABNORMAL HIGH (ref 4.8–5.6)
Mean Plasma Glucose: 125.5 mg/dL

## 2017-02-16 LAB — ABO/RH: ABO/RH(D): AB POS

## 2017-02-16 LAB — SURGICAL PCR SCREEN
MRSA, PCR: NEGATIVE
STAPHYLOCOCCUS AUREUS: NEGATIVE

## 2017-02-16 LAB — APTT: aPTT: 29 seconds (ref 24–36)

## 2017-02-16 LAB — TYPE AND SCREEN
ABO/RH(D): AB POS
ANTIBODY SCREEN: NEGATIVE

## 2017-02-16 LAB — PROTIME-INR
INR: 0.98
Prothrombin Time: 12.9 seconds (ref 11.4–15.2)

## 2017-02-16 NOTE — Pre-Procedure Instructions (Addendum)
Lynn Dixon  02/16/2017      [facility] - [place]ita Al[place], [place] - [place], [place] - [address] [place] [address] Phone: [phone] Fax: [phone]  [facility] - [place], [place] - [place], [place] - [address] [place] [address] Phone: [phone] Fax: [phone]    Your procedure is scheduled on  Nov. 16  Report to [facility] Admitting at 5:30 A.M.  Call this number if you have problems the morning of surgery:  [phone]   Remember:  Do not eat food or drink liquids after midnight.  Take these medicines the morning of surgery with A SIP OF WATER : albuterol inhaler-if needed,symbocort if needed,zyrtec,gabapentin, hydrocodone if needed, singular, omeprazole,zoloft.  7 days prior to surgery STOP taking any Aspirin (unless otherwise instructed by your surgeon), Aleve, Naproxen, Ibuprofen, Motrin, Advil, Goody's, BC's, all herbal medications, fish oil, and all vitamins     How to Manage Your Diabetes Before and After Surgery  Why is it important to control my blood sugar before and after surgery? . Improving blood sugar levels before and after surgery helps healing and can limit problems. . A way of improving blood sugar control is eating a healthy diet by: o  Eating less sugar and carbohydrates o  Increasing activity/exercise o  Talking with your doctor about reaching your blood sugar goals . High blood sugars (greater than 180 mg/dL) can raise your risk of infections and slow your recovery, so you will need to focus on controlling your diabetes during the weeks before surgery. . Make sure that the doctor who takes care of your diabetes knows about your planned surgery including the date and location.  How do I manage my blood sugar before surgery? . Check your blood sugar at least 4 times a day, starting 2 days before surgery, to make sure that the level is not too high or low. o Check your blood sugar the morning of  your surgery when you wake up and every 2 hours until you get to the Short Stay unit. . If your blood sugar is less than 70 mg/dL, you will need to treat for low blood sugar: o Do not take insulin. o Treat a low blood sugar (less than 70 mg/dL) with  cup of clear juice (cranberry or apple), 4 glucose tablets, OR glucose gel. Recheck blood sugar in 15 minutes after treatment (to make sure it is greater than 70 mg/dL). If your blood sugar is not greater than 70 mg/dL on recheck, call [phone] o  for further instructions. . Report your blood sugar to the short stay nurse when you get to Short Stay.  . If you are admitted to the hospital after surgery: o Your blood sugar will be checked by the staff and you will probably be given insulin after surgery (instead of oral diabetes medicines) to make sure you have good blood sugar levels. o The goal for blood sugar control after surgery is 80-180 mg/dL.      Do not wear jewelry, make-up or nail polish.  Do not wear lotions, powders, or perfumes, or deoderant.  Do not shave 48 hours prior to surgery.  Men may shave face and neck.  Do not bring valuables to the hospital.  [facility] is not responsible for any belongings or valuables.  Contacts, dentures or bridgework may not be worn into surgery.  Leave your suitcase in the car.  After surgery  it may be brought to your room.  For patients admitted to the hospital, discharge time will be determined by your treatment team.  Patients discharged the day of surgery will not be allowed to drive home.    Special instructions:   - Preparing For Surgery  Before surgery, you can play an important role. Because skin is not sterile, your skin needs to be as free of germs as possible. You can reduce the number of germs on your skin by washing with CHG (chlorahexidine gluconate) Soap before surgery.  CHG is an antiseptic cleaner which kills germs and bonds with the skin to continue killing  germs even after washing.  Please do not use if you have an allergy to CHG or antibacterial soaps. If your skin becomes reddened/irritated stop using the CHG.  Do not shave (including legs and underarms) for at least 48 hours prior to first CHG shower. It is OK to shave your face.  Please follow these instructions carefully.   1. Shower the NIGHT BEFORE SURGERY and the MORNING OF SURGERY with CHG.   2. If you chose to wash your hair, wash your hair first as usual with your normal shampoo.  3. After you shampoo, rinse your hair and body thoroughly to remove the shampoo.  4. Use CHG as you would any other liquid soap. You can apply CHG directly to the skin and wash gently with a scrungie or a clean washcloth.   5. Apply the CHG Soap to your body ONLY FROM THE NECK DOWN.  Do not use on open wounds or open sores. Avoid contact with your eyes, ears, mouth and genitals (private parts). Wash Face and genitals (private parts)  with your normal soap.  6. Wash thoroughly, paying special attention to the area where your surgery will be performed.  7. Thoroughly rinse your body with warm water from the neck down.  8. DO NOT shower/wash with your normal soap after using and rinsing off the CHG Soap.  9. Pat yourself dry with a CLEAN TOWEL.  10. Wear CLEAN PAJAMAS to bed the night before surgery, wear comfortable clothes the morning of surgery  11. Place CLEAN SHEETS on your bed the night of your first shower and DO NOT SLEEP WITH PETS.    Day of Surgery: Do not apply any deodorants/lotions. Please wear clean clothes to the hospital/surgery center.      Please read over the following fact sheets that you were given. Coughing and Deep Breathing, Total Joint Packet, MRSA Information and Surgical Site Infection Prevention

## 2017-02-16 NOTE — Progress Notes (Signed)
PCP: Dr. Selinda FlavinKevin  Howard [facility] [place]  Last sleep study 9 yrs. Ago Fasting sugars 107-117  Pt. To stop aspirin per Dr. Madelon Lipsaffrey 7 days prior to surgery.  Pt. Reported she is not taking lasix due to urinating so much and she is having urgency and having accidents. Encourage pt. To notify  Dr. Dimas AguasHoward of this , she agreed.  Called Dr. Candise Bowensaffrey's office for clearance note.

## 2017-02-16 NOTE — Pre-Procedure Instructions (Signed)
Lynn Dixon  02/16/2017      [facility] - [place], [place] - [place], [place] - [address] [place] [address] Phone: [phone] Fax: [phone]  [facility] - [place], [place] - [place], [place] - [address] [place] [address] Phone: [phone] Fax: [phone]    Your procedure is scheduled on  Nov. 16  Report to [facility] Admitting at 5:30 A.M.  Call this number if you have problems the morning of surgery:  [phone]   Remember:  Do not eat food or drink liquids after midnight.  Take these medicines the morning of surgery with A SIP OF WATER : albuterol inhaler-if needed,symbocort if needed,zyrtec,gabapentin, hydrocodone if needed, singular, omeprazole,zoloft.  7 days prior to surgery STOP taking any Aspirin (unless otherwise instructed by your surgeon), Aleve, Naproxen, Ibuprofen, Motrin, Advil, Goody's, BC's, all herbal medications, fish oil, and all vitamins     How to Manage Your Diabetes Before and After Surgery  Why is it important to control my blood sugar before and after surgery? . Improving blood sugar levels before and after surgery helps healing and can limit problems. . A way of improving blood sugar control is eating a healthy diet by: o  Eating less sugar and carbohydrates o  Increasing activity/exercise o  Talking with your doctor about reaching your blood sugar goals . High blood sugars (greater than 180 mg/dL) can raise your risk of infections and slow your recovery, so you will need to focus on controlling your diabetes during the weeks before surgery. . Make sure that the doctor who takes care of your diabetes knows about your planned surgery including the date and location.  How do I manage my blood sugar before surgery? . Check your blood sugar at least 4 times a day, starting 2 days before surgery, to make sure that the level is not too high or low. o Check your blood sugar the morning of  your surgery when you wake up and every 2 hours until you get to the Short Stay unit. . If your blood sugar is less than 70 mg/dL, you will need to treat for low blood sugar: o Do not take insulin. o Treat a low blood sugar (less than 70 mg/dL) with  cup of clear juice (cranberry or apple), 4 glucose tablets, OR glucose gel. Recheck blood sugar in 15 minutes after treatment (to make sure it is greater than 70 mg/dL). If your blood sugar is not greater than 70 mg/dL on recheck, call [phone] o  for further instructions. . Report your blood sugar to the short stay nurse when you get to Short Stay.  . If you are admitted to the hospital after surgery: o Your blood sugar will be checked by the staff and you will probably be given insulin after surgery (instead of oral diabetes medicines) to make sure you have good blood sugar levels. o The goal for blood sugar control after surgery is 80-180 mg/dL.      Do not wear jewelry, make-up or nail polish.  Do not wear lotions, powders, or perfumes, or deoderant.  Do not shave 48 hours prior to surgery.  Men may shave face and neck.  Do not bring valuables to the hospital.  [facility] is not responsible for any belongings or valuables.  Contacts, dentures or bridgework may not be worn into surgery.  Leave your suitcase in the car.  After surgery  it may be brought to your room.  For patients admitted to the hospital, discharge time will be determined by your treatment team.  Patients discharged the day of surgery will not be allowed to drive home.    Special instructions:   - Preparing For Surgery  Before surgery, you can play an important role. Because skin is not sterile, your skin needs to be as free of germs as possible. You can reduce the number of germs on your skin by washing with CHG (chlorahexidine gluconate) Soap before surgery.  CHG is an antiseptic cleaner which kills germs and bonds with the skin to continue killing  germs even after washing.  Please do not use if you have an allergy to CHG or antibacterial soaps. If your skin becomes reddened/irritated stop using the CHG.  Do not shave (including legs and underarms) for at least 48 hours prior to first CHG shower. It is OK to shave your face.  Please follow these instructions carefully.   1. Shower the NIGHT BEFORE SURGERY and the MORNING OF SURGERY with CHG.   2. If you chose to wash your hair, wash your hair first as usual with your normal shampoo.  3. After you shampoo, rinse your hair and body thoroughly to remove the shampoo.  4. Use CHG as you would any other liquid soap. You can apply CHG directly to the skin and wash gently with a scrungie or a clean washcloth.   5. Apply the CHG Soap to your body ONLY FROM THE NECK DOWN.  Do not use on open wounds or open sores. Avoid contact with your eyes, ears, mouth and genitals (private parts). Wash Face and genitals (private parts)  with your normal soap.  6. Wash thoroughly, paying special attention to the area where your surgery will be performed.  7. Thoroughly rinse your body with warm water from the neck down.  8. DO NOT shower/wash with your normal soap after using and rinsing off the CHG Soap.  9. Pat yourself dry with a CLEAN TOWEL.  10. Wear CLEAN PAJAMAS to bed the night before surgery, wear comfortable clothes the morning of surgery  11. Place CLEAN SHEETS on your bed the night of your first shower and DO NOT SLEEP WITH PETS.    Day of Surgery: Do not apply any deodorants/lotions. Please wear clean clothes to the hospital/surgery center.      Please read over the following fact sheets that you were given. Coughing and Deep Breathing, Total Joint Packet, MRSA Information and Surgical Site Infection Prevention

## 2017-02-17 NOTE — Progress Notes (Addendum)
Anesthesia Chart Review: Patient is a 66 year old female scheduled for left TKA on 02/27/17 by Dr. Frederico Hammananiel Caffrey.   Patient is a 66 year old female scheduled for left TKA on 02/27/17 by Dr. Frederico Hammananiel Caffrey.   History includes former smoker, HLD, OSA (uses CPAP), LLL CAP (hospitalized 02/2016, required intubation), hiatal hernia, HTN, GERD, depression, insomnia, COPD, exertional dyspnea, glaucoma, DM2 (diet controlled), cholecystectomy, appendectomy, breast reduction, right TKA, C3-6 ACDF 07/23/16, L4-5 diskectomy 12/03/12. BMI is consistent with morbid obesity.    - [facility] [place]. On 02/09/17, he cleared patient for surgery from a medical and cardiac standpoint with recommendation to monitor pulmonary function due to asthma and monitor blood sugars.  - She is not routinely followed by cardiology, but was seen on 08/08/14 by [facility] Cardiologist Dr. Abelardo DieselJeffrey Clevenger prior to breast reduction surgery. EKG showed SB and no CV symptoms, so she was cleared for that surgery without further cardiac testing.   Meds include albuterol, aspirin 81 mg (to hold 7 days prior to surgery), Symbicort, Zyrtec, Lasix (patient holding), Neurontin, Norco, lisinopril, melatonin, Singulair, fish oil, Prilosec, Zoloft, Zocor. She reported she has held Lasix for 1-2 weeks because she is limited in her activity due to her knee and could not make it to the bathroom in time. She denied new SOB or edema since holding. She was advised to let Dr. Dimas AguasHoward know.   BP (!) 117/56   Pulse 69   Temp 36.6 C   Resp 20   Ht 4' 9.5" (1.461 m)   Wt 208 lb (94.3 kg)   SpO2 95%   BMI 44.23 kg/m   EKG 07/15/16: SB at 52 bpm.  Echo 02/27/16: Study Conclusions - Left ventricle: The cavity size was normal. Wall thickness was   increased in a pattern of mild LVH. Systolic function was normal.   The estimated ejection fraction was in the range of 55% to 60%.   Wall motion was normal; there were no  regional wall motion   abnormalities. Doppler parameters are consistent with abnormal   left ventricular relaxation (grade 1 diastolic dysfunction).   Doppler parameters are consistent with indeterminate ventricular   filling pressure. - Aortic valve: Transvalvular velocity was within the normal range.   There was no stenosis. There was no regurgitation. - Mitral valve: Transvalvular velocity was within the normal range.   There was no evidence for stenosis. There was no regurgitation. - Right ventricle: The cavity size was normal. Wall thickness was   normal. Systolic function was normal. - Atrial septum: No defect or patent foramen ovale was identified. - Tricuspid valve: There was no regurgitation.  CXR 02/16/17: IMPRESSION: Normal chest radiographs.  Preoperative labs noted. Cr 0.92. Glucose 136, A1c 6.0, H/H 11.4/35.3, PLT 116K. PT/PTT WNL. T&S done.  If no acute cardiopulmonary symptoms or other new changes then I would anticipate that she can proceed as planned.  Velna Ochsllison Leisha Trinkle, PA-C [facility] Phone [phone] 02/17/2017 1:13 PM

## 2017-02-18 LAB — URINE CULTURE: Culture: 20000 — AB

## 2017-02-26 MED ORDER — SODIUM CHLORIDE 0.9 % IV SOLN: INTRAVENOUS | Status: DC

## 2017-02-26 MED ORDER — VANCOMYCIN HCL IN DEXTROSE 1-5 GM/200ML-% IV SOLN
1000.0000 mg | INTRAVENOUS | Status: AC
  Administered 2017-02-27: 1000 mg via INTRAVENOUS
  Filled 2017-02-26: qty 200

## 2017-02-26 MED ORDER — TRANEXAMIC ACID 1000 MG/10ML IV SOLN
2000.0000 mg | Freq: Once | INTRAVENOUS | Status: AC
  Administered 2017-02-27: 2000 mg via TOPICAL
  Filled 2017-02-26: qty 20

## 2017-02-26 MED ORDER — TRANEXAMIC ACID 1000 MG/10ML IV SOLN
1000.0000 mg | INTRAVENOUS | Status: AC
  Administered 2017-02-27: 1000 mg via INTRAVENOUS
  Filled 2017-02-26: qty 1100

## 2017-02-26 MED ORDER — BUPIVACAINE LIPOSOME 1.3 % IJ SUSP
20.0000 mL | INTRAMUSCULAR | Status: AC
  Administered 2017-02-27: 20 mL
  Filled 2017-02-26: qty 20

## 2017-02-27 ENCOUNTER — Encounter (HOSPITAL_COMMUNITY): Payer: Self-pay | Admitting: *Deleted

## 2017-02-27 ENCOUNTER — Inpatient Hospital Stay (HOSPITAL_COMMUNITY): Payer: Managed Care, Other (non HMO) | Admitting: Anesthesiology

## 2017-02-27 ENCOUNTER — Encounter (HOSPITAL_COMMUNITY)
Admission: RE | Disposition: A | Discharge status: Home Health | Payer: Self-pay | Source: Ambulatory Visit | Attending: Orthopedic Surgery

## 2017-02-27 ENCOUNTER — Inpatient Hospital Stay (HOSPITAL_COMMUNITY): Payer: Managed Care, Other (non HMO) | Admitting: Vascular Surgery

## 2017-02-27 ENCOUNTER — Inpatient Hospital Stay (HOSPITAL_COMMUNITY): Payer: Managed Care, Other (non HMO)

## 2017-02-27 ENCOUNTER — Inpatient Hospital Stay (HOSPITAL_COMMUNITY)
Admission: RE | Admit: 2017-02-27 | Discharge: 2017-03-02 | DRG: 470 | Disposition: A | Discharge status: Home Health | Payer: Managed Care, Other (non HMO) | Source: Ambulatory Visit | Attending: Orthopedic Surgery | Admitting: Orthopedic Surgery

## 2017-02-27 ENCOUNTER — Other Ambulatory Visit: Payer: Self-pay

## 2017-02-27 DIAGNOSIS — M1712 Unilateral primary osteoarthritis, left knee: Principal | ICD-10-CM | POA: Diagnosis present

## 2017-02-27 DIAGNOSIS — Z87891 Personal history of nicotine dependence: Secondary | ICD-10-CM

## 2017-02-27 DIAGNOSIS — G4700 Insomnia, unspecified: Secondary | ICD-10-CM | POA: Diagnosis present

## 2017-02-27 DIAGNOSIS — H409 Unspecified glaucoma: Secondary | ICD-10-CM | POA: Diagnosis present

## 2017-02-27 DIAGNOSIS — E785 Hyperlipidemia, unspecified: Secondary | ICD-10-CM | POA: Diagnosis present

## 2017-02-27 DIAGNOSIS — I10 Essential (primary) hypertension: Secondary | ICD-10-CM | POA: Diagnosis present

## 2017-02-27 DIAGNOSIS — Z79899 Other long term (current) drug therapy: Secondary | ICD-10-CM | POA: Diagnosis not present

## 2017-02-27 DIAGNOSIS — E119 Type 2 diabetes mellitus without complications: Secondary | ICD-10-CM | POA: Diagnosis present

## 2017-02-27 DIAGNOSIS — Z981 Arthrodesis status: Secondary | ICD-10-CM

## 2017-02-27 DIAGNOSIS — Z7982 Long term (current) use of aspirin: Secondary | ICD-10-CM | POA: Diagnosis not present

## 2017-02-27 DIAGNOSIS — Z6841 Body Mass Index (BMI) 40.0 and over, adult: Secondary | ICD-10-CM | POA: Diagnosis not present

## 2017-02-27 DIAGNOSIS — K219 Gastro-esophageal reflux disease without esophagitis: Secondary | ICD-10-CM | POA: Diagnosis present

## 2017-02-27 DIAGNOSIS — F329 Major depressive disorder, single episode, unspecified: Secondary | ICD-10-CM | POA: Diagnosis present

## 2017-02-27 DIAGNOSIS — F4310 Post-traumatic stress disorder, unspecified: Secondary | ICD-10-CM | POA: Diagnosis present

## 2017-02-27 DIAGNOSIS — G4733 Obstructive sleep apnea (adult) (pediatric): Secondary | ICD-10-CM | POA: Diagnosis present

## 2017-02-27 DIAGNOSIS — J449 Chronic obstructive pulmonary disease, unspecified: Secondary | ICD-10-CM | POA: Diagnosis present

## 2017-02-27 DIAGNOSIS — Z96651 Presence of right artificial knee joint: Secondary | ICD-10-CM | POA: Diagnosis present

## 2017-02-27 DIAGNOSIS — M25562 Pain in left knee: Secondary | ICD-10-CM | POA: Diagnosis present

## 2017-02-27 DIAGNOSIS — Z96652 Presence of left artificial knee joint: Secondary | ICD-10-CM

## 2017-02-27 HISTORY — PX: TOTAL KNEE ARTHROPLASTY: SHX125

## 2017-02-27 LAB — GLUCOSE, CAPILLARY
GLUCOSE-CAPILLARY: 106 mg/dL — AB (ref 65–99)
Glucose-Capillary: 104 mg/dL — ABNORMAL HIGH (ref 65–99)
Glucose-Capillary: 123 mg/dL — ABNORMAL HIGH (ref 65–99)
Glucose-Capillary: 121 mg/dL — ABNORMAL HIGH (ref 65–99)

## 2017-02-27 LAB — PLATELET COUNT: PLATELETS: 134 10*3/uL — AB (ref 150–400)

## 2017-02-27 SURGERY — ARTHROPLASTY, KNEE, TOTAL
Anesthesia: Monitor Anesthesia Care | Site: Knee | Laterality: Left

## 2017-02-27 MED ORDER — METOCLOPRAMIDE HCL 5 MG/ML IJ SOLN: 5.0000 mg | Freq: Three times a day (TID) | INTRAMUSCULAR | Status: DC | PRN

## 2017-02-27 MED ORDER — PHENYLEPHRINE HCL 10 MG/ML IJ SOLN
INTRAVENOUS | Status: DC | PRN
  Administered 2017-02-27: 30 ug/min via INTRAVENOUS

## 2017-02-27 MED ORDER — HYDROMORPHONE HCL 1 MG/ML IJ SOLN
0.5000 mg | INTRAMUSCULAR | Status: DC | PRN
  Administered 2017-02-27 – 2017-02-28 (×4): 0.5 mg via INTRAVENOUS
  Filled 2017-02-27 (×5): qty 1

## 2017-02-27 MED ORDER — MELATONIN 3 MG PO TABS
6.0000 mg | ORAL_TABLET | Freq: Every day | ORAL | Status: DC
  Administered 2017-02-27 – 2017-03-01 (×3): 6 mg via ORAL
  Filled 2017-02-27 (×3): qty 2

## 2017-02-27 MED ORDER — PROPOFOL 500 MG/50ML IV EMUL
INTRAVENOUS | Status: DC | PRN
  Administered 2017-02-27: 30 ug/kg/min via INTRAVENOUS

## 2017-02-27 MED ORDER — ONDANSETRON HCL 4 MG/2ML IJ SOLN: 4.0000 mg | Freq: Four times a day (QID) | INTRAMUSCULAR | Status: DC | PRN

## 2017-02-27 MED ORDER — ONDANSETRON HCL 4 MG/2ML IJ SOLN
INTRAMUSCULAR | Status: DC | PRN
  Administered 2017-02-27: 4 mg via INTRAVENOUS

## 2017-02-27 MED ORDER — APIXABAN 2.5 MG PO TABS: 2.5000 mg | ORAL_TABLET | Freq: Two times a day (BID) | ORAL | 0 refills | Status: DC

## 2017-02-27 MED ORDER — MENTHOL 3 MG MT LOZG: 1.0000 | LOZENGE | OROMUCOSAL | Status: DC | PRN

## 2017-02-27 MED ORDER — VANCOMYCIN HCL IN DEXTROSE 1-5 GM/200ML-% IV SOLN
1000.0000 mg | Freq: Two times a day (BID) | INTRAVENOUS | Status: AC
  Administered 2017-02-27: 1000 mg via INTRAVENOUS
  Filled 2017-02-27: qty 200

## 2017-02-27 MED ORDER — ACETAMINOPHEN 650 MG RE SUPP: 650.0000 mg | RECTAL | Status: DC | PRN

## 2017-02-27 MED ORDER — SIMVASTATIN 20 MG PO TABS
20.0000 mg | ORAL_TABLET | Freq: Every day | ORAL | Status: DC
  Administered 2017-02-27 – 2017-03-01 (×3): 20 mg via ORAL
  Filled 2017-02-27 (×3): qty 1

## 2017-02-27 MED ORDER — ADULT MULTIVITAMIN W/MINERALS CH
1.0000 | ORAL_TABLET | Freq: Every day | ORAL | Status: DC
  Administered 2017-02-28 – 2017-03-02 (×3): 1 via ORAL
  Filled 2017-02-27 (×3): qty 1

## 2017-02-27 MED ORDER — MIDAZOLAM HCL 5 MG/5ML IJ SOLN
INTRAMUSCULAR | Status: DC | PRN
  Administered 2017-02-27: 2 mg via INTRAVENOUS

## 2017-02-27 MED ORDER — OXYCODONE HCL 5 MG PO TABS
5.0000 mg | ORAL_TABLET | ORAL | Status: DC | PRN
  Administered 2017-03-01 – 2017-03-02 (×2): 5 mg via ORAL
  Filled 2017-02-27 (×3): qty 1

## 2017-02-27 MED ORDER — PROPOFOL 10 MG/ML IV BOLUS
INTRAVENOUS | Status: DC | PRN
  Administered 2017-02-27: 20 mg via INTRAVENOUS

## 2017-02-27 MED ORDER — PANTOPRAZOLE SODIUM 40 MG PO TBEC
40.0000 mg | DELAYED_RELEASE_TABLET | Freq: Every day | ORAL | Status: DC
  Administered 2017-02-28 – 2017-03-02 (×3): 40 mg via ORAL
  Filled 2017-02-27 (×3): qty 1

## 2017-02-27 MED ORDER — METOCLOPRAMIDE HCL 5 MG PO TABS: 5.0000 mg | ORAL_TABLET | Freq: Three times a day (TID) | ORAL | Status: DC | PRN

## 2017-02-27 MED ORDER — ALBUTEROL SULFATE (2.5 MG/3ML) 0.083% IN NEBU
2.5000 mg | INHALATION_SOLUTION | Freq: Four times a day (QID) | RESPIRATORY_TRACT | Status: DC | PRN
Start: 2017-02-27 — End: 2017-03-02

## 2017-02-27 MED ORDER — GLYCOPYRROLATE 0.2 MG/ML IJ SOLN
INTRAMUSCULAR | Status: DC | PRN
  Administered 2017-02-27: 0.2 mg via INTRAVENOUS

## 2017-02-27 MED ORDER — OXYCODONE HCL 5 MG PO TABS
10.0000 mg | ORAL_TABLET | ORAL | Status: DC | PRN
  Administered 2017-02-27 – 2017-03-02 (×10): 10 mg via ORAL
  Filled 2017-02-27 (×11): qty 2

## 2017-02-27 MED ORDER — ROPIVACAINE HCL 7.5 MG/ML IJ SOLN
INTRAMUSCULAR | Status: DC | PRN
  Administered 2017-02-27: 20 mL via PERINEURAL

## 2017-02-27 MED ORDER — PROPOFOL 10 MG/ML IV BOLUS
INTRAVENOUS | Status: AC
  Filled 2017-02-27: qty 20

## 2017-02-27 MED ORDER — SORBITOL 70 % SOLN: 30.0000 mL | Freq: Every day | Status: DC | PRN

## 2017-02-27 MED ORDER — MOMETASONE FURO-FORMOTEROL FUM 200-5 MCG/ACT IN AERO
2.0000 | INHALATION_SPRAY | Freq: Two times a day (BID) | RESPIRATORY_TRACT | Status: DC
  Administered 2017-02-27 – 2017-03-02 (×6): 2 via RESPIRATORY_TRACT
  Filled 2017-02-27: qty 8.8

## 2017-02-27 MED ORDER — INSULIN ASPART 100 UNIT/ML ~~LOC~~ SOLN
0.0000 [IU] | Freq: Three times a day (TID) | SUBCUTANEOUS | Status: DC
  Administered 2017-02-27: 2 [IU] via SUBCUTANEOUS
  Administered 2017-02-28: 3 [IU] via SUBCUTANEOUS
  Administered 2017-02-28 – 2017-03-02 (×3): 2 [IU] via SUBCUTANEOUS

## 2017-02-27 MED ORDER — PHENOL 1.4 % MT LIQD: 1.0000 | OROMUCOSAL | Status: DC | PRN

## 2017-02-27 MED ORDER — SERTRALINE HCL 100 MG PO TABS
100.0000 mg | ORAL_TABLET | Freq: Two times a day (BID) | ORAL | Status: DC
  Administered 2017-02-27 – 2017-03-02 (×6): 100 mg via ORAL
  Filled 2017-02-27 (×6): qty 1

## 2017-02-27 MED ORDER — INSULIN ASPART 100 UNIT/ML ~~LOC~~ SOLN
0.0000 [IU] | Freq: Every day | SUBCUTANEOUS | Status: DC
Start: 2017-02-27 — End: 2017-03-02

## 2017-02-27 MED ORDER — SODIUM CHLORIDE 0.9 % IR SOLN
Status: DC | PRN
  Administered 2017-02-27: 3000 mL

## 2017-02-27 MED ORDER — GABAPENTIN 300 MG PO CAPS
900.0000 mg | ORAL_CAPSULE | Freq: Two times a day (BID) | ORAL | Status: DC
  Administered 2017-02-27 – 2017-03-02 (×6): 900 mg via ORAL
  Filled 2017-02-27 (×6): qty 3

## 2017-02-27 MED ORDER — MIDAZOLAM HCL 2 MG/2ML IJ SOLN
INTRAMUSCULAR | Status: AC
  Filled 2017-02-27: qty 2

## 2017-02-27 MED ORDER — APIXABAN 2.5 MG PO TABS
2.5000 mg | ORAL_TABLET | Freq: Two times a day (BID) | ORAL | Status: DC
  Administered 2017-02-28 – 2017-03-02 (×5): 2.5 mg via ORAL
  Filled 2017-02-27 (×5): qty 1

## 2017-02-27 MED ORDER — OXYCODONE-ACETAMINOPHEN 5-325 MG PO TABS: 1.0000 | ORAL_TABLET | ORAL | 0 refills | Status: DC | PRN

## 2017-02-27 MED ORDER — FUROSEMIDE 40 MG PO TABS: 40.0000 mg | ORAL_TABLET | Freq: Every day | ORAL | Status: DC

## 2017-02-27 MED ORDER — LORATADINE 10 MG PO TABS
10.0000 mg | ORAL_TABLET | Freq: Every day | ORAL | Status: DC
  Administered 2017-02-28 – 2017-03-02 (×3): 10 mg via ORAL
  Filled 2017-02-27 (×3): qty 1

## 2017-02-27 MED ORDER — POLYETHYLENE GLYCOL 3350 17 G PO PACK: 17.0000 g | PACK | Freq: Every day | ORAL | Status: DC | PRN

## 2017-02-27 MED ORDER — BUPIVACAINE-EPINEPHRINE 0.25% -1:200000 IJ SOLN
INTRAMUSCULAR | Status: AC
  Filled 2017-02-27: qty 1

## 2017-02-27 MED ORDER — CHLORHEXIDINE GLUCONATE 4 % EX LIQD: 60.0000 mL | Freq: Once | CUTANEOUS | Status: DC

## 2017-02-27 MED ORDER — INSULIN ASPART 100 UNIT/ML ~~LOC~~ SOLN
4.0000 [IU] | Freq: Three times a day (TID) | SUBCUTANEOUS | Status: DC
  Administered 2017-02-27 – 2017-03-02 (×7): 4 [IU] via SUBCUTANEOUS

## 2017-02-27 MED ORDER — MONTELUKAST SODIUM 10 MG PO TABS
10.0000 mg | ORAL_TABLET | Freq: Every day | ORAL | Status: DC
  Administered 2017-02-28 – 2017-03-02 (×3): 10 mg via ORAL
  Filled 2017-02-27 (×3): qty 1

## 2017-02-27 MED ORDER — TRANEXAMIC ACID 1000 MG/10ML IV SOLN
1000.0000 mg | Freq: Once | INTRAVENOUS | Status: AC
  Administered 2017-02-27: 1000 mg via INTRAVENOUS
  Filled 2017-02-27: qty 10

## 2017-02-27 MED ORDER — SENNA 8.6 MG PO TABS
1.0000 | ORAL_TABLET | Freq: Every day | ORAL | Status: DC
  Administered 2017-02-28 – 2017-03-02 (×3): 8.6 mg via ORAL
  Filled 2017-02-27 (×3): qty 1

## 2017-02-27 MED ORDER — ONDANSETRON HCL 4 MG PO TABS: 4.0000 mg | ORAL_TABLET | Freq: Four times a day (QID) | ORAL | Status: DC | PRN

## 2017-02-27 MED ORDER — ACETAMINOPHEN 325 MG PO TABS
650.0000 mg | ORAL_TABLET | ORAL | Status: DC | PRN
  Administered 2017-02-27 – 2017-03-02 (×6): 650 mg via ORAL
  Filled 2017-02-27 (×6): qty 2

## 2017-02-27 MED ORDER — FENTANYL CITRATE (PF) 250 MCG/5ML IJ SOLN
INTRAMUSCULAR | Status: AC
  Filled 2017-02-27: qty 5

## 2017-02-27 MED ORDER — VITAMIN D 1000 UNITS PO TABS: 1000.0000 [IU] | ORAL_TABLET | Freq: Every day | ORAL | Status: DC

## 2017-02-27 MED ORDER — LISINOPRIL 20 MG PO TABS
20.0000 mg | ORAL_TABLET | Freq: Every day | ORAL | Status: DC
  Administered 2017-02-28: 20 mg via ORAL
  Filled 2017-02-27 (×3): qty 1

## 2017-02-27 MED ORDER — BUPIVACAINE IN DEXTROSE 0.75-8.25 % IT SOLN
INTRATHECAL | Status: DC | PRN
  Administered 2017-02-27: 2 mL via INTRATHECAL

## 2017-02-27 MED ORDER — LACTATED RINGERS IV SOLN
INTRAVENOUS | Status: DC | PRN
  Administered 2017-02-27 (×2): via INTRAVENOUS

## 2017-02-27 MED ORDER — FLEET ENEMA 7-19 GM/118ML RE ENEM
1.0000 | ENEMA | Freq: Once | RECTAL | Status: DC | PRN
Start: 2017-02-27 — End: 2017-03-02

## 2017-02-27 MED ORDER — SODIUM CHLORIDE 0.9 % IV SOLN
INTRAVENOUS | Status: DC
  Administered 2017-02-27: 14:00:00 via INTRAVENOUS

## 2017-02-27 MED ORDER — BUPIVACAINE-EPINEPHRINE (PF) 0.25% -1:200000 IJ SOLN
INTRAMUSCULAR | Status: DC | PRN
  Administered 2017-02-27: 50 mL

## 2017-02-27 MED ORDER — SODIUM CHLORIDE 0.9% FLUSH
INTRAVENOUS | Status: DC | PRN
  Administered 2017-02-27: 50 mL

## 2017-02-27 MED ORDER — ONDANSETRON HCL 4 MG/2ML IJ SOLN
INTRAMUSCULAR | Status: AC
  Filled 2017-02-27: qty 2

## 2017-02-27 SURGICAL SUPPLY — 73 items
BANDAGE ACE 4X5 VEL STRL LF (GAUZE/BANDAGES/DRESSINGS) ×3 IMPLANT
BANDAGE ACE 6X5 VEL STRL LF (GAUZE/BANDAGES/DRESSINGS) ×3 IMPLANT
BANDAGE ELASTIC 4 VELCRO ST LF (GAUZE/BANDAGES/DRESSINGS) ×2 IMPLANT
BANDAGE ELASTIC 6 VELCRO ST LF (GAUZE/BANDAGES/DRESSINGS) ×2 IMPLANT
BANDAGE ESMARK 6X9 LF (GAUZE/BANDAGES/DRESSINGS) ×1 IMPLANT
BIT DRILL CANN 2.7X625 NONSTRL (BIT) ×2 IMPLANT
BLADE SAGITTAL 25.0X1.19X90 (BLADE) ×2 IMPLANT
BLADE SAGITTAL 25.0X1.19X90MM (BLADE) ×1
BLADE SAW SAG 90X13X1.27 (BLADE) ×3 IMPLANT
BNDG CMPR 9X6 STRL LF SNTH (GAUZE/BANDAGES/DRESSINGS) ×1
BNDG ESMARK 6X9 LF (GAUZE/BANDAGES/DRESSINGS) ×3
BONE CEMENT GENTAMICIN (Cement) ×12 IMPLANT
BOWL SMART MIX CTS (DISPOSABLE) ×3 IMPLANT
CAP KNEE TOTAL 3 SIGMA ×2 IMPLANT
CEMENT BONE GENTAMICIN 40 (Cement) IMPLANT
COVER SURGICAL LIGHT HANDLE (MISCELLANEOUS) ×3 IMPLANT
CUFF TOURNIQUET SINGLE 34IN LL (TOURNIQUET CUFF) ×3 IMPLANT
CUFF TOURNIQUET SINGLE 44IN (TOURNIQUET CUFF) IMPLANT
DRAPE INCISE IOBAN 66X45 STRL (DRAPES) IMPLANT
DRAPE ORTHO SPLIT 77X108 STRL (DRAPES) ×6
DRAPE SURG ORHT 6 SPLT 77X108 (DRAPES) ×2 IMPLANT
DRAPE U-SHAPE 47X51 STRL (DRAPES) ×3 IMPLANT
DRSG ADAPTIC 3X8 NADH LF (GAUZE/BANDAGES/DRESSINGS) ×3 IMPLANT
DRSG PAD ABDOMINAL 8X10 ST (GAUZE/BANDAGES/DRESSINGS) ×6 IMPLANT
DURAPREP 26ML APPLICATOR (WOUND CARE) ×3 IMPLANT
ELECT REM PT RETURN 9FT ADLT (ELECTROSURGICAL) ×3
ELECTRODE REM PT RTRN 9FT ADLT (ELECTROSURGICAL) ×1 IMPLANT
EVACUATOR 1/8 PVC DRAIN (DRAIN) IMPLANT
FACESHIELD WRAPAROUND (MASK) ×3 IMPLANT
FACESHIELD WRAPAROUND OR TEAM (MASK) ×1 IMPLANT
GAUZE SPONGE 4X4 12PLY STRL (GAUZE/BANDAGES/DRESSINGS) ×3 IMPLANT
GAUZE SPONGE 4X4 12PLY STRL LF (GAUZE/BANDAGES/DRESSINGS) ×2 IMPLANT
GLOVE BIOGEL PI IND STRL 8 (GLOVE) ×4 IMPLANT
GLOVE BIOGEL PI INDICATOR 8 (GLOVE) ×8
GLOVE ORTHO TXT STRL SZ7.5 (GLOVE) ×3 IMPLANT
GLOVE SURG ORTHO 8.0 STRL STRW (GLOVE) ×3 IMPLANT
GOWN STRL REUS W/ TWL LRG LVL3 (GOWN DISPOSABLE) ×1 IMPLANT
GOWN STRL REUS W/ TWL XL LVL3 (GOWN DISPOSABLE) ×1 IMPLANT
GOWN STRL REUS W/TWL 2XL LVL3 (GOWN DISPOSABLE) ×3 IMPLANT
GOWN STRL REUS W/TWL LRG LVL3 (GOWN DISPOSABLE) ×3
GOWN STRL REUS W/TWL XL LVL3 (GOWN DISPOSABLE) ×3
GUIDEWIRE THREADED 150MM (WIRE) ×2 IMPLANT
HANDPIECE INTERPULSE COAX TIP (DISPOSABLE) ×3
HOOD PEEL AWAY FACE SHEILD DIS (HOOD) ×3 IMPLANT
IMMOBILIZER KNEE 22 UNIV (SOFTGOODS) IMPLANT
KIT BASIN OR (CUSTOM PROCEDURE TRAY) ×3 IMPLANT
KIT ROOM TURNOVER OR (KITS) ×3 IMPLANT
MANIFOLD NEPTUNE II (INSTRUMENTS) ×3 IMPLANT
NEEDLE 22X1 1/2 (OR ONLY) (NEEDLE) ×6 IMPLANT
NS IRRIG 1000ML POUR BTL (IV SOLUTION) ×3 IMPLANT
PACK TOTAL JOINT (CUSTOM PROCEDURE TRAY) ×3 IMPLANT
PAD ABD 8X10 STRL (GAUZE/BANDAGES/DRESSINGS) ×2 IMPLANT
PAD ARMBOARD 7.5X6 YLW CONV (MISCELLANEOUS) ×6 IMPLANT
PAD CAST 4YDX4 CTTN HI CHSV (CAST SUPPLIES) ×1 IMPLANT
PADDING CAST COTTON 4X4 STRL (CAST SUPPLIES) ×3
PADDING CAST COTTON 6X4 STRL (CAST SUPPLIES) ×3 IMPLANT
SCREW CANN S THRD/50 4.0 (Screw) ×2 IMPLANT
SCREW CANN SHORT THD 4.0X18 ST (Screw) ×2 IMPLANT
SCREW CANN SHORT THD 4.0X20 ST (Screw) ×2 IMPLANT
SET HNDPC FAN SPRY TIP SCT (DISPOSABLE) ×1 IMPLANT
STAPLER VISISTAT 35W (STAPLE) ×3 IMPLANT
SUCTION FRAZIER HANDLE 10FR (MISCELLANEOUS) ×2
SUCTION TUBE FRAZIER 10FR DISP (MISCELLANEOUS) ×1 IMPLANT
SUT ETHIBOND NAB CT1 #1 30IN (SUTURE) ×9 IMPLANT
SUT VIC AB 0 CT1 27 (SUTURE) ×3
SUT VIC AB 0 CT1 27XBRD ANBCTR (SUTURE) ×1 IMPLANT
SUT VIC AB 2-0 CT1 27 (SUTURE) ×6
SUT VIC AB 2-0 CT1 TAPERPNT 27 (SUTURE) ×2 IMPLANT
SYR CONTROL 10ML LL (SYRINGE) ×6 IMPLANT
TOWEL GREEN STERILE (TOWEL DISPOSABLE) ×3 IMPLANT
TRAY CATH 16FR W/PLASTIC CATH (SET/KITS/TRAYS/PACK) IMPLANT
TRAY FOLEY W/METER SILVER 16FR (SET/KITS/TRAYS/PACK) IMPLANT
WATER STERILE IRR 1000ML POUR (IV SOLUTION) ×3 IMPLANT

## 2017-02-27 NOTE — Anesthesia Postprocedure Evaluation (Signed)
Anesthesia Post Note  Patient: Bruna Potter  Procedure(s) Performed: LEFT TOTAL KNEE ARTHROPLASTY (Left Knee)     Patient location during evaluation: PACU Anesthesia Type: Spinal and MAC Level of consciousness: awake and alert Pain management: pain level controlled Vital Signs Assessment: post-procedure vital signs reviewed and stable Respiratory status: spontaneous breathing, nonlabored ventilation, respiratory function stable and patient connected to nasal cannula oxygen Cardiovascular status: stable and blood pressure returned to baseline Postop Assessment: no apparent nausea or vomiting and spinal receding Anesthetic complications: no    Last Vitals:  Vitals:   02/27/17 2116 02/27/17 2118  BP: (!) 79/44 (!) 100/53  Pulse: 85   Resp:    Temp: (!) 38.1 C   SpO2: 92%     Last Pain:  Vitals:   02/27/17 2116  TempSrc: Oral  PainSc:                  Jakyla Reza

## 2017-02-27 NOTE — Progress Notes (Signed)
Orthopedic Tech Progress Note Patient Details:  Lynn Dixon [birth year] [id number]  Ortho Devices Ortho Device/Splint Location: bone foam provided at bedside.    Alvina ChouWilliams, Kalei Meda C 02/27/2017, 11:21 AM

## 2017-02-27 NOTE — Brief Op Note (Signed)
02/27/2017  10:23 AM  PATIENT:  Lynn Dixon  66 y.o. female  PRE-OPERATIVE DIAGNOSIS:  OA LEFT KNEE  POST-OPERATIVE DIAGNOSIS:  OA LEFT KNEE  PROCEDURE:  Procedure(s): LEFT TOTAL KNEE ARTHROPLASTY (Left)  ORIF medial and lateral femoral condyles   SURGEON:  Surgeon(s) and Role:    Frederico Hamman* Caffrey, Daniel, MD - Primary  PHYSICIAN ASSISTANT: Margart SicklesJoshua Viaan Knippenberg, PA-C  ASSISTANTS:    ANESTHESIA:   local and spinal  EBL:  100 mL   BLOOD ADMINISTERED:none  DRAINS: none   LOCAL MEDICATIONS USED:  MARCAINE     SPECIMEN:  No Specimen  DISPOSITION OF SPECIMEN:  N/A  COUNTS:  YES  TOURNIQUET:   Total Tourniquet Time Documented: Thigh (Left) - 97 minutes Total: Thigh (Left) - 97 minutes   DICTATION: .Other Dictation: Dictation Number unknown  PLAN OF CARE: Admit to inpatient   PATIENT DISPOSITION:  PACU - hemodynamically stable.   Delay start of Pharmacological VTE agent (>24hrs) due to surgical blood loss or risk of bleeding: yes

## 2017-02-27 NOTE — Interval H&P Note (Signed)
History and Physical Interval Note:  02/27/2017 7:32 AM  Lynn Dixon  has presented today for surgery, with the diagnosis of OA LEFT KNEE  The various methods of treatment have been discussed with the patient and family. After consideration of risks, benefits and other options for treatment, the patient has consented to  Procedure(s): LEFT TOTAL KNEE ARTHROPLASTY (Left) as a surgical intervention .  The patient's history has been reviewed, patient examined, no change in status, stable for surgery.  I have reviewed the patient's chart and labs.  Questions were answered to the patient's satisfaction.     Thera FlakeW D Shequita Peplinski Jr

## 2017-02-27 NOTE — Transfer of Care (Signed)
Immediate Anesthesia Transfer of Care Note  Patient: Lynn Dixon  Procedure(s) Performed: LEFT TOTAL KNEE ARTHROPLASTY (Left Knee)  Patient Location: PACU  Anesthesia Type:Spinal  Level of Consciousness: awake, alert  and oriented  Airway & Oxygen Therapy: Patient Spontanous Breathing and Patient connected to face mask oxygen  Post-op Assessment: Report given to RN and Post -op Vital signs reviewed and stable  Post vital signs: Reviewed and stable  Last Vitals:  Vitals:   02/27/17 0612  BP: 107/74  Pulse: (!) 57  Resp: 20  Temp: 36.7 C  SpO2: 95%    Last Pain:  Vitals:   02/27/17 0637  TempSrc:   PainSc: 5          Complications: No apparent anesthesia complications

## 2017-02-27 NOTE — Anesthesia Procedure Notes (Signed)
Procedure Name: MAC Date/Time: 02/27/2017 8:00 AM Performed by: Kyung Rudd, CRNA Pre-anesthesia Checklist: Patient identified, Emergency Drugs available, Suction available and Patient being monitored Patient Re-evaluated:Patient Re-evaluated prior to induction Oxygen Delivery Method: Simple face mask Preoxygenation: Pre-oxygenation with 100% oxygen Induction Type: IV induction Placement Confirmation: positive ETCO2 Dental Injury: Teeth and Oropharynx as per pre-operative assessment

## 2017-02-27 NOTE — Discharge Instructions (Signed)
INSTRUCTIONS AFTER JOINT REPLACEMENT   o Remove items at home which could result in a fall. This includes throw rugs or furniture in walking pathways o ICE to the affected joint every three hours while awake for 30 minutes at a time, for at least the first 3-5 days, and then as needed for pain and swelling.  Continue to use ice for pain and swelling. You may notice swelling that will progress down to the foot and ankle.  This is normal after surgery.  Elevate your leg when you are not up walking on it.   o Continue to use the breathing machine you got in the hospital (incentive spirometer) which will help keep your temperature down.  It is common for your temperature to cycle up and down following surgery, especially at night when you are not up moving around and exerting yourself.  The breathing machine keeps your lungs expanded and your temperature down.   DIET:  As you were doing prior to hospitalization, we recommend a well-balanced diet.  DRESSING / WOUND CARE / SHOWERING  You may change your dressing 3-5 days after surgery.  Then change the dressing every day with sterile gauze.  Please use good hand washing techniques before changing the dressing.  Do not use any lotions or creams on the incision until instructed by your surgeon.  ACTIVITY  o Increase activity slowly as tolerated, but follow the weight bearing instructions below.   o No driving for 6 weeks or until further direction given by your physician.  You cannot drive while taking narcotics.  o No lifting or carrying greater than 10 lbs. until further directed by your surgeon. o Avoid periods of inactivity such as sitting longer than an hour when not asleep. This helps prevent blood clots.  o You may return to work once you are authorized by your doctor.     WEIGHT BEARING   Partial weight bearing with assist device as directed.  50% weight on operative leg, with walker.   EXERCISES  Results after joint replacement surgery  are often greatly improved when you follow the exercise, range of motion and muscle strengthening exercises prescribed by your doctor. Safety measures are also important to protect the joint from further injury. Any time any of these exercises cause you to have increased pain or swelling, decrease what you are doing until you are comfortable again and then slowly increase them. If you have problems or questions, call your caregiver or physical therapist for advice.   Rehabilitation is important following a joint replacement. After just a few days of immobilization, the muscles of the leg can become weakened and shrink (atrophy).  These exercises are designed to build up the tone and strength of the thigh and leg muscles and to improve motion. Often times heat used for twenty to thirty minutes before working out will loosen up your tissues and help with improving the range of motion but do not use heat for the first two weeks following surgery (sometimes heat can increase post-operative swelling).   These exercises can be done on a training (exercise) mat, on the floor, on a table or on a bed. Use whatever works the best and is most comfortable for you.    Use music or television while you are exercising so that the exercises are a pleasant break in your day. This will make your life better with the exercises acting as a break in your routine that you can look forward to.   Perform  all exercises about fifteen times, three times per day or as directed.  You should exercise both the operative leg and the other leg as well.  Exercises include:    Quad Sets - Tighten up the muscle on the front of the thigh (Quad) and hold for 5-10 seconds.    Straight Leg Raises - With your knee straight (if you were given a brace, keep it on), lift the leg to 60 degrees, hold for 3 seconds, and slowly lower the leg.  Perform this exercise against resistance later as your leg gets stronger.   Leg Slides: Lying on your back,  slowly slide your foot toward your buttocks, bending your knee up off the floor (only go as far as is comfortable). Then slowly slide your foot back down until your leg is flat on the floor again.   Angel Wings: Lying on your back spread your legs to the side as far apart as you can without causing discomfort.   Hamstring Strength:  Lying on your back, push your heel against the floor with your leg straight by tightening up the muscles of your buttocks.  Repeat, but this time bend your knee to a comfortable angle, and push your heel against the floor.  You may put a pillow under the heel to make it more comfortable if necessary.   A rehabilitation program following joint replacement surgery can speed recovery and prevent re-injury in the future due to weakened muscles. Contact your doctor or a physical therapist for more information on knee rehabilitation.    CONSTIPATION  Constipation is defined medically as fewer than three stools per week and severe constipation as less than one stool per week.  Even if you have a regular bowel pattern at home, your normal regimen is likely to be disrupted due to multiple reasons following surgery.  Combination of anesthesia, postoperative narcotics, change in appetite and fluid intake all can affect your bowels.   YOU MUST use at least one of the following options; they are listed in order of increasing strength to get the job done.  They are all available over the counter, and you may need to use some, POSSIBLY even all of these options:    Drink plenty of fluids (prune juice may be helpful) and high fiber foods Colace 100 mg by mouth twice a day  Senokot for constipation as directed and as needed Dulcolax (bisacodyl), take with full glass of water  Miralax (polyethylene glycol) once or twice a day as needed.  If you have tried all these things and are unable to have a bowel movement in the first 3-4 days after surgery call either your surgeon or your  primary doctor.    If you experience loose stools or diarrhea, hold the medications until you stool forms back up.  If your symptoms do not get better within 1 week or if they get worse, check with your doctor.  If you experience "the worst abdominal pain ever" or develop nausea or vomiting, please contact the office immediately for further recommendations for treatment.   ITCHING:  If you experience itching with your medications, try taking only a single pain pill, or even half a pain pill at a time.  You can also use Benadryl over the counter for itching or also to help with sleep.   TED HOSE STOCKINGS:  Use stockings on both legs until for at least 2 weeks or as directed by physician office. They may be removed at night for sleeping.  MEDICATIONS:  See your medication summary on the After Visit Summary that nursing will review with you.  You may have some home medications which will be placed on hold until you complete the course of blood thinner medication.  It is important for you to complete the blood thinner medication as prescribed.  PRECAUTIONS:  If you experience chest pain or shortness of breath - call 911 immediately for transfer to the hospital emergency department.   If you develop a fever greater that 101 F, purulent drainage from wound, increased redness or drainage from wound, foul odor from the wound/dressing, or calf pain - CONTACT YOUR SURGEON.                                                   FOLLOW-UP APPOINTMENTS:  If you do not already have a post-op appointment, please call the office for an appointment to be seen by your surgeon.  Guidelines for how soon to be seen are listed in your After Visit Summary, but are typically between 1-4 weeks after surgery.  OTHER INSTRUCTIONS:   Knee Replacement:  Do not place pillow under knee, focus on keeping the knee straight while resting. CPM instructions: 0-90 degrees, 2 hours in the morning, 2 hours in the afternoon, and 2 hours  in the evening. Place foam block, curve side up under heel at all times except when in CPM or when walking.  DO NOT modify, tear, cut, or change the foam block in any way.  MAKE SURE YOU:   Understand these instructions.   Get help right away if you are not doing well or get worse.    Thank you for letting us be a part of your medical care team.  It is a privilege we respect greatly.  We hope these instructions will help you stay on track for a fast and full recovery!    Information on my medicine - ELIQUIS (apixaban)  This medication education was reviewed with me or my healthcare representative as part of my discharge preparation.  The pharmacist that spoke with me during my hospital stay was:  Sampson Si, [facility]  Why was Eliquis prescribed for you? Eliquis was prescribed for you to reduce the risk of blood clots forming after orthopedic surgery.    What do You need to know about Eliquis? Take your Eliquis TWICE DAILY - one tablet in the morning and one tablet in the evening with or without food.  It would be best to take the dose about the same time each day.  If you have difficulty swallowing the tablet whole please discuss with your pharmacist how to take the medication safely.  Take Eliquis exactly as prescribed by your doctor and DO NOT stop taking Eliquis without talking to the doctor who prescribed the medication.  Stopping without other medication to take the place of Eliquis may increase your risk of developing a clot.  After discharge, you should have regular check-up appointments with your healthcare provider that is prescribing your Eliquis.  What do you do if you miss a dose? If a dose of ELIQUIS is not taken at the scheduled time, take it as soon as possible on the same day and twice-daily administration should be resumed.  The dose should not be doubled to make up for a missed dose.  Do not take more  than one tablet of ELIQUIS at the same  time.  Important Safety Information A possible side effect of Eliquis is bleeding. You should call your healthcare provider right away if you experience any of the following: ? Bleeding from an injury or your nose that does not stop. ? Unusual colored urine (red or dark brown) or unusual colored stools (red or black). ? Unusual bruising for unknown reasons. ? A serious fall or if you hit your head (even if there is no bleeding).  Some medicines may interact with Eliquis and might increase your risk of bleeding or clotting while on Eliquis. To help avoid this, consult your healthcare provider or pharmacist prior to using any new prescription or non-prescription medications, including herbals, vitamins, non-steroidal anti-inflammatory drugs (NSAIDs) and supplements.  This website has more information on Eliquis (apixaban): http://www.eliquis.com/eliquis/home

## 2017-02-27 NOTE — Anesthesia Preprocedure Evaluation (Addendum)
Anesthesia Evaluation  Patient identified by MRN, date of birth, ID band Patient awake    Reviewed: Allergy & Precautions, NPO status , Patient's Chart, lab work & pertinent test results  History of Anesthesia Complications Negative for: history of anesthetic complications  Airway Mallampati: IV  TM Distance: >3 FB Neck ROM: Limited    Dental  (+) Missing, Dental Advisory Given, Partial Upper,    Pulmonary shortness of breath, sleep apnea , COPD,  COPD inhaler, former smoker,    Pulmonary exam normal        Cardiovascular hypertension, Pt. on medications Normal cardiovascular exam     Neuro/Psych PSYCHIATRIC DISORDERS Depression negative neurological ROS     GI/Hepatic hiatal hernia, GERD  Medicated and Controlled,  Endo/Other  diabetesMorbid obesity  Renal/GU      Musculoskeletal  (+) Arthritis ,   Abdominal (+) + obese,   Peds  Hematology   Anesthesia Other Findings   Reproductive/Obstetrics                            Anesthesia Physical Anesthesia Plan  ASA: III  Anesthesia Plan: MAC, Spinal and Regional   Post-op Pain Management:    Induction:   PONV Risk Score and Plan: 2 and Ondansetron and Treatment may vary due to age or medical condition  Airway Management Planned: Nasal Cannula  Additional Equipment: None  Intra-op Plan:   Post-operative Plan:   Informed Consent: I have reviewed the patients History and Physical, chart, labs and discussed the procedure including the risks, benefits and alternatives for the proposed anesthesia with the patient or authorized representative who has indicated his/her understanding and acceptance.   Dental advisory given  Plan Discussed with: CRNA and Surgeon  Anesthesia Plan Comments:         Anesthesia Quick Evaluation

## 2017-02-27 NOTE — Anesthesia Procedure Notes (Signed)
Anesthesia Regional Block: Adductor canal block   Pre-Anesthetic Checklist: ,, timeout performed, Correct Patient, Correct Site, Correct Laterality, Correct Procedure, Correct Position, site marked, Risks and benefits discussed,  Surgical consent,  Pre-op evaluation,  At surgeon's request and post-op pain management  Laterality: Lower and Left  Prep: chloraprep       Needles:  Injection technique: Single-shot  Needle Type: Echogenic Stimulator Needle          Additional Needles:   Procedures:,,,, ultrasound used (permanent image in chart),,,,  Narrative:  Start time: 02/27/2017 10:49 AM End time: 02/27/2017 10:53 AM Injection made incrementally with aspirations every 5 mL.  Performed by: Personally  Anesthesiologist: Val EagleMoser, Damel Querry, MD  Additional Notes: H+P and labs reviewed, risks and benefits discussed with patient, procedure tolerated well without complications

## 2017-02-27 NOTE — Anesthesia Procedure Notes (Signed)
Spinal  Patient location during procedure: OR Start time: 02/27/2017 7:54 AM End time: 02/27/2017 7:59 AM Staffing Anesthesiologist: Val EagleMoser, Yaeko Fazekas, MD Preanesthetic Checklist Completed: patient identified, surgical consent, pre-op evaluation, timeout performed, IV checked, risks and benefits discussed and monitors and equipment checked Spinal Block Patient position: sitting Prep: site prepped and draped and DuraPrep Patient monitoring: heart rate, cardiac monitor, continuous pulse ox and blood pressure Approach: midline Location: L4-5 Injection technique: single-shot Needle Needle type: Pencan  Needle gauge: 24 G Needle length: 10 cm Assessment Sensory level: T4

## 2017-02-27 NOTE — Progress Notes (Signed)
Pt had a temp. Of 100.6. Gave pt tylenol and instructed to do IS. Rechecked Pt's Temp. And it is now 98.5. Will cont. To monitor.

## 2017-02-27 NOTE — Progress Notes (Signed)
Orthopedic Tech Progress Note Patient Details:  Genia Delancy L Wieland 1950-12-19 [id number]  CPM Left Knee CPM Left Knee: On Left Knee Flexion (Degrees): 60 Left Knee Extension (Degrees): 0 Additional Comments: Left knee/leg   Alvina ChouWilliams, Chae Oommen C 02/27/2017, 11:19 AM

## 2017-02-27 NOTE — Addendum Note (Signed)
Addendum  created 02/27/17 2205 by Val EagleMoser, Desmen Schoffstall, MD   SmartForm saved

## 2017-02-27 NOTE — Evaluation (Signed)
Physical Therapy Evaluation Patient Details Name: Lynn Dixon L Bonfield MRN: [id number] DOB: 12-30-1950 Today's Date: 02/27/2017   History of Present Illness  Pt is a 66 y/o female s/p L TKA. PMH includes HTN, OSA on CPAP, R TKA, COPD, PTSD, glaucoma, s/p ACDF, and back surgery.   Clinical Impression  Pt is s/p surgery above with deficits below. PTA, pt was using rollator for ambulation. Upon eval, pt limited by post op pain and weakness, and also demonstrated some unsteadiness. Required verbal cues for maintenance of 50% WB on LLE. Requesting to limit mobility to chair, as pt was wanting to eat. Verbally reviewed LLE exercise with pt. Reports husband and children will be present to assist at d/c. Will need RW for d/c home and follow up recommendations per MD arrangements. Will continue to follow acutely to maximize functional mobility independence and safety.     Follow Up Recommendations DC plan and follow up therapy as arranged by surgeon;Supervision for mobility/OOB    Equipment Recommendations  Rolling walker with 5" wheels    Recommendations for Other Services       Precautions / Restrictions Precautions Precautions: Knee Precaution Booklet Issued: Yes (comment) Precaution Comments: Verbally reviewed supine knee ther ex with pt. Pt wanting to eat dinner so ther ex deferred.  Required Braces or Orthoses: Knee Immobilizer - Left Knee Immobilizer - Left: Other (comment)(until discontinued. ) Restrictions Weight Bearing Restrictions: Yes LLE Weight Bearing: Partial weight bearing LLE Partial Weight Bearing Percentage or Pounds: 50      Mobility  Bed Mobility Overal bed mobility: Needs Assistance Bed Mobility: Supine to Sit     Supine to sit: Supervision     General bed mobility comments: Supervision for safety. Use of bed rails and elevated HOB.   Transfers Overall transfer level: Needs assistance Equipment used: Rolling walker (2 wheeled) Transfers: Sit to/from Stand Sit to  Stand: Min guard         General transfer comment: Min guard for steadying assist. Verbal cues for safe hand placement.   Ambulation/Gait Ambulation/Gait assistance: Min guard Ambulation Distance (Feet): 5 Feet Assistive device: Rolling walker (2 wheeled) Gait Pattern/deviations: Step-to pattern;Decreased step length - right;Decreased step length - left;Decreased weight shift to left;Antalgic Gait velocity: Decreased Gait velocity interpretation: Below normal speed for age/gender General Gait Details: Slow, antalgic gait. Some unsteadiness noted and required min guard assist. Verbal cues for sequencing using RW. Dinner arrived during session, so pt requesting to limit distance to chair. Verbal cues to press through UEs to maintain 50% WB.   Stairs            Wheelchair Mobility    Modified Rankin (Stroke Patients Only)       Balance Overall balance assessment: Needs assistance Sitting-balance support: No upper extremity supported;Feet supported Sitting balance-Leahy Scale: Good     Standing balance support: Bilateral upper extremity supported;During functional activity Standing balance-Leahy Scale: Poor Standing balance comment: Reliant on BUE support.                              Pertinent Vitals/Pain Pain Assessment: Faces Faces Pain Scale: Hurts little more Pain Location: L knee  Pain Descriptors / Indicators: Aching;Operative site guarding Pain Intervention(s): Limited activity within patient's tolerance;Monitored during session;Repositioned    Home Living Family/patient expects to be discharged to:: Private residence Living Arrangements: Spouse/significant other Available Help at Discharge: Family;Available 24 hours/day Type of Home: House Home Access: Stairs to enter Entrance  Stairs-Rails: None Entrance Stairs-Number of Steps: 1 Home Layout: One level Home Equipment: Walker - 4 wheels;Bedside commode      Prior Function Level of  Independence: Independent with assistive device(s)         Comments: Used rollator at baseline     Hand Dominance        Extremity/Trunk Assessment   Upper Extremity Assessment Upper Extremity Assessment: Defer to OT evaluation    Lower Extremity Assessment Lower Extremity Assessment: LLE deficits/detail LLE Deficits / Details: Sensory in tact. Deficits consistent with post op pain and weakness.     Cervical / Trunk Assessment Cervical / Trunk Assessment: Normal  Communication   Communication: No difficulties  Cognition Arousal/Alertness: Awake/alert Behavior During Therapy: WFL for tasks assessed/performed Overall Cognitive Status: Within Functional Limits for tasks assessed                                        General Comments      Exercises     Assessment/Plan    PT Assessment Patient needs continued PT services  PT Problem List Decreased strength;Decreased range of motion;Decreased balance;Decreased mobility;Decreased knowledge of use of DME;Decreased knowledge of precautions;Pain       PT Treatment Interventions DME instruction;Gait training;Stair training;Functional mobility training;Therapeutic activities;Therapeutic exercise;Balance training;Cognitive remediation    PT Goals (Current goals can be found in the Care Plan section)  Acute Rehab PT Goals Patient Stated Goal: to go home  PT Goal Formulation: With patient Time For Goal Achievement: 03/06/17 Potential to Achieve Goals: Good    Frequency 7X/week   Barriers to discharge        Co-evaluation               AM-PAC PT "6 Clicks" Daily Activity  Outcome Measure Difficulty turning over in bed (including adjusting bedclothes, sheets and blankets)?: None Difficulty moving from lying on back to sitting on the side of the bed? : A Little Difficulty sitting down on and standing up from a chair with arms (e.g., wheelchair, bedside commode, etc,.)?: Unable Help needed  moving to and from a bed to chair (including a wheelchair)?: A Little Help needed walking in hospital room?: A Little Help needed climbing 3-5 steps with a railing? : A Lot 6 Click Score: 16    End of Session Equipment Utilized During Treatment: Gait belt;Left knee immobilizer Activity Tolerance: Patient tolerated treatment well Patient left: in chair;with call bell/phone within reach Nurse Communication: Mobility status PT Visit Diagnosis: Other abnormalities of gait and mobility (R26.89);Pain Pain - Right/Left: Left Pain - part of body: Knee    Time: [id number] PT Time Calculation (min) (ACUTE ONLY): 25 min   Charges:   PT Evaluation $PT Eval Low Complexity: 1 Low PT Treatments $Gait Training: 8-22 mins   PT G Codes:        Gladys DammeBrittany Marvion Bastidas, PT, DPT  Acute Rehabilitation Services  Pager: [phone]   Lehman PromBrittany S Aydia Maj 02/27/2017, 5:06 PM

## 2017-02-27 NOTE — Op Note (Signed)
NAMMargarito Dixon:  Lynn Dixon, Lynn Dixon                  ACCOUNT NO.:  [id number]  MEDICAL RECORD NO.:  [id number]  LOCATION:  [facility]                         FACILITY:  [facility]  PHYSICIAN:  Dyke BrackettW. D. Hussain Maimone, M.D.    DATE OF BIRTH:  05/22/1950  DATE OF PROCEDURE:  02/27/2017 DATE OF DISCHARGE:                              OPERATIVE REPORT   PREOPERATIVE DIAGNOSIS:  Osteoarthritis, left knee.  POSTOPERATIVE DIAGNOSIS:  Osteoarthritis, left knee.  OPERATION: 1. Left total knee replacement (size 3 femur, 2.5 tibia, size 3, 10 mm     polyethylene bearing with 35 mm all poly patella). 2. Supplemental fixation of medial, lateral femoral condyle fractures.  SURGEON:  Dyke BrackettW. D. Dael Howland, M.D.  ANESTHESIA:  Spinal.  TOURNIQUET TIME:  Approximately 90 minutes.  DESCRIPTION OF PROCEDURE:  Spinal anesthetic, exsanguination of leg, inflation of a tourniquet to 350, straight skin incision made with medial parapatellar approach to the knee made.  Significant flexion contracture of about 15-20 degrees was noted with varus deformity.  Cut 11 mm 5-degree valgus cut on the femur, followed by cutting about 3-4 mm below the most diseased medial compartment.  Extension gap measured at 10 mm.  Sized the femur to be a size 3 with appropriate degree of external rotation, placed all-in-1 cutting block and cut the femur, followed by sizing the tibia to be 2.5 and keel hole was cut for the tibia.  Extension gap again was measured at 10, as well as the flexion gap.  PCL was completely released.  Stripping of the posterior capsule was done as well.  Patella was cut leaving about 15 mm of native patella for 35 mm all poly trial.  Trials were placed in anatomic position, deemed to be acceptable, flared the cement on the back table with antibiotic impregnated cement due to the patient's history of diabetes. Placed the tibia without difficulty.  Femur was placed and with some difficulty collapse into the femur, and on inspection, after  minimal impaction noted, the patient had significant osteoporosis and had a crack on both sides of the condyle relative to a crack on the medial and lateral condyles without any significant displacement.  Due to this fact, we did not try to place the femur at the same time as the tibia. We assessed the issue, decided to place 2 cancellous screws on the medial side through the smaller crack on the condyle, followed by 2 threaded K-wires, which coapted the bone nicely on the lateral side. Two cancellous screws were placed in oblique angle with good purchase from the lateral side to the native femoral metaphysis.  Thorough inspection and stress of these areas revealed that these were anatomically fixed.  We then placed the femur with the cement in a doughy state, allowed to harden as well as the patella, reassessed the stability, deemed to be excellent, we checked the position of the prosthesis visually.  Prosthesis seated well, apposed to with not full extension, good stability was noted.  We had infiltrated the knee with a mixture of Exparel and Marcaine.  Tourniquet was released.  Small bleeders were coagulated.  Small bits of cement were removed as well.  Final bearing was placed  and closure was affected with #1 Ethibond, 0 Vicryl, 2-0 Vicryl, and staples.  Lightly compressive sterile dressing and knee immobilizer applied, taken to recovery room in stable condition.     Dyke BrackettW. D. Kristien Salatino, M.D.     WDC/MEDQ  D:  02/27/2017  T:  02/27/2017  Job:  [id number]

## 2017-02-28 LAB — BASIC METABOLIC PANEL
Anion gap: 8 (ref 5–15)
BUN: 10 mg/dL (ref 6–20)
CHLORIDE: 104 mmol/L (ref 101–111)
CO2: 24 mmol/L (ref 22–32)
Calcium: 8.7 mg/dL — ABNORMAL LOW (ref 8.9–10.3)
Creatinine, Ser: 0.85 mg/dL (ref 0.44–1.00)
GFR calc non Af Amer: >60 mL/min (ref >60)
Glucose, Bld: 129 mg/dL — ABNORMAL HIGH (ref 65–99)
POTASSIUM: 4.3 mmol/L (ref 3.5–5.1)
SODIUM: 136 mmol/L (ref 135–145)

## 2017-02-28 LAB — CBC
HEMATOCRIT: 32.4 % — AB (ref 36.0–46.0)
HEMOGLOBIN: 10.6 g/dL — AB (ref 12.0–15.0)
MCH: 28.6 pg (ref 26.0–34.0)
MCHC: 32.7 g/dL (ref 30.0–36.0)
MCV: 87.3 fL (ref 78.0–100.0)
Platelets: 130 10*3/uL — ABNORMAL LOW (ref 150–400)
RBC: 3.71 MIL/uL — AB (ref 3.87–5.11)
RDW: 13.7 % (ref 11.5–15.5)
WBC: 8.9 10*3/uL (ref 4.0–10.5)

## 2017-02-28 LAB — GLUCOSE, CAPILLARY
GLUCOSE-CAPILLARY: 122 mg/dL — AB (ref 65–99)
Glucose-Capillary: 165 mg/dL — ABNORMAL HIGH (ref 65–99)
Glucose-Capillary: 126 mg/dL — ABNORMAL HIGH (ref 65–99)

## 2017-02-28 NOTE — Progress Notes (Signed)
Physical Therapy Treatment Patient Details Name: Lynn Dixon MRN: [id number] DOB: Aug 11, 1950 Today's Date: 02/28/2017    History of Present Illness Pt is a 66 y/o female s/p L TKA. PMH includes HTN, OSA on CPAP, R TKA, COPD, PTSD, glaucoma, s/p ACDF, and back surgery.     PT Comments    Pt is going to be slow to progress.  It was quite effortful for her to walk to the bathroom and around her room during our session today.  I do not think she will be ready to go home tomorrow and especially since she doesn't have 24/7 care until Monday and there will be periods of time if she d/c over the weekend where she will be getting up by herself.  O2 sats were 87% on RA during mobility, so I re-applied 2 L O2 [place] to her nose and she was still only able to maintain sats at 90-91% on 2 L at rest.  CPM applied at the end of our session.  PT will continue to follow acutely.    Follow Up Recommendations  DC plan and follow up therapy as arranged by surgeon;Supervision for mobility/OOB     Equipment Recommendations  Rolling walker with 5" wheels    Recommendations for Other Services   NA     Precautions / Restrictions Precautions Precautions: Knee Precaution Booklet Issued: Yes (comment) Required Braces or Orthoses: Knee Immobilizer - Left Knee Immobilizer - Left: Other (comment)(until discontinued) Restrictions LLE Weight Bearing: Partial weight bearing LLE Partial Weight Bearing Percentage or Pounds: 50    Mobility  Bed Mobility Overal bed mobility: Needs Assistance Bed Mobility: Sit to Supine       Sit to supine: Min assist   General bed mobility comments: Min assist to help progress legs back into the bed.    Transfers Overall transfer level: Needs assistance Equipment used: Rolling walker (2 wheeled) Transfers: Sit to/from Stand Sit to Stand: Min assist         General transfer comment: Min assist from low recliner chair and higher toliet.  Verbal cues for safe hand placement  during transitions.   Ambulation/Gait Ambulation/Gait assistance: Min assist Ambulation Distance (Feet): 20 Feet Assistive device: Rolling walker (2 wheeled) Gait Pattern/deviations: Step-to pattern;Antalgic;Trunk flexed Gait velocity: decreased Gait velocity interpretation: Below normal speed for age/gender General Gait Details: Pt with moderately antalgic gait pattern that improved with increased distance.  However, her trunk became progressively more flexed as she fatigued and she stopped several times to lean over on her forearms on the walker.            Balance Overall balance assessment: Needs assistance Sitting-balance support: Feet supported;No upper extremity supported Sitting balance-Leahy Scale: Good     Standing balance support: Bilateral upper extremity supported Standing balance-Leahy Scale: Poor                              Cognition Arousal/Alertness: Awake/alert Behavior During Therapy: WFL for tasks assessed/performed Overall Cognitive Status: Within Functional Limits for tasks assessed                                        Exercises Total Joint Exercises Ankle Circles/Pumps: AROM;AAROM;Both;20 reps Quad Sets: AROM;Left;Right;10 reps Towel Squeeze: AROM;Both;10 reps Heel Slides: AAROM;Left;10 reps        Pertinent Vitals/Pain Pain Assessment: 0-10 Pain  Score: 10-Worst pain ever Pain Location: left knee Pain Descriptors / Indicators: Aching;Operative site guarding Pain Intervention(s): Limited activity within patient's tolerance;Monitored during session;Repositioned;Ice applied           PT Goals (current goals can now be found in the care plan section) Acute Rehab PT Goals Patient Stated Goal: to go home  Progress towards PT goals: Progressing toward goals    Frequency    7X/week      PT Plan Current plan remains appropriate       AM-PAC PT "6 Clicks" Daily Activity  Outcome Measure  Difficulty  turning over in bed (including adjusting bedclothes, sheets and blankets)?: None Difficulty moving from lying on back to sitting on the side of the bed? : Unable Difficulty sitting down on and standing up from a chair with arms (e.g., wheelchair, bedside commode, etc,.)?: Unable Help needed moving to and from a bed to chair (including a wheelchair)?: A Little Help needed walking in hospital room?: A Little Help needed climbing 3-5 steps with a railing? : A Lot 6 Click Score: 14    End of Session Equipment Utilized During Treatment: Gait belt;Left knee immobilizer Activity Tolerance: Patient tolerated treatment well Patient left: in chair;with call bell/phone within reach Nurse Communication: Mobility status PT Visit Diagnosis: Other abnormalities of gait and mobility (R26.89);Pain Pain - Right/Left: Left Pain - part of body: Knee     Time: 1610-96041134-1230 PT Time Calculation (min) (ACUTE ONLY): 56 min  Charges:  $Gait Training: 23-37 mins $Therapeutic Exercise: 8-22 mins $Therapeutic Activity: 8-22 mins          Enaya Howze B. Juno Alers, PT, DPT [phone]            02/28/2017, 12:35 PM

## 2017-02-28 NOTE — Plan of Care (Signed)
  Nutrition: Adequate nutrition will be maintained 02/28/2017 1346 - Progressing by William Daltonarpenter, Vieva Brummitt L, RN   Pain Management: Pain level will decrease with appropriate interventions 02/28/2017 1346 - Progressing by William Daltonarpenter, Shaylene Paganelli L, RN

## 2017-02-28 NOTE — Progress Notes (Signed)
Physical Therapy Treatment Patient Details Name: Lynn Dixon MRN: [id number] DOB: 1951-03-05 Today's Date: 02/28/2017    History of Present Illness Pt is a 66 y/o female s/p L TKA. PMH includes HTN, OSA on CPAP, R TKA, COPD, PTSD, glaucoma, s/p ACDF, and back surgery.     PT Comments    Pt is progressing with her mobility.  She was able to get out into the hallway with her RW this PM, however, she had to take 3 seated rest breaks walking only 65' total. Her O2 sats are still 87% on RA during gait with pt reporting chest congestion.  I had her demonstrate 10 puffs on her incentive spirometer and told her to do them 10 times an hour.  Pt left on 2 L O2 [place] at end of session.  PT will continue to follow.    Follow Up Recommendations  DC plan and follow up therapy as arranged by surgeon;Supervision for mobility/OOB     Equipment Recommendations  Rolling walker with 5" wheels    Recommendations for Other Services   NA     Precautions / Restrictions Precautions Precautions: Knee Precaution Booklet Issued: Yes (comment) Required Braces or Orthoses: Knee Immobilizer - Left Knee Immobilizer - Left: Other (comment)(until discontinued) Restrictions LLE Weight Bearing: Partial weight bearing LLE Partial Weight Bearing Percentage or Pounds: 50    Mobility  Bed Mobility Overal bed mobility: Needs Assistance Bed Mobility: Supine to Sit     Supine to sit: Supervision;HOB elevated Sit to supine: Min assist   General bed mobility comments: Supervision for safety, pt relying heavily on UE pulling on bed rail and HOB elevated to ~40 degrees.   Transfers Overall transfer level: Needs assistance Equipment used: Rolling walker (2 wheeled) Transfers: Sit to/from Stand Sit to Stand: Min assist         General transfer comment: Min assist multiple times from low recliner chair. Verbal cues for safe hand placement.  Pt using momentum to get to her feet.   Ambulation/Gait Ambulation/Gait  assistance: Min assist Ambulation Distance (Feet): 65 Feet(with three seated rest breaks) Assistive device: Rolling walker (2 wheeled) Gait Pattern/deviations: Step-to pattern;Antalgic;Trunk flexed Gait velocity: decreased Gait velocity interpretation: Below normal speed for age/gender General Gait Details: Pt continues to have moderately antalgic gait pattern, verbal cues for safe RW use and we pulled the recliner chair behind her to encourage increased gait distance and safety.  She took three seated rest breaks during gait.  O2 sats remain low on RA and pt reporting conjestion.  O2 sats were lowest 87%.  2 L O2 [place] re-applied at the end of the session.           Balance Overall balance assessment: Needs assistance Sitting-balance support: Feet supported;No upper extremity supported Sitting balance-Leahy Scale: Good     Standing balance support: Bilateral upper extremity supported Standing balance-Leahy Scale: Poor                              Cognition Arousal/Alertness: Awake/alert Behavior During Therapy: WFL for tasks assessed/performed Overall Cognitive Status: Within Functional Limits for tasks assessed                                        Exercises Total Joint Exercises Ankle Circles/Pumps: AROM;AAROM;Both;20 reps Quad Sets: AROM;Left;Right;10 reps Towel Squeeze: AROM;Both;10 reps Short Arc Quad:  AAROM;Left;10 reps Heel Slides: AAROM;Left;5 reps Hip ABduction/ADduction: AROM;Left;10 reps Goniometric ROM: 20-65    General Comments General comments (skin integrity, edema, etc.): Attempted to put pt in bone foam at end of session and she could only tolerate it for a few mins and have to take it off.        Pertinent Vitals/Pain Pain Assessment: 0-10 Pain Score: 6  Pain Location: left knee Pain Descriptors / Indicators: Aching;Operative site guarding Pain Intervention(s): Limited activity within patient's tolerance;Monitored during  session;Repositioned;Ice applied           PT Goals (current goals can now be found in the care plan section) Acute Rehab PT Goals Patient Stated Goal: to go home  Progress towards PT goals: Progressing toward goals    Frequency    7X/week      PT Plan Current plan remains appropriate       AM-PAC PT "6 Clicks" Daily Activity  Outcome Measure  Difficulty turning over in bed (including adjusting bedclothes, sheets and blankets)?: None Difficulty moving from lying on back to sitting on the side of the bed? : Unable Difficulty sitting down on and standing up from a chair with arms (e.g., wheelchair, bedside commode, etc,.)?: Unable Help needed moving to and from a bed to chair (including a wheelchair)?: A Little Help needed walking in hospital room?: A Little Help needed climbing 3-5 steps with a railing? : A Lot 6 Click Score: 14    End of Session Equipment Utilized During Treatment: Gait belt;Left knee immobilizer Activity Tolerance: Patient tolerated treatment well Patient left: in chair;with call bell/phone within reach Nurse Communication: Mobility status PT Visit Diagnosis: Other abnormalities of gait and mobility (R26.89);Pain Pain - Right/Left: Left Pain - part of body: Knee     Time: [id number] PT Time Calculation (min) (ACUTE ONLY): 45 min  Charges:  $Gait Training: 23-37 mins $Therapeutic Exercise: 8-22 mins $Therapeutic Activity: 8-22 mins          [name] Payer, PT, DPT [phone]            02/28/2017, 3:37 PM

## 2017-02-28 NOTE — Evaluation (Signed)
Occupational Therapy Evaluation Patient Details Name: Genia Delancy L Heiny MRN: [id number] DOB: September 28, 1950 Today's Date: 02/28/2017    History of Present Illness Pt is a 66 y/o female s/p L TKA. PMH includes HTN, OSA on CPAP, R TKA, COPD, PTSD, glaucoma, s/p ACDF, and back surgery.    Clinical Impression   PTA Pt independent with ADL and mobility with rollator. Pt very motivated this session by previous PT sessions. Pt is currently min guard for transfers and max A for LB ADL. Pt will benefit from skilled OT in the acute setting to maximize safety and independence in ADL and functional transfers. Next session should focus on AE for LB bathing and dressing and continue education for compensatory strategies.    Follow Up Recommendations  No OT follow up    Equipment Recommendations  Tub/shower seat    Recommendations for Other Services       Precautions / Restrictions Precautions Precautions: Knee Precaution Booklet Issued: Yes (comment) Required Braces or Orthoses: Knee Immobilizer - Left Knee Immobilizer - Left: Other (comment)(until discontinued) Restrictions Weight Bearing Restrictions: Yes LLE Weight Bearing: Partial weight bearing LLE Partial Weight Bearing Percentage or Pounds: 50      Mobility Bed Mobility Overal bed mobility: Needs Assistance Bed Mobility: Sit to Supine     Supine to sit: Supervision;HOB elevated Sit to supine: Supervision   General bed mobility comments: Supervision for safety  Transfers Overall transfer level: Needs assistance Equipment used: Rolling walker (2 wheeled) Transfers: Sit to/from Stand Sit to Stand: Min assist         General transfer comment: min A for steadying to power up from recliner, vc for safe hand placement, Pt uses momentum to get to feet    Balance Overall balance assessment: Needs assistance Sitting-balance support: Feet supported;No upper extremity supported Sitting balance-Leahy Scale: Good     Standing balance  support: Bilateral upper extremity supported Standing balance-Leahy Scale: Poor Standing balance comment: Reliant on BUE support.                            ADL either performed or assessed with clinical judgement   ADL Overall ADL's : Needs assistance/impaired Eating/Feeding: Modified independent   Grooming: Set up;Sitting   Upper Body Bathing: Minimal assistance   Lower Body Bathing: Maximal assistance   Upper Body Dressing : Set up   Lower Body Dressing: Maximal assistance;With caregiver independent assisting;Sit to/from stand Lower Body Dressing Details (indicate cue type and reason): educated to dress operated leg first Toilet Transfer: Min guard;Ambulation;RW Toilet Transfer Details (indicate cue type and reason): short ambulation Toileting- Clothing Manipulation and Hygiene: Moderate assistance;Sit to/from stand       Functional mobility during ADLs: Min guard;Rolling walker       Vision Baseline Vision/History: Wears glasses Wears Glasses: Reading only Patient Visual Report: No change from baseline       Perception     Praxis      Pertinent Vitals/Pain Pain Assessment: Faces Pain Score: 6  Faces Pain Scale: Hurts a little bit Pain Location: left knee Pain Descriptors / Indicators: Aching;Operative site guarding Pain Intervention(s): Monitored during session;Repositioned     Hand Dominance Right   Extremity/Trunk Assessment Upper Extremity Assessment Upper Extremity Assessment: Overall WFL for tasks assessed   Lower Extremity Assessment Lower Extremity Assessment: LLE deficits/detail LLE Deficits / Details: Sensory in tact. Deficits consistent with post op pain and weakness.    Cervical / Trunk Assessment Cervical /  Trunk Assessment: Normal   Communication Communication Communication: No difficulties   Cognition Arousal/Alertness: Awake/alert Behavior During Therapy: WFL for tasks assessed/performed Overall Cognitive Status: Within  Functional Limits for tasks assessed                                     General Comments  Pt in CPM at the end of session    Exercises Exercises: Total Joint Total Joint Exercises Short Arc Quad: AAROM;Left;10 reps Heel Slides: AAROM;Left;5 reps Hip ABduction/ADduction: AROM;Left;10 reps Goniometric ROM: 20-65   Shoulder Instructions      Home Living Family/patient expects to be discharged to:: Private residence Living Arrangements: Spouse/significant other Available Help at Discharge: Family;Available 24 hours/day Type of Home: House Home Access: Stairs to enter [employer] of Steps: 1 Entrance Stairs-Rails: None Home Layout: One level     Bathroom Shower/Tub: Chief [employer]: Standard Bathroom Accessibility: Yes How Accessible: Accessible via walker Home Equipment: Walker - 4 wheels;Bedside commode          Prior Functioning/Environment Level of Independence: Independent with assistive device(s)        Comments: Used rollator at baseline        OT Problem List: Decreased range of motion;Decreased activity tolerance;Impaired balance (sitting and/or standing);Decreased knowledge of precautions;Obesity;Pain      OT Treatment/Interventions: Self-care/ADL training;DME and/or AE instruction;Therapeutic activities;Patient/family education;Balance training    OT Goals(Current goals can be found in the care plan section) Acute Rehab OT Goals Patient Stated Goal: to go home  OT Goal Formulation: With patient Time For Goal Achievement: 03/13/17 Potential to Achieve Goals: Good ADL Goals Pt Will Perform Lower Body Bathing: with modified independence;with adaptive equipment;sit to/from stand Pt Will Perform Lower Body Dressing: with min assist;with caregiver independent in assisting;sit to/from stand Pt Will Transfer to Toilet: with modified independence;ambulating Pt Will Perform Toileting - Clothing Manipulation and  hygiene: with modified independence;sit to/from stand Pt Will Perform Tub/Shower Transfer: with min guard assist;with caregiver independent in assisting;Tub transfer;3 in 1;rolling walker  OT Frequency: Min 2X/week   Barriers to D/C:            Co-evaluation              AM-PAC PT "6 Clicks" Daily Activity     Outcome Measure Help from another person eating meals?: None Help from another person taking care of personal grooming?: A Little Help from another person toileting, which includes using toliet, bedpan, or urinal?: A Little Help from another person bathing (including washing, rinsing, drying)?: A Lot Help from another person to put on and taking off regular upper body clothing?: None Help from another person to put on and taking off regular lower body clothing?: A Lot 6 Click Score: 18   End of Session CPM Left Knee CPM Left Knee: On Left Knee Flexion (Degrees): 70 Left Knee Extension (Degrees): 0  Activity Tolerance:   Patient left:    OT Visit Diagnosis: Unsteadiness on feet (R26.81);Other abnormalities of gait and mobility (R26.89);Pain Pain - Right/Left: Left Pain - part of body: Knee                Time: 1610-9604 OT Time Calculation (min): 28 min Charges:  OT General Charges $OT Visit: 1 Visit OT Evaluation $OT Eval Moderate Complexity: 1 Mod OT Treatments $Self Care/Home Management : 8-22 mins G-Codes:     Sherryl Manges OTR/L [phone]  Evern BioLaura J Drelyn Pistilli 02/28/2017, 5:06 PM

## 2017-02-28 NOTE — Progress Notes (Signed)
SPORTS MEDICINE AND JOINT REPLACEMENT  Georgena SpurlingStephen Lucey, MD    Laurier Nancyolby Lakeya Mulka, PA-C [address], [place], [place]  [address]                             [phone]   PROGRESS NOTE  Subjective:  negative for Chest Pain  negative for Shortness of Breath  negative for Nausea/Vomiting   negative for Calf Pain  negative for Bowel Movement   Tolerating Diet: yes         Patient reports pain as 5 on 0-10 scale.    Objective: Vital signs in last 24 hours:    Patient Vitals for the past 24 hrs:  BP Temp Temp src Pulse Resp SpO2  02/28/17 0608 (!) 113/52 99 F (37.2 C) Oral 76 - 91 %  02/27/17 2200 - 98.5 F (36.9 C) Oral - - -  02/27/17 2118 (!) 100/53 - - - - -  02/27/17 2116 (!) 79/44 (!) 100.6 F (38.1 C) Oral 85 - 92 %  02/27/17 1500 (!) 79/60 98.1 F (36.7 C) Oral 68 12 94 %  02/27/17 1242 (!) 113/55 97.8 F (36.6 C) Oral 60 12 100 %  02/27/17 1220 112/67 97.7 F (36.5 C) - 61 15 93 %  02/27/17 1205 94/71 - - 61 13 95 %  02/27/17 1150 109/68 - - 60 15 94 %  02/27/17 1145 - - - (!) 56 11 96 %  02/27/17 1140 113/63 - - 64 13 97 %  02/27/17 1130 - - - 66 12 94 %  02/27/17 1121 111/67 - - 70 14 98 %  02/27/17 1120 - - - 69 13 94 %  02/27/17 1115 - - - 65 13 97 %  02/27/17 1110 - - - 67 13 97 %  02/27/17 1107 114/65 - - 66 13 97 %  02/27/17 1100 - - - 71 18 98 %  02/27/17 1051 (!) 89/54 - - 73 14 100 %  02/27/17 1050 - - - 77 15 100 %  02/27/17 1045 - - - 71 18 98 %  02/27/17 1040 - - - 78 15 100 %  02/27/17 1037 (!) 95/51 - - 73 12 100 %  02/27/17 1036 - 97.7 F (36.5 C) - - - -    @flow {1959:LAST@   Intake/Output from previous day:   11/16 0701 - 11/17 0700 In: 1386.3 [P.O.:120; I.V.:1056.3] Out: 1200 [Urine:1100]   Intake/Output this shift:   No intake/output data recorded.   Intake/Output      11/16 0701 - 11/17 0700 11/17 0701 - 11/18 0700   P.O. 120    I.V. (mL/kg) 1056.3 (11.3)    Other 100    IV Piggyback 110    Total Intake(mL/kg) 1386.3  (14.8)    Urine (mL/kg/hr) 1100 (0.5)    Blood 100    Total Output 1200    Net +186.3         Urine Occurrence 1 x       LABORATORY DATA: Recent Labs    02/27/17 0708 02/28/17 0445  WBC  --  8.9  HGB  --  10.6*  HCT  --  32.4*  PLT 134* 130*   Recent Labs    02/28/17 0445  NA 136  K 4.3  CL 104  CO2 24  BUN 10  CREATININE 0.85  GLUCOSE 129*  CALCIUM 8.7*   Lab Results  Component Value Date  INR 0.98 02/16/2017    Examination:  General appearance: alert, cooperative and no distress Extremities: extremities normal, atraumatic, no cyanosis or edema  Wound Exam: clean, dry, intact   Drainage:  None: wound tissue dry  Motor Exam: Quadriceps and Hamstrings Intact  Sensory Exam: Superficial Peroneal, Deep Peroneal and Tibial normal   Assessment:    1 Day Post-Op  Procedure(s) (LRB): LEFT TOTAL KNEE ARTHROPLASTY (Left)  ADDITIONAL DIAGNOSIS:  Active Problems:   Primary localized osteoarthritis of left knee     Plan: Physical Therapy as ordered Weight Bearing as Tolerated (WBAT)  DVT Prophylaxis:  eliquis  DISCHARGE PLAN: Home  DISCHARGE NEEDS: HHPT   Patient is doing well, will monitor progress in PT. Probable D/C home tomorrow         Guy SandiferColby Alan Saint Hank 02/28/2017, 8:29 AM

## 2017-02-28 NOTE — Progress Notes (Signed)
Orthopedic Tech Progress Note Patient Details:  Lynn Dixon 09-09-1950 [id number]  CPM Left Knee CPM Left Knee: On Left Knee Flexion (Degrees): 70 Left Knee Extension (Degrees): 0 Additional Comments: bone foam provided at bedside.    Lynn Dixon 02/28/2017, 4:18 PM

## 2017-03-01 LAB — CBC
HCT: 30 % — ABNORMAL LOW (ref 36.0–46.0)
Hemoglobin: 9.9 g/dL — ABNORMAL LOW (ref 12.0–15.0)
MCH: 28.8 pg (ref 26.0–34.0)
MCHC: 33 g/dL (ref 30.0–36.0)
MCV: 87.2 fL (ref 78.0–100.0)
PLATELETS: 129 10*3/uL — AB (ref 150–400)
RBC: 3.44 MIL/uL — AB (ref 3.87–5.11)
RDW: 13.7 % (ref 11.5–15.5)
WBC: 8.8 10*3/uL (ref 4.0–10.5)

## 2017-03-01 LAB — GLUCOSE, CAPILLARY
GLUCOSE-CAPILLARY: 125 mg/dL — AB (ref 65–99)
Glucose-Capillary: 110 mg/dL — ABNORMAL HIGH (ref 65–99)
Glucose-Capillary: 108 mg/dL — ABNORMAL HIGH (ref 65–99)
Glucose-Capillary: 139 mg/dL — ABNORMAL HIGH (ref 65–99)
Glucose-Capillary: 133 mg/dL — ABNORMAL HIGH (ref 65–99)

## 2017-03-01 NOTE — Progress Notes (Signed)
SPORTS MEDICINE AND JOINT REPLACEMENT  Lynn SpurlingStephen Lucey, MD    Lynn Nancyolby Bayleigh Loflin, PA-C [address], [place], [place]  [address]                             [phone]   PROGRESS NOTE  Subjective:  negative for Chest Pain  negative for Shortness of Breath  negative for Nausea/Vomiting   negative for Calf Pain  negative for Bowel Movement   Tolerating Diet: yes         Patient reports pain as 5 on 0-10 scale.    Objective: Vital signs in last 24 hours:    Patient Vitals for the past 24 hrs:  BP Temp Temp src Pulse Resp SpO2  03/01/17 0328 110/60 98.6 F (37 C) Oral 75 18 94 %  03/01/17 0200 - - - - 18 -  02/28/17 2132 115/60 - - 82 - 93 %  02/28/17 2106 - 99.3 F (37.4 C) - - - 93 %  02/28/17 1706 (!) 93/55 98.9 F (37.2 C) Oral 81 - 90 %  02/28/17 1035 (!) 102/56 - - 77 20 91 %  02/28/17 1032 - - - - - (!) 85 %    @flow {1959:LAST@   Intake/Output from previous day:   11/17 0701 - 11/18 0700 In: 1285 [P.O.:760; I.V.:525] Out: 820 [Urine:820]   Intake/Output this shift:   11/17 1901 - 11/18 0700 In: 520 [P.O.:520] Out: 120 [Urine:120]   Intake/Output      11/17 0701 - 11/18 0700   P.O. 760   I.V. (mL/kg) 525 (5.6)   Total Intake(mL/kg) 1285 (13.8)   Urine (mL/kg/hr) 820 (0.4)   Total Output 820   Net +465       Urine Occurrence 3 x      LABORATORY DATA: Recent Labs    02/27/17 0708 02/28/17 0445 03/01/17 0416  WBC  --  8.9 8.8  HGB  --  10.6* 9.9*  HCT  --  32.4* 30.0*  PLT 134* 130* 129*   Recent Labs    02/28/17 0445  NA 136  K 4.3  CL 104  CO2 24  BUN 10  CREATININE 0.85  GLUCOSE 129*  CALCIUM 8.7*   Lab Results  Component Value Date   INR 0.98 02/16/2017    Examination:  General appearance: alert, cooperative and no distress Extremities: extremities normal, atraumatic, no cyanosis or edema  Wound Exam: clean, dry, intact   Drainage:  None: wound tissue dry  Motor Exam: Quadriceps and Hamstrings Intact  Sensory Exam:  Superficial Peroneal, Deep Peroneal and Tibial normal   Assessment:    2 Days Post-Op  Procedure(s) (LRB): LEFT TOTAL KNEE ARTHROPLASTY (Left)  ADDITIONAL DIAGNOSIS:  Active Problems:   Primary localized osteoarthritis of left knee     Plan: Physical Therapy as ordered Weight Bearing as Tolerated (WBAT)  DVT Prophylaxis:  eliquis  DISCHARGE PLAN: Home  DISCHARGE NEEDS: HHPT   Patient progressing but still struggling with pain and mobility. Also not fully voiding and received an I/O cath during the day yesterday. Will continue to follow through the day today. Probably D/C tomorrow         Guy SandiferColby Alan Klyde Dixon 03/01/2017, 7:00 AM

## 2017-03-01 NOTE — Progress Notes (Signed)
Physical Therapy Treatment Patient Details Name: Lynn Dixon L Daughtridge MRN: [id number] DOB: 02/11/1951 Today's Date: 03/01/2017    History of Present Illness Pt is a 66 y/o female s/p L TKA. PMH includes HTN, OSA on CPAP, R TKA, COPD, PTSD, glaucoma, s/p ACDF, and back surgery.     PT Comments    Pt continues to report severe Lt knee pain but agreeable to participate in PT session. Remains consistent with mobility but requires max encouragement.   Needs to practice steps prior to d/c home.     Follow Up Recommendations  DC plan and follow up therapy as arranged by surgeon;Supervision for mobility/OOB     Equipment Recommendations  Rolling walker with 5" wheels    Recommendations for Other Services       Precautions / Restrictions Precautions Precautions: Knee Required Braces or Orthoses: Knee Immobilizer - Left Restrictions LLE Weight Bearing: Partial weight bearing LLE Partial Weight Bearing Percentage or Pounds: 50    Mobility  Bed Mobility                  Transfers Overall transfer level: Needs assistance Equipment used: Rolling walker (2 wheeled) Transfers: Sit to/from Stand Sit to Stand: Min guard         General transfer comment: cues for hand placement  Ambulation/Gait Ambulation/Gait assistance: Min guard Ambulation Distance (Feet): 100 Feet Assistive device: Rolling walker (2 wheeled) Gait Pattern/deviations: Step-to pattern;Decreased stance time - left;Decreased weight shift to left;Antalgic Gait velocity: decreased   General Gait Details: antalgic gait. cues for posture & increased heel strike LLE, body positioning to RW, and pursed lip breathing to address SOB.  Requires frequent seated rest breaks due to pain per patient.     Stairs            Wheelchair Mobility    Modified Rankin (Stroke Patients Only)       Balance                                            Cognition Arousal/Alertness: Awake/alert Behavior  During Therapy: WFL for tasks assessed/performed Overall Cognitive Status: Within Functional Limits for tasks assessed                                        Exercises      General Comments        Pertinent Vitals/Pain Pain Assessment: 0-10 Pain Score: 9  Pain Location: left knee Pain Descriptors / Indicators: Aching;Grimacing;Guarding;Pounding;Throbbing;Operative site guarding Pain Intervention(s): Limited activity within patient's tolerance;Monitored during session;Repositioned;Premedicated before session    Home Living                      Prior Function            PT Goals (current goals can now be found in the care plan section) Acute Rehab PT Goals Patient Stated Goal: to go home  PT Goal Formulation: With patient Time For Goal Achievement: 03/06/17 Potential to Achieve Goals: Good Progress towards PT goals: Progressing toward goals(slowly)    Frequency    7X/week      PT Plan Current plan remains appropriate    Co-evaluation              AM-PAC PT "6 Clicks" Daily  Activity  Outcome Measure                   End of Session         PT Visit Diagnosis: Other abnormalities of gait and mobility (R26.89);Pain Pain - Right/Left: Left Pain - part of body: Knee     Time: 1235-1316 PT Time Calculation (min) (ACUTE ONLY): 41 min  Charges:  $Gait Training: 23-37 mins $Therapeutic Activity: 8-22 mins                    G CodesVerdell Face:       Tabbatha Bordelon, [place] [phone] 03/01/2017

## 2017-03-01 NOTE — Progress Notes (Signed)
1:40 pm pt was trying to get back into bed with husband and slid off bed.  Husband set pt to floor, prior to incident pt was watching TV with  husband and thought he could help her back into bed. Pt slid to floor stating she hurt her left ankle. Pt also stated that that ankle has been bothering her since PT today.   Safety huddle called, [facility] notified, dr notified, vitals taken  B/P 103/55, P-84, R-20. Temp 100.5 tylenol given pedal pulses +3, with good range of motion, toes warm to touch, with no discoloration, neuro check done pt alert and orientated x4. Incentive spirometer encouraged 10x hourly. Husband was aware of fall and in room with wife during incident.

## 2017-03-01 NOTE — Progress Notes (Signed)
Physical Therapy Treatment Patient Details Name: Genia Delancy L Heino MRN: [id number] DOB: 11-18-1950 Today's Date: 03/01/2017    History of Present Illness Pt is a 66 y/o female s/p L TKA. PMH includes HTN, OSA on CPAP, R TKA, COPD, PTSD, glaucoma, s/p ACDF, and back surgery.     PT Comments    Pt is progressing with mobility but cont's to be limited by pain as she requires 3 seated rest breaks to ambulate ~100'.  02 sats dropping to 82% RA with ambulation but quickly returned back to 93-94% RA with pursed lip breathing.   Pt states her 02 sats would range from 88%-93% at home.      Follow Up Recommendations  DC plan and follow up therapy as arranged by surgeon;Supervision for mobility/OOB     Equipment Recommendations  Rolling walker with 5" wheels    Recommendations for Other Services       Precautions / Restrictions Precautions Precautions: Knee Required Braces or Orthoses: Knee Immobilizer - Left Restrictions Weight Bearing Restrictions: Yes LLE Weight Bearing: Partial weight bearing LLE Partial Weight Bearing Percentage or Pounds: 50    Mobility  Bed Mobility Overal bed mobility: Needs Assistance Bed Mobility: Sit to Supine     Supine to sit: Min assist     General bed mobility comments: HOB flat requiring min assist to elevate trunk/shoulders to sitting upright.   Transfers Overall transfer level: Needs assistance Equipment used: Rolling walker (2 wheeled) Transfers: Sit to/from Stand Sit to Stand: Min guard         General transfer comment: cues for hand placement  Ambulation/Gait Ambulation/Gait assistance: Min guard Ambulation Distance (Feet): 100 Feet(required 3 seated rest breaks to complete distance) Assistive device: Rolling walker (2 wheeled) Gait Pattern/deviations: Step-to pattern;Decreased stance time - left;Decreased weight shift to left;Antalgic Gait velocity: decreased   General Gait Details: antalgic gait. cues for posture & increased heel  strike LLE, body positioning to RW, and pursed lip breathing to address SOB.  Requires frequent seated rest breaks due to pain per patient.     Stairs            Wheelchair Mobility    Modified Rankin (Stroke Patients Only)       Balance                                            Cognition Arousal/Alertness: Awake/alert Behavior During Therapy: WFL for tasks assessed/performed Overall Cognitive Status: Within Functional Limits for tasks assessed                                        Exercises Total Joint Exercises Ankle Circles/Pumps: AROM;AAROM;Both;20 reps Quad Sets: AROM;Left;Right;10 reps    General Comments General comments (skin integrity, edema, etc.): Left in foam block for knee extension at end of session with ice applied.        Pertinent Vitals/Pain Pain Assessment: 0-10 Pain Score: 8  Pain Location: left knee Pain Descriptors / Indicators: Aching;Grimacing;Guarding;Pounding;Throbbing;Operative site guarding Pain Intervention(s): Limited activity within patient's tolerance;Monitored during session;Ice applied    Home Living                      Prior Function            PT Goals (  current goals can now be found in the care plan section) Acute Rehab PT Goals Patient Stated Goal: to go home  PT Goal Formulation: With patient Time For Goal Achievement: 03/06/17 Potential to Achieve Goals: Good Progress towards PT goals: Progressing toward goals    Frequency    7X/week      PT Plan Current plan remains appropriate    Co-evaluation              AM-PAC PT "6 Clicks" Daily Activity  Outcome Measure                   End of Session               Time: [id number] PT Time Calculation (min) (ACUTE ONLY): 59 min  Charges:  $Gait Training: 38-52 mins $Therapeutic Activity: 8-22 mins                    G CodesVerdell Face:       Consepcion Utt, [place] [phone] 03/01/2017

## 2017-03-02 ENCOUNTER — Encounter (HOSPITAL_COMMUNITY): Payer: Self-pay | Admitting: Orthopedic Surgery

## 2017-03-02 LAB — GLUCOSE, CAPILLARY: Glucose-Capillary: 140 mg/dL — ABNORMAL HIGH (ref 65–99)

## 2017-03-02 LAB — CBC
HCT: 29.4 % — ABNORMAL LOW (ref 36.0–46.0)
Hemoglobin: 9.6 g/dL — ABNORMAL LOW (ref 12.0–15.0)
MCH: 28.7 pg (ref 26.0–34.0)
MCHC: 32.7 g/dL (ref 30.0–36.0)
MCV: 87.8 fL (ref 78.0–100.0)
PLATELETS: 143 10*3/uL — AB (ref 150–400)
RBC: 3.35 MIL/uL — ABNORMAL LOW (ref 3.87–5.11)
RDW: 13.8 % (ref 11.5–15.5)
WBC: 8.9 10*3/uL (ref 4.0–10.5)

## 2017-03-02 NOTE — Care Management Note (Signed)
Case Management Note  Patient Details  Name: Lynn Dixon MRN: [id number] Date of Birth: 1951-02-12  Subjective/Objective:    66 yr old female s/p fall, underwent a left total knee arthroplasty.                Action/Plan: Case manager spoke with patient concerning discharge plan and DME. Patient has [employer] insurance, CM called referral to [facility] [phone], faxed orders, demo and OP note to [phone]. Patient's [phone]#[id number]. Patient may be assigned to [facility], CM told patient to wait until she recieves a call notifying her of agency. Patient will have family support at discharge.   Expected Discharge Date:  03/02/17               Expected Discharge Plan:  Home w Home Health Services  In-House Referral:     Discharge planning Services  CM Consult  Post Acute Care Choice:  Home Health, Durable Medical Equipment Choice offered to:  Patient  DME Arranged:  CPM(has 3in1 and RW) DME Agency:  [employer]  HH Arranged:  PT HH Agency:  ([facility] to arrange home health agency)  Status of Service:  In process, will continue to follow  If discussed at Long Length of Stay Meetings, dates discussed:    Additional Comments:  Durenda GuthrieBrady, Ajahnae Rathgeber Naomi, RN 03/02/2017, 10:37 AM

## 2017-03-02 NOTE — Progress Notes (Signed)
Occupational Therapy Treatment Patient Details Name: Lynn Dixon L Gil MRN: [id number] DOB: 02/11/1951 Today's Date: 03/02/2017    History of present illness Pt is a 66 y/o female s/p L TKA. PMH includes HTN, OSA on CPAP, R TKA, COPD, PTSD, glaucoma, s/p ACDF, and back surgery.    OT comments  Pt progressing towards established OT goals. Pt performing LB dressing with Min A to don pants/underwear over left foot and for safety in standing balance. Educated pt on tub transfer and adherence to [facility] status; pt verbalizing understanding. Continue to recommend dc home once medically stable per physician. Answered all questions in preparation for dc later today.   Follow Up Recommendations  No OT follow up    Equipment Recommendations  3 in 1 bedside commode;Tub/shower bench    Recommendations for Other Services      Precautions / Restrictions Precautions Precautions: Knee Precaution Booklet Issued: Yes (comment) Precaution Comments: Verbally reviewed supine knee ther ex with pt.  Required Braces or Orthoses: Knee Immobilizer - Left Knee Immobilizer - Left: Other (comment)(until discontinued) Restrictions Weight Bearing Restrictions: Yes LLE Weight Bearing: Partial weight bearing LLE Partial Weight Bearing Percentage or Pounds: 50       Mobility Bed Mobility Overal bed mobility: Modified Independent Bed Mobility: Supine to Sit     Supine to sit: Modified independent (Device/Increase time)     General bed mobility comments: In recliner upon arrival  Transfers Overall transfer level: Needs assistance Equipment used: Rolling walker (2 wheeled) Transfers: Sit to/from Stand Sit to Stand: Min guard         General transfer comment: cues for hand placmenent on RW    Balance Overall balance assessment: Needs assistance Sitting-balance support: Feet supported;No upper extremity supported Sitting balance-Leahy Scale: Good     Standing balance support: Bilateral upper extremity  supported Standing balance-Leahy Scale: Poor Standing balance comment: Reliance on UE support for dynamic balance                           ADL either performed or assessed with clinical judgement   ADL Overall ADL's : Needs assistance/impaired                 Upper Body Dressing : Minimal assistance;Sitting Upper Body Dressing Details (indicate cue type and reason): Min A for snapping bra Lower Body Dressing: Minimal assistance;Sit to/from stand;With adaptive equipment(Max A for socks and shoes) Lower Body Dressing Details (indicate cue type and reason): Pt using compensaory techniques and requiring Min A to don underwear over LLE. Mi nA for standing balance while bringing pants over hips. Pt requiring Max A for socks and shoes.  Toilet Transfer: Min guard;Ambulation;RW;BSC Toilet Transfer Details (indicate cue type and reason): mobility to bathroom and tranfer to [facility] over toilet. Min Guard for [employer] Toileting- Clothing Manipulation and Hygiene: Sit to/from stand;Minimal assistance Toileting - Clothing Manipulation Details (indicate cue type and reason): Min A for standing balance   Tub/Shower Transfer Details (indicate cue type and reason): Discussed tub transfer tehcniques with 3N1 and tub bench. Provided handouts for both tehcniques. Educated pt on adherance to WB status during tub transfer Functional mobility during ADLs: Minimal assistance;Rolling walker General ADL Comments: Min A throughout session for balance. Providing education on LB ADLs, toileting, and tub transfer. Discussing safety throughout ADLs      Vision       Perception     Praxis      Cognition Arousal/Alertness: Awake/alert Behavior  During Therapy: WFL for tasks assessed/performed Overall Cognitive Status: Within Functional Limits for tasks assessed                                          Exercises     Shoulder Instructions       General Comments SpO2 dropping to  81 on room air. Placing pt on 2L O2 and SPO2 returning to 92%. Discussed purse lip breathing to increase SpO2 during ADLs.     Pertinent Vitals/ Pain       Pain Assessment: Faces Pain Score: 9  Faces Pain Scale: Hurts even more Pain Location: left knee Pain Descriptors / Indicators: Aching;Grimacing;Guarding;Pounding;Throbbing;Operative site guarding Pain Intervention(s): Monitored during session;Limited activity within patient's tolerance;Repositioned;Patient requesting pain meds-RN notified  Home Living                                          Prior Functioning/Environment              Frequency  Min 2X/week        Progress Toward Goals  OT Goals(current goals can now be found in the care plan section)  Progress towards OT goals: Progressing toward goals  Acute Rehab OT Goals Patient Stated Goal: to go home  OT Goal Formulation: With patient Time For Goal Achievement: 03/13/17 Potential to Achieve Goals: Good ADL Goals Pt Will Perform Lower Body Bathing: with modified independence;with adaptive equipment;sit to/from stand Pt Will Perform Lower Body Dressing: with min assist;with caregiver independent in assisting;sit to/from stand Pt Will Transfer to Toilet: with modified independence;ambulating Pt Will Perform Toileting - Clothing Manipulation and hygiene: with modified independence;sit to/from stand Pt Will Perform Tub/Shower Transfer: with min guard assist;with caregiver independent in assisting;Tub transfer;3 in 1;rolling walker  Plan Discharge plan remains appropriate;Equipment recommendations need to be updated    Co-evaluation                 AM-PAC PT "6 Clicks" Daily Activity     Outcome Measure   Help from another person eating meals?: None Help from another person taking care of personal grooming?: A Little Help from another person toileting, which includes using toliet, bedpan, or urinal?: A Little Help from another person  bathing (including washing, rinsing, drying)?: A Lot Help from another person to put on and taking off regular upper body clothing?: None Help from another person to put on and taking off regular lower body clothing?: A Little 6 Click Score: 19    End of Session Equipment Utilized During Treatment: Gait belt;Rolling walker  OT Visit Diagnosis: Unsteadiness on feet (R26.81);Other abnormalities of gait and mobility (R26.89);Pain Pain - Right/Left: Left Pain - part of body: Knee   Activity Tolerance Patient tolerated treatment well;Patient limited by pain   Patient Left in chair;with call bell/phone within reach   Nurse Communication Mobility status;Patient requests pain meds;Weight bearing status        Time: [id number] OT Time Calculation (min): 38 min  Charges: OT General Charges $OT Visit: 1 Visit OT Treatments $Self Care/Home Management : 38-52 mins  Alonia Dibuono MSOT, OTR/L Acute Rehab Pager: [phone] Office: [phone]   Theodoro GristCharis M Hamad Whyte 03/02/2017, 11:35 AM

## 2017-03-02 NOTE — Progress Notes (Signed)
Physical Therapy Treatment Patient Details Name: Lynn Dixon MRN: [id number] DOB: 12-Sep-1950 Today's Date: 03/02/2017    History of Present Illness Pt is a 66 y/o female s/p L TKA. PMH includes HTN, OSA on CPAP, R TKA, COPD, PTSD, glaucoma, s/p ACDF, and back surgery.     PT Comments    Session focused on progressing ambulation and discussing safety considerations in preparation for d/c home today per PA/RN. Able to ambulate 150 feet with RW, with intermittent rest breaks due to pain. Pt agreeable to home health therapy and looks forward to working with them this week, also has 24/7 care with family for next couple weeks. Pt demonstrating improved safety awareness with RW during ambulation and transfers today than prior sessions. Pt still limited by severe pain and fatigue, educated on monitoring symptoms and taking proactive rest breaks.     Follow Up Recommendations  DC plan and follow up therapy as arranged by surgeon     Equipment Recommendations  Rolling walker with 5" wheels    Recommendations for Other Services       Precautions / Restrictions Precautions Precautions: Knee Required Braces or Orthoses: Knee Immobilizer - Left Restrictions Weight Bearing Restrictions: Yes LLE Weight Bearing: Partial weight bearing LLE Partial Weight Bearing Percentage or Pounds: 50    Mobility  Bed Mobility Overal bed mobility: Modified Independent Bed Mobility: Supine to Sit     Supine to sit: Modified independent (Device/Increase time)     General bed mobility comments: Cues for RLE to assist LLE. can supine to sit without physical assistance  Transfers Overall transfer level: Needs assistance Equipment used: Rolling walker (2 wheeled) Transfers: Sit to/from Stand Sit to Stand: Min guard         General transfer comment: cues for hand placmenent on RW  Ambulation/Gait Ambulation/Gait assistance: Min guard Ambulation Distance (Feet): 150 Feet Assistive device: Rolling  walker (2 wheeled) Gait Pattern/deviations: Step-to pattern;Decreased stance time - left;Decreased weight shift to left;Antalgic Gait velocity: decreased   General Gait Details: antalgic gait. cues for body positioning to RW, and pursed lip breathing to address SOB.  Requires frequent seated rest breaks due to pain per patient     Stairs            Wheelchair Mobility    Modified Rankin (Stroke Patients Only)       Balance Overall balance assessment: Needs assistance Sitting-balance support: Feet supported;No upper extremity supported Sitting balance-Leahy Scale: Good     Standing balance support: Bilateral upper extremity supported Standing balance-Leahy Scale: Poor Standing balance comment: Reliance on UE support for dynamic balance                            Cognition Arousal/Alertness: Awake/alert Behavior During Therapy: WFL for tasks assessed/performed Overall Cognitive Status: Within Functional Limits for tasks assessed                                        Exercises      General Comments General comments (skin integrity, edema, etc.): Extensive time discussing safety considerations for home d/c today and recomendations for rehab. Pt Desat with activity on RA to SpO2=82-84%, rises back to >90% quickly with rest.  SpO2=>90% at Rest on RA       Pertinent Vitals/Pain Pain Assessment: 0-10 Pain Score: 9  Pain Location: left knee Pain  Descriptors / Indicators: Aching;Grimacing;Guarding;Pounding;Throbbing;Operative site guarding Pain Intervention(s): Limited activity within patient's tolerance;Monitored during session    Home Living                      Prior Function            PT Goals (current goals can now be found in the care plan section) Acute Rehab PT Goals Patient Stated Goal: to go home  PT Goal Formulation: With patient Time For Goal Achievement: 03/06/17 Potential to Achieve Goals: Good Progress towards  PT goals: Progressing toward goals    Frequency    7X/week      PT Plan Current plan remains appropriate    Co-evaluation              AM-PAC PT "6 Clicks" Daily Activity  Outcome Measure  Difficulty turning over in bed (including adjusting bedclothes, sheets and blankets)?: A Little Difficulty moving from lying on back to sitting on the side of the bed? : A Lot Difficulty sitting down on and standing up from a chair with arms (e.g., wheelchair, bedside commode, etc,.)?: A Little Help needed moving to and from a bed to chair (including a wheelchair)?: A Little Help needed walking in hospital room?: A Little Help needed climbing 3-5 steps with a railing? : A Lot 6 Click Score: 16    End of Session Equipment Utilized During Treatment: Gait belt;Left knee immobilizer Activity Tolerance: Patient tolerated treatment well Patient left: in chair;with call bell/phone within reach Nurse Communication: Mobility status PT Visit Diagnosis: Other abnormalities of gait and mobility (R26.89);Pain Pain - Right/Left: Left Pain - part of body: Knee     Time: 0825-0859 PT Time Calculation (min) (ACUTE ONLY): 34 min  Charges:  $Gait Training: 23-37 mins                    G Codes:       Etta GrandchildSean Marnell Mcdaniel, PT, DPT Acute Rehab Services Pager: [phone]    Etta GrandchildSean  Amelio Brosky 03/02/2017, 9:18 AM

## 2017-03-02 NOTE — Discharge Summary (Signed)
PATIENT ID: Lynn Delancy L Todt        MRN:  [id number]          DOB/AGE: December 14, 1950 / 66 y.o.    DISCHARGE SUMMARY  ADMISSION DATE:    02/27/2017 DISCHARGE DATE:   03/02/2017   ADMISSION DIAGNOSIS: OA LEFT KNEE    DISCHARGE DIAGNOSIS:  OA LEFT KNEE    ADDITIONAL DIAGNOSIS: Active Problems:   Primary localized osteoarthritis of left knee  Past Medical History:  Diagnosis Date  . Allergy    takes Zyrtec and Singulair daily  . Arthritis    BACK  . Chronic neck pain   . Constipation    takes Sennokot daily  . COPD (chronic obstructive pulmonary disease) (HCC)    uses Symbicort daily and Albuterol as needed  . Depression    PTSD-takes Zoloft daily  . Diabetes mellitus without complication (HCC)    diet controled  . GERD (gastroesophageal reflux disease)    takes Omeprazole daily  . Glaucoma    mild in left eye  . H/O hiatal hernia   . History of blood transfusion   . History of bronchitis   . History of colon polyps    benign  . Hyperlipidemia    takes Simvastatin daily  . Hypertension    takes Lisinopril daily  . Insomnia    takes Melatonin nightly  . Joint pain   . Joint swelling   . Pneumonia 2017  . Shortness of breath    with exertion occasionally  . Sleep apnea    USES CPAP  . Urinary urgency   . Weakness    numbness and tingling. Takes Gabapentin daily    PROCEDURE: Procedure(s): LEFT TOTAL KNEE ARTHROPLASTY Left on 02/27/2017  CONSULTS: PT/OT    HISTORY:  See H&P in chart  HOSPITAL COURSE:  Lynn Dixon is a 66 y.o. admitted on 02/27/2017 and found to have a diagnosis of OA LEFT KNEE.  After appropriate laboratory studies were obtained  they were taken to the operating room on 02/27/2017 and underwent  Procedure(s): LEFT TOTAL KNEE ARTHROPLASTY  Left.   They were given perioperative antibiotics:  Anti-infectives (From admission, onward)   Start     Dose/Rate Route Frequency Ordered Stop   02/27/17 2000  vancomycin (VANCOCIN) IVPB 1000 mg/200 mL  premix     1,000 mg 200 mL/hr over 60 Minutes Intravenous Every 12 hours 02/27/17 1244 02/27/17 2145   02/27/17 0630  vancomycin (VANCOCIN) IVPB 1000 mg/200 mL premix     1,000 mg 200 mL/hr over 60 Minutes Intravenous To ShortStay Surgical 02/26/17 1159 02/27/17 0845    .  Tolerated the procedure well.    POD #1, allowed out of bed to a chair.  PT for ambulation and exercise program.   IV saline locked.  O2 discontionued.  POD #2, continued PT and ambulation.    The remainder of the hospital course was dedicated to ambulation and strengthening.   The patient was discharged on 3 Days Post-Op in  Stable condition.  Blood products given:none  DIAGNOSTIC STUDIES: Recent vital signs:  Patient Vitals for the past 24 hrs:  BP Temp Temp src Pulse Resp SpO2  03/02/17 0910 - - - - - 93 %  03/02/17 0649 106/86 100 F (37.8 C) Oral 73 - 90 %  03/01/17 2004 133/90 99.4 F (37.4 C) Oral 76 - 95 %  03/01/17 1604 100/67 100 F (37.8 C) Oral 83 16 (!) 88 %  03/01/17 1423 (!)  103/55 (!) 100.6 F (38.1 C) Oral 80 15 94 %  03/01/17 1300 (!) 103/55 (!) 100.5 F (38.1 C) Oral 84 - 100 %  03/01/17 1052 100/60 - - - - -       Recent laboratory studies: Recent Labs    02/27/17 0708 02/28/17 0445 03/01/17 0416 03/02/17 0520  WBC  --  8.9 8.8 8.9  HGB  --  10.6* 9.9* 9.6*  HCT  --  32.4* 30.0* 29.4*  PLT 134* 130* 129* 143*   Recent Labs    02/28/17 0445  NA 136  K 4.3  CL 104  CO2 24  BUN 10  CREATININE 0.85  GLUCOSE 129*  CALCIUM 8.7*   Lab Results  Component Value Date   INR 0.98 02/16/2017     Recent Radiographic Studies :  Dg Chest 2 View  Result Date: 02/16/2017 CLINICAL DATA:  Preop total knee replacement EXAM: CHEST  2 VIEW COMPARISON:  02/26/2016 FINDINGS: Lungs are clear.  No pleural effusion or pneumothorax. The heart is normal in size. Mild degenerative changes of the visualized thoracolumbar spine. Cervical spine and lumbar spine fixation hardware, incompletely  visualized. IMPRESSION: Normal chest radiographs. Electronically Signed   By: Charline BillsSriyesh  Krishnan M.D.   On: 02/16/2017 18:10   Dg Knee Left Port  Result Date: 02/27/2017 CLINICAL DATA:  Status post total knee replacement EXAM: PORTABLE LEFT KNEE - 1-2 VIEW COMPARISON:  None. FINDINGS: Frontal and lateral views were obtained. There is a total knee replacement with femoral and tibial prosthetic components well-seated. No fracture or dislocation. Fluid and air is noted within the joint, an expected postoperative finding. There are overlying skin staples. IMPRESSION: Prosthetic components well-seated.  No fracture or dislocation. Electronically Signed   By: Bretta BangWilliam  Woodruff III M.D.   On: 02/27/2017 11:21    DISCHARGE INSTRUCTIONS:   DISCHARGE MEDICATIONS:   Allergies as of 03/02/2017      Reactions   Coconut Flavor Anaphylaxis   Anything with coconut in it   Coconut Oil Anaphylaxis   Cephalosporins Hives   Morphine And Related Hives, Itching   Sulfa Antibiotics Hives, Rash   Sulfasalazine Hives, Rash   Ketorolac    UNSPECIFIED REACTION    Cefuroxime Axetil Itching, Rash   Estrogens Rash      Medication List    STOP taking these medications   aspirin 81 MG chewable tablet   cyclobenzaprine 10 MG tablet Commonly known as:  FLEXERIL   HYDROcodone-acetaminophen 5-325 MG tablet Commonly known as:  NORCO/VICODIN   naproxen sodium 220 MG tablet Commonly known as:  ALEVE   PENNSAID 2 % Soln Generic drug:  Diclofenac Sodium     TAKE these medications   albuterol 108 (90 Base) MCG/ACT inhaler Commonly known as:  PROVENTIL HFA;VENTOLIN HFA Inhale 2 puffs into the lungs every 6 (six) hours as needed for wheezing or shortness of breath.   apixaban 2.5 MG Tabs tablet Commonly known as:  ELIQUIS Take 1 tablet (2.5 mg total) 2 (two) times daily by mouth.   budesonide-formoterol 160-4.5 MCG/ACT inhaler Commonly known as:  SYMBICORT Inhale 2 puffs into the lungs 2 (two) times  daily. What changed:    when to take this  reasons to take this   CALCIUM 500/D PO Take 1 tablet by mouth daily.   cetirizine 10 MG tablet Commonly known as:  ZYRTEC Take 10 mg by mouth daily.   cholecalciferol 1000 units tablet Commonly known as:  VITAMIN D Take 1,000 Units by mouth  daily.   Fish Oil 1000 MG Caps Take 1,000-2,000 mg by mouth See admin instructions. Takes 2 tablets in the morning in the morning and 1 tablet in the evening   furosemide 40 MG tablet Commonly known as:  LASIX Take 40 mg by mouth daily.   gabapentin 300 MG capsule Commonly known as:  NEURONTIN Take 900 mg by mouth 2 (two) times daily.   GLUCOSAMINE-CHONDROITIN PO Take 1 tablet by mouth daily.   lisinopril 40 MG tablet Commonly known as:  PRINIVIL,ZESTRIL Take 20 mg by mouth daily.   Melatonin 3 MG Tabs Take 6 mg by mouth at bedtime.   montelukast 10 MG tablet Commonly known as:  SINGULAIR Take 10 mg by mouth daily.   multivitamin with minerals Tabs tablet Take 1 tablet by mouth daily.   omeprazole 20 MG capsule Commonly known as:  PRILOSEC Take 40 mg by mouth daily before breakfast.   oxyCODONE-acetaminophen 5-325 MG tablet Commonly known as:  ROXICET Take 1-2 tablets every 4 (four) hours as needed by mouth for severe pain.   senna 8.6 MG Tabs tablet Commonly known as:  SENOKOT Take 1 tablet by mouth daily.   sertraline 100 MG tablet Commonly known as:  ZOLOFT Take 100 mg by mouth 2 (two) times daily.   simvastatin 40 MG tablet Commonly known as:  ZOCOR Take 20 mg by mouth daily. TAKES 0.5 TABLET       FOLLOW UP VISIT:   Follow-up Information    Frederico Hamman, MD. Schedule an appointment as soon as possible for a visit in 2 weeks.   Specialty:  Orthopedic Surgery Contact information: [address] [place] [address] [phone]           DISPOSITION:   Home  CONDITION:  Stable   Margart Sickles, PA-C  03/02/2017 9:16  AM

## 2017-03-02 NOTE — Progress Notes (Signed)
Reviewed discharge paper work and medications, with family, changed dressing with aqual-cel dressing, C,D,I,

## 2017-03-04 ENCOUNTER — Other Ambulatory Visit: Payer: Self-pay

## 2017-03-04 NOTE — Patient Outreach (Signed)
[facility]) Care Management  03/04/2017  Lynn Dixon 1950/06/06 [id number]  Transition of care  Referral date: 03/04/17 Referral source: status post hospital discharge 03/02/17 for left knee replacement Insurance: [employer]  Telephone call to patient for transition of care follow up. HIPAA verified with patient. Discussed transition of care follow up with patient. Patient states her left knee is swollen and she is in some pain. Patient states she has her CPM equipment and is maintaining ice to knee as instructed. Patient states she was told that she would have home health but has not received call from home health agency yet. Advised patient to call [employer] [facility] or contact [facility].  Patient given phone number for [employer] and advised to call number on her [employer] card for customer services.  Patient states she has a follow up appointment scheduled with Dr. Madelon Lipsaffrey, surgeon on 03/18/17. Patient states, " I don't need to see my primary doctor."  RNCM explained to patient need for post hospital discharge follow up with primary MD.  Informed patient RNCM will call her primary doctor to set up follow up appointment. Patient verbalized understanding and appreciation. Patient reports having all of her medications. Patient states she has not started back on a few of her routine medications such as her vitamins because she has not been told to. RNCM informed patient that her primary MD would instruct her regarding her vitamin  medications.   Patient states she was recently told she has diabetes. Patient denies taking diabetic medication. Patient states her doctor advised her to manage her diabetes with diet.  RNCM advised patient to request a referral to a nutritionist or dietician for additional diet education. Advised patient to call [facility] to inquire if she has coverage for nutritionist/ dietician visit. Patient verbalized understanding.   RNCM reviewed signs/ symptoms of  infection with patient. Advised patient to call doctor for symptoms. RNCM reviewed with patient how to reach doctors offices after hours. Patient reports having transportation to her appointments.  RNCM advised patient to notify MD of any changes in condition prior to scheduled appointment. RNCM verified patient aware of 911 services for urgent/ emergent needs.   RNCM contacted patients primary MD office and [facility] for a return call and follow up appointment.   PLAN:  RNCM will follow up with patient to provide post hospital discharge appointment date.  RNCM will send [facility] care management packet and success letter.  George InaDavina Linkon Siverson RN,BSN,CCM [facility] Telephonic  [phone]

## 2017-03-09 ENCOUNTER — Other Ambulatory Visit: Payer: Self-pay | Admitting: *Deleted

## 2017-03-09 NOTE — Patient Outreach (Addendum)
[facility]) Care Management  03/09/2017  [name] [birth year] [id number]   Received update from covering RNCM George InaDavina Dixon, transition of care follow up completed on 03/04/17.   Sent the following secure email request to Annitta NeedsKim Lampel at [employer]:  Please verify that [employer] [facility] has given authorization and set up home care services on the below patient.    Per Lynn, as of 03/04/17 morning, [employer] [facility]  nor the home health agency had contacted the patient.  Lynn advised patient to follow up with [employer] [facility] and /or [employer] Customer services as needed.   No further CM needs at this time and case will remain closed.      Lynn Devinney H. Gardiner Barefootooper RN, BSN, CCM [facility] Care [facility] Telephonic CM Phone: [phone] Fax: [phone]

## 2017-03-09 NOTE — Telephone Encounter (Signed)
This encounter was created in error - please disregard.

## 2017-03-10 NOTE — Care Management (Signed)
03/10/17 12:52pm Case manager received a call from [employer], company that arranges Home Health for [employer] members, stating they have not found a home health agency for patient. CM informed her that they need to notify Dr. Candise Bowensaffrey's office immediately.

## 2017-09-25 IMAGING — CR DG CHEST 1V PORT
1 series · 1 of 1 positions shown · non-contrast
Comparison: February 24, 2016

CLINICAL DATA: Hypoxia

EXAM:
PORTABLE CHEST 1 VIEW

[AP]
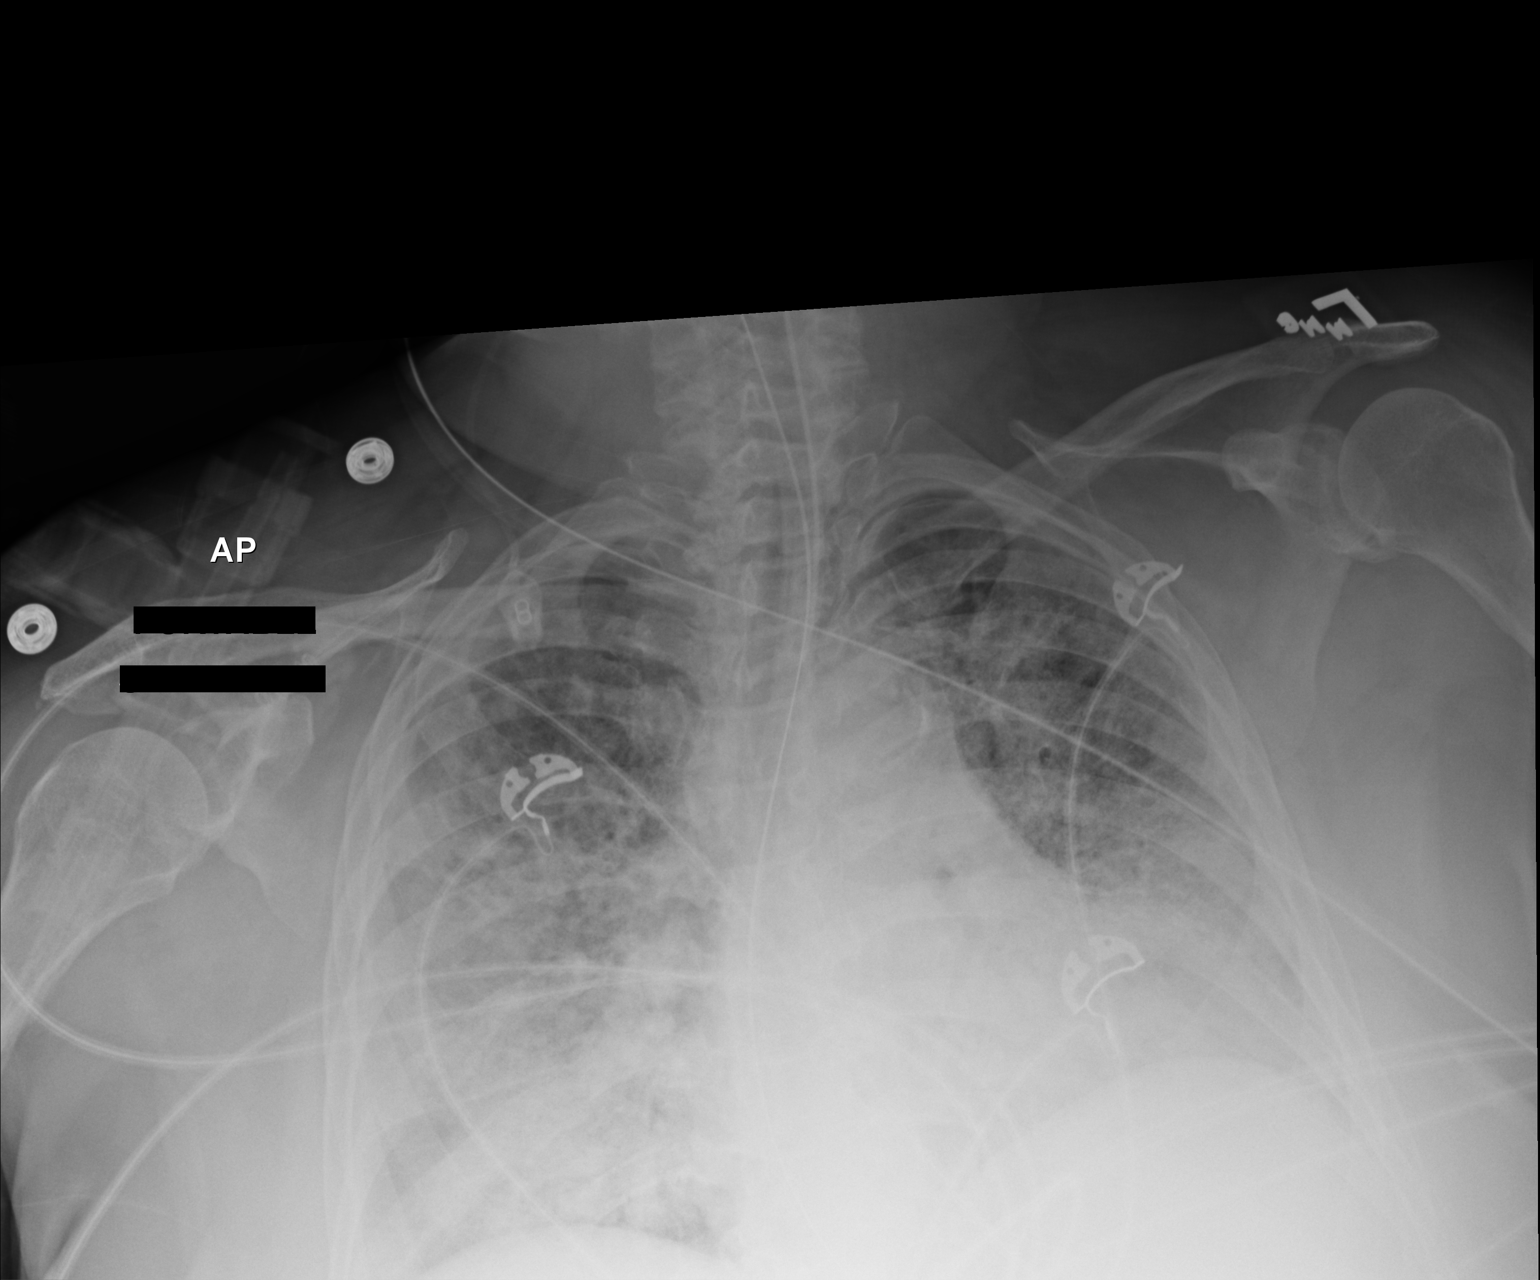

[1 of 1 positions shown; findings below may reference images not displayed]

FINDINGS: Endotracheal tube tip is 4.5 cm above the carina. Nasogastric tube
tip side port are below the diaphragm. No pneumothorax. There is
interstitial and patchy alveolar pulmonary edema bilaterally. There
is mild cardiomegaly with pulmonary venous hypertension. There is
atherosclerotic calcification in the aorta. No evident adenopathy.
No bone lesions.
IMPRESSION: Tube and catheter positions as described without pneumothorax.
Evidence of a degree of congestive heart failure. Superimposed
pneumonia is questioned, particularly in the lower lung zones. There
is aortic atherosclerosis.

## 2017-09-25 IMAGING — CR DG ABD PORTABLE 1V
1 series · 1 of 1 positions shown · non-contrast
Comparison: None.

CLINICAL DATA: Nasogastric tube placement

EXAM:
PORTABLE ABDOMEN - 1 VIEW

[supine ap]
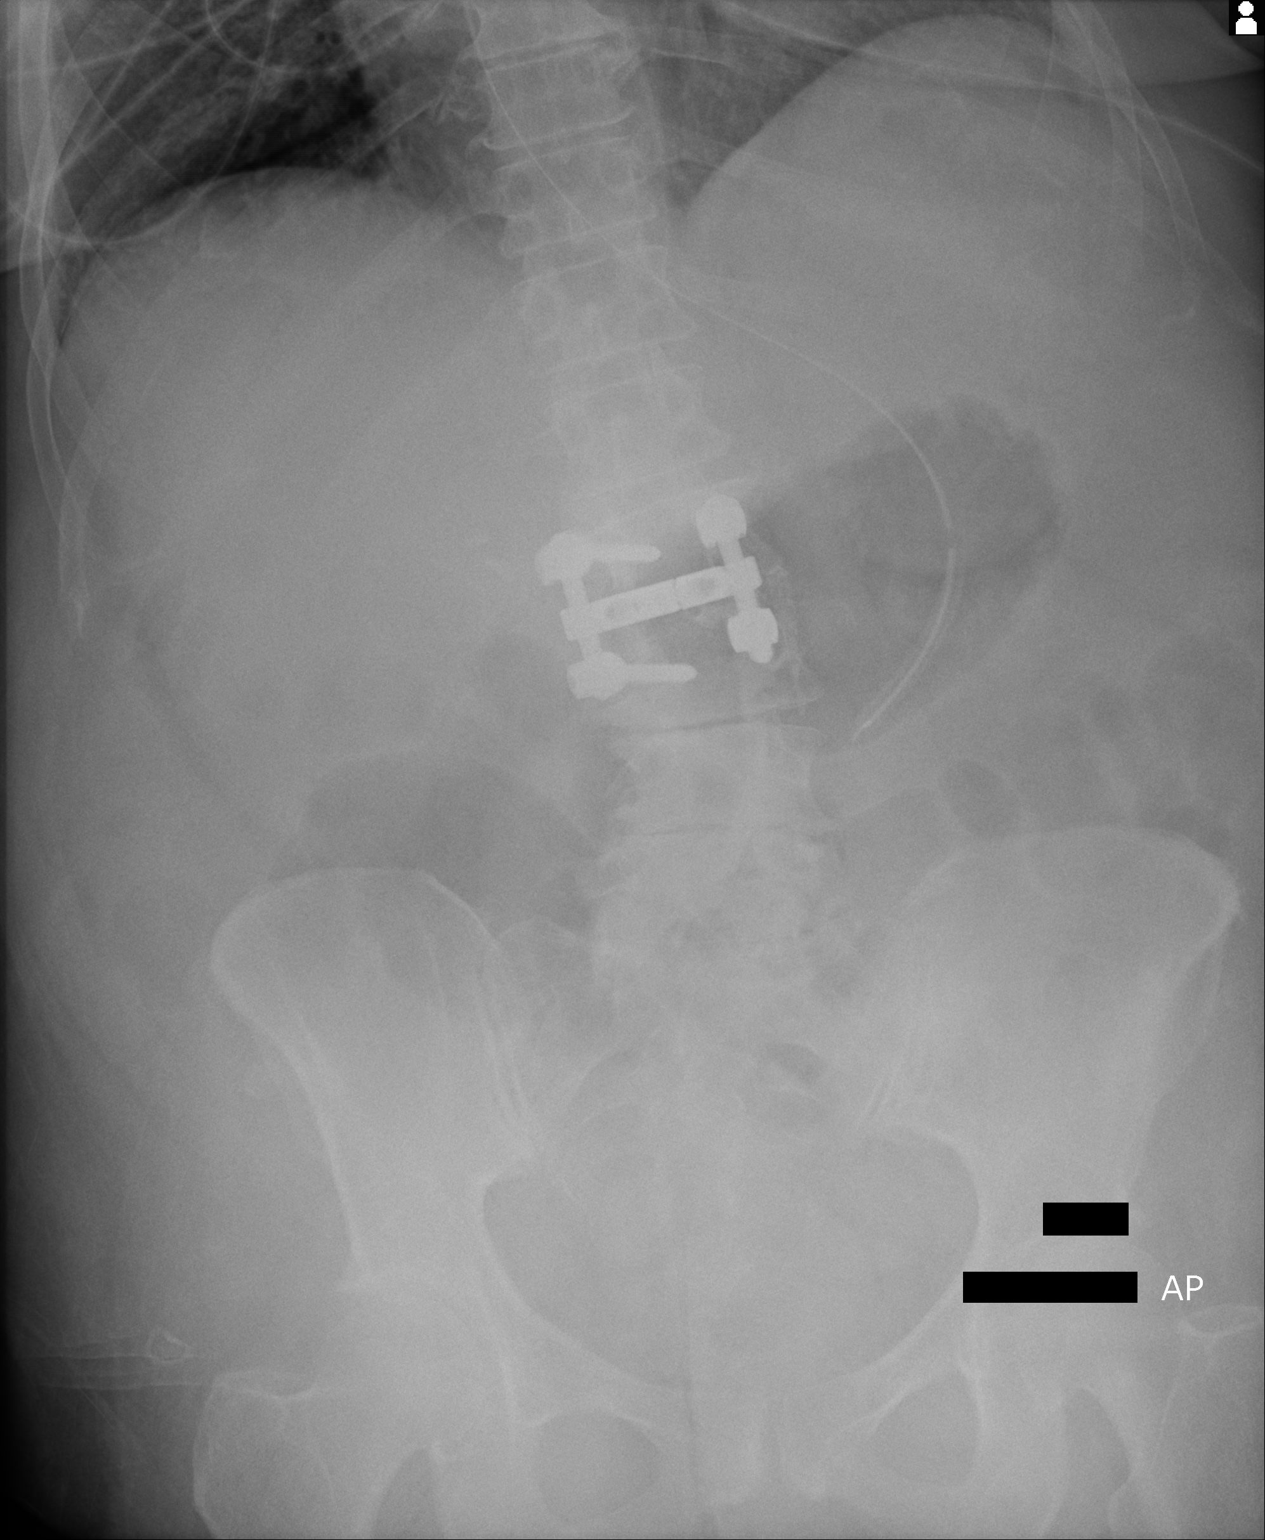

[1 of 1 positions shown; findings below may reference images not displayed]

FINDINGS: Nasogastric tube tip and side port in stomach. The bowel gas pattern
is unremarkable. No bowel obstruction or free air evident.
Visualized bases clear. Postoperative change in the lumbar region.
IMPRESSION: Nasogastric tube tip and side port in stomach. Bowel gas pattern
unremarkable without demonstrable bowel obstruction or free air.

## 2018-02-21 IMAGING — RF DG C-ARM 61-120 MIN
1 series · 1 of 1 positions shown · non-contrast
Comparison: 06/25/2016

CLINICAL DATA: ACDF C3 through C6

EXAM:
DG C-ARM 61-120 MIN; DG CERVICAL SPINE - 1 VIEW

[Series 1: run · 1 of 1 slices shown]
[im 1/1]
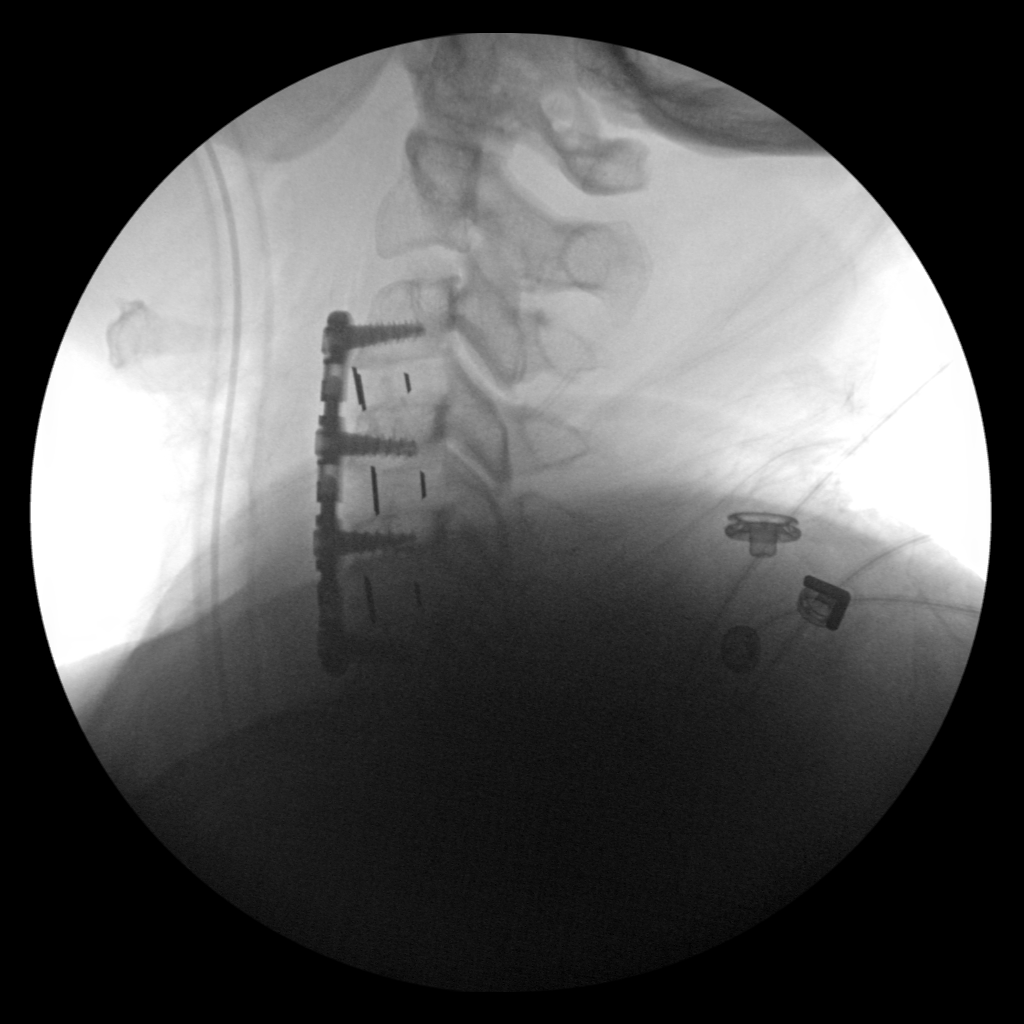

[1 of 1 positions shown; findings below may reference images not displayed]

FINDINGS: Single lateral view of the cervical spine was obtained. ACDF C3
through C6. Anterior plate and screws in good position. Interbody
spacers in good position.
IMPRESSION: ACDF C3 through C6.

## 2018-04-02 ENCOUNTER — Encounter: Payer: Self-pay | Admitting: Cardiology

## 2018-04-02 ENCOUNTER — Ambulatory Visit (INDEPENDENT_AMBULATORY_CARE_PROVIDER_SITE_OTHER): Payer: Managed Care, Other (non HMO) | Admitting: Cardiology

## 2018-04-02 VITALS — BP 132/74 | HR 89 | Ht <= 58 in | Wt 214.0 lb

## 2018-04-02 DIAGNOSIS — R0602 Shortness of breath: Secondary | ICD-10-CM | POA: Diagnosis not present

## 2018-04-02 DIAGNOSIS — R0789 Other chest pain: Secondary | ICD-10-CM | POA: Diagnosis not present

## 2018-04-02 NOTE — Progress Notes (Signed)
Clinical Summary Lynn Dixon is a 67 y.o.female seen as new consult, referred by Dr Argie RammingBurdnine for SOB and chest pain.   1. DOE/Chest pain - symptoms started with diaphoretic episodes with activity in the summer. Some tightness in chest, left sided. +SOB. 4/10 in severity. Would stop and rest with some improvement. Progressed over the last few months - finally discussed with pcp a few weeks ago.  - 02/2018 CT PE negaitve per pcp notes - 03/29/2018 [facility]  nuclear stress test ordered by pcp: fixed distal anterior/apical defect. Mild basal anterior ischemia. LVEF 48% per nuclear   - worst over the last few weeks. With activity, example walking at grocery store holding onto buggy due to SOB, or vacuuming. Still with chest tightness, can be up 6-7/10 in severity. Can occur 2-3 times per days   - remote cath told small blockage nearly 10 years ago.   CAD risk factors: HTN, HL, former smoker x 40 years, mother with MI late 5570s.    2. COPD - followed by pcp     Past Medical History:  Diagnosis Date  . Allergy    takes Zyrtec and Singulair daily  . Arthritis    BACK  . Chronic neck pain   . Constipation    takes Sennokot daily  . COPD (chronic obstructive pulmonary disease) (HCC)    uses Symbicort daily and Albuterol as needed  . Depression    PTSD-takes Zoloft daily  . Diabetes mellitus without complication (HCC)    diet controled  . GERD (gastroesophageal reflux disease)    takes Omeprazole daily  . Glaucoma    mild in left eye  . H/O hiatal hernia   . History of blood transfusion   . History of bronchitis   . History of colon polyps    benign  . Hyperlipidemia    takes Simvastatin daily  . Hypertension    takes Lisinopril daily  . Insomnia    takes Melatonin nightly  . Joint pain   . Joint swelling   . Pneumonia 2017  . Shortness of breath    with exertion occasionally  . Sleep apnea    USES CPAP  . Urinary urgency   . Weakness    numbness and  tingling. Takes Gabapentin daily     Allergies  Allergen Reactions  . Coconut Flavor Anaphylaxis    Anything with coconut in it  . Coconut Oil Anaphylaxis  . Cephalosporins Hives  . Morphine And Related Hives and Itching  . Sulfa Antibiotics Hives and Rash  . Sulfasalazine Hives and Rash  . Ketorolac     UNSPECIFIED REACTION   . Cefuroxime Axetil Itching and Rash  . Estrogens Rash        Past Surgical History:  Procedure Laterality Date  . ANTERIOR CERVICAL DECOMP/DISCECTOMY FUSION N/A 07/23/2016   Procedure: CERVICAL THREE-FOUR, CERVICAL FOUR-FIVE, CERVICAL FIVE-SIX ANTERIOR CERVICAL DECOMPRESSION/DISCECTOMY FUSION;  Surgeon: Julio SicksHenry Pool, MD;  [facility];  Service: Neurosurgery;  Laterality: N/A;  . APPENDECTOMY    . BACK SURGERY     x 4  . BREAST REDUCTION SURGERY    . CARPEL TUNNEL Bilateral   . CATARACT EXTRACTION, BILATERAL  spring 2018   Dr. Darrick GrinderHechler  . CHOLECYSTECTOMY    . COLONOSCOPY    . ESOPHAGOGASTRODUODENOSCOPY    . LUMBAR LAMINECTOMY/DECOMPRESSION MICRODISCECTOMY Left 12/03/2012   Procedure: Left Lumbar Four to Five Diskectomy;  Surgeon: Karn CassisErnesto M Botero, MD;  Location: [facility];  Service:  Neurosurgery;  Laterality: Left;  LUMBAR LAMINECTOMY/DECOMPRESSION MICRODISCECTOMY 1 LEVEL  . TOTAL KNEE ARTHROPLASTY Right   . TOTAL KNEE ARTHROPLASTY Left 02/27/2017   Procedure: LEFT TOTAL KNEE ARTHROPLASTY;  Surgeon: Frederico Hammanaffrey, Daniel, MD;  [facility];  Service: Orthopedics;  Laterality: Left;     Allergies  Allergen Reactions  . Coconut Flavor Anaphylaxis    Anything with coconut in it  . Coconut Oil Anaphylaxis  . Cephalosporins Hives  . Morphine And Related Hives and Itching  . Sulfa Antibiotics Hives and Rash  . Sulfasalazine Hives and Rash  . Ketorolac     UNSPECIFIED REACTION   . Cefuroxime Axetil Itching and Rash  . Estrogens Rash      No family history on file.   Social History Lynn Dixon reports that she has quit smoking. She has  never used smokeless tobacco. Lynn Dixon reports no history of alcohol use.   Review of Systems CONSTITUTIONAL: No weight loss, fever, chills, weakness or fatigue.  HEENT: Eyes: No visual loss, blurred vision, double vision or yellow sclerae.No hearing loss, sneezing, congestion, runny nose or sore throat.  SKIN: No rash or itching.  CARDIOVASCULAR: per hpi RESPIRATORY: per hpi GASTROINTESTINAL: No anorexia, nausea, vomiting or diarrhea. No abdominal pain or blood.  GENITOURINARY: No burning on urination, no polyuria NEUROLOGICAL: No headache, dizziness, syncope, paralysis, ataxia, numbness or tingling in the extremities. No change in bowel or bladder control.  MUSCULOSKELETAL: No muscle, back pain, joint pain or stiffness.  LYMPHATICS: No enlarged nodes. No history of splenectomy.  PSYCHIATRIC: No history of depression or anxiety.  ENDOCRINOLOGIC: No reports of sweating, cold or heat intolerance. No polyuria or polydipsia.  Marland Kitchen.   Physical Examination Vitals:   04/02/18 1330  BP: 132/74  Pulse: 89  SpO2: 93%   Vitals:   04/02/18 1330  Weight: 214 lb (97.1 kg)  Height: 4' 9.5" (1.461 m)    Gen: resting comfortably, no acute distress HEENT: no scleral icterus, pupils equal round and reactive, no palptable cervical adenopathy,  CV: RRR, 2/6 systolic murmur, no jvd Resp: Clear to auscultation bilaterally GI: abdomen is soft, non-tender, non-distended, normal bowel sounds, no hepatosplenomegaly MSK: extremities are warm, no edema.  Skin: warm, no rash Neuro:  no focal deficits Psych: appropriate affect   Diagnostic Studies 02/2016 echo LVEF 55-60%, no WMAS, grade I diastolic dysfunction.       Assessment and Plan  1. DOE/Chest pain - progressing exertional symptoms - nuclear stress test with mild ischemia, however her progressing symptoms are concerning. Nuclear stress sugest mildly decreased LVEF - obtain echo to verify LVEF, also evaluate mild heart murmur - pending  results would anticipate a LHC/RHC. RHC due to her significant DOE that is more pronounced than her chest pain and stress test would support   F/u pending echo results. Likely plan for LHC/RHC after echo      Antoine PocheJonathan F. Gray Doering, M.D.

## 2018-04-02 NOTE — Patient Instructions (Signed)
Medication Instructions:  Your physician recommends that you continue on your current medications as directed. Please refer to the Current Medication list given to you today.   Labwork: none  Testing/Procedures: Your physician has requested that you have an echocardiogram. Echocardiography is a painless test that uses sound waves to create images of your heart. It provides your doctor with information about the size and shape of your heart and how well your heart's chambers and valves are working. This procedure takes approximately one hour. There are no restrictions for this procedure.    Follow-Up: Your physician recommends that you schedule a follow-up appointment in: to be determined based on test    Any Other Special Instructions Will Be Listed Below (If Applicable).     If you need a refill on your cardiac medications before your next appointment, please call your pharmacy.   

## 2018-04-05 ENCOUNTER — Ambulatory Visit (HOSPITAL_COMMUNITY)
Admission: RE | Admit: 2018-04-05 | Discharge: 2018-04-05 | Disposition: A | Discharge status: Home / Self Care | Payer: Managed Care, Other (non HMO) | Source: Ambulatory Visit | Attending: Cardiology | Admitting: Cardiology

## 2018-04-05 DIAGNOSIS — R0602 Shortness of breath: Secondary | ICD-10-CM | POA: Diagnosis not present

## 2018-04-05 NOTE — Progress Notes (Signed)
*  PRELIMINARY RESULTS* Echocardiogram 2D Echocardiogram has been performed.  Stacey DrainWhite, Montrell Cessna J 04/05/2018, 10:19 AM

## 2018-04-09 ENCOUNTER — Telehealth: Payer: Self-pay

## 2018-04-09 NOTE — Telephone Encounter (Signed)
Pt. Made aware. Appointment made.

## 2018-04-09 NOTE — Telephone Encounter (Signed)
-----   Message from Antoine PocheJonathan F Branch, MD sent at 04/09/2018  1:56 PM EST ----- Echo looks good, normal heart functoin. Can she follow up with PA in 2-3 weeks to reevaluate symptoms and discuss possible cath pending her symptoms   J BrancH MD

## 2018-05-06 ENCOUNTER — Encounter: Payer: Self-pay | Admitting: Physician Assistant

## 2018-05-06 NOTE — H&P (View-Only) (Signed)
Cardiology Office Note    Date:  05/07/2018  ID:  Lynn Delancy L Soave, DOB 10/01/1950, MRN [id number] PCP:  Selinda FlavinHoward, Kevin, MD  Cardiologist:  Dina RichBranch, Jonathan, MD  Chief Complaint: discuss echo results  History of Present Illness:  Lynn Dixon is a 68 y.o. female with history of HTN, HLD, former tobacco x 40 years, COPD, allergies, arthritis, depression, diet controlled DM, GERD, glaucoma, hiatal hernia, OSA on CPAP, morbid obesity, ? chronic anemia/thrombocytopenia who presents for f/u. She was seen 04/02/18 as new consult for intermittent chest tightness and dyspnea on exertion that started over the summer, associated with diaphoresis. CT PE 02/2018 showed no PE per PCP report. 03/29/2018 [facility]  nuclear stress test ordered by pcp: fixed distal anterior/apical defect, mild basal anterior ischemia, LVEF 48% per nuclear. Dr. Wyline MoodBranch recommended echocardiogram then reassessment to discuss possible R & L heart cath. Echo 04/05/18 showed EF 55-60%, indeterminate diastolic function, PASP could not be accurately estimated, prominent pericardial fat pad. No recent labs on file. 02/2017 CBC showed Hgb 9.9, plt 129, K 4.3, glucose 129, Cr 0.85, LFTs ok - obtained during admit for knee surgery. She does appear to have some degree of baseline anemia/thrombocytopenia prior to surgery (Hgb 11.4, plt 116).  She returns for follow-up today to discuss above echo reports. Her exertional symptoms remain similar to before, described as tightness, dyspnea and diaphoresis with exertion such as running the vacuum or exerting herself. She has not had any symptoms at rest and feels well in the office today. Denies any hx of bleeding. Reports a remote cath 15 yrs ago with some sort of blockage but I do not have those results. Takes Lasix PRN.  Past Medical History:  Diagnosis Date  . Abnormal stress test   . Allergy   . Arthritis    BACK  . Chronic neck pain   . Constipation    takes Sennokot daily  . COPD  (chronic obstructive pulmonary disease) (HCC)   . Depression    PTSD-takes Zoloft daily  . Diabetes mellitus without complication (HCC)    diet controled  . GERD (gastroesophageal reflux disease)   . Glaucoma    mild in left eye  . H/O hiatal hernia   . History of blood transfusion   . History of bronchitis   . History of colon polyps    benign  . Hyperlipidemia   . Hypertension   . Morbid obesity (HCC)   . Sleep apnea    USES CPAP  . Weakness    numbness and tingling. Takes Gabapentin daily    Past Surgical History:  Procedure Laterality Date  . ANTERIOR CERVICAL DECOMP/DISCECTOMY FUSION N/A 07/23/2016   Procedure: CERVICAL THREE-FOUR, CERVICAL FOUR-FIVE, CERVICAL FIVE-SIX ANTERIOR CERVICAL DECOMPRESSION/DISCECTOMY FUSION;  Surgeon: Julio SicksHenry Pool, MD;  [facility];  Service: Neurosurgery;  Laterality: N/A;  . APPENDECTOMY    . BACK SURGERY     x 4  . BREAST REDUCTION SURGERY    . CARPEL TUNNEL Bilateral   . CATARACT EXTRACTION, BILATERAL  spring 2018   Dr. Darrick GrinderHechler  . CHOLECYSTECTOMY    . COLONOSCOPY    . ESOPHAGOGASTRODUODENOSCOPY    . LUMBAR LAMINECTOMY/DECOMPRESSION MICRODISCECTOMY Left 12/03/2012   Procedure: Left Lumbar Four to Five Diskectomy;  Surgeon: Karn CassisErnesto M Botero, MD;  Location: [facility];  Service: Neurosurgery;  Laterality: Left;  LUMBAR LAMINECTOMY/DECOMPRESSION MICRODISCECTOMY 1 LEVEL  . TOTAL KNEE ARTHROPLASTY Right   . TOTAL KNEE ARTHROPLASTY Left 02/27/2017   Procedure: LEFT TOTAL  KNEE ARTHROPLASTY;  Surgeon: Frederico Hammanaffrey, Daniel, MD;  [facility];  Service: Orthopedics;  Laterality: Left;    Current Medications: Current Meds  Medication Sig  . albuterol (PROVENTIL HFA;VENTOLIN HFA) 108 (90 Base) MCG/ACT inhaler Inhale 2 puffs into the lungs every 6 (six) hours as needed for wheezing or shortness of breath.  . ARIPiprazole (ABILIFY) 2 MG tablet Take 2 mg by mouth daily.  Marland Kitchen. aspirin EC 81 MG tablet Take 81 mg by mouth daily.  . budesonide-formoterol  (SYMBICORT) 160-4.5 MCG/ACT inhaler Inhale 2 puffs into the lungs 2 (two) times daily. (Patient taking differently: Inhale 2 puffs into the lungs 2 (two) times daily as needed. )  . Calcium Carbonate-Vitamin D (CALCIUM 500/D PO) Take 1 tablet by mouth daily.  . cetirizine (ZYRTEC) 10 MG tablet Take 10 mg by mouth daily.  . cholecalciferol (VITAMIN D) 1000 units tablet Take 1,000 Units by mouth daily.  . furosemide (LASIX) 40 MG tablet Take 40 mg by mouth daily.  Marland Kitchen. gabapentin (NEURONTIN) 300 MG capsule Take 900 mg by mouth 2 (two) times daily.   Marland Kitchen. GLUCOSAMINE-CHONDROITIN PO Take 1 tablet by mouth daily.  Marland Kitchen. lisinopril (PRINIVIL,ZESTRIL) 40 MG tablet Take 20 mg by mouth daily.   . Melatonin 3 MG TABS Take 6 mg by mouth at bedtime.   . montelukast (SINGULAIR) 10 MG tablet Take 10 mg by mouth daily.   . Multiple Vitamin (MULTIVITAMIN WITH MINERALS) TABS tablet Take 1 tablet by mouth daily.  . Omega-3 Fatty Acids (FISH OIL) 1000 MG CAPS Take 1,000-2,000 mg by mouth See admin instructions. Takes 2 tablets in the morning in the morning and 1 tablet in the evening  . omeprazole (PRILOSEC) 20 MG capsule Take 40 mg by mouth daily before breakfast.   . oxyCODONE-acetaminophen (ROXICET) 5-325 MG tablet Take 1-2 tablets every 4 (four) hours as needed by mouth for severe pain.  Marland Kitchen. senna (SENOKOT) 8.6 MG TABS tablet Take 1 tablet by mouth daily.  . sertraline (ZOLOFT) 100 MG tablet Take 100 mg by mouth 2 (two) times daily.   . simvastatin (ZOCOR) 40 MG tablet Take 20 mg by mouth daily. TAKES 0.5 TABLET      Allergies:   Coconut flavor; Coconut oil; Cephalosporins; Morphine and related; Sulfa antibiotics; Sulfasalazine; Ketorolac; Cefuroxime axetil; and Estrogens   Social History   Socioeconomic History  . Marital status: Married    Spouse name: Not on file  . Number of children: Not on file  . Years of education: Not on file  . Highest education level: Not on file  Occupational History  . Not on file    Social Needs  . Financial resource strain: Not on file  . Food insecurity:    Worry: Not on file    Inability: Not on file  . Transportation needs:    Medical: Not on file    Non-medical: Not on file  Tobacco Use  . Smoking status: Former Games developermoker  . Smokeless tobacco: Never Used  . Tobacco comment: quit smoking 8 yrs ago  Substance and Sexual Activity  . Alcohol use: No  . Drug use: No  . Sexual activity: Not on file  Lifestyle  . Physical activity:    Days per week: Not on file    Minutes per session: Not on file  . Stress: Not on file  Relationships  . Social connections:    Talks on phone: Not on file    Gets together: Not on file    Attends  religious service: Not on file    Active member of club or organization: Not on file    Attends meetings of clubs or organizations: Not on file    Relationship status: Not on file  Other Topics Concern  . Not on file  Social History Narrative  . Not on file     Family History:  The patient's family history includes Emphysema in her father; Heart disease in her mother; Pneumonia in her father; Stroke in her maternal grandmother and mother.  ROS:   Please see the history of present illness.  All other systems are reviewed and otherwise negative.    PHYSICAL EXAM:   VS:  BP 128/74 (BP Location: Right Arm)   Pulse 60   Ht 4' 9.5" (1.461 m)   Wt 215 lb (97.5 kg)   SpO2 95%   BMI 45.72 kg/m   BMI: Body mass index is 45.72 kg/m. GEN: Well nourished, well developed obese WF, in no acute distress HEENT: normocephalic, atraumatic Neck: no JVD, carotid bruits, or masses Cardiac: RRR; no murmurs, rubs, or gallops, trace BLE pedal edema suspect venous insufficiency superimposed on large body habitus Respiratory:  clear to auscultation bilaterally, normal work of breathing GI: soft, nontender, nondistended, + BS MS: no deformity or atrophy Skin: warm and dry, no rash Neuro:  Alert and Oriented x 3, Strength and sensation are  intact, follows commands Psych: euthymic mood, full affect  Wt Readings from Last 3 Encounters:  05/07/18 215 lb (97.5 kg)  04/02/18 214 lb (97.1 kg)  02/27/17 206 lb (93.4 kg)      Studies/Labs Reviewed:   EKG:  EKG was ordered today and personally reviewed by me and demonstrates NSR 63bpm, RSR V1, low voltage, poor R wave progression  Recent Labs: No results found for requested labs within last 8760 hours.   Lipid Panel No results found for: CHOL, TRIG, HDL, CHOLHDL, VLDL, LDLCALC, LDLDIRECT  Additional studies/ records that were reviewed today include: Summarized above   ASSESSMENT & PLAN:   1. Chest tightness/DOE concerning for angina pectoris - agree with need for [facility] as suggested in Dr. Verna Czech note. Have updated him on plan. Risks and benefits of cardiac catheterization have been discussed with the patient.  These include bleeding, infection, kidney damage, stroke, heart attack, death.  The patient understands these risks and is willing to proceed. Will start Imdur 15mg  daily to assess anti-anginal response. She is concerned about risk of headache so prefers lower dose. HR 55 on exam so will hold off addition of BB. Continue ASA. I told her if symptoms begin to escalate, worsen in intensity, become more frequent or if she has any symptoms at rest to call 911/proceed to ER. She verbalized understanding. Will check pre-cath CMET and CBC. See below re: statin. 2. Essential HTN - controlled. Follow BP with addition of Imdur. 3. Hyperlipidemia - If coronary disease is found, will need to consider titration of statin. We do not have any recent lipids on file but these are followed by PCP. She just had lunch so results may be inaccurate given her hx of DM if checked non-fasting. Will at least get the LFTs on file so that we can escalate statin if needed. We can consider lipid profile in follow-up contingent on cath report. 4. Morbid obesity - discussed importance of long term weight  loss.  Disposition: F/u with APP in 2-3 weeks for post-cath f/u.  Medication Adjustments/Labs and Tests Ordered: Current medicines are reviewed at length with  the patient today.  Concerns regarding medicines are outlined above. Medication changes, Labs and Tests ordered today are summarized above and listed in the Patient Instructions accessible in Encounters.   Signed, Laurann Montana, PA-C  05/07/2018 2:46 PM    [facility] [place] [facility] in [facility] [address] [place], [place] [address] Ph: [phone]; Fax [phone]

## 2018-05-06 NOTE — Progress Notes (Addendum)
 Cardiology Office Note    Date:  05/07/2018  ID:  Lynn Dixon, DOB 08/27/1950, MRN [id number] PCP:  Howard, Kevin, MD  Cardiologist:  Branch, Jonathan, MD  Chief Complaint: discuss echo results  History of Present Illness:  Lynn Dixon is a 67 y.o. female with history of HTN, HLD, former tobacco x 40 years, COPD, allergies, arthritis, depression, diet controlled DM, GERD, glaucoma, hiatal hernia, OSA on CPAP, morbid obesity, ? chronic anemia/thrombocytopenia who presents for f/u. She was seen 04/02/18 as new consult for intermittent chest tightness and dyspnea on exertion that started over the summer, associated with diaphoresis. CT PE 02/2018 showed no PE per PCP report. 03/29/2018 [facility]  nuclear stress test ordered by pcp: fixed distal anterior/apical defect, mild basal anterior ischemia, LVEF 48% per nuclear. Dr. Branch recommended echocardiogram then reassessment to discuss possible R & L heart cath. Echo 04/05/18 showed EF 55-60%, indeterminate diastolic function, PASP could not be accurately estimated, prominent pericardial fat pad. No recent labs on file. 02/2017 CBC showed Hgb 9.9, plt 129, K 4.3, glucose 129, Cr 0.85, LFTs ok - obtained during admit for knee surgery. She does appear to have some degree of baseline anemia/thrombocytopenia prior to surgery (Hgb 11.4, plt 116).  She returns for follow-up today to discuss above echo reports. Her exertional symptoms remain similar to before, described as tightness, dyspnea and diaphoresis with exertion such as running the vacuum or exerting herself. She has not had any symptoms at rest and feels well in the office today. Denies any hx of bleeding. Reports a remote cath 15 yrs ago with some sort of blockage but I do not have those results. Takes Lasix PRN.  Past Medical History:  Diagnosis Date  . Abnormal stress test   . Allergy   . Arthritis    BACK  . Chronic neck pain   . Constipation    takes Sennokot daily  . COPD  (chronic obstructive pulmonary disease) (HCC)   . Depression    PTSD-takes Zoloft daily  . Diabetes mellitus without complication (HCC)    diet controled  . GERD (gastroesophageal reflux disease)   . Glaucoma    mild in left eye  . H/O hiatal hernia   . History of blood transfusion   . History of bronchitis   . History of colon polyps    benign  . Hyperlipidemia   . Hypertension   . Morbid obesity (HCC)   . Sleep apnea    USES CPAP  . Weakness    numbness and tingling. Takes Gabapentin daily    Past Surgical History:  Procedure Laterality Date  . ANTERIOR CERVICAL DECOMP/DISCECTOMY FUSION N/A 07/23/2016   Procedure: CERVICAL THREE-FOUR, CERVICAL FOUR-FIVE, CERVICAL FIVE-SIX ANTERIOR CERVICAL DECOMPRESSION/DISCECTOMY FUSION;  Surgeon: Henry Pool, MD;  Location: [facility] OR;  Service: Neurosurgery;  Laterality: N/A;  . APPENDECTOMY    . BACK SURGERY     x 4  . BREAST REDUCTION SURGERY    . CARPEL TUNNEL Bilateral   . CATARACT EXTRACTION, BILATERAL  spring 2018   Dr. Hechler  . CHOLECYSTECTOMY    . COLONOSCOPY    . ESOPHAGOGASTRODUODENOSCOPY    . LUMBAR LAMINECTOMY/DECOMPRESSION MICRODISCECTOMY Left 12/03/2012   Procedure: Left Lumbar Four to Five Diskectomy;  Surgeon: Ernesto M Botero, MD;  Location: [facility] OR[facility];  Service: Neurosurgery;  Laterality: Left;  LUMBAR LAMINECTOMY/DECOMPRESSION MICRODISCECTOMY 1 LEVEL  . TOTAL KNEE ARTHROPLASTY Right   . TOTAL KNEE ARTHROPLASTY Left 02/27/2017   Procedure: LEFT TOTAL   KNEE ARTHROPLASTY;  Surgeon: Frederico Hammanaffrey, Daniel, MD;  [facility];  Service: Orthopedics;  Laterality: Left;    Current Medications: Current Meds  Medication Sig  . albuterol (PROVENTIL HFA;VENTOLIN HFA) 108 (90 Base) MCG/ACT inhaler Inhale 2 puffs into the lungs every 6 (six) hours as needed for wheezing or shortness of breath.  . ARIPiprazole (ABILIFY) 2 MG tablet Take 2 mg by mouth daily.  Marland Kitchen. aspirin EC 81 MG tablet Take 81 mg by mouth daily.  . budesonide-formoterol  (SYMBICORT) 160-4.5 MCG/ACT inhaler Inhale 2 puffs into the lungs 2 (two) times daily. (Patient taking differently: Inhale 2 puffs into the lungs 2 (two) times daily as needed. )  . Calcium Carbonate-Vitamin D (CALCIUM 500/D PO) Take 1 tablet by mouth daily.  . cetirizine (ZYRTEC) 10 MG tablet Take 10 mg by mouth daily.  . cholecalciferol (VITAMIN D) 1000 units tablet Take 1,000 Units by mouth daily.  . furosemide (LASIX) 40 MG tablet Take 40 mg by mouth daily.  Marland Kitchen. gabapentin (NEURONTIN) 300 MG capsule Take 900 mg by mouth 2 (two) times daily.   Marland Kitchen. GLUCOSAMINE-CHONDROITIN PO Take 1 tablet by mouth daily.  Marland Kitchen. lisinopril (PRINIVIL,ZESTRIL) 40 MG tablet Take 20 mg by mouth daily.   . Melatonin 3 MG TABS Take 6 mg by mouth at bedtime.   . montelukast (SINGULAIR) 10 MG tablet Take 10 mg by mouth daily.   . Multiple Vitamin (MULTIVITAMIN WITH MINERALS) TABS tablet Take 1 tablet by mouth daily.  . Omega-3 Fatty Acids (FISH OIL) 1000 MG CAPS Take 1,000-2,000 mg by mouth See admin instructions. Takes 2 tablets in the morning in the morning and 1 tablet in the evening  . omeprazole (PRILOSEC) 20 MG capsule Take 40 mg by mouth daily before breakfast.   . oxyCODONE-acetaminophen (ROXICET) 5-325 MG tablet Take 1-2 tablets every 4 (four) hours as needed by mouth for severe pain.  Marland Kitchen. senna (SENOKOT) 8.6 MG TABS tablet Take 1 tablet by mouth daily.  . sertraline (ZOLOFT) 100 MG tablet Take 100 mg by mouth 2 (two) times daily.   . simvastatin (ZOCOR) 40 MG tablet Take 20 mg by mouth daily. TAKES 0.5 TABLET      Allergies:   Coconut flavor; Coconut oil; Cephalosporins; Morphine and related; Sulfa antibiotics; Sulfasalazine; Ketorolac; Cefuroxime axetil; and Estrogens   Social History   Socioeconomic History  . Marital status: Married    Spouse name: Not on file  . Number of children: Not on file  . Years of education: Not on file  . Highest education level: Not on file  Occupational History  . Not on file    Social Needs  . Financial resource strain: Not on file  . Food insecurity:    Worry: Not on file    Inability: Not on file  . Transportation needs:    Medical: Not on file    Non-medical: Not on file  Tobacco Use  . Smoking status: Former Games developermoker  . Smokeless tobacco: Never Used  . Tobacco comment: quit smoking 8 yrs ago  Substance and Sexual Activity  . Alcohol use: No  . Drug use: No  . Sexual activity: Not on file  Lifestyle  . Physical activity:    Days per week: Not on file    Minutes per session: Not on file  . Stress: Not on file  Relationships  . Social connections:    Talks on phone: Not on file    Gets together: Not on file    Attends  religious service: Not on file    Active member of club or organization: Not on file    Attends meetings of clubs or organizations: Not on file    Relationship status: Not on file  Other Topics Concern  . Not on file  Social History Narrative  . Not on file     Family History:  The patient's family history includes Emphysema in her father; Heart disease in her mother; Pneumonia in her father; Stroke in her maternal grandmother and mother.  ROS:   Please see the history of present illness.  All other systems are reviewed and otherwise negative.    PHYSICAL EXAM:   VS:  BP 128/74 (BP Location: Right Arm)   Pulse 60   Ht 4' 9.5" (1.461 m)   Wt 215 lb (97.5 kg)   SpO2 95%   BMI 45.72 kg/m   BMI: Body mass index is 45.72 kg/m. GEN: Well nourished, well developed obese WF, in no acute distress HEENT: normocephalic, atraumatic Neck: no JVD, carotid bruits, or masses Cardiac: RRR; no murmurs, rubs, or gallops, trace BLE pedal edema suspect venous insufficiency superimposed on large body habitus Respiratory:  clear to auscultation bilaterally, normal work of breathing GI: soft, nontender, nondistended, + BS MS: no deformity or atrophy Skin: warm and dry, no rash Neuro:  Alert and Oriented x 3, Strength and sensation are  intact, follows commands Psych: euthymic mood, full affect  Wt Readings from Last 3 Encounters:  05/07/18 215 lb (97.5 kg)  04/02/18 214 lb (97.1 kg)  02/27/17 206 lb (93.4 kg)      Studies/Labs Reviewed:   EKG:  EKG was ordered today and personally reviewed by me and demonstrates NSR 63bpm, RSR V1, low voltage, poor R wave progression  Recent Labs: No results found for requested labs within last 8760 hours.   Lipid Panel No results found for: CHOL, TRIG, HDL, CHOLHDL, VLDL, LDLCALC, LDLDIRECT  Additional studies/ records that were reviewed today include: Summarized above   ASSESSMENT & PLAN:   1. Chest tightness/DOE concerning for angina pectoris - agree with need for [facility] as suggested in Dr. Verna Czech note. Have updated him on plan. Risks and benefits of cardiac catheterization have been discussed with the patient.  These include bleeding, infection, kidney damage, stroke, heart attack, death.  The patient understands these risks and is willing to proceed. Will start Imdur 15mg  daily to assess anti-anginal response. She is concerned about risk of headache so prefers lower dose. HR 55 on exam so will hold off addition of BB. Continue ASA. I told her if symptoms begin to escalate, worsen in intensity, become more frequent or if she has any symptoms at rest to call 911/proceed to ER. She verbalized understanding. Will check pre-cath CMET and CBC. See below re: statin. 2. Essential HTN - controlled. Follow BP with addition of Imdur. 3. Hyperlipidemia - If coronary disease is found, will need to consider titration of statin. We do not have any recent lipids on file but these are followed by PCP. She just had lunch so results may be inaccurate given her hx of DM if checked non-fasting. Will at least get the LFTs on file so that we can escalate statin if needed. We can consider lipid profile in follow-up contingent on cath report. 4. Morbid obesity - discussed importance of long term weight  loss.  Disposition: F/u with APP in 2-3 weeks for post-cath f/u.  Medication Adjustments/Labs and Tests Ordered: Current medicines are reviewed at length with  the patient today.  Concerns regarding medicines are outlined above. Medication changes, Labs and Tests ordered today are summarized above and listed in the Patient Instructions accessible in Encounters.   Signed, Laurann Montana, PA-C  05/07/2018 2:46 PM    [facility] [place] [facility] in [facility] [address] [place], [place] [address] Ph: [phone]; Fax [phone]

## 2018-05-07 ENCOUNTER — Other Ambulatory Visit (HOSPITAL_COMMUNITY)
Admission: RE | Admit: 2018-05-07 | Discharge: 2018-05-07 | Disposition: A | Discharge status: Home / Self Care | Payer: Managed Care, Other (non HMO) | Source: Ambulatory Visit | Attending: Physician Assistant | Admitting: Physician Assistant

## 2018-05-07 ENCOUNTER — Encounter: Payer: Self-pay | Admitting: Physician Assistant

## 2018-05-07 ENCOUNTER — Ambulatory Visit (INDEPENDENT_AMBULATORY_CARE_PROVIDER_SITE_OTHER): Payer: Managed Care, Other (non HMO) | Admitting: Physician Assistant

## 2018-05-07 VITALS — BP 128/74 | HR 60 | Ht <= 58 in | Wt 215.0 lb

## 2018-05-07 DIAGNOSIS — E785 Hyperlipidemia, unspecified: Secondary | ICD-10-CM | POA: Diagnosis not present

## 2018-05-07 DIAGNOSIS — I10 Essential (primary) hypertension: Secondary | ICD-10-CM | POA: Diagnosis not present

## 2018-05-07 DIAGNOSIS — I209 Angina pectoris, unspecified: Secondary | ICD-10-CM

## 2018-05-07 DIAGNOSIS — Z01818 Encounter for other preprocedural examination: Secondary | ICD-10-CM | POA: Diagnosis present

## 2018-05-07 LAB — CBC WITH DIFFERENTIAL/PLATELET
ABS IMMATURE GRANULOCYTES: 0.02 10*3/uL (ref 0.00–0.07)
BASOS PCT: 1 %
Basophils Absolute: 0 10*3/uL (ref 0.0–0.1)
Eosinophils Absolute: 0.1 10*3/uL (ref 0.0–0.5)
Eosinophils Relative: 2 %
HCT: 37.1 % (ref 36.0–46.0)
Hemoglobin: 11.8 g/dL — ABNORMAL LOW (ref 12.0–15.0)
Immature Granulocytes: 0 %
Lymphocytes Relative: 26 %
Lymphs Abs: 1.7 10*3/uL (ref 0.7–4.0)
MCH: 28.2 pg (ref 26.0–34.0)
MCHC: 31.8 g/dL (ref 30.0–36.0)
MCV: 88.5 fL (ref 80.0–100.0)
Monocytes Absolute: 0.5 10*3/uL (ref 0.1–1.0)
Monocytes Relative: 8 %
NEUTROS ABS: 4.2 10*3/uL (ref 1.7–7.7)
Neutrophils Relative %: 63 %
PLATELETS: 128 10*3/uL — AB (ref 150–400)
RBC: 4.19 MIL/uL (ref 3.87–5.11)
RDW: 13.6 % (ref 11.5–15.5)
WBC: 6.6 10*3/uL (ref 4.0–10.5)
nRBC: 0 % (ref 0.0–0.2)

## 2018-05-07 LAB — COMPREHENSIVE METABOLIC PANEL
ALT: 36 U/L (ref 0–44)
ANION GAP: 9 (ref 5–15)
AST: 40 U/L (ref 15–41)
Albumin: 4.3 g/dL (ref 3.5–5.0)
Alkaline Phosphatase: 56 U/L (ref 38–126)
BUN: 24 mg/dL — ABNORMAL HIGH (ref 8–23)
CO2: 23 mmol/L (ref 22–32)
Calcium: 9.3 mg/dL (ref 8.9–10.3)
Chloride: 108 mmol/L (ref 98–111)
Creatinine, Ser: 0.85 mg/dL (ref 0.44–1.00)
GFR calc Af Amer: >60 mL/min (ref >60)
Glucose, Bld: 96 mg/dL (ref 70–99)
Potassium: 4.4 mmol/L (ref 3.5–5.1)
Sodium: 140 mmol/L (ref 135–145)
Total Bilirubin: 0.7 mg/dL (ref 0.3–1.2)
Total Protein: 7.4 g/dL (ref 6.5–8.1)

## 2018-05-07 MED ORDER — NITROGLYCERIN 0.4 MG SL SUBL: 0.4000 mg | SUBLINGUAL_TABLET | SUBLINGUAL | 3 refills | Status: DC | PRN

## 2018-05-07 MED ORDER — ISOSORBIDE MONONITRATE ER 30 MG PO TB24: 15.0000 mg | ORAL_TABLET | Freq: Every day | ORAL | 3 refills | Status: DC

## 2018-05-07 NOTE — Patient Instructions (Addendum)
Medication Instructions:  START Imdur 15 mg daily  TAKE NTG AS DIRECTED  If you need a refill on your cardiac medications before your next appointment, please call your pharmacy.   Lab work: Automotive engineer, bmet If you have labs (blood work) drawn today and your tests are completely normal, you will receive your results only by: Marland Kitchen MyChart Message (if you have MyChart) OR . A paper copy in the mail If you have any lab test that is abnormal or we need to change your treatment, we will call you to review the results.  Testing/Procedures: Heart cath to be scheduled  Follow-Up: At [facility], you and your health needs are our priority.  As part of our continuing mission to provide you with exceptional heart care, we have created designated Provider Care Teams.  These Care Teams include your primary Cardiologist (physician) and Advanced Practice Providers (APPs -  Physician Assistants and Nurse Practitioners) who all work together to provide you with the care you need, when you need it. You will need a follow up appointment in 3 weeks.  Please call our office 2 months in advance to schedule this appointment.  You may see Dina Rich, MD or one of the following Advanced Practice Providers on your designated Care Team:   Randall An, PA-C [facility]) . Jacolyn Reedy, PA-C [facility] Office)  Any Other Special Instructions Will Be Listed Below (If Applicable). See cath instructions attached  Nitroglycerin sublingual tablets What is this medicine? NITROGLYCERIN (nye troe GLI ser in) is a type of vasodilator. It relaxes blood vessels, increasing the blood and oxygen supply to your heart. This medicine is used to relieve chest pain caused by angina. It is also used to prevent chest pain before activities like climbing stairs, going outdoors in cold weather, or sexual activity. This medicine may be used for other purposes; ask your health care provider or pharmacist if you have  questions. COMMON BRAND NAME(S): Nitroquick, Nitrostat, Nitrotab What should I tell my health care provider before I take this medicine? They need to know if you have any of these conditions: -anemia -head injury, recent stroke, or bleeding in the brain -liver disease -previous heart attack -an unusual or allergic reaction to nitroglycerin, other medicines, foods, dyes, or preservatives -pregnant or trying to get pregnant -breast-feeding How should I use this medicine? Take this medicine by mouth as needed. At the first sign of an angina attack (chest pain or tightness) place one tablet under your tongue. You can also take this medicine 5 to 10 minutes before an event likely to produce chest pain. Follow the directions on the prescription label. Let the tablet dissolve under the tongue. Do not swallow whole. Replace the dose if you accidentally swallow it. It will help if your mouth is not dry. Saliva around the tablet will help it to dissolve more quickly. Do not eat or drink, smoke or chew tobacco while a tablet is dissolving. If you are not better within 5 minutes after taking ONE dose of nitroglycerin, call 9-1-1 immediately to seek emergency medical care. Do not take more than 3 nitroglycerin tablets over 15 minutes. If you take this medicine often to relieve symptoms of angina, your doctor or health care professional may provide you with different instructions to manage your symptoms. If symptoms do not go away after following these instructions, it is important to call 9-1-1 immediately. Do not take more than 3 nitroglycerin tablets over 15 minutes. Talk to your pediatrician regarding the use of  this medicine in children. Special care may be needed. Overdosage: If you think you have taken too much of this medicine contact a poison control center or emergency room at once. NOTE: This medicine is only for you. Do not share this medicine with others. What if I miss a dose? This does not apply.  This medicine is only used as needed. What may interact with this medicine? Do not take this medicine with any of the following medications: -certain migraine medicines like ergotamine and dihydroergotamine (DHE) -medicines used to treat erectile dysfunction like sildenafil, tadalafil, and vardenafil -riociguat This medicine may also interact with the following medications: -alteplase -aspirin -heparin -medicines for high blood pressure -medicines for mental depression -other medicines used to treat angina -phenothiazines like chlorpromazine, mesoridazine, prochlorperazine, thioridazine This list may not describe all possible interactions. Give your health care provider a list of all the medicines, herbs, non-prescription drugs, or dietary supplements you use. Also tell them if you smoke, drink alcohol, or use illegal drugs. Some items may interact with your medicine. What should I watch for while using this medicine? Tell your doctor or health care professional if you feel your medicine is no longer working. Keep this medicine with you at all times. Sit or lie down when you take your medicine to prevent falling if you feel dizzy or faint after using it. Try to remain calm. This will help you to feel better faster. If you feel dizzy, take several deep breaths and lie down with your feet propped up, or bend forward with your head resting between your knees. You may get drowsy or dizzy. Do not drive, use machinery, or do anything that needs mental alertness until you know how this drug affects you. Do not stand or sit up quickly, especially if you are an older patient. This reduces the risk of dizzy or fainting spells. Alcohol can make you more drowsy and dizzy. Avoid alcoholic drinks. Do not treat yourself for coughs, colds, or pain while you are taking this medicine without asking your doctor or health care professional for advice. Some ingredients may increase your blood pressure. What side  effects may I notice from receiving this medicine? Side effects that you should report to your doctor or health care professional as soon as possible: -blurred vision -dry mouth -skin rash -sweating -the feeling of extreme pressure in the head -unusually weak or tired Side effects that usually do not require medical attention (report to your doctor or health care professional if they continue or are bothersome): -flushing of the face or neck -headache -irregular heartbeat, palpitations -nausea, vomiting This list may not describe all possible side effects. Call your doctor for medical advice about side effects. You may report side effects to FDA at 1-800-FDA-1088. Where should I keep my medicine? Keep out of the reach of children. Store at room temperature between 20 and 25 degrees C (68 and 77 degrees F). Store in Retail buyer. Protect from light and moisture. Keep tightly closed. Throw away any unused medicine after the expiration date. NOTE: This sheet is a summary. It may not cover all possible information. If you have questions about this medicine, talk to your doctor, pharmacist, or health care provider.  2019 Elsevier/Gold Standard (2013-01-27 17:57:36)

## 2018-05-11 ENCOUNTER — Telehealth: Payer: Self-pay | Admitting: *Deleted

## 2018-05-11 NOTE — Telephone Encounter (Signed)
Pt contacted pre-catheterization scheduled at [facility] for: Wednesday May 12, 2018 8:30 AM Verified arrival time and place: [facility] Main Entrance A at: 6:30 AM  No solid food after midnight prior to cath, clear liquids until 5 AM day of procedure. Contrast allergy: no Verified no diabetes medications.  Hold: Lasix-AM of procedure.  Except hold medications AM meds can be  taken pre-cath with sip of water including: ASA 81 mg  Confirmed patient has responsible person to drive home post procedure and for 24 hours after you arrive home: yes

## 2018-05-12 ENCOUNTER — Ambulatory Visit (HOSPITAL_COMMUNITY)
Admission: RE | Disposition: A | Discharge status: Home / Self Care | Payer: Self-pay | Source: Home / Self Care | Attending: Cardiovascular Disease

## 2018-05-12 ENCOUNTER — Ambulatory Visit (HOSPITAL_COMMUNITY)
Admission: RE | Admit: 2018-05-12 | Discharge: 2018-05-12 | Disposition: A | Discharge status: Home / Self Care | Payer: Managed Care, Other (non HMO) | Attending: Cardiovascular Disease | Admitting: Cardiovascular Disease

## 2018-05-12 ENCOUNTER — Other Ambulatory Visit: Payer: Self-pay

## 2018-05-12 DIAGNOSIS — J449 Chronic obstructive pulmonary disease, unspecified: Secondary | ICD-10-CM | POA: Diagnosis not present

## 2018-05-12 DIAGNOSIS — Z882 Allergy status to sulfonamides status: Secondary | ICD-10-CM | POA: Diagnosis not present

## 2018-05-12 DIAGNOSIS — I10 Essential (primary) hypertension: Secondary | ICD-10-CM | POA: Insufficient documentation

## 2018-05-12 DIAGNOSIS — Z87891 Personal history of nicotine dependence: Secondary | ICD-10-CM | POA: Insufficient documentation

## 2018-05-12 DIAGNOSIS — Z79899 Other long term (current) drug therapy: Secondary | ICD-10-CM | POA: Insufficient documentation

## 2018-05-12 DIAGNOSIS — Z6841 Body Mass Index (BMI) 40.0 and over, adult: Secondary | ICD-10-CM | POA: Diagnosis not present

## 2018-05-12 DIAGNOSIS — H409 Unspecified glaucoma: Secondary | ICD-10-CM | POA: Diagnosis not present

## 2018-05-12 DIAGNOSIS — I272 Pulmonary hypertension, unspecified: Secondary | ICD-10-CM | POA: Insufficient documentation

## 2018-05-12 DIAGNOSIS — R9439 Abnormal result of other cardiovascular function study: Secondary | ICD-10-CM | POA: Diagnosis not present

## 2018-05-12 DIAGNOSIS — Z885 Allergy status to narcotic agent status: Secondary | ICD-10-CM | POA: Diagnosis not present

## 2018-05-12 DIAGNOSIS — I209 Angina pectoris, unspecified: Secondary | ICD-10-CM | POA: Insufficient documentation

## 2018-05-12 DIAGNOSIS — R0609 Other forms of dyspnea: Secondary | ICD-10-CM | POA: Diagnosis present

## 2018-05-12 DIAGNOSIS — G4730 Sleep apnea, unspecified: Secondary | ICD-10-CM | POA: Insufficient documentation

## 2018-05-12 DIAGNOSIS — E785 Hyperlipidemia, unspecified: Secondary | ICD-10-CM | POA: Insufficient documentation

## 2018-05-12 DIAGNOSIS — E119 Type 2 diabetes mellitus without complications: Secondary | ICD-10-CM | POA: Insufficient documentation

## 2018-05-12 DIAGNOSIS — Z8249 Family history of ischemic heart disease and other diseases of the circulatory system: Secondary | ICD-10-CM | POA: Diagnosis not present

## 2018-05-12 DIAGNOSIS — Z881 Allergy status to other antibiotic agents status: Secondary | ICD-10-CM | POA: Diagnosis not present

## 2018-05-12 DIAGNOSIS — Z7982 Long term (current) use of aspirin: Secondary | ICD-10-CM | POA: Insufficient documentation

## 2018-05-12 DIAGNOSIS — K219 Gastro-esophageal reflux disease without esophagitis: Secondary | ICD-10-CM | POA: Insufficient documentation

## 2018-05-12 DIAGNOSIS — Z888 Allergy status to other drugs, medicaments and biological substances status: Secondary | ICD-10-CM | POA: Insufficient documentation

## 2018-05-12 DIAGNOSIS — Z823 Family history of stroke: Secondary | ICD-10-CM | POA: Diagnosis not present

## 2018-05-12 DIAGNOSIS — M1990 Unspecified osteoarthritis, unspecified site: Secondary | ICD-10-CM | POA: Diagnosis not present

## 2018-05-12 HISTORY — PX: RIGHT/LEFT HEART CATH AND CORONARY ANGIOGRAPHY: CATH118266

## 2018-05-12 LAB — POCT I-STAT 7, (LYTES, BLD GAS, ICA,H+H)
Acid-base deficit: 4 mmol/L — ABNORMAL HIGH (ref 0.0–2.0)
BICARBONATE: 21.1 mmol/L (ref 20.0–28.0)
Calcium, Ion: 1.25 mmol/L (ref 1.15–1.40)
HCT: 29 % — ABNORMAL LOW (ref 36.0–46.0)
Hemoglobin: 9.9 g/dL — ABNORMAL LOW (ref 12.0–15.0)
O2 SAT: 98 %
Potassium: 4.3 mmol/L (ref 3.5–5.1)
Sodium: 142 mmol/L (ref 135–145)
TCO2: 22 mmol/L (ref 22–32)
pCO2 arterial: 39 mmHg (ref 32.0–48.0)
pH, Arterial: 7.341 — ABNORMAL LOW (ref 7.350–7.450)
pO2, Arterial: 118 mmHg — ABNORMAL HIGH (ref 83.0–108.0)

## 2018-05-12 LAB — POCT I-STAT EG7
ACID-BASE DEFICIT: 4 mmol/L — AB (ref 0.0–2.0)
Acid-base deficit: 4 mmol/L — ABNORMAL HIGH (ref 0.0–2.0)
Bicarbonate: 22.2 mmol/L (ref 20.0–28.0)
Bicarbonate: 22.2 mmol/L (ref 20.0–28.0)
Calcium, Ion: 1.26 mmol/L (ref 1.15–1.40)
Calcium, Ion: 1.29 mmol/L (ref 1.15–1.40)
HCT: 28 % — ABNORMAL LOW (ref 36.0–46.0)
HCT: 29 % — ABNORMAL LOW (ref 36.0–46.0)
Hemoglobin: 9.5 g/dL — ABNORMAL LOW (ref 12.0–15.0)
Hemoglobin: 9.9 g/dL — ABNORMAL LOW (ref 12.0–15.0)
O2 Saturation: 64 %
O2 Saturation: 65 %
PO2 VEN: 37 mmHg (ref 32.0–45.0)
POTASSIUM: 4.1 mmol/L (ref 3.5–5.1)
Potassium: 4.2 mmol/L (ref 3.5–5.1)
Sodium: 142 mmol/L (ref 135–145)
Sodium: 142 mmol/L (ref 135–145)
TCO2: 24 mmol/L (ref 22–32)
TCO2: 24 mmol/L (ref 22–32)
pCO2, Ven: 44.7 mmHg (ref 44.0–60.0)
pCO2, Ven: 44.8 mmHg (ref 44.0–60.0)
pH, Ven: 7.303 (ref 7.250–7.430)
pH, Ven: 7.303 (ref 7.250–7.430)
pO2, Ven: 37 mmHg (ref 32.0–45.0)

## 2018-05-12 LAB — CARDIAC CATHETERIZATION: Cath EF Quantitative: 55 %

## 2018-05-12 LAB — GLUCOSE, CAPILLARY: Glucose-Capillary: 92 mg/dL (ref 70–99)

## 2018-05-12 SURGERY — RIGHT/LEFT HEART CATH AND CORONARY ANGIOGRAPHY
Anesthesia: LOCAL

## 2018-05-12 MED ORDER — VERAPAMIL HCL 2.5 MG/ML IV SOLN
INTRAVENOUS | Status: AC
  Filled 2018-05-12: qty 2

## 2018-05-12 MED ORDER — HEPARIN (PORCINE) IN NACL 1000-0.9 UT/500ML-% IV SOLN
INTRAVENOUS | Status: AC
  Filled 2018-05-12: qty 1000

## 2018-05-12 MED ORDER — HEPARIN (PORCINE) IN NACL 1000-0.9 UT/500ML-% IV SOLN
INTRAVENOUS | Status: DC | PRN
  Administered 2018-05-12: 500 mL

## 2018-05-12 MED ORDER — MIDAZOLAM HCL 2 MG/2ML IJ SOLN
INTRAMUSCULAR | Status: AC
  Filled 2018-05-12: qty 2

## 2018-05-12 MED ORDER — MIDAZOLAM HCL 2 MG/2ML IJ SOLN
INTRAMUSCULAR | Status: DC | PRN
  Administered 2018-05-12 (×2): 1 mg via INTRAVENOUS

## 2018-05-12 MED ORDER — SODIUM CHLORIDE 0.9% FLUSH: 3.0000 mL | Freq: Two times a day (BID) | INTRAVENOUS | Status: DC

## 2018-05-12 MED ORDER — FENTANYL CITRATE (PF) 100 MCG/2ML IJ SOLN
INTRAMUSCULAR | Status: AC
  Filled 2018-05-12: qty 2

## 2018-05-12 MED ORDER — SODIUM CHLORIDE 0.9 % IV SOLN: INTRAVENOUS | Status: DC

## 2018-05-12 MED ORDER — HEPARIN SODIUM (PORCINE) 1000 UNIT/ML IJ SOLN
INTRAMUSCULAR | Status: AC
  Filled 2018-05-12: qty 1

## 2018-05-12 MED ORDER — SODIUM CHLORIDE 0.9% FLUSH: 3.0000 mL | INTRAVENOUS | Status: DC | PRN

## 2018-05-12 MED ORDER — IOHEXOL 350 MG/ML SOLN
INTRAVENOUS | Status: DC | PRN
  Administered 2018-05-12: 70 mL via INTRA_ARTERIAL

## 2018-05-12 MED ORDER — SODIUM CHLORIDE 0.9 % WEIGHT BASED INFUSION: 1.0000 mL/kg/h | INTRAVENOUS | Status: DC

## 2018-05-12 MED ORDER — LIDOCAINE HCL (PF) 1 % IJ SOLN
INTRAMUSCULAR | Status: DC | PRN
  Administered 2018-05-12: 15 mL
  Administered 2018-05-12 (×2): 2 mL

## 2018-05-12 MED ORDER — SODIUM CHLORIDE 0.9 % IV SOLN: 250.0000 mL | INTRAVENOUS | Status: DC | PRN

## 2018-05-12 MED ORDER — ASPIRIN 81 MG PO CHEW: 81.0000 mg | CHEWABLE_TABLET | ORAL | Status: DC

## 2018-05-12 MED ORDER — FENTANYL CITRATE (PF) 100 MCG/2ML IJ SOLN
INTRAMUSCULAR | Status: DC | PRN
  Administered 2018-05-12 (×2): 25 ug via INTRAVENOUS

## 2018-05-12 MED ORDER — LIDOCAINE HCL (PF) 1 % IJ SOLN
INTRAMUSCULAR | Status: AC
  Filled 2018-05-12: qty 30

## 2018-05-12 MED ORDER — ACETAMINOPHEN 325 MG PO TABS: 650.0000 mg | ORAL_TABLET | ORAL | Status: DC | PRN

## 2018-05-12 MED ORDER — ONDANSETRON HCL 4 MG/2ML IJ SOLN: 4.0000 mg | Freq: Four times a day (QID) | INTRAMUSCULAR | Status: DC | PRN

## 2018-05-12 MED ORDER — SODIUM CHLORIDE 0.9 % WEIGHT BASED INFUSION
3.0000 mL/kg/h | INTRAVENOUS | Status: AC
  Administered 2018-05-12: 3 mL/kg/h via INTRAVENOUS

## 2018-05-12 SURGICAL SUPPLY — 17 items
CATH BALLN WEDGE 5F 110CM (CATHETERS) ×1 IMPLANT
CATH INFINITI 5FR MULTPACK ANG (CATHETERS) ×1 IMPLANT
CLOSURE MYNX CONTROL 5F (Vascular Products) ×1 IMPLANT
GLIDESHEATH SLEND SS 6F .021 (SHEATH) ×1 IMPLANT
GUIDEWIRE .025 260CM (WIRE) ×1 IMPLANT
GUIDEWIRE INQWIRE 1.5J.035X260 (WIRE) IMPLANT
INQWIRE 1.5J .035X260CM (WIRE) ×2
KIT HEART LEFT (KITS) ×2 IMPLANT
NDL PERC 21GX4CM (NEEDLE) IMPLANT
NEEDLE PERC 21GX4CM (NEEDLE) ×2 IMPLANT
PACK CARDIAC CATHETERIZATION (CUSTOM PROCEDURE TRAY) ×2 IMPLANT
SHEATH GLIDE SLENDER 4/5FR (SHEATH) ×1 IMPLANT
SHEATH PINNACLE 5F 10CM (SHEATH) ×1 IMPLANT
SHEATH PROBE COVER 6X72 (BAG) ×1 IMPLANT
TRANSDUCER W/STOPCOCK (MISCELLANEOUS) ×2 IMPLANT
TUBING CIL FLEX 10 FLL-RA (TUBING) ×2 IMPLANT
WIRE EMERALD 3MM-J .035X150CM (WIRE) ×1 IMPLANT

## 2018-05-12 NOTE — Interval H&P Note (Signed)
Cath Lab Visit (complete for each Cath Lab visit)  Clinical Evaluation Leading to the Procedure:   ACS: No.  Non-ACS:    Anginal Classification: CCS III  Anti-ischemic medical therapy: Minimal Therapy (1 class of medications)  Non-Invasive Test Results: Intermediate-risk stress test findings: cardiac mortality 1-3%/year  Prior CABG: No previous CABG      History and Physical Interval Note:  05/12/2018 8:28 AM  Lynn Dixon  has presented today for surgery, with the diagnosis of ua  The various methods of treatment have been discussed with the patient and family. After consideration of risks, benefits and other options for treatment, the patient has consented to  Procedure(s): RIGHT/LEFT HEART CATH AND CORONARY ANGIOGRAPHY (N/A) as a surgical intervention .  The patient's history has been reviewed, patient examined, no change in status, stable for surgery.  I have reviewed the patient's chart and labs.  Questions were answered to the patient's satisfaction.     Nicki Guadalajarahomas Archit Leger

## 2018-05-12 NOTE — Research (Signed)
[facility] Informed Consent   Subject Name: Lynn Dixon  Subject met inclusion and exclusion criteria.  The informed consent form, study requirements and expectations were reviewed with the subject and questions and concerns were addressed prior to the signing of the consent form.  The subject verbalized understanding of the trail requirements.  The subject agreed to participate in the [facility] trial and signed the informed consent.  The informed consent was obtained prior to performance of any protocol-specific procedures for the subject.  A copy of the signed informed consent was given to the subject and a copy was placed in the subject's medical record.  Hedrick,Tammy W 05/12/2018, 0745

## 2018-05-12 NOTE — Discharge Instructions (Signed)
Femoral Site Care °This sheet gives you information about how to care for yourself after your procedure. Your health care provider may also give you more specific instructions. If you have problems or questions, contact your health care provider. °What can I expect after the procedure? °After the procedure, it is common to have: °· Bruising that usually fades within 1-2 weeks. °· Tenderness at the site. °Follow these instructions at home: °Wound care °· Follow instructions from your health care provider about how to take care of your insertion site. Make sure you: °? Wash your hands with soap and water before you change your bandage (dressing). If soap and water are not available, use hand sanitizer. °? Change your dressing as told by your health care provider. °? Leave stitches (sutures), skin glue, or adhesive strips in place. These skin closures may need to stay in place for 2 weeks or longer. If adhesive strip edges start to loosen and curl up, you may trim the loose edges. Do not remove adhesive strips completely unless your health care provider tells you to do that. °· Do not take baths, swim, or use a hot tub until your health care provider approves. °· You may shower 24-48 hours after the procedure or as told by your health care provider. °? Gently wash the site with plain soap and water. °? Pat the area dry with a clean towel. °? Do not rub the site. This may cause bleeding. °· Do not apply powder or lotion to the site. Keep the site clean and dry. °· Check your femoral site every day for signs of infection. Check for: °? Redness, swelling, or pain. °? Fluid or blood. °? Warmth. °? Pus or a bad smell. °Activity °· For the first 2-3 days after your procedure, or as long as directed: °? Avoid climbing stairs as much as possible. °? Do not squat. °· Do not lift anything that is heavier than 10 lb (4.5 kg), or the limit that you are told, until your health care provider says that it is safe. °· Rest as  directed. °? Avoid sitting for a long time without moving. Get up to take short walks every 1-2 hours. °· Do not drive for 24 hours if you were given a medicine to help you relax (sedative). °General instructions °· Take over-the-counter and prescription medicines only as told by your health care provider. °· Keep all follow-up visits as told by your health care provider. This is important. °Contact a health care provider if you have: °· A fever or chills. °· You have redness, swelling, or pain around your insertion site. °Get help right away if: °· The catheter insertion area swells very fast. °· You pass out. °· You suddenly start to sweat or your skin gets clammy. °· The catheter insertion area is bleeding, and the bleeding does not stop when you hold steady pressure on the area. °· The area near or just beyond the catheter insertion site becomes pale, cool, tingly, or numb. °These symptoms may represent a serious problem that is an emergency. Do not wait to see if the symptoms will go away. Get medical help right away. Call your local emergency services (911 in the [place] Do not drive yourself to the hospital. °Summary °· After the procedure, it is common to have bruising that usually fades within 1-2 weeks. °· Check your femoral site every day for signs of infection. °· Do not lift anything that is heavier than 10 lb (4.5 kg), or the   limit that you are told, until your health care provider says that it is safe. °This information is not intended to replace advice given to you by your health care provider. Make sure you discuss any questions you have with your health care provider. °Document Released: 12/02/2013 Document Revised: 04/13/2017 Document Reviewed: 04/13/2017 °Elsevier Interactive Patient Education © 2019 [employer] ° °Moderate Conscious Sedation, Adult, Care After °These instructions provide you with information about caring for yourself after your procedure. Your health care provider may also give  you more specific instructions. Your treatment has been planned according to current medical practices, but problems sometimes occur. Call your health care provider if you have any problems or questions after your procedure. °What can I expect after the procedure? °After your procedure, it is common: °· To feel sleepy for several hours. °· To feel clumsy and have poor balance for several hours. °· To have poor judgment for several hours. °· To vomit if you eat too soon. °Follow these instructions at home: °For at least 24 hours after the procedure: ° °· Do not: °? Participate in activities where you could fall or become injured. °? Drive. °? Use heavy machinery. °? Drink alcohol. °? Take sleeping pills or medicines that cause drowsiness. °? Make important decisions or sign legal documents. °? Take care of children on your own. °· Rest. °Eating and drinking °· Follow the diet recommended by your health care provider. °· If you vomit: °? Drink water, juice, or soup when you can drink without vomiting. °? Make sure you have little or no nausea before eating solid foods. °General instructions °· Have a responsible adult stay with you until you are awake and alert. °· Take over-the-counter and prescription medicines only as told by your health care provider. °· If you smoke, do not smoke without supervision. °· Keep all follow-up visits as told by your health care provider. This is important. °Contact a health care provider if: °· You keep feeling nauseous or you keep vomiting. °· You feel light-headed. °· You develop a rash. °· You have a fever. °Get help right away if: °· You have trouble breathing. °This information is not intended to replace advice given to you by your health care provider. Make sure you discuss any questions you have with your health care provider. °Document Released: 01/19/2013 Document Revised: 09/03/2015 Document Reviewed: 07/21/2015 °Elsevier Interactive Patient Education © 2019 [employer]vier  Inc[employer] ° °

## 2018-05-13 ENCOUNTER — Encounter (HOSPITAL_COMMUNITY): Payer: Self-pay | Admitting: Cardiovascular Disease

## 2018-05-13 MED FILL — Verapamil HCl IV Soln 2.5 MG/ML: INTRAVENOUS | Qty: 2 | Status: AC

## 2018-05-18 ENCOUNTER — Telehealth: Payer: Self-pay | Admitting: Cardiology

## 2018-05-18 NOTE — Telephone Encounter (Signed)
Per cath post op instructions, I told patient she may drive after 24 hrs unless told otherwise. Patient agreed

## 2018-05-18 NOTE — Telephone Encounter (Signed)
Pt had a cath last week, wants to know if she's able to drive.

## 2018-05-19 ENCOUNTER — Other Ambulatory Visit (HOSPITAL_COMMUNITY): Payer: Self-pay | Admitting: Respiratory Therapy

## 2018-05-19 DIAGNOSIS — J441 Chronic obstructive pulmonary disease with (acute) exacerbation: Secondary | ICD-10-CM

## 2018-05-28 ENCOUNTER — Encounter: Payer: Self-pay | Admitting: Student

## 2018-05-28 ENCOUNTER — Ambulatory Visit (INDEPENDENT_AMBULATORY_CARE_PROVIDER_SITE_OTHER): Payer: Managed Care, Other (non HMO) | Admitting: Student

## 2018-05-28 VITALS — BP 100/58 | HR 67 | Ht <= 58 in | Wt 213.0 lb

## 2018-05-28 DIAGNOSIS — R0609 Other forms of dyspnea: Secondary | ICD-10-CM | POA: Diagnosis not present

## 2018-05-28 DIAGNOSIS — E782 Mixed hyperlipidemia: Secondary | ICD-10-CM | POA: Diagnosis not present

## 2018-05-28 DIAGNOSIS — I10 Essential (primary) hypertension: Secondary | ICD-10-CM

## 2018-05-28 DIAGNOSIS — J449 Chronic obstructive pulmonary disease, unspecified: Secondary | ICD-10-CM

## 2018-05-28 DIAGNOSIS — I209 Angina pectoris, unspecified: Secondary | ICD-10-CM

## 2018-05-28 DIAGNOSIS — Z9889 Other specified postprocedural states: Secondary | ICD-10-CM | POA: Diagnosis not present

## 2018-05-28 NOTE — Progress Notes (Signed)
Cardiology Office Note    Date:  05/28/2018   ID:  Lynn Dixon, DOB April 30, 1950, MRN [id number]  PCP:  Lynn Flavin, MD  Cardiologist: Dina Rich, MD    Chief Complaint  Patient presents with  . Follow-up    s/p cardiac catheterization    History of Present Illness:    Lynn Dixon is a 68 y.o. female with past medical history of HTN, HLD, Type 2 DM, COPD, and OSA (on CPAP) who presents to the office today for follow-up from her recent cardiac catheterization.   She was last examined by Ronie Spies, PA-C on 05/07/2018 and reported still having exertional dyspnea and chest discomfort even when doing routine activities such as vacuuming the floors. Given her persistent symptoms and recent NST in 03/2018 showing a fixed anterior/apical defect and mild basal anterior ischemia, a cardiac catheterization had been recommended for definitive evaluation. This was performed by Dr. Tresa Endo on 05/12/2018 and showed normal coronary arteries and normal LV function without focal segmental wall motion abnormalities. Was noted to have mild pulmonary hypertension and it was thought that her underlying COPD was likely contributing to her exertional dyspnea.  In talking with the patient today, she reports still having dyspnea on exertion with routine activities which is being further worked up by her Pulmonologist. She is scheduled for PFT's later this month. Denies any associated chest pain. No recent orthopnea, PND, lower extremity edema, or palpitations.  Reports good compliance with her current medication regimen. Does follow her blood pressure at home and says this has overall been well controlled. BP is soft at 100/58 during today's visit. No associated lightheadedness or dizziness.   No reported complications regarding her recent cardiac catheterization.   Past Medical History:  Diagnosis Date  . Abnormal stress test   . Allergy   . Arthritis    BACK  . Chronic neck pain   . Constipation      takes Sennokot daily  . COPD (chronic obstructive pulmonary disease) (HCC)   . Depression    PTSD-takes Zoloft daily  . Diabetes mellitus without complication (HCC)    diet controled  . GERD (gastroesophageal reflux disease)   . Glaucoma    mild in left eye  . H/O hiatal hernia   . History of blood transfusion   . History of bronchitis   . History of cardiac cath    a. normal cors by cath in 04/2018 following an abnormal NST.  Marland Kitchen History of colon polyps    benign  . Hyperlipidemia   . Hypertension   . Morbid obesity (HCC)   . Sleep apnea    USES CPAP  . Weakness    numbness and tingling. Takes Gabapentin daily    Past Surgical History:  Procedure Laterality Date  . ANTERIOR CERVICAL DECOMP/DISCECTOMY FUSION N/A 07/23/2016   Procedure: CERVICAL THREE-FOUR, CERVICAL FOUR-FIVE, CERVICAL FIVE-SIX ANTERIOR CERVICAL DECOMPRESSION/DISCECTOMY FUSION;  Surgeon: Julio Sicks, MD;  Location: [facility] OR;  Service: Neurosurgery;  Laterality: N/A;  . APPENDECTOMY    . BACK SURGERY     x 4  . BREAST REDUCTION SURGERY    . CARPEL TUNNEL Bilateral   . CATARACT EXTRACTION, BILATERAL  spring 2018   Dr. Darrick Grinder  . CHOLECYSTECTOMY    . COLONOSCOPY    . ESOPHAGOGASTRODUODENOSCOPY    . LUMBAR LAMINECTOMY/DECOMPRESSION MICRODISCECTOMY Left 12/03/2012   Procedure: Left Lumbar Four to Five Diskectomy;  Surgeon: Karn Cassis, MD;  Location: [facility];  Service: Neurosurgery;  Laterality: Left;  LUMBAR LAMINECTOMY/DECOMPRESSION MICRODISCECTOMY 1 LEVEL  . RIGHT/LEFT HEART CATH AND CORONARY ANGIOGRAPHY N/A 05/12/2018   Procedure: RIGHT/LEFT HEART CATH AND CORONARY ANGIOGRAPHY;  Surgeon: Lennette Bihari, MD;  Location: [facility];  Service: Cardiovascular;  Laterality: N/A;  . TOTAL KNEE ARTHROPLASTY Right   . TOTAL KNEE ARTHROPLASTY Left 02/27/2017   Procedure: LEFT TOTAL KNEE ARTHROPLASTY;  Surgeon: Frederico Hamman, MD;  Location: [facility] OR;  Service: Orthopedics;  Laterality: Left;    Current  Medications: Outpatient Medications Prior to Visit  Medication Sig Dispense Refill  . albuterol (PROVENTIL HFA;VENTOLIN HFA) 108 (90 Base) MCG/ACT inhaler Inhale 2 puffs into the lungs every 6 (six) hours as needed for wheezing or shortness of breath. 1 Inhaler 2  . ARIPiprazole (ABILIFY) 2 MG tablet Take 2 mg by mouth daily.    Marland Kitchen aspirin EC 81 MG tablet Take 81 mg by mouth daily.    Marland Kitchen azelastine (ASTELIN) 0.1 % nasal spray Place 1 spray into both nostrils 2 (two) times daily as needed for rhinitis.    Marland Kitchen cetirizine (ZYRTEC) 10 MG tablet Take 10 mg by mouth daily.    . furosemide (LASIX) 40 MG tablet Take 40 mg by mouth daily.    Marland Kitchen gabapentin (NEURONTIN) 300 MG capsule Take 900 mg by mouth 2 (two) times daily.     Marland Kitchen GLUCOSAMINE-CHONDROITIN PO Take 1 tablet by mouth at bedtime.     Marland Kitchen HYDROcodone-acetaminophen (NORCO/VICODIN) 5-325 MG tablet Take 1 tablet by mouth 3 (three) times daily as needed for moderate pain.    Marland Kitchen lisinopril (PRINIVIL,ZESTRIL) 40 MG tablet Take 20 mg by mouth daily.    . miconazole (MICOTIN) 2 % cream Apply 1 application topically 2 (two) times daily as needed (fungus).    . montelukast (SINGULAIR) 10 MG tablet Take 10 mg by mouth daily.     . Multiple Vitamin (MULTIVITAMIN WITH MINERALS) TABS tablet Take 1 tablet by mouth daily.    . naproxen sodium (ALEVE) 220 MG tablet Take 440 mg by mouth daily as needed (pain).    . nitroGLYCERIN (NITROSTAT) 0.4 MG SL tablet Place 1 tablet (0.4 mg total) under the tongue every 5 (five) minutes as needed. 25 tablet 3  . Omega-3 Fatty Acids (FISH OIL PO) Take 1,500 mg by mouth daily.    Marland Kitchen omeprazole (PRILOSEC) 20 MG capsule Take 40 mg by mouth daily before breakfast.     . senna (SENOKOT) 8.6 MG TABS tablet Take 8.6 mg by mouth daily.     . sertraline (ZOLOFT) 100 MG tablet Take 100 mg by mouth 2 (two) times daily.     . simvastatin (ZOCOR) 80 MG tablet Take 40 mg by mouth every evening.    . talc (ZEASORB) powder Apply 1 application  topically as needed (moisture).    . isosorbide mononitrate (IMDUR) 30 MG 24 hr tablet Take 0.5 tablets (15 mg total) by mouth daily. 45 tablet 3  . budesonide-formoterol (SYMBICORT) 160-4.5 MCG/ACT inhaler Inhale 2 puffs into the lungs 2 (two) times daily. 1 Inhaler 12   No facility-administered medications prior to visit.      Allergies:   Coconut flavor; Cephalosporins; Morphine and related; Sulfa antibiotics; Sulfasalazine; Ketorolac; Cefuroxime axetil; and Estrogens   Social History   Socioeconomic History  . Marital status: Married    Spouse name: Not on file  . Number of children: Not on file  . Years of education: Not on file  . Highest education level: Not on file  Occupational History  . Not on file  Social Needs  . Financial resource strain: Not on file  . Food insecurity:    Worry: Not on file    Inability: Not on file  . Transportation needs:    Medical: Not on file    Non-medical: Not on file  Tobacco Use  . Smoking status: Former Games developermoker  . Smokeless tobacco: Never Used  . Tobacco comment: quit smoking 8 yrs ago  Substance and Sexual Activity  . Alcohol use: No  . Drug use: No  . Sexual activity: Not on file  Lifestyle  . Physical activity:    Days per week: Not on file    Minutes per session: Not on file  . Stress: Not on file  Relationships  . Social connections:    Talks on phone: Not on file    Gets together: Not on file    Attends religious service: Not on file    Active member of club or organization: Not on file    Attends meetings of clubs or organizations: Not on file    Relationship status: Not on file  Other Topics Concern  . Not on file  Social History Narrative  . Not on file     Family History:  The patient's family history includes Emphysema in her father; Heart disease in her mother; Pneumonia in her father; Stroke in her maternal grandmother and mother.   Review of Systems:   Please see the history of present illness.      General:  No chills, fever, night sweats or weight changes.  Cardiovascular:  No chest pain, edema, orthopnea, palpitations, paroxysmal nocturnal dyspnea. Positive for dyspnea on exertion.  Dermatological: No rash, lesions/masses Respiratory: No cough, Positive for dyspnea.  Urologic: No hematuria, dysuria Abdominal:   No nausea, vomiting, diarrhea, bright red blood per rectum, melena, or hematemesis Neurologic:  No visual changes, wkns, changes in mental status. All other systems reviewed and are otherwise negative except as noted above.   Physical Exam:    VS:  BP (!) 100/58   Pulse 67   Ht 4' 9.5" (1.461 m)   Wt 213 lb (96.6 kg)   SpO2 92%   BMI 45.29 kg/m    General: Well developed, well nourished Caucasian female appearing in no acute distress. Head: Normocephalic, atraumatic, sclera non-icteric, no xanthomas, nares are without discharge.  Neck: No carotid bruits. JVD not elevated.  Lungs: Respirations regular and unlabored, without wheezes or rales.  Heart: Regular rate and rhythm. No S3 or S4.  No murmur, no rubs, or gallops appreciated. Abdomen: Soft, non-tender, non-distended with normoactive bowel sounds. No hepatomegaly. No rebound/guarding. No obvious abdominal masses. Msk:  Strength and tone appear normal for age. No joint deformities or effusions. Extremities: No clubbing or cyanosis. No lower extremity edema.  Distal pedal pulses are 2+ bilaterally. Cath site stable without ecchymosis or evidence of a hematoma.  Neuro: Alert and oriented X 3. Moves all extremities spontaneously. No focal deficits noted. Psych:  Responds to questions appropriately with a normal affect. Skin: No rashes or lesions noted  Wt Readings from Last 3 Encounters:  05/28/18 213 lb (96.6 kg)  05/12/18 213 lb (96.6 kg)  05/07/18 215 lb (97.5 kg)     Studies/Labs Reviewed:   EKG:  EKG is not ordered today.   Recent Labs: 05/07/2018: ALT 36; BUN 24; Creatinine, Ser 0.85; Platelets  128 05/12/2018: Hemoglobin 9.9; Potassium 4.3; Sodium 142   Lipid Panel No results found  for: CHOL, TRIG, HDL, CHOLHDL, VLDL, LDLCALC, LDLDIRECT  Additional studies/ records that were reviewed today include:   Echocardiogram: 04/05/2018 Study Conclusions  - Left ventricle: The cavity size was normal. Wall thickness was   normal. Systolic function was normal. The estimated ejection   fraction was in the range of 55% to 60%. Wall motion was normal;   there were no regional wall motion abnormalities. Indeterminate   diastolic function. - Aortic valve: Mildly calcified annulus. Trileaflet. - Mitral valve: Mildly calcified annulus. There was trivial   regurgitation. - Right atrium: Central venous pressure (est): 3 mm Hg. - Tricuspid valve: There was trivial regurgitation. - Pulmonary arteries: Systolic pressure could not be accurately   estimated. - Pericardium, extracardiac: A prominent pericardial fat pad was   present.  Cardiac Catheterization: 05/12/2018 Mild right heart pressure elevation with mild pulmonary hypertension.  Normal LV function without focal segmental wall motion abnormalities; LVEDP 21 mmHg  Normal coronary arteries.  RECOMMENDATION: Medical therapy.  Suspect the patient's underlying COPD may be contributing to her exertional dyspnea.  Assessment:    1. Dyspnea on exertion   2. History of cardiac catheterization   3. Mixed hyperlipidemia   4. Essential hypertension   5. Chronic obstructive pulmonary disease, unspecified COPD type (HCC)      Plan:   In order of problems listed above:  1. Dyspnea on Exertion/ Recent Cardiac Catheterization - Recently underwent stress testing which showed evidence of ischemia but was found to have normal coronary arteries by cardiac catheterization. Did have mild pulmonary hypertension and it was thought that her underlying COPD was the main contributing factor to her exertional dyspnea. Catheterization report reviewed  in detail with the patient today. Will continue with risk factor modification including ASA and statin therapy. Given no recurrent chest pain and reassuring cardiac catheterization, will plan to stop Imdur.  2. HLD - Followed by PCP. She remains on Simvastatin 40 mg daily.  3. HTN - Reports BP has overall been well controlled at home but is soft at 100/58 during today's visit. Will plan to stop Imdur as outlined above. Continue Lisinopril 20 mg daily.  4. COPD/ OSA - being followed by Dr. Juanetta Gosling with plans for PFT's later this month. Continued compliance with CPAP at night encouraged.    Medication Adjustments/Labs and Tests Ordered: Current medicines are reviewed at length with the patient today.  Concerns regarding medicines are outlined above.  Medication changes, Labs and Tests ordered today are listed in the Patient Instructions below. Patient Instructions  Medication Instructions:  Stop Imdur  Labwork: None  Testing/Procedures: None  Follow-Up: With Dr. Wyline Mood in 1 year.   Any Other Special Instructions Will Be Listed Below (If Applicable).  If you need a refill on your cardiac medications before your next appointment, please call your pharmacy.    Signed, Ellsworth Lennox, PA-C  05/28/2018 5:00 PM    [facility] [address] [place], [place] [address] Phone: [phone] Fax: [phone]

## 2018-05-28 NOTE — Patient Instructions (Signed)
Medication Instructions:  Stop Imdur  Labwork: None  Testing/Procedures: None  Follow-Up: With Dr. Wyline Mood in 1 year.   Any Other Special Instructions Will Be Listed Below (If Applicable).     If you need a refill on your cardiac medications before your next appointment, please call your pharmacy.

## 2018-06-09 ENCOUNTER — Ambulatory Visit (HOSPITAL_COMMUNITY)
Admission: RE | Admit: 2018-06-09 | Discharge: 2018-06-09 | Disposition: A | Discharge status: Home / Self Care | Payer: Managed Care, Other (non HMO) | Source: Ambulatory Visit | Attending: Pulmonary Disease | Admitting: Pulmonary Disease

## 2018-06-09 DIAGNOSIS — J441 Chronic obstructive pulmonary disease with (acute) exacerbation: Secondary | ICD-10-CM | POA: Diagnosis not present

## 2018-06-09 LAB — PULMONARY FUNCTION TEST
DL/VA % pred: 64 %
DL/VA: 2.8 ml/min/mmHg/L
DLCO UNC % PRED: 63 %
DLCO UNC: 9.78 ml/min/mmHg
FEF 25-75 PRE: 1.48 L/s
FEF 25-75 Post: 1.29 L/sec
FEF2575-%Change-Post: -12 %
FEF2575-%Pred-Post: 81 %
FEF2575-%Pred-Pre: 92 %
FEV1-%Change-Post: -3 %
FEV1-%Pred-Post: 114 %
FEV1-%Pred-Pre: 118 %
FEV1-Post: 1.96 L
FEV1-Pre: 2.03 L
FEV1FVC-%Change-Post: -3 %
FEV1FVC-%Pred-Pre: 97 %
FEV6-%Change-Post: -1 %
FEV6-%PRED-PRE: 126 %
FEV6-%Pred-Post: 124 %
FEV6-Post: 2.7 L
FEV6-Pre: 2.73 L
FEV6FVC-%CHANGE-POST: -1 %
FEV6FVC-%Pred-Post: 103 %
FEV6FVC-%Pred-Pre: 105 %
FVC-%Change-Post: 0 %
FVC-%Pred-Post: 120 %
FVC-%Pred-Pre: 120 %
FVC-Post: 2.74 L
FVC-Pre: 2.73 L
PRE FEV6/FVC RATIO: 100 %
Post FEV1/FVC ratio: 72 %
Post FEV6/FVC ratio: 99 %
Pre FEV1/FVC ratio: 74 %
RV % pred: 119 %
RV: 2.2 L
TLC % pred: 123 %
TLC: 5.03 L

## 2018-06-09 MED ORDER — ALBUTEROL SULFATE (2.5 MG/3ML) 0.083% IN NEBU
2.5000 mg | INHALATION_SOLUTION | Freq: Once | RESPIRATORY_TRACT | Status: AC
  Administered 2018-06-09: 2.5 mg via RESPIRATORY_TRACT

## 2019-05-18 ENCOUNTER — Ambulatory Visit (INDEPENDENT_AMBULATORY_CARE_PROVIDER_SITE_OTHER): Payer: Managed Care, Other (non HMO) | Admitting: Critical Care Medicine

## 2019-05-18 ENCOUNTER — Encounter: Payer: Self-pay | Admitting: Critical Care Medicine

## 2019-05-18 ENCOUNTER — Institutional Professional Consult (permissible substitution): Payer: Managed Care, Other (non HMO) | Admitting: Pulmonary Disease

## 2019-05-18 ENCOUNTER — Other Ambulatory Visit: Payer: Self-pay

## 2019-05-18 VITALS — BP 120/68 | HR 98 | Temp 97.2°F | Ht <= 58 in | Wt 213.2 lb

## 2019-05-18 DIAGNOSIS — E6609 Other obesity due to excess calories: Secondary | ICD-10-CM

## 2019-05-18 DIAGNOSIS — J9611 Chronic respiratory failure with hypoxia: Secondary | ICD-10-CM

## 2019-05-18 DIAGNOSIS — J432 Centrilobular emphysema: Secondary | ICD-10-CM | POA: Diagnosis not present

## 2019-05-18 MED ORDER — ALBUTEROL SULFATE HFA 108 (90 BASE) MCG/ACT IN AERS: 2.0000 | INHALATION_SPRAY | Freq: Four times a day (QID) | RESPIRATORY_TRACT | 11 refills | Status: AC | PRN

## 2019-05-18 MED ORDER — TRELEGY ELLIPTA 100-62.5-25 MCG/INH IN AEPB: 1.0000 | INHALATION_SPRAY | Freq: Every day | RESPIRATORY_TRACT | 11 refills | Status: DC

## 2019-05-18 NOTE — Patient Instructions (Addendum)
Thank you for visiting Dr. Chestine Spore at [facility]. We recommend the following: Orders Placed This Encounter  Procedures  . Pulmonary function test   Orders Placed This Encounter  Procedures  . Pulmonary function test    Standing Status:   Future    Standing Expiration Date:   05/17/2020    Order Specific Question:   Where should this test be performed?    Answer:   [facility]    Order Specific Question:   Full PFT: includes the following: basic spirometry, spirometry pre & post bronchodilator, diffusion capacity (DLCO), lung volumes    Answer:   Full PFT    Meds ordered this encounter  Medications  . albuterol (VENTOLIN HFA) 108 (90 Base) MCG/ACT inhaler    Sig: Inhale 2 puffs into the lungs every 6 (six) hours as needed for wheezing or shortness of breath.    Dispense:  18 g    Refill:  11  . Fluticasone-Umeclidin-Vilant (TRELEGY ELLIPTA) 100-62.5-25 MCG/INH AEPB    Sig: Inhale 1 puff into the lungs daily.    Dispense:  1 each    Refill:  11    Return in about 6 months (around 11/15/2019).    Please do your part to reduce the spread of COVID-19.

## 2019-05-18 NOTE — Progress Notes (Signed)
Synopsis: Referred in February 2021 for COPD by [name], MD.   Subjective:   PATIENT ID: Lynn Dixon: female DOB: 09-06-1950, MRN: [id number]  Chief Complaint  Patient presents with  . Consult    Patient is here for shortness of breath with exertion. Patient wears 3L when she sleeps.     Lynn Dixon is a 69 year old woman who presents to transfer her care from Dr. Juanetta Gosling, who she has seen for COPD for many years.  She has recently been started on Trelegy inhaler, and she was told to stop using her albuterol inhaler.  She continues to use montelukast, Flonase, and Zyrtec for seasonal allergies.  Her chronic COPD symptoms are shortness of breath with exertion, wheezing with activity, occasional coughing and yellow sputum production.  She also sweats with any exertion.  She has to stop frequently when doing housework.  She can walk less than 1 block or less than 1 flight of stairs.  She stops when she begins to feel lightheaded.  She has never required oxygen during the day, but has 3 L supplemental oxygen to use overnight, which she began about 6 months ago.  She feels better for 1 to 2 hours in the morning since starting oxygen, but then begins wheezing again later in the morning.  Most of her COPD exacerbations have been triggered by sinusitis; she has received prednisone 4-5 times in the past year, but no antibiotics.  No ED visits or hospitalizations.  She was hospitalized with pneumococcal pneumonia several years ago, including a 5-day ICU stay.  She quit smoking in 2005 after 40 years x 2 packs/day.  She is up-to-date on seasonal flu and pneumonia vaccines.  She is working on registering for her Covid vaccine.  She has been working on losing weight, but feels that her activity limitations have been a major limitation for her.  In the past she has lost weight using [employer].  She is looking into bariatric surgery.  02/18/2019 PCP note reviewed- Margot Ables.      Past Medical  History:  Diagnosis Date  . Abnormal stress test   . Allergy   . Arthritis    BACK  . Chronic neck pain   . Constipation    takes Sennokot daily  . COPD (chronic obstructive pulmonary disease) (HCC)   . Depression    PTSD-takes Zoloft daily  . Diabetes mellitus without complication (HCC)    diet controled  . GERD (gastroesophageal reflux disease)   . Glaucoma    mild in left eye  . H/O hiatal hernia   . History of blood transfusion   . History of bronchitis   . History of cardiac cath    a. normal cors by cath in 04/2018 following an abnormal NST.  Marland Kitchen History of colon polyps    benign  . Hyperlipidemia   . Hypertension   . Morbid obesity (HCC)   . Sleep apnea    USES CPAP  . Weakness    numbness and tingling. Takes Gabapentin daily     Family History  Problem Relation Age of Onset  . Heart disease Mother   . Stroke Mother   . Emphysema Father   . Pneumonia Father   . Stroke Maternal Grandmother      Past Surgical History:  Procedure Laterality Date  . ANTERIOR CERVICAL DECOMP/DISCECTOMY FUSION N/A 07/23/2016   Procedure: CERVICAL THREE-FOUR, CERVICAL FOUR-FIVE, CERVICAL FIVE-SIX ANTERIOR CERVICAL DECOMPRESSION/DISCECTOMY FUSION;  Surgeon: Julio Sicks, MD;  Location: [facility]  OR;  Service: Neurosurgery;  Laterality: N/A;  . APPENDECTOMY    . BACK SURGERY     x 4  . BREAST REDUCTION SURGERY    . CARPEL TUNNEL Bilateral   . CATARACT EXTRACTION, BILATERAL  spring 2018   Dr. Darrick Grinder  . CHOLECYSTECTOMY    . COLONOSCOPY    . ESOPHAGOGASTRODUODENOSCOPY    . LUMBAR LAMINECTOMY/DECOMPRESSION MICRODISCECTOMY Left 12/03/2012   Procedure: Left Lumbar Four to Five Diskectomy;  Surgeon: Karn Cassis, MD;  Location: [facility]UR[facility] ORS;  Service: Neurosurgery;  Laterality: Left;  LUMBAR LAMINECTOMY/DECOMPRESSION MICRODISCECTOMY 1 LEVEL  . RIGHT/LEFT HEART CATH AND CORONARY ANGIOGRAPHY N/A 05/12/2018   Procedure: RIGHT/LEFT HEART CATH AND CORONARY ANGIOGRAPHY;  Surgeon: Lennette Bihari,  MD;  Location: [facility] INVASIVE CV LAB;  Service: Cardiovascular;  Laterality: N/A;  . TOTAL KNEE ARTHROPLASTY Right   . TOTAL KNEE ARTHROPLASTY Left 02/27/2017   Procedure: LEFT TOTAL KNEE ARTHROPLASTY;  Surgeon: Frederico Hamman, MD;  Location: [facility] OR;  Service: Orthopedics;  Laterality: Left;    Social History   Socioeconomic History  . Marital status: Married    Spouse name: Not on file  . Number of children: Not on file  . Years of education: Not on file  . Highest education level: Not on file  Occupational History  . Not on file  Tobacco Use  . Smoking status: Former Smoker    Quit date: 05/17/2004    Years since quitting: 15.0  . Smokeless tobacco: Never Used  Substance and Sexual Activity  . Alcohol use: No  . Drug use: No  . Sexual activity: Not on file  Other Topics Concern  . Not on file  Social History Narrative  . Not on file   Social Determinants of Health   Financial Resource Strain:   . Difficulty of Paying Living Expenses: Not on file  Food Insecurity:   . Worried About Programme researcher, broadcasting/film/video in the Last Year: Not on file  . Ran Out of Food in the Last Year: Not on file  Transportation Needs:   . Lack of Transportation (Medical): Not on file  . Lack of Transportation (Non-Medical): Not on file  Physical Activity:   . Days of Exercise per Week: Not on file  . Minutes of Exercise per Session: Not on file  Stress:   . Feeling of Stress : Not on file  Social Connections:   . Frequency of Communication with Friends and Family: Not on file  . Frequency of Social Gatherings with Friends and Family: Not on file  . Attends Religious Services: Not on file  . Active Member of Clubs or Organizations: Not on file  . Attends Banker Meetings: Not on file  . Marital Status: Not on file  Intimate Partner Violence:   . Fear of Current or Ex-Partner: Not on file  . Emotionally Abused: Not on file  . Physically Abused: Not on file  . Sexually Abused: Not on file      Allergies  Allergen Reactions  . Coconut Flavor Anaphylaxis    Anything with coconut in it  . Cephalosporins Hives  . Morphine And Related     dizziness  . Sulfa Antibiotics Hives and Rash  . Sulfasalazine Hives and Rash  . Ketorolac     Headaches and dizziness  . Cefuroxime Axetil Itching and Rash  . Estrogens Rash     Immunization History  Administered Date(s) Administered  . Fluad Quad(high Dose 65+) 03/17/2019  . Influenza,inj,Quad  PF,6+ Mos 02/29/2016    Outpatient Medications Prior to Visit  Medication Sig Dispense Refill  . aspirin EC 81 MG tablet Take 81 mg by mouth daily.    . benzonatate (TESSALON) 100 MG capsule Take 100 mg by mouth 3 (three) times daily as needed for cough.    . busPIRone (BUSPAR) 5 MG tablet Take 5 mg by mouth 2 (two) times daily.    . calcium-vitamin D (OSCAL WITH D) 500-200 MG-UNIT tablet Take 1 tablet by mouth daily with breakfast.    . cetirizine (ZYRTEC) 10 MG tablet Take 10 mg by mouth daily.    . eszopiclone (LUNESTA) 2 MG TABS tablet Take 2 mg by mouth at bedtime as needed for sleep. Take immediately before bedtime    . fluticasone (FLONASE) 50 MCG/ACT nasal spray Place 2 sprays into both nostrils daily.    . furosemide (LASIX) 40 MG tablet Take 40 mg by mouth daily.    Marland Kitchen gabapentin (NEURONTIN) 300 MG capsule Take 900 mg by mouth 2 (two) times daily.     Marland Kitchen GLUCOSAMINE-CHONDROITIN PO Take 1 tablet by mouth at bedtime.     Marland Kitchen HYDROcodone-acetaminophen (NORCO/VICODIN) 5-325 MG tablet Take 1 tablet by mouth 3 (three) times daily as needed for moderate pain.    . Ipratropium-Albuterol (COMBIVENT IN) Inhale into the lungs.    Marland Kitchen lisinopril (PRINIVIL,ZESTRIL) 40 MG tablet Take 20 mg by mouth daily.    . Melatonin 3 MG TABS Take 4 tablets by mouth daily with breakfast.    . miconazole (MICOTIN) 2 % cream Apply 1 application topically 2 (two) times daily as needed (fungus).    . montelukast (SINGULAIR) 10 MG tablet Take 10 mg by mouth daily.       . Multiple Vitamin (MULTIVITAMIN WITH MINERALS) TABS tablet Take 1 tablet by mouth daily.    . naproxen sodium (ALEVE) 220 MG tablet Take 440 mg by mouth daily as needed (pain).    . nitroGLYCERIN (NITROSTAT) 0.4 MG SL tablet Place 1 tablet (0.4 mg total) under the tongue every 5 (five) minutes as needed. 25 tablet 3  . Omega-3 Fatty Acids (FISH OIL PO) Take 1,500 mg by mouth daily.    Marland Kitchen omeprazole (PRILOSEC) 20 MG capsule Take 40 mg by mouth daily before breakfast.     . senna (SENOKOT) 8.6 MG TABS tablet Take 8.6 mg by mouth daily.     . sertraline (ZOLOFT) 100 MG tablet Take 100 mg by mouth 2 (two) times daily.     . simvastatin (ZOCOR) 80 MG tablet Take 40 mg by mouth every evening.    . talc (ZEASORB) powder Apply 1 application topically as needed (moisture).    . Vitamin D3 (VITAMIN D) 25 MCG tablet Take 1,000 Units by mouth daily.    Marland Kitchen albuterol (PROVENTIL HFA;VENTOLIN HFA) 108 (90 Base) MCG/ACT inhaler Inhale 2 puffs into the lungs every 6 (six) hours as needed for wheezing or shortness of breath. 1 Inhaler 2  . budesonide-formoterol (SYMBICORT) 160-4.5 MCG/ACT inhaler Inhale 2 puffs into the lungs 2 (two) times daily.    . ARIPiprazole (ABILIFY) 2 MG tablet Take 2 mg by mouth daily.    Marland Kitchen azelastine (ASTELIN) 0.1 % nasal spray Place 1 spray into both nostrils 2 (two) times daily as needed for rhinitis.     No facility-administered medications prior to visit.    Review of Systems  Constitutional: Positive for malaise/fatigue.  Respiratory: Positive for cough, sputum production, shortness of breath and wheezing.  Cardiovascular: Negative for chest pain.     Objective:   Vitals:   05/18/19 1429  BP: 120/68  Pulse: 98  Temp: (!) 97.2 F (36.2 C)  TempSrc: Temporal  SpO2: 93%  Weight: 213 lb 3.2 oz (96.7 kg)  Height: 4' 9.5" (1.461 m)   93% on   RA BMI Readings from Last 3 Encounters:  05/18/19 45.34 kg/m  05/28/18 45.29 kg/m  05/12/18 45.29 kg/m   Wt Readings  from Last 3 Encounters:  05/18/19 213 lb 3.2 oz (96.7 kg)  05/28/18 213 lb (96.6 kg)  05/12/18 213 lb (96.6 kg)    Physical Exam Vitals reviewed.  Constitutional:      Appearance: She is obese.  HENT:     Head: Normocephalic and atraumatic.  Eyes:     General: No scleral icterus. Cardiovascular:     Rate and Rhythm: Normal rate and regular rhythm.     Heart sounds: No murmur.  Pulmonary:     Comments: Breathing comfortably on room air, no conversational dyspnea.  Distant breath sounds, but no wheezing or rhonchi. Musculoskeletal:        General: No deformity.     Cervical back: Neck supple.     Comments: Symmetric bilateral lower extremity edema  Lymphadenopathy:     Cervical: No cervical adenopathy.  Skin:    General: Skin is warm and dry.     Findings: No rash.  Neurological:     General: No focal deficit present.     Mental Status: She is alert.     Motor: No weakness.     Coordination: Coordination normal.  Psychiatric:        Mood and Affect: Mood normal.        Behavior: Behavior normal.      CBC    Component Value Date/Time   WBC 6.6 05/07/2018 1554   RBC 4.19 05/07/2018 1554   HGB 9.9 (L) 05/12/2018 0929   HCT 29.0 (L) 05/12/2018 0929   PLT 128 (L) 05/07/2018 1554   MCV 88.5 05/07/2018 1554   MCH 28.2 05/07/2018 1554   MCHC 31.8 05/07/2018 1554   RDW 13.6 05/07/2018 1554   LYMPHSABS 1.7 05/07/2018 1554   MONOABS 0.5 05/07/2018 1554   EOSABS 0.1 05/07/2018 1554   BASOSABS 0.0 05/07/2018 1554    CHEMISTRY No results for input(s): NA, K, CL, CO2, GLUCOSE, BUN, CREATININE, CALCIUM, MG, PHOS in the last 168 hours. CrCl cannot be calculated (Patient's most recent lab result is older than the maximum 21 days allowed.).   Chest Imaging- films reviewed: CXR 02/16/2017- normal CXR  CT chest 02/18/2018 [facility]emphysema in the upper lobes, no PE  Pulmonary Functions Testing Results: PFT Results Latest Ref Rng & Units  06/09/2018  FVC-Pre L 2.73  FVC-Predicted Pre % 120  FVC-Post L 2.74  FVC-Predicted Post % 120  Pre FEV1/FVC % % 74  Post FEV1/FCV % % 72  FEV1-Pre L 2.03  FEV1-Predicted Pre % 118  FEV1-Post L 1.96  DLCO UNC% % 63  DLCO COR %Predicted % 64  TLC L 5.03  TLC % Predicted % 123  RV % Predicted % 119   No significant obstruction or bronchodilator reversibility. Hyperinflation is present. Mildly reduced diffusion. Flow volume loop suggests obstruction.   Echocardiogram 04/05/2018: LVEF 55 to 60%, no regional wall motion abnormalities. Normal LA, RV, RA. Mild PR, TR, MR.  Heart Catheterization 05/12/2018: RA: a-wave 13, v wave 11, mean 10 RV: 38/11 PA:  38/17; mean 24 PW: A-wave 21, V wave 16; mean 15 Initial Ao: 114/60 LV: 120/21 Pullback: LV: 125/22 Ao: 126/68 Oxygen saturation in the pulmonary artery 65% and in the central aorta 98%. By the Fick method, cardiac output 4.7 L/min and cardiac index 2.5 L/min/m. SVR: 15.1 PVR: 1.9 WU     Assessment & Plan:     ICD-10-CM   1. Centrilobular emphysema (HCC)  J43.2 Pulmonary function test    albuterol (VENTOLIN HFA) 108 (90 Base) MCG/ACT inhaler    Fluticasone-Umeclidin-Vilant (TRELEGY ELLIPTA) 100-62.5-25 MCG/INH AEPB  2. Obesity due to excess calories, unspecified classification, unspecified whether serious comorbidity present  E66.09   3. Chronic respiratory failure with hypoxia (HCC)  J96.11      Chronic hypoxic respiratory failure requiring nighttime supplemental oxygen due to COPD. GOLD group D with frequent mild exacerbations.  Obesity and deconditioning likely contribute to chronic dyspnea with exertion. -Continue triple inhaled therapy-Trelegy once daily.  Rinse her mouth after use -Albuterol as needed -Recommended to continue regular physical activity to increase her exercise tolerance, but continue to stop if she feels faint, lightheaded, or develops chest pain. -Agree that weight loss would be beneficial -Continue  to avoid alcohol -Up-to-date on seasonal flu and pneumonia vaccines.  Agree with plan for Covid vaccination in the future.  Continue Covid precautions-social distancing, mask wearing, handwashing. -Continue supplemental oxygen at night.  Obesity -Discussed the importance of maintaining healthy weight as a long-term goal. -Discussed that she is a high risk candidate for bariatric surgery given her chronic lung disease, but that does not preclude her candidacy.  If she is interested in that she should meet with a surgeon and start working on the requisite weight loss that would be required preoperatively. -Reinforced that dietary modification, especially portion control are the mainstay of weight loss.  RTC in 3 months.    Current Outpatient Medications:  .  aspirin EC 81 MG tablet, Take 81 mg by mouth daily., Disp: , Rfl:  .  benzonatate (TESSALON) 100 MG capsule, Take 100 mg by mouth 3 (three) times daily as needed for cough., Disp: , Rfl:  .  busPIRone (BUSPAR) 5 MG tablet, Take 5 mg by mouth 2 (two) times daily., Disp: , Rfl:  .  calcium-vitamin D (OSCAL WITH D) 500-200 MG-UNIT tablet, Take 1 tablet by mouth daily with breakfast., Disp: , Rfl:  .  cetirizine (ZYRTEC) 10 MG tablet, Take 10 mg by mouth daily., Disp: , Rfl:  .  eszopiclone (LUNESTA) 2 MG TABS tablet, Take 2 mg by mouth at bedtime as needed for sleep. Take immediately before bedtime, Disp: , Rfl:  .  fluticasone (FLONASE) 50 MCG/ACT nasal spray, Place 2 sprays into both nostrils daily., Disp: , Rfl:  .  furosemide (LASIX) 40 MG tablet, Take 40 mg by mouth daily., Disp: , Rfl:  .  gabapentin (NEURONTIN) 300 MG capsule, Take 900 mg by mouth 2 (two) times daily. , Disp: , Rfl:  .  GLUCOSAMINE-CHONDROITIN PO, Take 1 tablet by mouth at bedtime. , Disp: , Rfl:  .  HYDROcodone-acetaminophen (NORCO/VICODIN) 5-325 MG tablet, Take 1 tablet by mouth 3 (three) times daily as needed for moderate pain., Disp: , Rfl:  .   Ipratropium-Albuterol (COMBIVENT IN), Inhale into the lungs., Disp: , Rfl:  .  lisinopril (PRINIVIL,ZESTRIL) 40 MG tablet, Take 20 mg by mouth daily., Disp: , Rfl:  .  Melatonin 3 MG TABS, Take 4 tablets by mouth daily with breakfast., Disp: , Rfl:  .  miconazole (MICOTIN) 2 % cream, Apply 1 application topically 2 (two) times daily as needed (fungus)., Disp: , Rfl:  .  montelukast (SINGULAIR) 10 MG tablet, Take 10 mg by mouth daily. , Disp: , Rfl:  .  Multiple Vitamin (MULTIVITAMIN WITH MINERALS) TABS tablet, Take 1 tablet by mouth daily., Disp: , Rfl:  .  naproxen sodium (ALEVE) 220 MG tablet, Take 440 mg by mouth daily as needed (pain)., Disp: , Rfl:  .  nitroGLYCERIN (NITROSTAT) 0.4 MG SL tablet, Place 1 tablet (0.4 mg total) under the tongue every 5 (five) minutes as needed., Disp: 25 tablet, Rfl: 3 .  Omega-3 Fatty Acids (FISH OIL PO), Take 1,500 mg by mouth daily., Disp: , Rfl:  .  omeprazole (PRILOSEC) 20 MG capsule, Take 40 mg by mouth daily before breakfast. , Disp: , Rfl:  .  senna (SENOKOT) 8.6 MG TABS tablet, Take 8.6 mg by mouth daily. , Disp: , Rfl:  .  sertraline (ZOLOFT) 100 MG tablet, Take 100 mg by mouth 2 (two) times daily. , Disp: , Rfl:  .  simvastatin (ZOCOR) 80 MG tablet, Take 40 mg by mouth every evening., Disp: , Rfl:  .  talc (ZEASORB) powder, Apply 1 application topically as needed (moisture)., Disp: , Rfl:  .  Vitamin D3 (VITAMIN D) 25 MCG tablet, Take 1,000 Units by mouth daily., Disp: , Rfl:  .  albuterol (VENTOLIN HFA) 108 (90 Base) MCG/ACT inhaler, Inhale 2 puffs into the lungs every 6 (six) hours as needed for wheezing or shortness of breath., Disp: 18 g, Rfl: 11 .  Fluticasone-Umeclidin-Vilant (TRELEGY ELLIPTA) 100-62.5-25 MCG/INH AEPB, Inhale 1 puff into the lungs daily., Disp: 1 each, Rfl: 11    Steffanie Dunn, DO [facility] 05/18/2019 3:26 PM

## 2019-05-19 ENCOUNTER — Institutional Professional Consult (permissible substitution): Payer: Managed Care, Other (non HMO) | Admitting: Internal Medicine

## 2019-05-29 ENCOUNTER — Ambulatory Visit: Payer: Managed Care, Other (non HMO) | Attending: Internal Medicine

## 2019-05-29 ENCOUNTER — Other Ambulatory Visit: Payer: Self-pay

## 2019-05-29 DIAGNOSIS — Z23 Encounter for immunization: Secondary | ICD-10-CM | POA: Insufficient documentation

## 2019-05-29 NOTE — Progress Notes (Signed)
   Covid-19 Vaccination Clinic  Name:  Lynn Dixon    MRN: [id number] DOB: 1950-05-10  05/29/2019  Lynn Dixon was observed post Covid-19 immunization for 15 minutes without incidence. She was provided with Vaccine Information Sheet and instruction to access the V-Safe system.   Lynn Dixon was instructed to call 911 with any severe reactions post vaccine: Marland Kitchen Difficulty breathing  . Swelling of your face and throat  . A fast heartbeat  . A bad rash all over your body  . Dizziness and weakness    Immunizations Administered    Name Date Dose VIS Date Route   Moderna COVID-19 Vaccine 05/29/2019  2:50 PM 0.5 mL 03/15/2019 Intramuscular   Manufacturer: Moderna   Lot: [id number]   NDC: 75732-256-72

## 2019-06-26 ENCOUNTER — Ambulatory Visit: Payer: Managed Care, Other (non HMO) | Attending: Internal Medicine

## 2019-06-26 DIAGNOSIS — Z23 Encounter for immunization: Secondary | ICD-10-CM

## 2019-06-26 NOTE — Progress Notes (Signed)
   Covid-19 Vaccination Clinic  Name:  Lynn Dixon    MRN: [id number] DOB: October 24, 1950  06/26/2019  Ms. [name] was observed post Covid-19 immunization for 15 minutes without incident. She was provided with Vaccine Information Sheet and instruction to access the V-Safe system.   Ms. [name] was instructed to call 911 with any severe reactions post vaccine: Marland Kitchen Difficulty breathing  . Swelling of face and throat  . A fast heartbeat  . A bad rash all over body  . Dizziness and weakness   Immunizations Administered    Name Date Dose VIS Date Route   Moderna COVID-19 Vaccine 06/26/2019  1:37 PM 0.5 mL 03/15/2019 Intramuscular   Manufacturer: Moderna   Lot: [id number]   NDC: 95396-728-97

## 2019-07-18 ENCOUNTER — Other Ambulatory Visit: Payer: Self-pay

## 2019-07-18 ENCOUNTER — Other Ambulatory Visit (HOSPITAL_COMMUNITY)
Admission: RE | Admit: 2019-07-18 | Discharge: 2019-07-18 | Disposition: A | Discharge status: Home / Self Care | Payer: Managed Care, Other (non HMO) | Source: Ambulatory Visit | Attending: Critical Care Medicine | Admitting: Critical Care Medicine

## 2019-07-18 DIAGNOSIS — Z20822 Contact with and (suspected) exposure to covid-19: Secondary | ICD-10-CM | POA: Diagnosis not present

## 2019-07-18 DIAGNOSIS — Z01812 Encounter for preprocedural laboratory examination: Secondary | ICD-10-CM | POA: Insufficient documentation

## 2019-07-18 LAB — SARS CORONAVIRUS 2 (TAT 6-24 HRS): SARS Coronavirus 2: NEGATIVE

## 2019-07-20 ENCOUNTER — Other Ambulatory Visit: Payer: Self-pay

## 2019-07-20 ENCOUNTER — Ambulatory Visit (INDEPENDENT_AMBULATORY_CARE_PROVIDER_SITE_OTHER): Payer: Managed Care, Other (non HMO) | Admitting: Critical Care Medicine

## 2019-07-20 DIAGNOSIS — J432 Centrilobular emphysema: Secondary | ICD-10-CM

## 2019-07-20 LAB — PULMONARY FUNCTION TEST
DL/VA % pred: 76 %
DL/VA: 3.34 ml/min/mmHg/L
DLCO cor % pred: 84 %
DLCO cor: 13.03 ml/min/mmHg
DLCO unc % pred: 84 %
DLCO unc: 13.03 ml/min/mmHg
FEF 25-75 Post: 1.48 L/sec
FEF 25-75 Pre: 1.16 L/sec
FEF2575-%Change-Post: 27 %
FEF2575-%Pred-Post: 95 %
FEF2575-%Pred-Pre: 75 %
FEV1-%Change-Post: 3 %
FEV1-%Pred-Post: 114 %
FEV1-%Pred-Pre: 111 %
FEV1-Post: 1.93 L
FEV1-Pre: 1.87 L
FEV1FVC-%Change-Post: 7 %
FEV1FVC-%Pred-Pre: 95 %
FEV6-%Change-Post: -2 %
FEV6-%Pred-Post: 116 %
FEV6-%Pred-Pre: 120 %
FEV6-Post: 2.49 L
FEV6-Pre: 2.56 L
FEV6FVC-%Change-Post: 1 %
FEV6FVC-%Pred-Post: 105 %
FEV6FVC-%Pred-Pre: 104 %
FVC-%Change-Post: -3 %
FVC-%Pred-Post: 111 %
FVC-%Pred-Pre: 115 %
FVC-Post: 2.49 L
FVC-Pre: 2.59 L
Post FEV1/FVC ratio: 78 %
Post FEV6/FVC ratio: 100 %
Pre FEV1/FVC ratio: 72 %
Pre FEV6/FVC Ratio: 99 %
RV % pred: 81 %
RV: 1.52 L
TLC % pred: 106 %
TLC: 4.35 L

## 2019-07-20 NOTE — Progress Notes (Signed)
Full PFT performed today. °

## 2019-07-25 ENCOUNTER — Other Ambulatory Visit: Payer: Self-pay | Admitting: Adult Health

## 2019-07-25 ENCOUNTER — Ambulatory Visit (INDEPENDENT_AMBULATORY_CARE_PROVIDER_SITE_OTHER): Payer: Managed Care, Other (non HMO) | Admitting: Adult Health

## 2019-07-25 ENCOUNTER — Other Ambulatory Visit: Payer: Self-pay

## 2019-07-25 ENCOUNTER — Encounter: Payer: Self-pay | Admitting: Adult Health

## 2019-07-25 DIAGNOSIS — G4733 Obstructive sleep apnea (adult) (pediatric): Secondary | ICD-10-CM | POA: Diagnosis not present

## 2019-07-25 DIAGNOSIS — J449 Chronic obstructive pulmonary disease, unspecified: Secondary | ICD-10-CM

## 2019-07-25 DIAGNOSIS — R0609 Other forms of dyspnea: Secondary | ICD-10-CM

## 2019-07-25 DIAGNOSIS — E6609 Other obesity due to excess calories: Secondary | ICD-10-CM | POA: Diagnosis not present

## 2019-07-25 DIAGNOSIS — J9611 Chronic respiratory failure with hypoxia: Secondary | ICD-10-CM | POA: Diagnosis not present

## 2019-07-25 DIAGNOSIS — J432 Centrilobular emphysema: Secondary | ICD-10-CM

## 2019-07-25 DIAGNOSIS — R0600 Dyspnea, unspecified: Secondary | ICD-10-CM

## 2019-07-25 NOTE — Progress Notes (Signed)
Virtual Visit via Telephone Note  I connected with Lynn Dixon on 07/25/19 at  9:00 AM EDT by telephone and verified that I am speaking with the correct person using two identifiers.  Location: Patient: Home   Provider: Home    I discussed the limitations, risks, security and privacy concerns of performing an evaluation and management service by telephone and the availability of in person appointments. I also discussed with the patient that there may be a patient responsible charge related to this service. The patient expressed understanding and agreed to proceed.   History of Present Illness: 69 year old female former smoker seen for pulmonary consult May 18, 2019 to establish for COPD.  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  She quit smoking in 2006 from a heavy smoking history with 80-pack-year.   Today's televisit is a 2-month follow-up.  Patient remains on Trelegy daily.  She says this does help with her breathing.  She had pulmonary function testing last week that showed normal lung function with no airflow obstruction or restriction.  DLCO was improved from 2020.  FEV1 114%, ratio 78, FVC 111%, no significant bronchodilator response, DLCO 84%.  (2020 DLCO was 63%).  She says overall her breathing is doing the same.  She has no significant cough or wheezing.  Her main complaint is that she gets short of breath with activity.  Patient has multiple comorbidities including morbid obesity BMI.  She had a right and left heart cath last year 2020 that showed mild pulmonary hypertension, preserved EF.  And normal coronary arteries.  Last November 2019 showed no PE, + emphysema. Gets winded with minimal active . Is working on weight loss.  Patient is on ACE inhibitor.  But denies chronic cough.   Patient has chronic allergic rhinitis is on Singulair, Flonase and Zyrtec daily.  She says overall allergies are doing well despite the high pollen counts outside  Patient has  underlying sleep apnea is on nocturnal CPAP with oxygen at 3 L.  Says she wears it every single night gets in about 6 to 8 hours each night.  Says that she feels rested with no significant daytime sleepiness.  She gets her CPAP and supplies through [facility].   Patient Active Problem List   Diagnosis Date Noted  . Angina pectoris (HCC)   . Exertional dyspnea   . Primary localized osteoarthritis of left knee 02/27/2017  . Cervical stenosis of spinal canal 07/23/2016  . COPD exacerbation (HCC)   . Centrilobular emphysema (HCC)   . OSA (obstructive sleep apnea)   . Other hyperlipidemia   . Endotracheally intubated   . Encounter for orogastric (OG) tube placement   . Community acquired pneumonia of left lower lobe of lung 02/24/2016  . 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 UTI (urinary tract infection) 11/30/2012  . Acute lumbar back pain 11/28/2012  . Left leg weakness 11/28/2012  . HTN (hypertension), benign 11/28/2012  . Other and unspecified hyperlipidemia 11/28/2012  . COPD (chronic obstructive pulmonary disease) (HCC) 11/28/2012  . Dehydration 11/28/2012   Current Outpatient Medications on File Prior to Visit  Medication Sig Dispense Refill  . albuterol (VENTOLIN HFA) 108 (90 Base) MCG/ACT inhaler Inhale 2 puffs into the lungs every 6 (six) hours as needed for wheezing or shortness of breath. 18 g 11  . aspirin EC 81 MG tablet Take 81 mg by mouth daily.    . benzonatate (TESSALON) 100 MG capsule Take 100 mg by mouth  3 (three) times daily as needed for cough.    . busPIRone (BUSPAR) 5 MG tablet Take 5 mg by mouth 2 (two) times daily.    . calcium-vitamin D (OSCAL WITH D) 500-200 MG-UNIT tablet Take 1 tablet by mouth daily with breakfast.    . cetirizine (ZYRTEC) 10 MG tablet Take 10 mg by mouth daily.    . eszopiclone (LUNESTA) 2 MG TABS tablet Take 2 mg by mouth at bedtime as needed for sleep. Take immediately before bedtime    . fluticasone (FLONASE) 50 MCG/ACT nasal  spray Place 2 sprays into both nostrils daily.    . Fluticasone-Umeclidin-Vilant (TRELEGY ELLIPTA) 100-62.5-25 MCG/INH AEPB Inhale 1 puff into the lungs daily. 1 each 11  . furosemide (LASIX) 40 MG tablet Take 40 mg by mouth daily.    Marland Kitchen gabapentin (NEURONTIN) 300 MG capsule Take 900 mg by mouth 2 (two) times daily.     Marland Kitchen GLUCOSAMINE-CHONDROITIN PO Take 1 tablet by mouth at bedtime.     Marland Kitchen HYDROcodone-acetaminophen (NORCO/VICODIN) 5-325 MG tablet Take 1 tablet by mouth 3 (three) times daily as needed for moderate pain.    . Ipratropium-Albuterol (COMBIVENT IN) Inhale into the lungs.    Marland Kitchen lisinopril (PRINIVIL,ZESTRIL) 40 MG tablet Take 20 mg by mouth daily.    . Melatonin 3 MG TABS Take 4 tablets by mouth daily with breakfast.    . miconazole (MICOTIN) 2 % cream Apply 1 application topically 2 (two) times daily as needed (fungus).    . montelukast (SINGULAIR) 10 MG tablet Take 10 mg by mouth daily.     . Multiple Vitamin (MULTIVITAMIN WITH MINERALS) TABS tablet Take 1 tablet by mouth daily.    . naproxen sodium (ALEVE) 220 MG tablet Take 440 mg by mouth daily as needed (pain).    . nitroGLYCERIN (NITROSTAT) 0.4 MG SL tablet Place 1 tablet (0.4 mg total) under the tongue every 5 (five) minutes as needed. 25 tablet 3  . Omega-3 Fatty Acids (FISH OIL PO) Take 1,500 mg by mouth daily.    Marland Kitchen omeprazole (PRILOSEC) 20 MG capsule Take 40 mg by mouth daily before breakfast.     . senna (SENOKOT) 8.6 MG TABS tablet Take 8.6 mg by mouth daily.     . sertraline (ZOLOFT) 100 MG tablet Take 100 mg by mouth 2 (two) times daily.     . simvastatin (ZOCOR) 80 MG tablet Take 40 mg by mouth every evening.    . talc (ZEASORB) powder Apply 1 application topically as needed (moisture).    . Vitamin D3 (VITAMIN D) 25 MCG tablet Take 1,000 Units by mouth daily.     No current facility-administered medications on file prior to visit.      Observations/Objective: Speaks in full sentences with no audible  wheezing  Assessment and Plan: Emphysema-no significant airflow obstruction or restriction on PFTs.  In fact DLCO is improved on today's PFTs.  To note patient did take Trelegy prior to PFTs.  May also have a component of reactive airways/chronic bronchitis as she is prone to exacerbations.  She has perceived clinical benefit with Trelegy will continue for now.  Could consider changing to a LABA LAMA on return. Check chest x-ray on return  OSA -continue on CPAP with oxygen at bedtime  Chronic hypoxic respiratory failure continue on oxygen with CPAP at bedtime  Obesity-work on healthy weight loss  Dyspnea with exertion-suspect is multifactorial as patient has underlying emphysema, morbid obesity with suspected deconditioning, multiple comorbidities with polypharmacy PFTs  appear to be normal with no airflow obstruction or restriction.  Her dyspnea seems disproportionate to her lung function.  Right and left heart cath showed normal coronary arteries.  Some mild pulmonary hypertension which could be contributing to shortness of breath.  For now we will continue on healthy weight loss activity as tolerated to improve her exercise capacity/activity tolerance    Plan  Patient Instructions  Continue on Trelegy 1 puff daily Albuterol 1 to 2 puffs every 6 hours as needed for wheezing shortness of breath Activity as tolerated Work on healthy weight Continue on CPAP with oxygen at bedtime CPAP download requested from [employer] Do not drive if sleepy Follow-up in 4 months with Dr. Chestine Spore with chest x-ray Please contact office for sooner follow up if symptoms do not improve or worsen or seek emergency care       Follow Up Instructions: Follow-up in 4 months and as needed   I discussed the assessment and treatment plan with the patient. The patient was provided an opportunity to ask questions and all were answered. The patient agreed with the plan and demonstrated an understanding of the  instructions.   The patient was advised to call back or seek an in-person evaluation if the symptoms worsen or if the condition fails to improve as anticipated.  I provided 25  minutes of non-face-to-face time during this encounter.   Rubye Oaks, NP

## 2019-07-25 NOTE — Patient Instructions (Signed)
Continue on Trelegy 1 puff daily Albuterol 1 to 2 puffs every 6 hours as needed for wheezing shortness of breath Activity as tolerated Work on healthy weight Continue on CPAP with oxygen at bedtime CPAP download requested from [employer] Do not drive if sleepy Follow-up in 4 months with Dr. Chestine Spore with chest x-ray Please contact office for sooner follow up if symptoms do not improve or worsen or seek emergency care

## 2019-08-03 ENCOUNTER — Telehealth: Payer: Self-pay

## 2019-08-03 NOTE — Telephone Encounter (Signed)
  Patient Consent for Virtual Visit         Lynn Dixon has provided verbal consent on 08/03/2019 for a virtual visit (video or telephone).   CONSENT FOR VIRTUAL VISIT FOR:  Lynn Dixon  By participating in this virtual visit I agree to the following:  I hereby voluntarily request, consent and authorize [facility] and its employed or contracted physicians, physician assistants, nurse practitioners or other licensed health care professionals (the Practitioner), to provide me with telemedicine health care services (the "Services") as deemed necessary by the treating Practitioner. I acknowledge and consent to receive the Services by the Practitioner via telemedicine. I understand that the telemedicine visit will involve communicating with the Practitioner through live audiovisual communication technology and the disclosure of certain medical information by electronic transmission. I acknowledge that I have been given the opportunity to request an in-person assessment or other available alternative prior to the telemedicine visit and am voluntarily participating in the telemedicine visit.  I understand that I have the right to withhold or withdraw my consent to the use of telemedicine in the course of my care at any time, without affecting my right to future care or treatment, and that the Practitioner or I may terminate the telemedicine visit at any time. I understand that I have the right to inspect all information obtained and/or recorded in the course of the telemedicine visit and may receive copies of available information for a reasonable fee.  I understand that some of the potential risks of receiving the Services via telemedicine include:  Marland Kitchen Delay or interruption in medical evaluation due to technological equipment failure or disruption; . Information transmitted may not be sufficient (e.g. poor resolution of images) to allow for appropriate medical decision making by the Practitioner;  and/or  . In rare instances, security protocols could fail, causing a breach of personal health information.  Furthermore, I acknowledge that it is my responsibility to provide information about my medical history, conditions and care that is complete and accurate to the best of my ability. I acknowledge that Practitioner's advice, recommendations, and/or decision may be based on factors not within their control, such as incomplete or inaccurate data provided by me or distortions of diagnostic images or specimens that may result from electronic transmissions. I understand that the practice of medicine is not an exact science and that Practitioner makes no warranties or guarantees regarding treatment outcomes. I acknowledge that a copy of this consent can be made available to me via my patient portal [facility] MyChart), or I can request a printed copy by calling the office of [facility].    I understand that my insurance will be billed for this visit.   I have read or had this consent read to me. . I understand the contents of this consent, which adequately explains the benefits and risks of the Services being provided via telemedicine.  . I have been provided ample opportunity to ask questions regarding this consent and the Services and have had my questions answered to my satisfaction. . I give my informed consent for the services to be provided through the use of telemedicine in my medical care

## 2019-08-12 ENCOUNTER — Encounter: Payer: Self-pay | Admitting: Cardiology

## 2019-08-12 ENCOUNTER — Telehealth (INDEPENDENT_AMBULATORY_CARE_PROVIDER_SITE_OTHER): Payer: Managed Care, Other (non HMO) | Admitting: Cardiology

## 2019-08-12 ENCOUNTER — Encounter: Payer: Self-pay | Admitting: *Deleted

## 2019-08-12 VITALS — BP 138/70 | Ht <= 58 in | Wt 203.0 lb

## 2019-08-12 DIAGNOSIS — I10 Essential (primary) hypertension: Secondary | ICD-10-CM | POA: Diagnosis not present

## 2019-08-12 DIAGNOSIS — E782 Mixed hyperlipidemia: Secondary | ICD-10-CM | POA: Diagnosis not present

## 2019-08-12 DIAGNOSIS — R0609 Other forms of dyspnea: Secondary | ICD-10-CM

## 2019-08-12 DIAGNOSIS — R0600 Dyspnea, unspecified: Secondary | ICD-10-CM

## 2019-08-12 NOTE — Progress Notes (Signed)
Virtual Visit via Telephone Note   This visit type was conducted due to national recommendations for restrictions regarding the COVID-19 Pandemic (e.g. social distancing) in an effort to limit this patient's exposure and mitigate transmission in our community.  Due to her co-morbid illnesses, this patient is at least at moderate risk for complications without adequate follow up.  This format is felt to be most appropriate for this patient at this time.  The patient did not have access to video technology/had technical difficulties with video requiring transitioning to audio format only (telephone).  All issues noted in this document were discussed and addressed.  No physical exam could be performed with this format.  Please refer to the patient's chart for her  consent to telehealth for [facility].   The patient was identified using 2 identifiers.  Date:  08/12/2019   ID:  Lynn Dixon, DOB 1951-03-06, MRN [id number]  Patient Location: Home Provider Location: Office  PCP:  Curlene Labrum, MD  Cardiologist:  Carlyle Dolly, MD  Electrophysiologist:  None   Evaluation Performed:  Follow-Up Visit  Chief Complaint:  Follow up  History of Present Illness:    Lynn Dixon is a 69 y.o. female seen today for follow up of the following medical problems.   1. DOE/Chest pain - symptoms started with diaphoretic episodes with activity in the summer. Some tightness in chest, left sided. +SOB. 4/10 in severity. Would stop and rest with some improvement. Progressed over the last few months - finally discussed with pcp a few weeks ago.  - 02/2018 CT PE negaitve per pcp notes - 03/29/2018 [place]  nuclear stress test ordered by pcp: fixed distal anterior/apical defect. Mild basal anterior ischemia. LVEF 48% per nuclear   - worst over the last few weeks. With activity, example walking at grocery store holding onto buggy due to SOB, or vacuuming. Still with chest tightness, can be up  6-7/10 in severity. Can occur 2-3 times per days   - remote cath told small blockage nearly 10 years ago.   CAD risk factors: HTN, HL, former smoker x 40 years, mother with MI late 43s.    03/2018 echo LVEF 55-60%, no WMAs - Jan 2020 cath normal coronaries. Mean PA 24, PCWP 15, LVEDP 22  - some increased SOB. Occasional LE edema. Takes lasix prn to control her swelling.  - sedentary lifestyle due to chronic knee pains.   2. COPD - followed by pcp  3. HTN - compliant with meds - at goal by 05/2019 clinic visit   4. HL - compliant with meds.  - labs followed by pcp   Complete covid vaccine x 2.   The patient does not have symptoms concerning for COVID-19 infection (fever, chills, cough, or new shortness of breath).    Past Medical History:  Diagnosis Date  . Abnormal stress test   . Allergy   . Arthritis    BACK  . Chronic neck pain   . Constipation    takes Sennokot daily  . COPD (chronic obstructive pulmonary disease) ([place])   . Depression    PTSD-takes Zoloft daily  . Diabetes mellitus without complication ([place])    diet controled  . GERD (gastroesophageal reflux disease)   . Glaucoma    mild in left eye  . H/O hiatal hernia   . History of blood transfusion   . History of bronchitis   . History of cardiac cath    a. normal cors by cath  in 04/2018 following an abnormal NST.  Marland Kitchen History of colon polyps    benign  . Hyperlipidemia   . Hypertension   . Morbid obesity (HCC)   . Sleep apnea    USES CPAP  . Weakness    numbness and tingling. Takes Gabapentin daily   Past Surgical History:  Procedure Laterality Date  . ANTERIOR CERVICAL DECOMP/DISCECTOMY FUSION N/A 07/23/2016   Procedure: CERVICAL THREE-FOUR, CERVICAL FOUR-FIVE, CERVICAL FIVE-SIX ANTERIOR CERVICAL DECOMPRESSION/DISCECTOMY FUSION;  Surgeon: Julio Sicks, MD;  Location: [facility] OR;  Service: Neurosurgery;  Laterality: N/A;  . APPENDECTOMY    . BACK SURGERY     x 4  . BREAST REDUCTION SURGERY      . CARPEL TUNNEL Bilateral   . CATARACT EXTRACTION, BILATERAL  spring 2018   Dr. Darrick Grinder  . CHOLECYSTECTOMY    . COLONOSCOPY    . ESOPHAGOGASTRODUODENOSCOPY    . LUMBAR LAMINECTOMY/DECOMPRESSION MICRODISCECTOMY Left 12/03/2012   Procedure: Left Lumbar Four to Five Diskectomy;  Surgeon: Karn Cassis, MD;  Location: [facility] NEURO ORS;  Service: Neurosurgery;  Laterality: Left;  LUMBAR LAMINECTOMY/DECOMPRESSION MICRODISCECTOMY 1 LEVEL  . RIGHT/LEFT HEART CATH AND CORONARY ANGIOGRAPHY N/A 05/12/2018   Procedure: RIGHT/LEFT HEART CATH AND CORONARY ANGIOGRAPHY;  Surgeon: Lennette Bihari, MD;  Location: [facility] INVASIVE CV LAB;  Service: Cardiovascular;  Laterality: N/A;  . TOTAL KNEE ARTHROPLASTY Right   . TOTAL KNEE ARTHROPLASTY Left 02/27/2017   Procedure: LEFT TOTAL KNEE ARTHROPLASTY;  Surgeon: Frederico Hamman, MD;  Location: [facility] OR;  Service: Orthopedics;  Laterality: Left;     Current Meds  Medication Sig  . albuterol (VENTOLIN HFA) 108 (90 Base) MCG/ACT inhaler Inhale 2 puffs into the lungs every 6 (six) hours as needed for wheezing or shortness of breath.  Marland Kitchen aspirin EC 81 MG tablet Take 81 mg by mouth daily.  . benzonatate (TESSALON) 100 MG capsule Take 100 mg by mouth 3 (three) times daily as needed for cough.  . busPIRone (BUSPAR) 5 MG tablet Take 5 mg by mouth 2 (two) times daily.  . calcium-vitamin D (OSCAL WITH D) 500-200 MG-UNIT tablet Take 1 tablet by mouth daily with breakfast.  . cetirizine (ZYRTEC) 10 MG tablet Take 10 mg by mouth daily.  . eszopiclone (LUNESTA) 2 MG TABS tablet Take 2 mg by mouth at bedtime as needed for sleep. Take immediately before bedtime  . fluticasone (FLONASE) 50 MCG/ACT nasal spray Place 2 sprays into both nostrils daily.  . Fluticasone-Umeclidin-Vilant (TRELEGY ELLIPTA) 100-62.5-25 MCG/INH AEPB Inhale 1 puff into the lungs daily.  . furosemide (LASIX) 40 MG tablet Take 40 mg by mouth daily.  Marland Kitchen gabapentin (NEURONTIN) 300 MG capsule Take 900 mg by mouth 2 (two)  times daily.   Marland Kitchen GLUCOSAMINE-CHONDROITIN PO Take 1 tablet by mouth at bedtime.   Marland Kitchen HYDROcodone-acetaminophen (NORCO/VICODIN) 5-325 MG tablet Take 1 tablet by mouth 3 (three) times daily as needed for moderate pain.  . Ipratropium-Albuterol (COMBIVENT IN) Inhale into the lungs.  Marland Kitchen lisinopril (PRINIVIL,ZESTRIL) 40 MG tablet Take 20 mg by mouth daily.  . Melatonin 3 MG TABS Take 4 tablets by mouth daily with breakfast.  . miconazole (MICOTIN) 2 % cream Apply 1 application topically 2 (two) times daily as needed (fungus).  . montelukast (SINGULAIR) 10 MG tablet Take 10 mg by mouth daily.   . Multiple Vitamin (MULTIVITAMIN WITH MINERALS) TABS tablet Take 1 tablet by mouth daily.  . naproxen sodium (ALEVE) 220 MG tablet Take 440 mg by mouth daily as needed (pain).  Marland Kitchen  nitroGLYCERIN (NITROSTAT) 0.4 MG SL tablet Place 1 tablet (0.4 mg total) under the tongue every 5 (five) minutes as needed.  . Omega-3 Fatty Acids (FISH OIL PO) Take 1,500 mg by mouth daily.  Marland Kitchen omeprazole (PRILOSEC) 20 MG capsule Take 40 mg by mouth daily before breakfast.   . senna (SENOKOT) 8.6 MG TABS tablet Take 8.6 mg by mouth daily.   . sertraline (ZOLOFT) 100 MG tablet Take 100 mg by mouth 2 (two) times daily.   . simvastatin (ZOCOR) 80 MG tablet Take 40 mg by mouth every evening.  . talc (ZEASORB) powder Apply 1 application topically as needed (moisture).  . Vitamin D3 (VITAMIN D) 25 MCG tablet Take 1,000 Units by mouth daily.     Allergies:   Coconut flavor, Cephalosporins, Morphine and related, Sulfa antibiotics, Sulfasalazine, Ketorolac, Cefuroxime axetil, and Estrogens   Social History   Tobacco Use  . Smoking status: Former Smoker    Packs/day: 2.00    Years: 40.00    Pack years: 80.00    Types: Cigarettes    Quit date: 05/17/2004    Years since quitting: 15.2  . Smokeless tobacco: Never Used  Substance Use Topics  . Alcohol use: No  . Drug use: No     Family Hx: The patient's family history includes Emphysema  in her father; Heart disease in her mother; Pneumonia in her father; Stroke in her maternal grandmother and mother.  ROS:   Please see the history of present illness.    All other systems reviewed and are negative.   Prior CV studies:   The following studies were reviewed today:  02/2016 echo LVEF 55-60%, no WMAS, grade I diastolic dysfunction.   03/2018 echo Study Conclusions   - Left ventricle: The cavity size was normal. Wall thickness was  normal. Systolic function was normal. The estimated ejection  fraction was in the range of 55% to 60%. Wall motion was normal;  there were no regional wall motion abnormalities. Indeterminate  diastolic function.  - Aortic valve: Mildly calcified annulus. Trileaflet.  - Mitral valve: Mildly calcified annulus. There was trivial  regurgitation.  - Right atrium: Central venous pressure (est): 3 mm Hg.  - Tricuspid valve: There was trivial regurgitation.  - Pulmonary arteries: Systolic pressure could not be accurately  estimated.  - Pericardium, extracardiac: A prominent pericardial fat pad was  present.   Jan 2020 RHC/LHC Mild right heart pressure elevation with mild pulmonary hypertension.  Normal LV function without focal segmental wall motion abnormalities; LVEDP 21 mmHg  Normal coronary arteries.   Labs/Other Tests and Data Reviewed:    EKG:  No ECG reviewed.  Recent Labs: No results found for requested labs within last 8760 hours.   Recent Lipid Panel No results found for: CHOL, TRIG, HDL, CHOLHDL, LDLCALC, LDLDIRECT  Wt Readings from Last 3 Encounters:  08/12/19 203 lb (92.1 kg)  05/18/19 213 lb 3.2 oz (96.7 kg)  05/28/18 213 lb (96.6 kg)     Objective:    Vital Signs:  BP 138/70   Ht 4' 9.5" (1.461 m)   Wt 203 lb (92.1 kg)   BMI 43.17 kg/m    Normal affect. Normal speech pattern and tone. Comfortable, no apparent distress. No audible signs of sob or wheezing.   ASSESSMENT & PLAN:    1.  DOE/Chest pain - extensive cardiac workup as reproted above, overall benign findings other than mildly elevaetd filllig pressures, she is on lasix - no further cardiac workup at this  time,   2. HTN - bp is reasonable, cotinue current meds  3. Hyperlipidemia - request pcp labs - continue statin  COVID-19 Education: The signs and symptoms of COVID-19 were discussed with the patient and how to seek care for testing (follow up with PCP or arrange E-visit).  The importance of social distancing was discussed today.  Time:   Today, I have spent 20 minutes with the patient with telehealth technology discussing the above problems.     Medication Adjustments/Labs and Tests Ordered: Current medicines are reviewed at length with the patient today.  Concerns regarding medicines are outlined above.   Tests Ordered: No orders of the defined types were placed in this encounter.   Medication Changes: No orders of the defined types were placed in this encounter.   Follow Up:  Either In Person or Virtual in 6 month(s)  Signed, Dina Rich, MD  08/12/2019 1:13 PM    [facility]

## 2019-08-12 NOTE — Patient Instructions (Signed)
Medication Instructions:  Your physician recommends that you continue on your current medications as directed. Please refer to the Current Medication list given to you today.  *If you need a refill on your cardiac medications before your next appointment, please call your pharmacy*   Lab Work: NONE  If you have labs (blood work) drawn today and your tests are completely normal, you will receive your results only by: . MyChart Message (if you have MyChart) OR . A paper copy in the mail If you have any lab test that is abnormal or we need to change your treatment, we will call you to review the results.   Testing/Procedures: NONE   Follow-Up: At [facility], you and your health needs are our priority.  As part of our continuing mission to provide you with exceptional heart care, we have created designated Provider Care Teams.  These Care Teams include your primary Cardiologist (physician) and Advanced Practice Providers (APPs -  Physician Assistants and Nurse Practitioners) who all work together to provide you with the care you need, when you need it.  We recommend signing up for the patient portal called "MyChart".  Sign up information is provided on this After Visit Summary.  MyChart is used to connect with patients for Virtual Visits (Telemedicine).  Patients are able to view lab/test results, encounter notes, upcoming appointments, etc.  Non-urgent messages can be sent to your provider as well.   To learn more about what you can do with MyChart, go to [email or web address].    Your next appointment:   6 month(s)  The format for your next appointment:   Either In Person or Virtual  Provider:   Jonathan Branch, MD   Other Instructions Thank you for choosing [facility]!    

## 2019-08-16 ENCOUNTER — Telehealth: Payer: Self-pay | Admitting: *Deleted

## 2019-08-16 NOTE — Telephone Encounter (Signed)
No answer no VM    Can we let patient know that we heard back from her pulmonary team, they think pulmonary rehab would be a good idea as well. If patient is intersted can place referal to [facility] for pulm rehab for emphysema    Dominga Ferry MD

## 2020-02-28 ENCOUNTER — Other Ambulatory Visit: Payer: Self-pay

## 2020-02-28 ENCOUNTER — Ambulatory Visit (INDEPENDENT_AMBULATORY_CARE_PROVIDER_SITE_OTHER): Payer: Managed Care, Other (non HMO) | Admitting: Cardiology

## 2020-02-28 ENCOUNTER — Telehealth: Payer: Self-pay | Admitting: Cardiology

## 2020-02-28 ENCOUNTER — Encounter: Payer: Self-pay | Admitting: Cardiology

## 2020-02-28 VITALS — BP 118/70 | HR 60 | Ht <= 58 in | Wt 213.8 lb

## 2020-02-28 DIAGNOSIS — G4733 Obstructive sleep apnea (adult) (pediatric): Secondary | ICD-10-CM

## 2020-02-28 DIAGNOSIS — J449 Chronic obstructive pulmonary disease, unspecified: Secondary | ICD-10-CM

## 2020-02-28 DIAGNOSIS — R0600 Dyspnea, unspecified: Secondary | ICD-10-CM

## 2020-02-28 DIAGNOSIS — R0609 Other forms of dyspnea: Secondary | ICD-10-CM

## 2020-02-28 MED ORDER — LOSARTAN POTASSIUM 50 MG PO TABS: 50.0000 mg | ORAL_TABLET | Freq: Every day | ORAL | 11 refills | Status: DC

## 2020-02-28 MED ORDER — FUROSEMIDE 40 MG PO TABS: 40.0000 mg | ORAL_TABLET | Freq: Every day | ORAL | 3 refills | Status: DC

## 2020-02-28 NOTE — Telephone Encounter (Signed)
New message     *STAT* If patient is at the pharmacy, call can be transferred to refill team.   1. Which medications need to be refilled? (please list name of each medication and dose if known) lasik -her prescription is 69 years old   2. Which pharmacy/location (including street and city if local pharmacy) is medication to be sent to? [facility]   3. Do they need a 30 day or 90 day supply? 90

## 2020-02-28 NOTE — Assessment & Plan Note (Signed)
She reports compliance with her C-pap

## 2020-02-28 NOTE — Assessment & Plan Note (Signed)
Followed at [facility]

## 2020-02-28 NOTE — Telephone Encounter (Signed)
Refill complete 

## 2020-02-28 NOTE — Patient Instructions (Signed)
Medication Instructions:  Your physician has recommended you make the following change in your medication:  Stop Taking Lisinopril  Restart Taking Lasix  Start Taking Losartan 50 mg Daily   *If you need a refill on your cardiac medications before your next appointment, please call your pharmacy*   Lab Work: NONE   If you have labs (blood work) drawn today and your tests are completely normal, you will receive your results only by: Marland Kitchen MyChart Message (if you have MyChart) OR . A paper copy in the mail If you have any lab test that is abnormal or we need to change your treatment, we will call you to review the results.   Testing/Procedures: NONE   Follow-Up: At [facility], you and your health needs are our priority.  As part of our continuing mission to provide you with exceptional heart care, we have created designated Provider Care Teams.  These Care Teams include your primary Cardiologist (physician) and Advanced Practice Providers (APPs -  Physician Assistants and Nurse Practitioners) who all work together to provide you with the care you need, when you need it.  We recommend signing up for the patient portal called "MyChart".  Sign up information is provided on this After Visit Summary.  MyChart is used to connect with patients for Virtual Visits (Telemedicine).  Patients are able to view lab/test results, encounter notes, upcoming appointments, etc.  Non-urgent messages can be sent to your provider as well.   To learn more about what you can do with MyChart, go to [email or web address].    Your next appointment:   4 week(s)  The format for your next appointment:   In Person  Provider:   Randall An, PA-C or Jacolyn Reedy, PA-C   Other Instructions Thank you for choosing [facility]!

## 2020-02-28 NOTE — Assessment & Plan Note (Signed)
BMI 45 

## 2020-02-28 NOTE — Progress Notes (Signed)
Cardiology Office Note:    Date:  02/28/2020   ID:  Lynn Dixon, DOB [birth year], MRN [id number]  PCP:  Juliette Alcide, MD  Cardiologist:  Dina Rich, MD  Electrophysiologist:  None   Referring MD: Juliette Alcide, MD   No chief complaint on file.   History of Present Illness:    Lynn Dixon is a pleasant, morbidly obese 69 y.o. female with a hx of HTN, HLD, Type 2 DM, COPD, and OSA (on CPAP).  She has had problems with DOE which has been chronic, worse lately.  She tells me her O2 sat when she walked into her PCPs office was in the 80's.  After she sits for a few minutes it comes up to normal.   Prior pulmonary function testing showed normal lung function with no airflow obstruction or restriction.  DLCO was improved from 2020.  FEV1 114%, ratio 78, FVC 111%, no significant bronchodilator response, DLCO 84%.  (2020 DLCO was 63%). She had a right and left heart cath in Jan 2020 that showed mild pulmonary hypertension, preserved EF, and normal coronary arteries.  CTA of her chest November 2019 showed no PE, + emphysema.  When I reviewed her medications with her in detail today she admitted she doesn't take her Furosemide because it makes her go to the bathroom frequently.   Past Medical History:  Diagnosis Date  . Abnormal stress test   . Allergy   . Arthritis    BACK  . Chronic neck pain   . Constipation    takes Sennokot daily  . COPD (chronic obstructive pulmonary disease) (HCC)   . Depression    PTSD-takes Zoloft daily  . Diabetes mellitus without complication (HCC)    diet controled  . GERD (gastroesophageal reflux disease)   . Glaucoma    mild in left eye  . H/O hiatal hernia   . History of blood transfusion   . History of bronchitis   . History of cardiac cath    a. normal cors by cath in 04/2018 following an abnormal NST.  Marland Kitchen History of colon polyps    benign  . Hyperlipidemia   . Hypertension   . Morbid obesity (HCC)   . Sleep apnea    USES CPAP    . Weakness    numbness and tingling. Takes Gabapentin daily    Past Surgical History:  Procedure Laterality Date  . ANTERIOR CERVICAL DECOMP/DISCECTOMY FUSION N/A 07/23/2016   Procedure: CERVICAL THREE-FOUR, CERVICAL FOUR-FIVE, CERVICAL FIVE-SIX ANTERIOR CERVICAL DECOMPRESSION/DISCECTOMY FUSION;  Surgeon: Julio Sicks, MD;  Location: [facility] OR;  Service: Neurosurgery;  Laterality: N/A;  . APPENDECTOMY    . BACK SURGERY     x 4  . BREAST REDUCTION SURGERY    . CARPEL TUNNEL Bilateral   . CATARACT EXTRACTION, BILATERAL  spring 2018   Dr. Darrick Grinder  . CHOLECYSTECTOMY    . COLONOSCOPY    . ESOPHAGOGASTRODUODENOSCOPY    . LUMBAR LAMINECTOMY/DECOMPRESSION MICRODISCECTOMY Left 12/03/2012   Procedure: Left Lumbar Four to Five Diskectomy;  Surgeon: Karn Cassis, MD;  Location: [facility] NEURO ORS;  Service: Neurosurgery;  Laterality: Left;  LUMBAR LAMINECTOMY/DECOMPRESSION MICRODISCECTOMY 1 LEVEL  . RIGHT/LEFT HEART CATH AND CORONARY ANGIOGRAPHY N/A 05/12/2018   Procedure: RIGHT/LEFT HEART CATH AND CORONARY ANGIOGRAPHY;  Surgeon: Lennette Bihari, MD;  Location: [facility] INVASIVE CV LAB;  Service: Cardiovascular;  Laterality: N/A;  . TOTAL KNEE ARTHROPLASTY Right   . TOTAL KNEE ARTHROPLASTY Left 02/27/2017   Procedure: LEFT TOTAL  KNEE ARTHROPLASTY;  Surgeon: Earlie Server, MD;  Location: [place];  Service: Orthopedics;  Laterality: Left;    Current Medications: Current Meds  Medication Sig  . albuterol (VENTOLIN HFA) 108 (90 Base) MCG/ACT inhaler Inhale 2 puffs into the lungs every 6 (six) hours as needed for wheezing or shortness of breath.  . ARIPiprazole (ABILIFY) 2 MG tablet   . aspirin EC 81 MG tablet Take 81 mg by mouth daily.  . calcium-vitamin D (OSCAL WITH D) 500-200 MG-UNIT tablet Take 1 tablet by mouth daily with breakfast.  . cetirizine (ZYRTEC) 10 MG tablet Take 10 mg by mouth daily.  . eszopiclone (LUNESTA) 2 MG TABS tablet Take 2 mg by mouth at bedtime as needed for sleep. Take immediately  before bedtime  . Fluticasone-Umeclidin-Vilant (TRELEGY ELLIPTA) 100-62.5-25 MCG/INH AEPB Inhale 1 puff into the lungs daily.  Marland Kitchen gabapentin (NEURONTIN) 300 MG capsule Take 900 mg by mouth 3 (three) times daily.   Marland Kitchen GLUCOSAMINE-CHONDROITIN PO Take 1 tablet by mouth at bedtime.   Marland Kitchen HYDROcodone-acetaminophen (NORCO/VICODIN) 5-325 MG tablet Take 1 tablet by mouth 3 (three) times daily as needed for moderate pain.  . Ipratropium-Albuterol (COMBIVENT IN) Inhale into the lungs.  . miconazole (MICOTIN) 2 % cream Apply 1 application topically 2 (two) times daily as needed (fungus).  . montelukast (SINGULAIR) 10 MG tablet Take 10 mg by mouth daily.   . Multiple Vitamin (MULTIVITAMIN WITH MINERALS) TABS tablet Take 1 tablet by mouth daily.  . naproxen sodium (ALEVE) 220 MG tablet Take 440 mg by mouth daily as needed (pain).  Karma Greaser 4 MG/0.1ML LIQD nasal spray kit SMARTSIG:Spray(s) Both Nares  . nitroGLYCERIN (NITROSTAT) 0.4 MG SL tablet Place 1 tablet (0.4 mg total) under the tongue every 5 (five) minutes as needed.  . Omega-3 Fatty Acids (FISH OIL PO) Take 1,500 mg by mouth daily.  Marland Kitchen omeprazole (PRILOSEC) 20 MG capsule Take 40 mg by mouth daily before breakfast.   . sertraline (ZOLOFT) 100 MG tablet Take 100 mg by mouth 2 (two) times daily.   . simvastatin (ZOCOR) 80 MG tablet Take 40 mg by mouth every evening.  . talc (ZEASORB) powder Apply 1 application topically as needed (moisture).  . [DISCONTINUED] furosemide (LASIX) 40 MG tablet Take 40 mg by mouth daily.  . [DISCONTINUED] lisinopril (PRINIVIL,ZESTRIL) 40 MG tablet Take 20 mg by mouth daily.     Allergies:   Coconut flavor, Cephalosporins, Morphine and related, Sulfa antibiotics, Sulfasalazine, Ketorolac, Cefuroxime axetil, and Estrogens   Social History   Socioeconomic History  . Marital status: Married    Spouse name: Not on file  . Number of children: Not on file  . Years of education: Not on file  . Highest education level: Not on  file  Occupational History  . Not on file  Tobacco Use  . Smoking status: Former Smoker    Packs/day: 2.00    Years: 40.00    Pack years: 80.00    Types: Cigarettes    Quit date: 05/17/2004    Years since quitting: 15.7  . Smokeless tobacco: Never Used  Vaping Use  . Vaping Use: Never used  Substance and Sexual Activity  . Alcohol use: No  . Drug use: No  . Sexual activity: Not on file  Other Topics Concern  . Not on file  Social History Narrative  . Not on file   Social Determinants of Health   Financial Resource Strain:   . Difficulty of Paying Living Expenses: Not on file  Food Insecurity:   . Worried About Charity fundraiser in the Last Year: Not on file  . Ran Out of Food in the Last Year: Not on file  Transportation Needs:   . Lack of Transportation (Medical): Not on file  . Lack of Transportation (Non-Medical): Not on file  Physical Activity:   . Days of Exercise per Week: Not on file  . Minutes of Exercise per Session: Not on file  Stress:   . Feeling of Stress : Not on file  Social Connections:   . Frequency of Communication with Friends and Family: Not on file  . Frequency of Social Gatherings with Friends and Family: Not on file  . Attends Religious Services: Not on file  . Active Member of Clubs or Organizations: Not on file  . Attends Archivist Meetings: Not on file  . Marital Status: Not on file     Family History: The patient's family history includes Emphysema in her father; Heart disease in her mother; Pneumonia in her father; Stroke in her maternal grandmother and mother.  ROS:   Please see the history of present illness.     All other systems reviewed and are negative.  EKGs/Labs/Other Studies Reviewed:    The following studies were reviewed today: Echo 04/05/2018- Study Conclusions   - Left ventricle: The cavity size was normal. Wall thickness was  normal. Systolic function was normal. The estimated ejection  fraction was  in the range of 55% to 60%. Wall motion was normal;  there were no regional wall motion abnormalities. Indeterminate  diastolic function.  - Aortic valve: Mildly calcified annulus. Trileaflet.  - Mitral valve: Mildly calcified annulus. There was trivial  regurgitation.  - Right atrium: Central venous pressure (est): 3 mm Hg.  - Tricuspid valve: There was trivial regurgitation.  - Pulmonary arteries: Systolic pressure could not be accurately  estimated.  - Pericardium, extracardiac: A prominent pericardial fat pad was  present.   Rt and Lt Cath 05/12/2018- Mild right heart pressure elevation with mild pulmonary hypertension.  Normal LV function without focal segmental wall motion abnormalities; LVEDP 21 mmHg  Normal coronary arteries.  RECOMMENDATION: Medical therapy.  Suspect the patient's underlying COPD may be contributing to her exertional dyspnea.   EKG:  EKG is not ordered today.  The ekg ordered Jan 2020 showed  NSR, HR 63  Recent Labs: No results found for requested labs within last 8760 hours.  Recent Lipid Panel No results found for: CHOL, TRIG, HDL, CHOLHDL, VLDL, LDLCALC, LDLDIRECT  Physical Exam:    VS:  BP 118/70   Pulse 60   Ht 4' 9.5" (1.461 m)   Wt 213 lb 12.8 oz (97 kg)   SpO2 95%   BMI 45.46 kg/m     Wt Readings from Last 3 Encounters:  02/28/20 213 lb 12.8 oz (97 kg)  08/12/19 203 lb (92.1 kg)  05/18/19 213 lb 3.2 oz (96.7 kg)     GEN: Morbidly obese female, short of breath with minimal exertion but in no acute distress HEENT: Normal NECK: No JVD; No carotid bruits CARDIAC: RRR, no murmurs, rubs, gallops RESPIRATORY:  Clear to auscultation without rales, wheezing or rhonchi  ABDOMEN: Obese non-distended MUSCULOSKELETAL:  No edema; No deformity  SKIN: Warm and dry NEUROLOGIC:  Alert and oriented x 3 PSYCHIATRIC:  Normal affect   ASSESSMENT:    Exertional dyspnea There does not seem to be a single factor causing this, most  likely she has multifactorial dyspnea.  She may have some diastolic CHF with volume overload as she does not take her Lasix, though her weight is not changed and she has no clear CHF on exam. I suggested she resume her Lasix and I also changed her Lisinopril to Losartan on the chance her ACE-I may be causing some airway reactivity.  I encouraged her to keep her follow up with pulmonary as scheduled.   COPD (chronic obstructive pulmonary disease) Followed at [facility]  Morbid obesity (HCC) BMI 45  OSA (obstructive sleep apnea) She reports compliance with her C-pap  PLAN:    Resume lasix 40 mg PO daily.  Change lisinopril 40 mg to Losartan 50 mg daily.  Keep f/u with [facility] as scheduled.    Medication Adjustments/Labs and Tests Ordered: Current medicines are reviewed at length with the patient today.  Concerns regarding medicines are outlined above.  No orders of the defined types were placed in this encounter.  Meds ordered this encounter  Medications  . losartan (COZAAR) 50 MG tablet    Sig: Take 1 tablet (50 mg total) by mouth daily.    Dispense:  30 tablet    Refill:  11    Patient Instructions  Medication Instructions:  Your physician has recommended you make the following change in your medication:  Stop Taking Lisinopril  Restart Taking Lasix  Start Taking Losartan 50 mg Daily   *If you need a refill on your cardiac medications before your next appointment, please call your pharmacy*   Lab Work: NONE   If you have labs (blood work) drawn today and your tests are completely normal, you will receive your results only by: Marland Kitchen MyChart Message (if you have MyChart) OR . A paper copy in the mail If you have any lab test that is abnormal or we need to change your treatment, we will call you to review the results.   Testing/Procedures: NONE   Follow-Up: At [facility], you and your health needs are our priority.  As part of our continuing mission to  provide you with exceptional heart care, we have created designated Provider Care Teams.  These Care Teams include your primary Cardiologist (physician) and Advanced Practice Providers (APPs -  Physician Assistants and Nurse Practitioners) who all work together to provide you with the care you need, when you need it.  We recommend signing up for the patient portal called "MyChart".  Sign up information is provided on this After Visit Summary.  MyChart is used to connect with patients for Virtual Visits (Telemedicine).  Patients are able to view lab/test results, encounter notes, upcoming appointments, etc.  Non-urgent messages can be sent to your provider as well.   To learn more about what you can do with MyChart, go to [email or web address].    Your next appointment:   4 week(s)  The format for your next appointment:   In Person  Provider:   Bernerd Pho, PA-C or Ermalinda Barrios, PA-C   Other Instructions Thank you for choosing [facility]!       Signed, Kerin Ransom, PA-C  02/28/2020 1:30 PM    [facility]

## 2020-02-28 NOTE — Assessment & Plan Note (Addendum)
There does not seem to be a single factor causing this, most likely she has multifactorial dyspnea.  She may have some diastolic CHF with volume overload as she does not take her Lasix, though her weight is not changed and she has no clear CHF on exam. I suggested she resume her Lasix and I also changed her Lisinopril to Losartan on the chance her ACE-I may be causing some airway reactivity.  I encouraged her to keep her follow up with pulmonary as scheduled.

## 2020-03-27 ENCOUNTER — Ambulatory Visit: Payer: Managed Care, Other (non HMO) | Admitting: Student

## 2020-05-01 ENCOUNTER — Ambulatory Visit: Payer: Managed Care, Other (non HMO) | Admitting: Student

## 2020-05-15 NOTE — Progress Notes (Signed)
Cardiology Office Note:    Date:  05/16/2020   ID:  Lynn Dixon, DOB 02/03/1951, MRN [id number]  PCP:  Lynn Alcide, MD  [facility] Cardiologist:  Lynn Rich, MD  [facility] Electrophysiologist:  None   Referring MD: Lynn Alcide, MD   Chief Complaint:  Follow-up (dyspnea)    Patient Profile:    Lynn Dixon is a 70 y.o. female with:   Hypertension   Hyperlipidemia   Diabetes mellitus   COPD/emphysema   OSA on CPAP w O2 at HS  Cath in 04/2018: no CAD  Mild pulmonary hypertension   Prior CV studies: Cardiac catheterization 05/12/18 Mild right heart pressure elevation with mild pulmonary hypertension.  Mean PA 24 Normal LV function without focal segmental wall motion abnormalities; LVEDP 21 mmHg Normal coronary arteries.  Echocardiogram 04/05/18 EF 55-60, no RWMA, trivial MR, trivial TR, pericardial fat pad   Myoview 03/29/18 [facility]  EF 48, ant scar w peri-infarct ischemia, intermediate risk    History of Present Illness:    Lynn Dixon was last seen in 11/21 by Lynn Shelter, PA-C with worsening dyspnea.  She was not taking her Furosemide and this was resumed.  Her ACE was also ? to an ARB.  She returns for f/u.  She is here alone.  She recently had symptoms of COPD exacerbation and was placed on steroids by her PCP.  She notes her breathing continues to get worse.  She is short of breath with just minimal activity.  She also notes left-sided chest discomfort for the past 4 to 5 months.  This is more of a soreness and is brought on by outpatient as well as movement of her left arm.  She does have decreased mobilit at her left shoulder.  She has not had syncope, leg edema.  She does not have orthopnea.      Past Medical History:  Diagnosis Date  . Abnormal stress test   . Allergy   . Arthritis    BACK  . Chronic neck pain   . Constipation    takes Sennokot daily  . COPD (chronic obstructive pulmonary disease) (HCC)   . Depression     PTSD-takes Zoloft daily  . Diabetes mellitus without complication (HCC)    diet controled  . GERD (gastroesophageal reflux disease)   . Glaucoma    mild in left eye  . H/O hiatal hernia   . History of blood transfusion   . History of bronchitis   . History of cardiac cath    a. normal cors by cath in 04/2018 following an abnormal NST.  Marland Kitchen History of colon polyps    benign  . Hyperlipidemia   . Hypertension   . Morbid obesity (HCC)   . Sleep apnea    USES CPAP  . Weakness    numbness and tingling. Takes Gabapentin daily    Current Medications: Current Meds  Medication Sig  . albuterol (VENTOLIN HFA) 108 (90 Base) MCG/ACT inhaler Inhale 2 puffs into the lungs every 6 (six) hours as needed for wheezing or shortness of breath.  . ARIPiprazole (ABILIFY) 2 MG tablet   . aspirin EC 81 MG tablet Take 81 mg by mouth daily.  . calcium-vitamin D (OSCAL WITH D) 500-200 MG-UNIT tablet Take 1 tablet by mouth daily with breakfast.  . cetirizine (ZYRTEC) 10 MG tablet Take 10 mg by mouth daily.  . eszopiclone (LUNESTA) 2 MG TABS tablet Take 2 mg by mouth at bedtime as needed  for sleep. Take immediately before bedtime  . Fluticasone-Umeclidin-Vilant (TRELEGY ELLIPTA) 100-62.5-25 MCG/INH AEPB Inhale 1 puff into the lungs daily.  . furosemide (LASIX) 40 MG tablet Take 1 tablet (40 mg total) by mouth daily.  Marland Kitchen gabapentin (NEURONTIN) 300 MG capsule Take 900 mg by mouth 3 (three) times daily.   Marland Kitchen GLUCOSAMINE-CHONDROITIN PO Take 1 tablet by mouth at bedtime.   Marland Kitchen HYDROcodone-acetaminophen (NORCO/VICODIN) 5-325 MG tablet Take 1 tablet by mouth 3 (three) times daily as needed for moderate pain.  . Ipratropium-Albuterol (COMBIVENT IN) Inhale into the lungs.  Marland Kitchen losartan (COZAAR) 50 MG tablet Take 1 tablet (50 mg total) by mouth daily.  . miconazole (MICOTIN) 2 % cream Apply 1 application topically 2 (two) times daily as needed (fungus).  . montelukast (SINGULAIR) 10 MG tablet Take 10 mg by mouth daily.   .  Multiple Vitamin (MULTIVITAMIN WITH MINERALS) TABS tablet Take 1 tablet by mouth daily.  Marland Kitchen NARCAN 4 MG/0.1ML LIQD nasal spray kit SMARTSIG:Spray(s) Both Nares  . nitroGLYCERIN (NITROSTAT) 0.4 MG SL tablet Place 1 tablet (0.4 mg total) under the tongue every 5 (five) minutes as needed.  . Omega-3 Fatty Acids (FISH OIL PO) Take 1,500 mg by mouth daily.  Marland Kitchen omeprazole (PRILOSEC) 20 MG capsule Take 40 mg by mouth daily before breakfast.   . sertraline (ZOLOFT) 100 MG tablet Take 100 mg by mouth 2 (two) times daily.   . simvastatin (ZOCOR) 80 MG tablet Take 40 mg by mouth every evening.  . talc powder Apply 1 application topically as needed (moisture).     Allergies:   Coconut flavor, Cephalosporins, Morphine and related, Sulfa antibiotics, Sulfasalazine, Ketorolac, Cefuroxime axetil, and Estrogens   Social History   Tobacco Use  . Smoking status: Former Smoker    Packs/day: 2.00    Years: 40.00    Pack years: 80.00    Types: Cigarettes    Quit date: 05/17/2004    Years since quitting: 16.0  . Smokeless tobacco: Never Used  Vaping Use  . Vaping Use: Never used  Substance Use Topics  . Alcohol use: No  . Drug use: No     Family Hx: The patient's family history includes Emphysema in her father; Heart disease in her mother; Pneumonia in her father; Stroke in her maternal grandmother and mother.  ROS   EKGs/Labs/Other Test Reviewed:    EKG:  EKG is NOT ordered today.  The ekg ordered today demonstrates n/a  Recent Labs: No results found for requested labs within last 8760 hours.   Recent Lipid Panel No results found for: CHOL, TRIG, HDL, CHOLHDL, LDLCALC, LDLDIRECT    Risk Assessment/Calculations:      Physical Exam:    VS:  BP 136/80   Pulse 77   Ht 4' 9.5" (1.461 m)   Wt 216 lb 9.6 oz (98.2 kg)   SpO2 93%   BMI 46.06 kg/m     Wt Readings from Last 3 Encounters:  05/16/20 216 lb 9.6 oz (98.2 kg)  02/28/20 213 lb 12.8 oz (97 kg)  08/12/19 203 lb (92.1 kg)      Constitutional:      Appearance: Healthy appearance. Not in distress.  Neck:     Vascular: No JVR.  Pulmonary:     Effort: Pulmonary effort is normal.     Breath sounds: No wheezing. No rales.  Cardiovascular:     Normal rate. Regular rhythm. Normal S1. Normal S2.     Murmurs: There is no murmur.  Edema:  Peripheral edema absent.  Abdominal:     Palpations: Abdomen is soft.  Skin:    General: Skin is warm and dry.  Neurological:     Mental Status: Alert and oriented to person, place and time.     Cranial Nerves: Cranial nerves are intact.       ASSESSMENT & PLAN:    1. Dyspnea on exertion She likely has multifactorial dyspnea.  It is not clear if volume excess is a major contributor.  She was placed on furosemide at last visit.  Overall, her breathing continues to get worse.  I suspect her COPD is a major contributor as well as her obesity.  She has not seen pulmonology in almost a year.  Her exam does not suggest volume excess.  I will obtain a BNP.  She has labs pending later today with her PCP.  We will try to add a BNP onto those labs.  I will also repeat her echocardiogram to reassess her right heart pressures.  I have also suggested that she arrange earlier follow-up with pulmonology.  Question if she would benefit from pulmonary rehab.  2. Pulmonary hypertension, unspecified ([place]) Mild pulmonary hypertension by right heart catheterization in 04/2018.  As her dyspnea continues to get worse, I will arrange a follow-up echocardiogram as noted above.  3. Chronic obstructive pulmonary disease, unspecified COPD type ([place]) Continue current therapy and follow-up with pulmonology as outlined above.  4. OSA (obstructive sleep apnea) She remains on CPAP with O2 at night.  5. Essential hypertension Fair control.  Continue current therapy.    Dispo:  Return in about 6 months (around 11/13/2020) for Routine Follow Up w Dr. Harl Bowie.   Medication Adjustments/Labs and Tests  Ordered: Current medicines are reviewed at length with the patient today.  Concerns regarding medicines are outlined above.  Tests Ordered: Orders Placed This Encounter  Procedures  . Pro b natriuretic peptide (BNP)  . ECHOCARDIOGRAM COMPLETE   Medication Changes: No orders of the defined types were placed in this encounter.   Signed, Richardson Dopp, PA-C  05/16/2020 4:57 PM    [facility] [address], [place], [place]  [address] Phone: [phone]; Fax: [phone]

## 2020-05-16 ENCOUNTER — Encounter: Payer: Self-pay | Admitting: Physician Assistant

## 2020-05-16 ENCOUNTER — Other Ambulatory Visit: Payer: Self-pay

## 2020-05-16 ENCOUNTER — Ambulatory Visit (INDEPENDENT_AMBULATORY_CARE_PROVIDER_SITE_OTHER): Payer: Managed Care, Other (non HMO) | Admitting: Physician Assistant

## 2020-05-16 VITALS — BP 136/80 | HR 77 | Ht <= 58 in | Wt 216.6 lb

## 2020-05-16 DIAGNOSIS — I272 Pulmonary hypertension, unspecified: Secondary | ICD-10-CM

## 2020-05-16 DIAGNOSIS — G4733 Obstructive sleep apnea (adult) (pediatric): Secondary | ICD-10-CM | POA: Diagnosis not present

## 2020-05-16 DIAGNOSIS — J449 Chronic obstructive pulmonary disease, unspecified: Secondary | ICD-10-CM

## 2020-05-16 DIAGNOSIS — R0600 Dyspnea, unspecified: Secondary | ICD-10-CM | POA: Diagnosis not present

## 2020-05-16 DIAGNOSIS — R0609 Other forms of dyspnea: Secondary | ICD-10-CM

## 2020-05-16 DIAGNOSIS — I10 Essential (primary) hypertension: Secondary | ICD-10-CM

## 2020-05-16 NOTE — Patient Instructions (Addendum)
Medication Instructions:  *If you need a refill on your cardiac medications before your next appointment, please call your pharmacy*  Testing/Procedures: Your physician has requested that you have an echocardiogram. Echocardiography is a painless test that uses sound waves to create images of your heart. It provides your doctor with information about the size and shape of your heart and how well your heart's chambers and valves are working. This procedure takes approximately one hour. There are no restrictions for this procedure.  Follow-Up: At [facility], you and your health needs are our priority.  As part of our continuing mission to provide you with exceptional heart care, we have created designated Provider Care Teams.  These Care Teams include your primary Cardiologist (physician) and Advanced Practice Providers (APPs -  Physician Assistants and Nurse Practitioners) who all work together to provide you with the care you need, when you need it.  We recommend signing up for the patient portal called "MyChart".  Sign up information is provided on this After Visit Summary.  MyChart is used to connect with patients for Virtual Visits (Telemedicine).  Patients are able to view lab/test results, encounter notes, upcoming appointments, etc.  Non-urgent messages can be sent to your provider as well.   To learn more about what you can do with MyChart, go to [email or web address].    Your next appointment:   Your physician recommends that you schedule a follow-up appointment in: 6 MONTHS with Dr. Wyline Mood  The format for your next appointment:   In Person with Dina Rich, MD   -- Please call your Pulmonary Doctor to schedule a follow up regarding your shortness of breath --

## 2020-05-22 ENCOUNTER — Other Ambulatory Visit: Payer: Self-pay

## 2020-05-22 ENCOUNTER — Encounter: Payer: Self-pay | Admitting: Physician Assistant

## 2020-05-22 ENCOUNTER — Ambulatory Visit (HOSPITAL_COMMUNITY)
Admission: RE | Admit: 2020-05-22 | Discharge: 2020-05-22 | Disposition: A | Discharge status: Home / Self Care | Payer: Managed Care, Other (non HMO) | Source: Ambulatory Visit | Attending: Cardiology | Admitting: Cardiology

## 2020-05-22 DIAGNOSIS — R0600 Dyspnea, unspecified: Secondary | ICD-10-CM | POA: Insufficient documentation

## 2020-05-22 DIAGNOSIS — R0609 Other forms of dyspnea: Secondary | ICD-10-CM

## 2020-05-22 LAB — ECHOCARDIOGRAM COMPLETE
AR max vel: 1.69 cm2
AV Area VTI: 1.76 cm2
AV Area mean vel: 1.96 cm2
AV Mean grad: 7 mmHg
AV Peak grad: 15.7 mmHg
Ao pk vel: 1.98 m/s
Area-P 1/2: 2.9 cm2
S' Lateral: 2.9 cm

## 2020-05-22 NOTE — Progress Notes (Signed)
*  PRELIMINARY RESULTS* Echocardiogram 2D Echocardiogram has been performed.  [name] 05/22/2020, 10:22 AM

## 2020-05-25 ENCOUNTER — Telehealth: Payer: Self-pay | Admitting: Physician Assistant

## 2020-05-25 NOTE — Telephone Encounter (Signed)
Patient contacted and verbalized understanding. She has an appt with Pulmonology on 06/07/2020 at 11:30 am with Dr. Celine Mans.

## 2020-05-25 NOTE — Telephone Encounter (Signed)
05/24/2020: Pro-BNP 58.0  BNP is normal. Continue current medications. She should f/u with Pulmonology as discussed at her recent OV. Tereso Newcomer, PA-C    05/25/2020 9:26 AM

## 2020-06-07 ENCOUNTER — Ambulatory Visit (INDEPENDENT_AMBULATORY_CARE_PROVIDER_SITE_OTHER): Payer: Managed Care, Other (non HMO) | Admitting: Internal Medicine

## 2020-06-07 ENCOUNTER — Encounter: Payer: Self-pay | Admitting: Internal Medicine

## 2020-06-07 ENCOUNTER — Other Ambulatory Visit: Payer: Self-pay

## 2020-06-07 VITALS — BP 128/72 | HR 57 | Temp 97.4°F | Ht <= 58 in | Wt 222.2 lb

## 2020-06-07 DIAGNOSIS — Z9989 Dependence on other enabling machines and devices: Secondary | ICD-10-CM

## 2020-06-07 DIAGNOSIS — G4733 Obstructive sleep apnea (adult) (pediatric): Secondary | ICD-10-CM

## 2020-06-07 DIAGNOSIS — J9611 Chronic respiratory failure with hypoxia: Secondary | ICD-10-CM | POA: Diagnosis not present

## 2020-06-07 NOTE — Progress Notes (Signed)
Lynn Dixon    [id number]    [birth year]  Primary Care Physician:Burdine, Ananias Pilgrim, MD Date of Appointment: 06/07/2020 Established Patient Visit  Chief complaint:   Chief Complaint  Patient presents with  . Establish Care    Former PC for COPD. States her breathing has continued to get worse since last visit. Believes she needs to be on O2 at exertion. Denied any increased coughing or chest pain. Uses 4L at night with cpap.      HPI: Lynn Dixon is a 70 y.o. woman previously followed by Dr. Juanetta Gosling and later Dr. Chestine Spore for COPD.  80 pack year smoking history, quit in 2005. Has OSA on CPAP.   Interval Updates: Here for follow up for COPD. Worsening dyspnea over the last year now with minimal exertion including walking around the house, going to mailbox, going to the store. She is on trelegy inhaler which seems to help.  She takes albuterol rescue inhaler twice a day.  She also has chest tightness, wheezing cough. Had a URI last month which was treated with steroids and antibiotics.  Has needed prednisone 3 times in the last year. Chest congestion has gotten worse. She has also gained weight, about 20 lbs in the last 6 months. She is no longer going to the [facility] because she is too sob to work out.  Daughter is with her today.  DME - [employer]. Wears CPAP nightly with 3LNC bleed in. Current machine over 72 years old. Also has a concentrator at home for bleeding in and is through [place].   She also has yellow, thick mucus that she has trouble bringing up and also clears her throat frequently. She also has post nasal drainage. She takes cetirizine and montelukast, and over the counter but doesn't know what it's called, thinks it might be a steroid.   I have reviewed the patient's family social and past medical history and updated as appropriate.   Past Medical History:  Diagnosis Date  . Abnormal stress test   . Allergy   . Arthritis    BACK  . Chronic neck pain   .  Constipation    takes Sennokot daily  . COPD (chronic obstructive pulmonary disease) (HCC)   . Depression    PTSD-takes Zoloft daily  . Diabetes mellitus without complication (HCC)    diet controled  . GERD (gastroesophageal reflux disease)   . Glaucoma    mild in left eye  . H/O hiatal hernia   . History of blood transfusion   . History of bronchitis   . History of cardiac cath    a. normal cors by cath in 04/2018 following an abnormal NST.  Marland Kitchen History of colon polyps    benign  . History of echocardiogram    Echocardiogram 2/22: EF 65-70, no RWMA, normal diastolic function, normal RVSF, mild LAE, inadequate TR jet (indeterminate PASP)  . Hyperlipidemia   . Hypertension   . Morbid obesity (HCC)   . Sleep apnea    USES CPAP  . Weakness    numbness and tingling. Takes Gabapentin daily    Past Surgical History:  Procedure Laterality Date  . ANTERIOR CERVICAL DECOMP/DISCECTOMY FUSION N/A 07/23/2016   Procedure: CERVICAL THREE-FOUR, CERVICAL FOUR-FIVE, CERVICAL FIVE-SIX ANTERIOR CERVICAL DECOMPRESSION/DISCECTOMY FUSION;  Surgeon: Julio Sicks, MD;  Location: [facility] OR;  Service: Neurosurgery;  Laterality: N/A;  . APPENDECTOMY    . BACK SURGERY     x 4  .  BREAST REDUCTION SURGERY    . CARPEL TUNNEL Bilateral   . CATARACT EXTRACTION, BILATERAL  spring 2018   Dr. Darrick Grinder  . CHOLECYSTECTOMY    . COLONOSCOPY    . ESOPHAGOGASTRODUODENOSCOPY    . LUMBAR LAMINECTOMY/DECOMPRESSION MICRODISCECTOMY Left 12/03/2012   Procedure: Left Lumbar Four to Five Diskectomy;  Surgeon: Karn Cassis, MD;  Location: [facility] NEURO ORS;  Service: Neurosurgery;  Laterality: Left;  LUMBAR LAMINECTOMY/DECOMPRESSION MICRODISCECTOMY 1 LEVEL  . RIGHT/LEFT HEART CATH AND CORONARY ANGIOGRAPHY N/A 05/12/2018   Procedure: RIGHT/LEFT HEART CATH AND CORONARY ANGIOGRAPHY;  Surgeon: Lennette Bihari, MD;  Location: [facility] INVASIVE CV LAB;  Service: Cardiovascular;  Laterality: N/A;  . TOTAL KNEE ARTHROPLASTY Right   . TOTAL KNEE  ARTHROPLASTY Left 02/27/2017   Procedure: LEFT TOTAL KNEE ARTHROPLASTY;  Surgeon: Frederico Hamman, MD;  Location: [facility] OR;  Service: Orthopedics;  Laterality: Left;    Family History  Problem Relation Age of Onset  . Heart disease Mother   . Stroke Mother   . Emphysema Father   . Pneumonia Father   . Stroke Maternal Grandmother     Social History   Occupational History  . Not on file  Tobacco Use  . Smoking status: Former Smoker    Packs/day: 2.00    Years: 40.00    Pack years: 80.00    Types: Cigarettes    Quit date: 05/17/2004    Years since quitting: 16.0  . Smokeless tobacco: Never Used  Vaping Use  . Vaping Use: Never used  Substance and Sexual Activity  . Alcohol use: No  . Drug use: No  . Sexual activity: Not on file     Physical Exam: Blood pressure 128/72, pulse (!) 57, temperature (!) 97.4 F (36.3 C), temperature source Temporal, height 4' 9.5" (1.461 m), weight 222 lb 3.2 oz (100.8 kg), SpO2 94 %.  Gen:      Chronically ill appearing,  Lungs:    Diminished breath sounds, no respiratory distress, coughing with inspiration CV:         Regular rate and rhythm; no murmurs, rubs, or gallops.  No pedal edema   Data Reviewed: Imaging: I have personally reviewed the chest xray nov 2018 shows no acute cardiopulmonary process, cervical hardware in place  PFTs:  PFT Results Latest Ref Rng & Units 07/20/2019 06/09/2018  FVC-Pre L 2.59 2.73  FVC-Predicted Pre % 115 120  FVC-Post L 2.49 2.74  FVC-Predicted Post % 111 120  Pre FEV1/FVC % % 72 74  Post FEV1/FCV % % 78 72  FEV1-Pre L 1.87 2.03  FEV1-Predicted Pre % 111 118  FEV1-Post L 1.93 1.96  DLCO uncorrected ml/min/mmHg 13.03 9.78  DLCO [facility]% % 84 63  DLCO corrected ml/min/mmHg 13.03 -  DLCO COR %Predicted % 84 -  DLVA Predicted % 76 64  TLC L 4.35 5.03  TLC % Predicted % 106 123  RV % Predicted % 81 119   I have personally reviewed the patient's PFTs and they are normal spirometry, lung volumes, diffusion  capacity  Echocardiogram Feb 2021 shows:  1. Left ventricular ejection fraction, by estimation, is 65 to 70%. The  left ventricle has normal function. The left ventricle has no regional  wall motion abnormalities. Left ventricular diastolic parameters were  normal.  2. Right ventricular systolic function is normal. The right ventricular  size is normal.  3. Left atrial size was mildly dilated.  4. The mitral valve is normal in structure. No evidence of mitral valve  regurgitation. No evidence of mitral stenosis.  5. The aortic valve was not well visualized. There is mild calcification  of the aortic valve. There is mild thickening of the aortic valve. Aortic  valve regurgitation is not visualized. No aortic stenosis is present.   Labs:  Immunization status: Immunization History  Administered Date(s) Administered  . Fluad Quad(high Dose 65+) 03/17/2019  . Influenza,inj,Quad PF,6+ Mos 02/29/2016  . Moderna Sars-Covid-2 Vaccination 05/29/2019, 06/26/2019    Assessment:  COPD Gold Stage D OSA on CPAP, not well controlled. Notes leak and poorly fitting mask.  GERD on PPI Dyspnea, worsening - suspect multifactorial from obesity, COPD, inadequately treated OSA.  UACS - has chronic cough likely component of GERD with post nasal drainage.   Plan/Recommendations: Will order CPAP titration study as it has been over 10 years since she has had this machine and she has had significant weight gain. Attempted to obtain a download from [facility] today but she would need to bring the SD card into the company for review.   Continue PPI for GERD For COPD continue trelegy inhaler and prn albuterol.  Continue intranasal steroid, singulair and cetirizine for rhinitis  She is outside the window for lung cancer screening, since she quit over 15 years ago.  Will ambulate for home oxygen today - qualifies for home oxygen today. Needs 2LNC with exertion. Discussed pulmonary rehabilitation  as well.   I spent 45 minutes on 06/07/2020 in care of this patient including face to face time and non-face to face time spent charting, review of outside records, and coordination of care.   Return to Care: Return in about 3 months (around 09/04/2020).   Durel Salts, MD Pulmonary and Critical Care Medicine [facility] Office:[phone]

## 2020-06-07 NOTE — Patient Instructions (Signed)
The patient should have follow up scheduled with myself in 3 months.   Prior to next visit patient should have: CPAP titration study - to help get you a new CPAP machine  Continue Trelegy and albuterol inhaler.

## 2020-06-07 NOTE — Addendum Note (Signed)
Addended by: Maurene Capes on: 06/07/2020 12:41 PM   Modules accepted: Orders

## 2020-06-21 ENCOUNTER — Telehealth: Payer: Self-pay | Admitting: Internal Medicine

## 2020-06-21 NOTE — Telephone Encounter (Signed)
Called and spoke to pt. Pt states she received a letter from her insurance stating they may not pay for a sleep study because of there not being an indication for it. Advised pt I would reach out to our PCCs to see if an authorization happens prior to the cpap titration study she has scheduled for 3/21. Pt aware we will call back after hearing from [facility].    PCCs do you know if pt needs an authorization for this titration study? Pt worried her insurance may not pay for the sleep study. Thanks.

## 2020-06-21 NOTE — Telephone Encounter (Signed)
I have called and LM on VM for David.  Given the fax number to send that form to.  Will sign off of message

## 2020-06-21 NOTE — Telephone Encounter (Signed)
Called and spoke with pt and she is aware of the precert that was done for the sleep study.

## 2020-06-21 NOTE — Telephone Encounter (Signed)
We did get a precert for this sleep study auth#  [id number]

## 2020-06-21 NOTE — Telephone Encounter (Signed)
No I havent got anything they can fax it to [phone]

## 2020-06-21 NOTE — Telephone Encounter (Signed)
Johny Drilling any papers received for the POC on this pt.  Thanks

## 2020-06-22 ENCOUNTER — Encounter: Payer: Managed Care, Other (non HMO) | Admitting: Pulmonary Disease

## 2020-06-26 ENCOUNTER — Telehealth: Payer: Self-pay | Admitting: Internal Medicine

## 2020-06-26 NOTE — Telephone Encounter (Signed)
Disregard.  error

## 2020-06-28 ENCOUNTER — Telehealth: Payer: Self-pay | Admitting: Internal Medicine

## 2020-06-28 NOTE — Telephone Encounter (Signed)
Routing message to [facility] to see if they have received any paperwork of [employer]. Johny Drilling have you seen any of this paperwork?

## 2020-06-29 NOTE — Telephone Encounter (Signed)
This was faxed back on 06/26/20

## 2020-06-29 NOTE — Telephone Encounter (Signed)
Spoke with Lynn Dixon form [employer] who states he has all the needed paperwork and nothing further is needed at this time.

## 2020-07-02 ENCOUNTER — Other Ambulatory Visit: Payer: Self-pay

## 2020-07-02 ENCOUNTER — Ambulatory Visit: Payer: Managed Care, Other (non HMO) | Attending: Internal Medicine | Admitting: Pulmonary Disease

## 2020-07-02 DIAGNOSIS — G4733 Obstructive sleep apnea (adult) (pediatric): Secondary | ICD-10-CM | POA: Insufficient documentation

## 2020-07-02 DIAGNOSIS — R0902 Hypoxemia: Secondary | ICD-10-CM | POA: Diagnosis not present

## 2020-07-02 DIAGNOSIS — Z9981 Dependence on supplemental oxygen: Secondary | ICD-10-CM | POA: Diagnosis not present

## 2020-07-10 NOTE — Procedures (Signed)
Patient Name: Lynn Dixon, Lynn Dixon Date: 07/02/2020 Gender: Female D.O.B: [birth year] Age (years): 48 Referring Provider: Durel Salts MD Height (inches): 59 Interpreting Physician: Cyril Mourning MD, ABSM Weight (lbs): 241 RPSGT: Alfonso Ellis BMI: 49 MRN: [id number] Neck Size: 17.50 <br> <br> CLINICAL INFORMATION The patient is referred for a CPAP titration to reassess adequacy of sleep apnea therapy with weight gain. Known OSA with COPD on home O2    SLEEP STUDY TECHNIQUE As per the AASM Manual for the Scoring of Sleep and Associated Events v2.3 (April 2016) with a hypopnea requiring 4% desaturations.  The channels recorded and monitored were frontal, central and occipital EEG, electrooculogram (EOG), submentalis EMG (chin), nasal and oral airflow, thoracic and abdominal wall motion, anterior tibialis EMG, snore microphone, electrocardiogram, and pulse oximetry. Continuous positive airway pressure (CPAP) was initiated at the beginning of the study and titrated to treat sleep-disordered breathing.  MEDICATIONS Medications self-administered by patient taken the night of the study : N/A  TECHNICIAN COMMENTS Comments added by technician: Patient had difficulty initiating sleep. Patient tolearted CPAP therapy very well. CPAP pressure started at 4 cm of H20 and increased to 14 cm of H2O, mainly due to snoring. Heated humidification, chin strap and 3 LPM of O2 supplement added. 3L O2 started at the beginning of study. PLMS noticed through out study . EKG = Bradycardia. Suboptimal pressure obtained due to no REM-supine stage observed. Patient stated, she does not sleep in supine position. Patient snoring was moderate to loud, until 14 CM OF H2O setting then snoring was intermittently soft  Comments added by scorer: N/A RESPIRATORY PARAMETERS Optimal PAP Pressure (cm): 14 AHI at Optimal Pressure (/hr): 0 Overall Minimal O2 (%): 90.00 Supine % at Optimal Pressure (%): 0 Minimal O2 at Optimal  Pressure (%): 87.00   SLEEP ARCHITECTURE The study was initiated at 10:49:52 PM and ended at 5:49:24 AM.  Sleep onset time was 86.8 minutes and the sleep efficiency was 77.2%. The total sleep time was 323.8 minutes.  The patient spent 1.08% of the night in stage N1 sleep, 59.85% in stage N2 sleep, 28.11% in stage N3 and 11% in REM.Stage REM latency was 227.5 minutes  Wake after sleep onset was 9.0. Alpha intrusion was absent. Supine sleep was 3.58%.  CARDIAC DATA The 2 lead EKG demonstrated sinus rhythm. The mean heart rate was 53.39 beats per minute. Other EKG findings include: Atrial Fibrillation.  LEG MOVEMENT DATA The total Periodic Limb Movements of Sleep (PLMS) were 361. The PLMS index was 66.90. A PLMS index of <15 is considered normal in adults.  IMPRESSIONS - PAP pressure of 14 cm was selected for this patient based on the available study data. Note that supine or REM sleep was not noted at this level - Study was performed on 3 L O2. - Mild oxygen desaturations were observed during this titration (min O2 = 90.00%). - The patient snored with loud snoring volume during this titration study. - 2-lead EKG demonstrated: Atrial Fibrillation - Severe periodic limb movements were observed during this study. Arousals associated with PLMs were rare.   DIAGNOSIS - Obstructive Sleep Apnea (G47.33) - Nocturnal hypoxia   RECOMMENDATIONS - CPAP 14 cm with medium nasal mask. 3L O2 should be blended in. - Avoid alcohol, sedatives and other CNS depressants that may worsen sleep apnea and disrupt normal sleep architecture. - Sleep hygiene should be reviewed to assess factors that may improve sleep quality. - Weight management and regular exercise should be initiated or continued. - [facility] after 4 weeks of therapy   Cyril Mourning MD Board Certified in Sleep medicine

## 2020-07-11 ENCOUNTER — Telehealth: Payer: Self-pay | Admitting: Internal Medicine

## 2020-07-11 DIAGNOSIS — Z9989 Dependence on other enabling machines and devices: Secondary | ICD-10-CM

## 2020-07-11 DIAGNOSIS — G4733 Obstructive sleep apnea (adult) (pediatric): Secondary | ICD-10-CM

## 2020-07-11 NOTE — Telephone Encounter (Signed)
Needs CPAP machine and supplies. CPAP 14 cm with medium nasal mask. 3L O2 should be blended in.

## 2020-07-13 NOTE — Telephone Encounter (Signed)
Called and spoke with patient, she currently has a CPAP machine, she has only had it about 2 years, she uses [employer] for her supplies.  She already has oxygen at 3L that she bleeds into her CPAP at night.  I advised her that I would send an order to [employer] to make sure they have the correct settings.  Nothing further needed.  Dr. Celine Mans, Just an FYI, patient already has a CPAP machine with oxygen.  Thank you.

## 2020-11-14 ENCOUNTER — Ambulatory Visit: Payer: Managed Care, Other (non HMO) | Admitting: Cardiology

## 2020-11-14 NOTE — Progress Notes (Deleted)
Clinical Summary Ms. Lynn Dixon is a 70 y.o.female seen today for follow up of the following medical problems.    1. DOE/Chest pain - symptoms started with diaphoretic episodes with activity in the summer. Some tightness in chest, left sided. +SOB. 4/10 in severity. Would stop and rest with some improvement. Progressed over the last few months - finally discussed with pcp a few weeks ago. - 02/2018 CT PE negaitve per pcp notes - 03/29/2018 [facility]  nuclear stress test ordered by pcp: fixed distal anterior/apical defect. Mild basal anterior ischemia. LVEF 48% per nuclear     - worst over the last few weeks. With activity, example walking at grocery store holding onto buggy due to SOB, or vacuuming. Still with chest tightness, can be up 6-7/10 in severity. Can occur 2-3 times per days     - remote cath told small blockage nearly 10 years ago.   CAD risk factors: HTN, HL, former smoker x 40 years, mother with MI late 66s.       03/2018 echo LVEF 55-60%, no WMAs - Jan 2020 cath normal coronaries. Mean PA 24, PCWP 15, LVEDP 22   - some increased SOB. Occasional LE edema. Takes lasix prn to control her swelling. - sedentary lifestyle due to chronic knee pains.   05/2020 echo: LVEF 88-89%, normal diastolic function, normal RV 05/2020 PRO BNP 58 - was to see pulmonary. At last visit qualified for home O2      2. COPD - followed by pulmonary Dr Shearon Stalls   3. HTN - compliant with meds - at goal by 05/2019 clinic visit     4. HL - compliant with meds. - labs followed by pcp       Past Medical History:  Diagnosis Date   Abnormal stress test    Allergy    Arthritis    BACK   Chronic neck pain    Constipation    takes Sennokot daily   COPD (chronic obstructive pulmonary disease) (HCC)    Depression    PTSD-takes Zoloft daily   Diabetes mellitus without complication (HCC)    diet controled   GERD (gastroesophageal reflux disease)    Glaucoma    mild in left eye    H/O hiatal hernia    History of blood transfusion    History of bronchitis    History of cardiac cath    a. normal cors by cath in 04/2018 following an abnormal NST.   History of colon polyps    benign   History of echocardiogram    Echocardiogram 2/22: EF 65-70, no RWMA, normal diastolic function, normal RVSF, mild LAE, inadequate TR jet (indeterminate PASP)   Hyperlipidemia    Hypertension    Morbid obesity (HCC)    Sleep apnea    USES CPAP   Weakness    numbness and tingling. Takes Gabapentin daily     Allergies  Allergen Reactions   Coconut Flavor Anaphylaxis    Anything with coconut in it   Cephalosporins Hives   Morphine And Related     dizziness   Sulfa Antibiotics Hives and Rash   Sulfasalazine Hives and Rash   Ketorolac     Headaches and dizziness   Cefuroxime Axetil Itching and Rash   Estrogens Rash     Current Outpatient Medications  Medication Sig Dispense Refill   albuterol (VENTOLIN HFA) 108 (90 Base) MCG/ACT inhaler Inhale 2 puffs into the lungs every 6 (six) hours as needed for wheezing  or shortness of breath. 18 g 11   ARIPiprazole (ABILIFY) 2 MG tablet      aspirin EC 81 MG tablet Take 81 mg by mouth daily.     calcium-vitamin D (OSCAL WITH D) 500-200 MG-UNIT tablet Take 1 tablet by mouth daily with breakfast.     cetirizine (ZYRTEC) 10 MG tablet Take 10 mg by mouth daily.     eszopiclone (LUNESTA) 2 MG TABS tablet Take 2 mg by mouth at bedtime as needed for sleep. Take immediately before bedtime     Fluticasone-Umeclidin-Vilant (TRELEGY ELLIPTA) 100-62.5-25 MCG/INH AEPB Inhale 1 puff into the lungs daily. 1 each 11   furosemide (LASIX) 40 MG tablet Take 1 tablet (40 mg total) by mouth daily. 90 tablet 3   gabapentin (NEURONTIN) 300 MG capsule Take 900 mg by mouth 3 (three) times daily.      GLUCOSAMINE-CHONDROITIN PO Take 1 tablet by mouth at bedtime.      HYDROcodone-acetaminophen (NORCO/VICODIN) 5-325 MG tablet Take 1 tablet by mouth 3 (three) times  daily as needed for moderate pain.     Ipratropium-Albuterol (COMBIVENT IN) Inhale into the lungs.     losartan (COZAAR) 50 MG tablet Take 1 tablet (50 mg total) by mouth daily. 30 tablet 11   miconazole (MICOTIN) 2 % cream Apply 1 application topically 2 (two) times daily as needed (fungus).     montelukast (SINGULAIR) 10 MG tablet Take 10 mg by mouth daily.      Multiple Vitamin (MULTIVITAMIN WITH MINERALS) TABS tablet Take 1 tablet by mouth daily.     NARCAN 4 MG/0.1ML LIQD nasal spray kit SMARTSIG:Spray(s) Both Nares     nitroGLYCERIN (NITROSTAT) 0.4 MG SL tablet Place 1 tablet (0.4 mg total) under the tongue every 5 (five) minutes as needed. 25 tablet 3   Omega-3 Fatty Acids (FISH OIL PO) Take 1,500 mg by mouth daily.     omeprazole (PRILOSEC) 20 MG capsule Take 40 mg by mouth daily before breakfast.      sertraline (ZOLOFT) 100 MG tablet Take 100 mg by mouth 2 (two) times daily.      simvastatin (ZOCOR) 80 MG tablet Take 40 mg by mouth every evening.     talc powder Apply 1 application topically as needed (moisture).     No current facility-administered medications for this visit.     Past Surgical History:  Procedure Laterality Date   ANTERIOR CERVICAL DECOMP/DISCECTOMY FUSION N/A 07/23/2016   Procedure: CERVICAL THREE-FOUR, CERVICAL FOUR-FIVE, CERVICAL FIVE-SIX ANTERIOR CERVICAL DECOMPRESSION/DISCECTOMY FUSION;  Surgeon: Earnie Larsson, MD;  Location: [place];  Service: Neurosurgery;  Laterality: N/A;   APPENDECTOMY     BACK SURGERY     x 4   BREAST REDUCTION SURGERY     CARPEL TUNNEL Bilateral    CATARACT EXTRACTION, BILATERAL  spring 2018   Dr. Lennox Laity   CHOLECYSTECTOMY     COLONOSCOPY     ESOPHAGOGASTRODUODENOSCOPY     LUMBAR LAMINECTOMY/DECOMPRESSION MICRODISCECTOMY Left 12/03/2012   Procedure: Left Lumbar Four to Five Diskectomy;  Surgeon: Floyce Stakes, MD;  Location: [facility];  Service: Neurosurgery;  Laterality: Left;  LUMBAR LAMINECTOMY/DECOMPRESSION MICRODISCECTOMY 1  LEVEL   RIGHT/LEFT HEART CATH AND CORONARY ANGIOGRAPHY N/A 05/12/2018   Procedure: RIGHT/LEFT HEART CATH AND CORONARY ANGIOGRAPHY;  Surgeon: Troy Sine, MD;  Location: [place] [facility];  Service: Cardiovascular;  Laterality: N/A;   TOTAL KNEE ARTHROPLASTY Right    TOTAL KNEE ARTHROPLASTY Left 02/27/2017   Procedure: LEFT TOTAL KNEE ARTHROPLASTY;  Surgeon: Earlie Server, MD;  Location: [place];  Service: Orthopedics;  Laterality: Left;     Allergies  Allergen Reactions   Coconut Flavor Anaphylaxis    Anything with coconut in it   Cephalosporins Hives   Morphine And Related     dizziness   Sulfa Antibiotics Hives and Rash   Sulfasalazine Hives and Rash   Ketorolac     Headaches and dizziness   Cefuroxime Axetil Itching and Rash   Estrogens Rash      Family History  Problem Relation Age of Onset   Heart disease Mother    Stroke Mother    Emphysema Father    Pneumonia Father    Stroke Maternal Grandmother      Social History Ms. Ector reports that she quit smoking about 16 years ago. Her smoking use included cigarettes. She has a 80.00 pack-year smoking history. She has never used smokeless tobacco. Ms. Stanwood reports no history of alcohol use.   Review of Systems CONSTITUTIONAL: No weight loss, fever, chills, weakness or fatigue.  HEENT: Eyes: No visual loss, blurred vision, double vision or yellow sclerae.No hearing loss, sneezing, congestion, runny nose or sore throat.  SKIN: No rash or itching.  CARDIOVASCULAR:  RESPIRATORY: No shortness of breath, cough or sputum.  GASTROINTESTINAL: No anorexia, nausea, vomiting or diarrhea. No abdominal pain or blood.  GENITOURINARY: No burning on urination, no polyuria NEUROLOGICAL: No headache, dizziness, syncope, paralysis, ataxia, numbness or tingling in the extremities. No change in bowel or bladder control.  MUSCULOSKELETAL: No muscle, back pain, joint pain or stiffness.  LYMPHATICS: No enlarged nodes. No history of  splenectomy.  PSYCHIATRIC: No history of depression or anxiety.  ENDOCRINOLOGIC: No reports of sweating, cold or heat intolerance. No polyuria or polydipsia.  Marland Kitchen   Physical Examination There were no vitals filed for this visit. There were no vitals filed for this visit.  Gen: resting comfortably, no acute distress HEENT: no scleral icterus, pupils equal round and reactive, no palptable cervical adenopathy,  CV Resp: Clear to auscultation bilaterally GI: abdomen is soft, non-tender, non-distended, normal bowel sounds, no hepatosplenomegaly MSK: extremities are warm, no edema.  Skin: warm, no rash Neuro:  no focal deficits Psych: appropriate affect   Diagnostic Studies  02/2016 echo LVEF 55-60%, no WMAS, grade I diastolic dysfunction.    03/2018 echo Study Conclusions   - Left ventricle: The cavity size was normal. Wall thickness was    normal. Systolic function was normal. The estimated ejection    fraction was in the range of 55% to 60%. Wall motion was normal;    there were no regional wall motion abnormalities. Indeterminate    diastolic function.  - Aortic valve: Mildly calcified annulus. Trileaflet.  - Mitral valve: Mildly calcified annulus. There was trivial    regurgitation.  - Right atrium: Central venous pressure (est): 3 mm Hg.  - Tricuspid valve: There was trivial regurgitation.  - Pulmonary arteries: Systolic pressure could not be accurately    estimated.  - Pericardium, extracardiac: A prominent pericardial fat pad was    present.    Jan 2020 RHC/LHC Mild right heart pressure elevation with mild pulmonary hypertension.   Normal LV function without focal segmental wall motion abnormalities; LVEDP 21 mmHg   Normal coronary arteries.   05/2020 echo 1. Left ventricular ejection fraction, by estimation, is 65 to 70%. The  left ventricle has normal function. The left ventricle has no regional  wall motion abnormalities. Left ventricular diastolic parameters  were  normal.   2. Right ventricular systolic function is normal. The right ventricular  size is normal.   3. Left atrial size was mildly dilated.   4. The mitral valve is normal in structure. No evidence of mitral valve  regurgitation. No evidence of mitral stenosis.   5. The aortic valve was not well visualized. There is mild calcification  of the aortic valve. There is mild thickening of the aortic valve. Aortic  valve regurgitation is not visualized. No aortic stenosis is present.   Assessment and Plan    1. DOE/Chest pain - extensive cardiac workup as reproted above, overall benign findings other than mildly elevaetd filllig pressures, she is on lasix - no further cardiac workup at this time,   2. HTN - bp is reasonable, cotinue current meds   3. Hyperlipidemia - request pcp labs - continue statin      Arnoldo Lenis, M.D., F.A.C.C.

## 2021-02-11 ENCOUNTER — Ambulatory Visit: Payer: Managed Care, Other (non HMO) | Admitting: Cardiology

## 2021-02-11 DIAGNOSIS — J439 Emphysema, unspecified: Secondary | ICD-10-CM | POA: Diagnosis not present

## 2021-02-11 DIAGNOSIS — M7062 Trochanteric bursitis, left hip: Secondary | ICD-10-CM | POA: Diagnosis not present

## 2021-02-11 DIAGNOSIS — J479 Bronchiectasis, uncomplicated: Secondary | ICD-10-CM | POA: Diagnosis not present

## 2021-02-11 DIAGNOSIS — J209 Acute bronchitis, unspecified: Secondary | ICD-10-CM | POA: Diagnosis not present

## 2021-02-14 DIAGNOSIS — D519 Vitamin B12 deficiency anemia, unspecified: Secondary | ICD-10-CM | POA: Diagnosis not present

## 2021-02-14 DIAGNOSIS — R7989 Other specified abnormal findings of blood chemistry: Secondary | ICD-10-CM | POA: Diagnosis not present

## 2021-02-14 DIAGNOSIS — R7303 Prediabetes: Secondary | ICD-10-CM | POA: Diagnosis not present

## 2021-02-14 DIAGNOSIS — I10 Essential (primary) hypertension: Secondary | ICD-10-CM | POA: Diagnosis not present

## 2021-02-14 DIAGNOSIS — R739 Hyperglycemia, unspecified: Secondary | ICD-10-CM | POA: Diagnosis not present

## 2021-02-14 DIAGNOSIS — E1165 Type 2 diabetes mellitus with hyperglycemia: Secondary | ICD-10-CM | POA: Diagnosis not present

## 2021-02-14 DIAGNOSIS — E559 Vitamin D deficiency, unspecified: Secondary | ICD-10-CM | POA: Diagnosis not present

## 2021-02-19 DIAGNOSIS — J439 Emphysema, unspecified: Secondary | ICD-10-CM | POA: Diagnosis not present

## 2021-02-19 DIAGNOSIS — J479 Bronchiectasis, uncomplicated: Secondary | ICD-10-CM | POA: Diagnosis not present

## 2021-02-19 DIAGNOSIS — M7062 Trochanteric bursitis, left hip: Secondary | ICD-10-CM | POA: Diagnosis not present

## 2021-02-19 DIAGNOSIS — K760 Fatty (change of) liver, not elsewhere classified: Secondary | ICD-10-CM | POA: Diagnosis not present

## 2021-02-28 ENCOUNTER — Ambulatory Visit: Payer: Managed Care, Other (non HMO) | Admitting: Cardiology

## 2021-02-28 NOTE — Progress Notes (Deleted)
Clinical Summary Lynn Dixon is a 70 y.o.female seen today for follow up of the following medical problems.    1. DOE/Chest pain - symptoms started with diaphoretic episodes with activity in the summer. Some tightness in chest, left sided. +SOB. 4/10 in severity. Would stop and rest with some improvement. Progressed over the last few months - finally discussed with pcp a few weeks ago.  - 02/2018 CT PE negaitve per pcp notes - 03/29/2018 [facility]  nuclear stress test ordered by pcp: fixed distal anterior/apical defect. Mild basal anterior ischemia. LVEF 48% per nuclear     - worst over the last few weeks. With activity, example walking at grocery store holding onto buggy due to SOB, or vacuuming. Still with chest tightness, can be up 6-7/10 in severity. Can occur 2-3 times per days     - remote cath told small blockage nearly 10 years ago.    CAD risk factors: HTN, HL, former smoker x 40 years, mother with MI late 90s.       03/2018 echo LVEF 55-60%, no WMAs - Jan 2020 cath normal coronaries. Mean PA 24, PCWP 15, LVEDP 22   - some increased SOB. Occasional LE edema. Takes lasix prn to control her swelling.  - sedentary lifestyle due to chronic knee pains.   05/2020 BNP 58 05/2020 echo LVEF 65-70%, no WMAs, normal diastolic function, normal RV,     2. COPD - followed by pcp - at 05/2020 visit qualified for home O2, discussed pulm rehab   3. HTN - compliant with meds - at goal by 05/2019 clinic visit     4. HL - compliant with meds.  - labs followed by pcp    5. OSA - followed by pullmonary   Past Medical History:  Diagnosis Date   Abnormal stress test    Allergy    Arthritis    BACK   Chronic neck pain    Constipation    takes Sennokot daily   COPD (chronic obstructive pulmonary disease) (HCC)    Depression    PTSD-takes Zoloft daily   Diabetes mellitus without complication (HCC)    diet controled   GERD (gastroesophageal reflux disease)    Glaucoma     mild in left eye   H/O hiatal hernia    History of blood transfusion    History of bronchitis    History of cardiac cath    a. normal cors by cath in 04/2018 following an abnormal NST.   History of colon polyps    benign   History of echocardiogram    Echocardiogram 2/22: EF 65-70, no RWMA, normal diastolic function, normal RVSF, mild LAE, inadequate TR jet (indeterminate PASP)   Hyperlipidemia    Hypertension    Morbid obesity (HCC)    Sleep apnea    USES CPAP   Weakness    numbness and tingling. Takes Gabapentin daily     Allergies  Allergen Reactions   Coconut Flavor Anaphylaxis    Anything with coconut in it   Cephalosporins Hives   Morphine And Related     dizziness   Sulfa Antibiotics Hives and Rash   Sulfasalazine Hives and Rash   Ketorolac     Headaches and dizziness   Cefuroxime Axetil Itching and Rash   Estrogens Rash     Current Outpatient Medications  Medication Sig Dispense Refill   albuterol (VENTOLIN HFA) 108 (90 Base) MCG/ACT inhaler Inhale 2 puffs into the lungs every 6 (  six) hours as needed for wheezing or shortness of breath. 18 g 11   ARIPiprazole (ABILIFY) 2 MG tablet      aspirin EC 81 MG tablet Take 81 mg by mouth daily.     calcium-vitamin D (OSCAL WITH D) 500-200 MG-UNIT tablet Take 1 tablet by mouth daily with breakfast.     cetirizine (ZYRTEC) 10 MG tablet Take 10 mg by mouth daily.     eszopiclone (LUNESTA) 2 MG TABS tablet Take 2 mg by mouth at bedtime as needed for sleep. Take immediately before bedtime     Fluticasone-Umeclidin-Vilant (TRELEGY ELLIPTA) 100-62.5-25 MCG/INH AEPB Inhale 1 puff into the lungs daily. 1 each 11   furosemide (LASIX) 40 MG tablet Take 1 tablet (40 mg total) by mouth daily. 90 tablet 3   gabapentin (NEURONTIN) 300 MG capsule Take 900 mg by mouth 3 (three) times daily.      GLUCOSAMINE-CHONDROITIN PO Take 1 tablet by mouth at bedtime.      HYDROcodone-acetaminophen (NORCO/VICODIN) 5-325 MG tablet Take 1 tablet  by mouth 3 (three) times daily as needed for moderate pain.     Ipratropium-Albuterol (COMBIVENT IN) Inhale into the lungs.     losartan (COZAAR) 50 MG tablet Take 1 tablet (50 mg total) by mouth daily. 30 tablet 11   miconazole (MICOTIN) 2 % cream Apply 1 application topically 2 (two) times daily as needed (fungus).     montelukast (SINGULAIR) 10 MG tablet Take 10 mg by mouth daily.      Multiple Vitamin (MULTIVITAMIN WITH MINERALS) TABS tablet Take 1 tablet by mouth daily.     NARCAN 4 MG/0.1ML LIQD nasal spray kit SMARTSIG:Spray(s) Both Nares     nitroGLYCERIN (NITROSTAT) 0.4 MG SL tablet Place 1 tablet (0.4 mg total) under the tongue every 5 (five) minutes as needed. 25 tablet 3   Omega-3 Fatty Acids (FISH OIL PO) Take 1,500 mg by mouth daily.     omeprazole (PRILOSEC) 20 MG capsule Take 40 mg by mouth daily before breakfast.      sertraline (ZOLOFT) 100 MG tablet Take 100 mg by mouth 2 (two) times daily.      simvastatin (ZOCOR) 80 MG tablet Take 40 mg by mouth every evening.     talc powder Apply 1 application topically as needed (moisture).     No current facility-administered medications for this visit.     Past Surgical History:  Procedure Laterality Date   ANTERIOR CERVICAL DECOMP/DISCECTOMY FUSION N/A 07/23/2016   Procedure: CERVICAL THREE-FOUR, CERVICAL FOUR-FIVE, CERVICAL FIVE-SIX ANTERIOR CERVICAL DECOMPRESSION/DISCECTOMY FUSION;  Surgeon: Earnie Larsson, MD;  Location: [place];  Service: Neurosurgery;  Laterality: N/A;   APPENDECTOMY     BACK SURGERY     x 4   BREAST REDUCTION SURGERY     CARPEL TUNNEL Bilateral    CATARACT EXTRACTION, BILATERAL  spring 2018   Dr. Lennox Laity   CHOLECYSTECTOMY     COLONOSCOPY     ESOPHAGOGASTRODUODENOSCOPY     LUMBAR LAMINECTOMY/DECOMPRESSION MICRODISCECTOMY Left 12/03/2012   Procedure: Left Lumbar Four to Five Diskectomy;  Surgeon: Floyce Stakes, MD;  Location: [facility];  Service: Neurosurgery;  Laterality: Left;  LUMBAR  LAMINECTOMY/DECOMPRESSION MICRODISCECTOMY 1 LEVEL   RIGHT/LEFT HEART CATH AND CORONARY ANGIOGRAPHY N/A 05/12/2018   Procedure: RIGHT/LEFT HEART CATH AND CORONARY ANGIOGRAPHY;  Surgeon: Troy Sine, MD;  Location: [place] [facility];  Service: Cardiovascular;  Laterality: N/A;   TOTAL KNEE ARTHROPLASTY Right    TOTAL KNEE ARTHROPLASTY Left 02/27/2017  Procedure: LEFT TOTAL KNEE ARTHROPLASTY;  Surgeon: Earlie Server, MD;  Location: [place];  Service: Orthopedics;  Laterality: Left;     Allergies  Allergen Reactions   Coconut Flavor Anaphylaxis    Anything with coconut in it   Cephalosporins Hives   Morphine And Related     dizziness   Sulfa Antibiotics Hives and Rash   Sulfasalazine Hives and Rash   Ketorolac     Headaches and dizziness   Cefuroxime Axetil Itching and Rash   Estrogens Rash      Family History  Problem Relation Age of Onset   Heart disease Mother    Stroke Mother    Emphysema Father    Pneumonia Father    Stroke Maternal Grandmother      Social History Lynn Dixon reports that she quit smoking about 16 years ago. Her smoking use included cigarettes. She has a 80.00 pack-year smoking history. She has never used smokeless tobacco. Lynn Dixon reports no history of alcohol use.   Review of Systems CONSTITUTIONAL: No weight loss, fever, chills, weakness or fatigue.  HEENT: Eyes: No visual loss, blurred vision, double vision or yellow sclerae.No hearing loss, sneezing, congestion, runny nose or sore throat.  SKIN: No rash or itching.  CARDIOVASCULAR:  RESPIRATORY: No shortness of breath, cough or sputum.  GASTROINTESTINAL: No anorexia, nausea, vomiting or diarrhea. No abdominal pain or blood.  GENITOURINARY: No burning on urination, no polyuria NEUROLOGICAL: No headache, dizziness, syncope, paralysis, ataxia, numbness or tingling in the extremities. No change in bowel or bladder control.  MUSCULOSKELETAL: No muscle, back pain, joint pain or stiffness.   LYMPHATICS: No enlarged nodes. No history of splenectomy.  PSYCHIATRIC: No history of depression or anxiety.  ENDOCRINOLOGIC: No reports of sweating, cold or heat intolerance. No polyuria or polydipsia.  Marland Kitchen   Physical Examination There were no vitals filed for this visit. There were no vitals filed for this visit.  Gen: resting comfortably, no acute distress HEENT: no scleral icterus, pupils equal round and reactive, no palptable cervical adenopathy,  CV Resp: Clear to auscultation bilaterally GI: abdomen is soft, non-tender, non-distended, normal bowel sounds, no hepatosplenomegaly MSK: extremities are warm, no edema.  Skin: warm, no rash Neuro:  no focal deficits Psych: appropriate affect   Diagnostic Studies  02/2016 echo LVEF 55-60%, no WMAS, grade I diastolic dysfunction.    03/2018 echo Study Conclusions   - Left ventricle: The cavity size was normal. Wall thickness was    normal. Systolic function was normal. The estimated ejection    fraction was in the range of 55% to 60%. Wall motion was normal;    there were no regional wall motion abnormalities. Indeterminate    diastolic function.  - Aortic valve: Mildly calcified annulus. Trileaflet.  - Mitral valve: Mildly calcified annulus. There was trivial    regurgitation.  - Right atrium: Central venous pressure (est): 3 mm Hg.  - Tricuspid valve: There was trivial regurgitation.  - Pulmonary arteries: Systolic pressure could not be accurately    estimated.  - Pericardium, extracardiac: A prominent pericardial fat pad was    present.    Jan 2020 RHC/LHC Mild right heart pressure elevation with mild pulmonary hypertension.   Normal LV function without focal segmental wall motion abnormalities; LVEDP 21 mmHg   Normal coronary arteries.   Assessment and Plan  1. DOE/Chest pain - extensive cardiac workup as reproted above, overall benign findings other than mildly elevaetd filllig pressures, she is on lasix - no  further cardiac workup at this time,    2. HTN - bp is reasonable, cotinue current meds   3. Hyperlipidemia - request pcp labs - continue statin      Arnoldo Lenis, M.D., F.A.C.C.

## 2021-04-25 ENCOUNTER — Ambulatory Visit: Payer: Self-pay | Admitting: Cardiology

## 2021-04-25 NOTE — Progress Notes (Deleted)
Clinical Summary Lynn Dixon is a 71 y.o.female   seen today for follow up of the following medical problems.    1. DOE/Chest pain - symptoms started with diaphoretic episodes with activity in the summer. Some tightness in chest, left sided. +SOB. 4/10 in severity. Would stop and rest with some improvement. Progressed over the last few months - finally discussed with pcp a few weeks ago.  - 02/2018 CT PE negaitve per pcp notes - 03/29/2018 [facility]  nuclear stress test ordered by pcp: fixed distal anterior/apical defect. Mild basal anterior ischemia. LVEF 48% per nuclear     - worst over the last few weeks. With activity, example walking at grocery store holding onto buggy due to SOB, or vacuuming. Still with chest tightness, can be up 6-7/10 in severity. Can occur 2-3 times per days     - remote cath told small blockage nearly 10 years ago.    CAD risk factors: HTN, HL, former smoker x 40 years, mother with MI late 41s.       03/2018 echo LVEF 55-60%, no WMAs - Jan 2020 cath normal coronaries. Mean PA 24, PCWP 15, LVEDP 22   - some increased SOB. Occasional LE edema. Takes lasix prn to control her swelling.  - sedentary lifestyle due to chronic knee pains.  07/2019: normal PFTs 05/2020 echo: LVEF 04-88%, normal diastolic function, normal RV function 05/2020 BNP 58   2. COPD - followed by pcp   3. HTN - compliant with meds - at goal by 05/2019 clinic visit     4. HL - compliant with meds.  - labs followed by pcp  5.OSA -followed by Dr Shearon Stalls   Complete covid vaccine x 2.  Past Medical History:  Diagnosis Date   Abnormal stress test    Allergy    Arthritis    BACK   Chronic neck pain    Constipation    takes Sennokot daily   COPD (chronic obstructive pulmonary disease) (HCC)    Depression    PTSD-takes Zoloft daily   Diabetes mellitus without complication (HCC)    diet controled   GERD (gastroesophageal reflux disease)    Glaucoma    mild in left eye    H/O hiatal hernia    History of blood transfusion    History of bronchitis    History of cardiac cath    a. normal cors by cath in 04/2018 following an abnormal NST.   History of colon polyps    benign   History of echocardiogram    Echocardiogram 2/22: EF 65-70, no RWMA, normal diastolic function, normal RVSF, mild LAE, inadequate TR jet (indeterminate PASP)   Hyperlipidemia    Hypertension    Morbid obesity (HCC)    Sleep apnea    USES CPAP   Weakness    numbness and tingling. Takes Gabapentin daily     Allergies  Allergen Reactions   Coconut Flavor Anaphylaxis    Anything with coconut in it   Cephalosporins Hives   Morphine And Related     dizziness   Sulfa Antibiotics Hives and Rash   Sulfasalazine Hives and Rash   Ketorolac     Headaches and dizziness   Cefuroxime Axetil Itching and Rash   Estrogens Rash     Current Outpatient Medications  Medication Sig Dispense Refill   albuterol (VENTOLIN HFA) 108 (90 Base) MCG/ACT inhaler Inhale 2 puffs into the lungs every 6 (six) hours as needed for wheezing or  shortness of breath. 18 g 11   ARIPiprazole (ABILIFY) 2 MG tablet      aspirin EC 81 MG tablet Take 81 mg by mouth daily.     calcium-vitamin D (OSCAL WITH D) 500-200 MG-UNIT tablet Take 1 tablet by mouth daily with breakfast.     cetirizine (ZYRTEC) 10 MG tablet Take 10 mg by mouth daily.     eszopiclone (LUNESTA) 2 MG TABS tablet Take 2 mg by mouth at bedtime as needed for sleep. Take immediately before bedtime     Fluticasone-Umeclidin-Vilant (TRELEGY ELLIPTA) 100-62.5-25 MCG/INH AEPB Inhale 1 puff into the lungs daily. 1 each 11   furosemide (LASIX) 40 MG tablet Take 1 tablet (40 mg total) by mouth daily. 90 tablet 3   gabapentin (NEURONTIN) 300 MG capsule Take 900 mg by mouth 3 (three) times daily.      GLUCOSAMINE-CHONDROITIN PO Take 1 tablet by mouth at bedtime.      HYDROcodone-acetaminophen (NORCO/VICODIN) 5-325 MG tablet Take 1 tablet by mouth 3 (three)  times daily as needed for moderate pain.     Ipratropium-Albuterol (COMBIVENT IN) Inhale into the lungs.     losartan (COZAAR) 50 MG tablet Take 1 tablet (50 mg total) by mouth daily. 30 tablet 11   miconazole (MICOTIN) 2 % cream Apply 1 application topically 2 (two) times daily as needed (fungus).     montelukast (SINGULAIR) 10 MG tablet Take 10 mg by mouth daily.      Multiple Vitamin (MULTIVITAMIN WITH MINERALS) TABS tablet Take 1 tablet by mouth daily.     NARCAN 4 MG/0.1ML LIQD nasal spray kit SMARTSIG:Spray(s) Both Nares     nitroGLYCERIN (NITROSTAT) 0.4 MG SL tablet Place 1 tablet (0.4 mg total) under the tongue every 5 (five) minutes as needed. 25 tablet 3   Omega-3 Fatty Acids (FISH OIL PO) Take 1,500 mg by mouth daily.     omeprazole (PRILOSEC) 20 MG capsule Take 40 mg by mouth daily before breakfast.      sertraline (ZOLOFT) 100 MG tablet Take 100 mg by mouth 2 (two) times daily.      simvastatin (ZOCOR) 80 MG tablet Take 40 mg by mouth every evening.     talc powder Apply 1 application topically as needed (moisture).     No current facility-administered medications for this visit.     Past Surgical History:  Procedure Laterality Date   ANTERIOR CERVICAL DECOMP/DISCECTOMY FUSION N/A 07/23/2016   Procedure: CERVICAL THREE-FOUR, CERVICAL FOUR-FIVE, CERVICAL FIVE-SIX ANTERIOR CERVICAL DECOMPRESSION/DISCECTOMY FUSION;  Surgeon: Earnie Larsson, MD;  Location: [place];  Service: Neurosurgery;  Laterality: N/A;   APPENDECTOMY     BACK SURGERY     x 4   BREAST REDUCTION SURGERY     CARPEL TUNNEL Bilateral    CATARACT EXTRACTION, BILATERAL  spring 2018   Dr. Lennox Laity   CHOLECYSTECTOMY     COLONOSCOPY     ESOPHAGOGASTRODUODENOSCOPY     LUMBAR LAMINECTOMY/DECOMPRESSION MICRODISCECTOMY Left 12/03/2012   Procedure: Left Lumbar Four to Five Diskectomy;  Surgeon: Floyce Stakes, MD;  Location: [facility];  Service: Neurosurgery;  Laterality: Left;  LUMBAR LAMINECTOMY/DECOMPRESSION  MICRODISCECTOMY 1 LEVEL   RIGHT/LEFT HEART CATH AND CORONARY ANGIOGRAPHY N/A 05/12/2018   Procedure: RIGHT/LEFT HEART CATH AND CORONARY ANGIOGRAPHY;  Surgeon: Troy Sine, MD;  Location: [place] [facility];  Service: Cardiovascular;  Laterality: N/A;   TOTAL KNEE ARTHROPLASTY Right    TOTAL KNEE ARTHROPLASTY Left 02/27/2017   Procedure: LEFT TOTAL KNEE ARTHROPLASTY;  Surgeon: Earlie Server, MD;  Location: [place];  Service: Orthopedics;  Laterality: Left;     Allergies  Allergen Reactions   Coconut Flavor Anaphylaxis    Anything with coconut in it   Cephalosporins Hives   Morphine And Related     dizziness   Sulfa Antibiotics Hives and Rash   Sulfasalazine Hives and Rash   Ketorolac     Headaches and dizziness   Cefuroxime Axetil Itching and Rash   Estrogens Rash      Family History  Problem Relation Age of Onset   Heart disease Mother    Stroke Mother    Emphysema Father    Pneumonia Father    Stroke Maternal Grandmother      Social History Ms. Razon reports that she quit smoking about 16 years ago. Her smoking use included cigarettes. She has a 80.00 pack-year smoking history. She has never used smokeless tobacco. Ms. Whitby reports no history of alcohol use.   Review of Systems CONSTITUTIONAL: No weight loss, fever, chills, weakness or fatigue.  HEENT: Eyes: No visual loss, blurred vision, double vision or yellow sclerae.No hearing loss, sneezing, congestion, runny nose or sore throat.  SKIN: No rash or itching.  CARDIOVASCULAR:  RESPIRATORY: No shortness of breath, cough or sputum.  GASTROINTESTINAL: No anorexia, nausea, vomiting or diarrhea. No abdominal pain or blood.  GENITOURINARY: No burning on urination, no polyuria NEUROLOGICAL: No headache, dizziness, syncope, paralysis, ataxia, numbness or tingling in the extremities. No change in bowel or bladder control.  MUSCULOSKELETAL: No muscle, back pain, joint pain or stiffness.  LYMPHATICS: No enlarged nodes.  No history of splenectomy.  PSYCHIATRIC: No history of depression or anxiety.  ENDOCRINOLOGIC: No reports of sweating, cold or heat intolerance. No polyuria or polydipsia.  Marland Kitchen   Physical Examination There were no vitals filed for this visit. There were no vitals filed for this visit.  Gen: resting comfortably, no acute distress HEENT: no scleral icterus, pupils equal round and reactive, no palptable cervical adenopathy,  CV Resp: Clear to auscultation bilaterally GI: abdomen is soft, non-tender, non-distended, normal bowel sounds, no hepatosplenomegaly MSK: extremities are warm, no edema.  Skin: warm, no rash Neuro:  no focal deficits Psych: appropriate affect   Diagnostic Studies  02/2016 echo LVEF 55-60%, no WMAS, grade I diastolic dysfunction.    03/2018 echo Study Conclusions   - Left ventricle: The cavity size was normal. Wall thickness was    normal. Systolic function was normal. The estimated ejection    fraction was in the range of 55% to 60%. Wall motion was normal;    there were no regional wall motion abnormalities. Indeterminate    diastolic function.  - Aortic valve: Mildly calcified annulus. Trileaflet.  - Mitral valve: Mildly calcified annulus. There was trivial    regurgitation.  - Right atrium: Central venous pressure (est): 3 mm Hg.  - Tricuspid valve: There was trivial regurgitation.  - Pulmonary arteries: Systolic pressure could not be accurately    estimated.  - Pericardium, extracardiac: A prominent pericardial fat pad was    present.    Jan 2020 RHC/LHC Mild right heart pressure elevation with mild pulmonary hypertension.   Normal LV function without focal segmental wall motion abnormalities; LVEDP 21 mmHg   Normal coronary arteries.     Assessment and Plan  1. DOE/Chest pain - extensive cardiac workup as reproted above, overall benign findings other than mildly elevaetd filllig pressures, she is on lasix - no further cardiac workup at  this time,    2. HTN - bp is reasonable, cotinue current meds   3. Hyperlipidemia - request pcp labs - continue statin      Arnoldo Lenis, M.D., F.A.C.C.

## 2021-05-16 DIAGNOSIS — D519 Vitamin B12 deficiency anemia, unspecified: Secondary | ICD-10-CM | POA: Diagnosis not present

## 2021-05-16 DIAGNOSIS — E7800 Pure hypercholesterolemia, unspecified: Secondary | ICD-10-CM | POA: Diagnosis not present

## 2021-05-16 DIAGNOSIS — R7989 Other specified abnormal findings of blood chemistry: Secondary | ICD-10-CM | POA: Diagnosis not present

## 2021-05-16 DIAGNOSIS — E1165 Type 2 diabetes mellitus with hyperglycemia: Secondary | ICD-10-CM | POA: Diagnosis not present

## 2021-05-16 DIAGNOSIS — D649 Anemia, unspecified: Secondary | ICD-10-CM | POA: Diagnosis not present

## 2021-05-16 DIAGNOSIS — E870 Hyperosmolality and hypernatremia: Secondary | ICD-10-CM | POA: Diagnosis not present

## 2021-05-16 DIAGNOSIS — D529 Folate deficiency anemia, unspecified: Secondary | ICD-10-CM | POA: Diagnosis not present

## 2021-05-17 ENCOUNTER — Other Ambulatory Visit: Payer: Self-pay

## 2021-05-17 MED ORDER — LOSARTAN POTASSIUM 50 MG PO TABS: 50.0000 mg | ORAL_TABLET | Freq: Every day | ORAL | 0 refills | Status: DC

## 2021-05-17 NOTE — Telephone Encounter (Signed)
Pt no showed cardiology apt 04/25/21, cancelled 02/28/21, and 02/11/21,  15 day refill only of losartan 50 mg given, needs apt

## 2021-05-21 ENCOUNTER — Telehealth: Payer: Self-pay | Admitting: Cardiology

## 2021-05-21 DIAGNOSIS — J439 Emphysema, unspecified: Secondary | ICD-10-CM | POA: Diagnosis not present

## 2021-05-21 DIAGNOSIS — I700 Atherosclerosis of aorta: Secondary | ICD-10-CM | POA: Diagnosis not present

## 2021-05-21 DIAGNOSIS — R7989 Other specified abnormal findings of blood chemistry: Secondary | ICD-10-CM | POA: Diagnosis not present

## 2021-05-21 DIAGNOSIS — R7303 Prediabetes: Secondary | ICD-10-CM | POA: Diagnosis not present

## 2021-05-21 DIAGNOSIS — J479 Bronchiectasis, uncomplicated: Secondary | ICD-10-CM | POA: Diagnosis not present

## 2021-05-21 DIAGNOSIS — K760 Fatty (change of) liver, not elsewhere classified: Secondary | ICD-10-CM | POA: Diagnosis not present

## 2021-05-21 DIAGNOSIS — I10 Essential (primary) hypertension: Secondary | ICD-10-CM | POA: Diagnosis not present

## 2021-05-21 MED ORDER — LOSARTAN POTASSIUM 50 MG PO TABS: 50.0000 mg | ORAL_TABLET | Freq: Every day | ORAL | 0 refills | Status: DC

## 2021-05-21 NOTE — Telephone Encounter (Signed)
Done

## 2021-05-21 NOTE — Telephone Encounter (Signed)
° °*  STAT* If patient is at the pharmacy, call can be transferred to refill team.   1. Which medications need to be refilled? (please list name of each medication and dose if known) losartan (COZAAR) 50 MG tablet  2. Which pharmacy/location (including street and city if local pharmacy) is medication to be sent to? [facility] - [place], [place] - [address]  3. Do they need a 30 day or 90 day supply? 90 days  Pt made an appt with Dr. Wyline Mood on 08/07/21

## 2021-06-13 DIAGNOSIS — J441 Chronic obstructive pulmonary disease with (acute) exacerbation: Secondary | ICD-10-CM | POA: Diagnosis not present

## 2021-07-06 DIAGNOSIS — I10 Essential (primary) hypertension: Secondary | ICD-10-CM | POA: Diagnosis not present

## 2021-07-06 DIAGNOSIS — R059 Cough, unspecified: Secondary | ICD-10-CM | POA: Diagnosis not present

## 2021-07-06 DIAGNOSIS — J441 Chronic obstructive pulmonary disease with (acute) exacerbation: Secondary | ICD-10-CM | POA: Diagnosis not present

## 2021-08-07 ENCOUNTER — Ambulatory Visit: Payer: Medicare Other | Admitting: Cardiology

## 2021-08-07 NOTE — Progress Notes (Deleted)
Clinical Summary Ms. Steedman is a 71 y.o.female  seen today for follow up of the following medical problems.    1. DOE/Chest pain - symptoms started with diaphoretic episodes with activity in the summer. Some tightness in chest, left sided. +SOB. 4/10 in severity. Would stop and rest with some improvement. Progressed over the last few months - finally discussed with pcp a few weeks ago.  - 02/2018 CT PE negaitve per pcp notes - 03/29/2018 [facility]  nuclear stress test ordered by pcp: fixed distal anterior/apical defect. Mild basal anterior ischemia. LVEF 48% per nuclear     - worst over the last few weeks. With activity, example walking at grocery store holding onto buggy due to SOB, or vacuuming. Still with chest tightness, can be up 6-7/10 in severity. Can occur 2-3 times per days     - remote cath told small blockage nearly 10 years ago.    CAD risk factors: HTN, HL, former smoker x 40 years, mother with MI late 64s.       03/2018 echo LVEF 55-60%, no WMAs - Jan 2020 cath normal coronaries. Mean PA 24, PCWP 15, LVEDP 22   - some increased SOB. Occasional LE edema. Takes lasix prn to control her swelling.  - sedentary lifestyle due to chronic knee pains.   05/2020 echo: LVEF 65-70%, no WMAs, normal diastolic fxn, normal RV, inadequate TR jet to estimated PASP - 05/2020 BNP 58   2. COPD - followed by pcp   3. HTN - compliant with meds - at goal by 05/2019 clinic visit     4. HL - compliant with meds.  - labs followed by pcp   Past Medical History:  Diagnosis Date   Abnormal stress test    Allergy    Arthritis    BACK   Chronic neck pain    Constipation    takes Sennokot daily   COPD (chronic obstructive pulmonary disease) (HCC)    Depression    PTSD-takes Zoloft daily   Diabetes mellitus without complication (HCC)    diet controled   GERD (gastroesophageal reflux disease)    Glaucoma    mild in left eye   H/O hiatal hernia    History of blood  transfusion    History of bronchitis    History of cardiac cath    a. normal cors by cath in 04/2018 following an abnormal NST.   History of colon polyps    benign   History of echocardiogram    Echocardiogram 2/22: EF 65-70, no RWMA, normal diastolic function, normal RVSF, mild LAE, inadequate TR jet (indeterminate PASP)   Hyperlipidemia    Hypertension    Morbid obesity (HCC)    Sleep apnea    USES CPAP   Weakness    numbness and tingling. Takes Gabapentin daily     Allergies  Allergen Reactions   Coconut Flavor Anaphylaxis    Anything with coconut in it   Cephalosporins Hives   Morphine And Related     dizziness   Sulfa Antibiotics Hives and Rash   Sulfasalazine Hives and Rash   Ketorolac     Headaches and dizziness   Cefuroxime Axetil Itching and Rash   Estrogens Rash     Current Outpatient Medications  Medication Sig Dispense Refill   albuterol (VENTOLIN HFA) 108 (90 Base) MCG/ACT inhaler Inhale 2 puffs into the lungs every 6 (six) hours as needed for wheezing or shortness of breath. 18 g 11  ARIPiprazole (ABILIFY) 2 MG tablet      aspirin EC 81 MG tablet Take 81 mg by mouth daily.     calcium-vitamin D (OSCAL WITH D) 500-200 MG-UNIT tablet Take 1 tablet by mouth daily with breakfast.     cetirizine (ZYRTEC) 10 MG tablet Take 10 mg by mouth daily.     eszopiclone (LUNESTA) 2 MG TABS tablet Take 2 mg by mouth at bedtime as needed for sleep. Take immediately before bedtime     Fluticasone-Umeclidin-Vilant (TRELEGY ELLIPTA) 100-62.5-25 MCG/INH AEPB Inhale 1 puff into the lungs daily. 1 each 11   furosemide (LASIX) 40 MG tablet Take 1 tablet (40 mg total) by mouth daily. 90 tablet 3   gabapentin (NEURONTIN) 300 MG capsule Take 900 mg by mouth 3 (three) times daily.      GLUCOSAMINE-CHONDROITIN PO Take 1 tablet by mouth at bedtime.      HYDROcodone-acetaminophen (NORCO/VICODIN) 5-325 MG tablet Take 1 tablet by mouth 3 (three) times daily as needed for moderate pain.      Ipratropium-Albuterol (COMBIVENT IN) Inhale into the lungs.     losartan (COZAAR) 50 MG tablet Take 1 tablet (50 mg total) by mouth daily. 90 tablet 0   miconazole (MICOTIN) 2 % cream Apply 1 application topically 2 (two) times daily as needed (fungus).     montelukast (SINGULAIR) 10 MG tablet Take 10 mg by mouth daily.      Multiple Vitamin (MULTIVITAMIN WITH MINERALS) TABS tablet Take 1 tablet by mouth daily.     NARCAN 4 MG/0.1ML LIQD nasal spray kit SMARTSIG:Spray(s) Both Nares     nitroGLYCERIN (NITROSTAT) 0.4 MG SL tablet Place 1 tablet (0.4 mg total) under the tongue every 5 (five) minutes as needed. 25 tablet 3   Omega-3 Fatty Acids (FISH OIL PO) Take 1,500 mg by mouth daily.     omeprazole (PRILOSEC) 20 MG capsule Take 40 mg by mouth daily before breakfast.      sertraline (ZOLOFT) 100 MG tablet Take 100 mg by mouth 2 (two) times daily.      simvastatin (ZOCOR) 80 MG tablet Take 40 mg by mouth every evening.     talc powder Apply 1 application topically as needed (moisture).     No current facility-administered medications for this visit.     Past Surgical History:  Procedure Laterality Date   ANTERIOR CERVICAL DECOMP/DISCECTOMY FUSION N/A 07/23/2016   Procedure: CERVICAL THREE-FOUR, CERVICAL FOUR-FIVE, CERVICAL FIVE-SIX ANTERIOR CERVICAL DECOMPRESSION/DISCECTOMY FUSION;  Surgeon: Earnie Larsson, MD;  Location: [place];  Service: Neurosurgery;  Laterality: N/A;   APPENDECTOMY     BACK SURGERY     x 4   BREAST REDUCTION SURGERY     CARPEL TUNNEL Bilateral    CATARACT EXTRACTION, BILATERAL  spring 2018   Dr. Lennox Laity   CHOLECYSTECTOMY     COLONOSCOPY     ESOPHAGOGASTRODUODENOSCOPY     LUMBAR LAMINECTOMY/DECOMPRESSION MICRODISCECTOMY Left 12/03/2012   Procedure: Left Lumbar Four to Five Diskectomy;  Surgeon: Floyce Stakes, MD;  Location: [facility];  Service: Neurosurgery;  Laterality: Left;  LUMBAR LAMINECTOMY/DECOMPRESSION MICRODISCECTOMY 1 LEVEL   RIGHT/LEFT HEART CATH AND  CORONARY ANGIOGRAPHY N/A 05/12/2018   Procedure: RIGHT/LEFT HEART CATH AND CORONARY ANGIOGRAPHY;  Surgeon: Troy Sine, MD;  Location: [place] [facility];  Service: Cardiovascular;  Laterality: N/A;   TOTAL KNEE ARTHROPLASTY Right    TOTAL KNEE ARTHROPLASTY Left 02/27/2017   Procedure: LEFT TOTAL KNEE ARTHROPLASTY;  Surgeon: Earlie Server, MD;  Location: [place];  Service: Orthopedics;  Laterality: Left;     Allergies  Allergen Reactions   Coconut Flavor Anaphylaxis    Anything with coconut in it   Cephalosporins Hives   Morphine And Related     dizziness   Sulfa Antibiotics Hives and Rash   Sulfasalazine Hives and Rash   Ketorolac     Headaches and dizziness   Cefuroxime Axetil Itching and Rash   Estrogens Rash      Family History  Problem Relation Age of Onset   Heart disease Mother    Stroke Mother    Emphysema Father    Pneumonia Father    Stroke Maternal Grandmother      Social History Ms. Goates reports that she quit smoking about 17 years ago. Her smoking use included cigarettes. She has a 80.00 pack-year smoking history. She has never used smokeless tobacco. Ms. Critcher reports no history of alcohol use.   Review of Systems CONSTITUTIONAL: No weight loss, fever, chills, weakness or fatigue.  HEENT: Eyes: No visual loss, blurred vision, double vision or yellow sclerae.No hearing loss, sneezing, congestion, runny nose or sore throat.  SKIN: No rash or itching.  CARDIOVASCULAR:  RESPIRATORY: No shortness of breath, cough or sputum.  GASTROINTESTINAL: No anorexia, nausea, vomiting or diarrhea. No abdominal pain or blood.  GENITOURINARY: No burning on urination, no polyuria NEUROLOGICAL: No headache, dizziness, syncope, paralysis, ataxia, numbness or tingling in the extremities. No change in bowel or bladder control.  MUSCULOSKELETAL: No muscle, back pain, joint pain or stiffness.  LYMPHATICS: No enlarged nodes. No history of splenectomy.  PSYCHIATRIC: No history  of depression or anxiety.  ENDOCRINOLOGIC: No reports of sweating, cold or heat intolerance. No polyuria or polydipsia.  Marland Kitchen   Physical Examination There were no vitals filed for this visit. There were no vitals filed for this visit.  Gen: resting comfortably, no acute distress HEENT: no scleral icterus, pupils equal round and reactive, no palptable cervical adenopathy,  CV Resp: Clear to auscultation bilaterally GI: abdomen is soft, non-tender, non-distended, normal bowel sounds, no hepatosplenomegaly MSK: extremities are warm, no edema.  Skin: warm, no rash Neuro:  no focal deficits Psych: appropriate affect   Diagnostic Studies 02/2016 echo LVEF 55-60%, no WMAS, grade I diastolic dysfunction.    03/2018 echo Study Conclusions   - Left ventricle: The cavity size was normal. Wall thickness was    normal. Systolic function was normal. The estimated ejection    fraction was in the range of 55% to 60%. Wall motion was normal;    there were no regional wall motion abnormalities. Indeterminate    diastolic function.  - Aortic valve: Mildly calcified annulus. Trileaflet.  - Mitral valve: Mildly calcified annulus. There was trivial    regurgitation.  - Right atrium: Central venous pressure (est): 3 mm Hg.  - Tricuspid valve: There was trivial regurgitation.  - Pulmonary arteries: Systolic pressure could not be accurately    estimated.  - Pericardium, extracardiac: A prominent pericardial fat pad was    present.    Jan 2020 RHC/LHC Mild right heart pressure elevation with mild pulmonary hypertension.   Normal LV function without focal segmental wall motion abnormalities; LVEDP 21 mmHg   Normal coronary arteries.    Assessment and Plan   1. DOE/Chest pain - extensive cardiac workup as reproted above, overall benign findings other than mildly elevaetd filllig pressures, she is on lasix - no further cardiac workup at this time,    2. HTN - bp is reasonable,  cotinue  current meds   3. Hyperlipidemia - request pcp labs - continue statin     Arnoldo Lenis, M.D., F.A.C.C.

## 2021-08-09 ENCOUNTER — Encounter: Payer: Self-pay | Admitting: Cardiology

## 2021-08-19 DIAGNOSIS — I10 Essential (primary) hypertension: Secondary | ICD-10-CM | POA: Diagnosis not present

## 2021-08-19 DIAGNOSIS — E559 Vitamin D deficiency, unspecified: Secondary | ICD-10-CM | POA: Diagnosis not present

## 2021-08-19 DIAGNOSIS — Z1329 Encounter for screening for other suspected endocrine disorder: Secondary | ICD-10-CM | POA: Diagnosis not present

## 2021-08-19 DIAGNOSIS — E1165 Type 2 diabetes mellitus with hyperglycemia: Secondary | ICD-10-CM | POA: Diagnosis not present

## 2021-08-20 ENCOUNTER — Other Ambulatory Visit: Payer: Self-pay | Admitting: Cardiology

## 2021-08-22 DIAGNOSIS — Z0001 Encounter for general adult medical examination with abnormal findings: Secondary | ICD-10-CM | POA: Diagnosis not present

## 2021-08-22 DIAGNOSIS — J439 Emphysema, unspecified: Secondary | ICD-10-CM | POA: Diagnosis not present

## 2021-08-22 DIAGNOSIS — K760 Fatty (change of) liver, not elsewhere classified: Secondary | ICD-10-CM | POA: Diagnosis not present

## 2021-08-22 DIAGNOSIS — J479 Bronchiectasis, uncomplicated: Secondary | ICD-10-CM | POA: Diagnosis not present

## 2021-08-22 DIAGNOSIS — J9611 Chronic respiratory failure with hypoxia: Secondary | ICD-10-CM | POA: Diagnosis not present

## 2021-08-22 DIAGNOSIS — Z23 Encounter for immunization: Secondary | ICD-10-CM | POA: Diagnosis not present

## 2021-08-22 DIAGNOSIS — R7989 Other specified abnormal findings of blood chemistry: Secondary | ICD-10-CM | POA: Diagnosis not present

## 2021-08-22 DIAGNOSIS — I10 Essential (primary) hypertension: Secondary | ICD-10-CM | POA: Diagnosis not present

## 2021-09-26 ENCOUNTER — Other Ambulatory Visit: Payer: Self-pay | Admitting: Cardiology

## 2021-10-14 ENCOUNTER — Other Ambulatory Visit: Payer: Self-pay | Admitting: Cardiology

## 2021-10-14 ENCOUNTER — Telehealth: Payer: Self-pay | Admitting: Cardiology

## 2021-10-14 MED ORDER — LOSARTAN POTASSIUM 50 MG PO TABS: ORAL_TABLET | ORAL | 0 refills | Status: DC

## 2021-10-14 NOTE — Telephone Encounter (Signed)
Refilled for 30 days until scheduled appointment

## 2021-10-14 NOTE — Telephone Encounter (Signed)
*  STAT* If patient is at the pharmacy, call can be transferred to refill team.   1. Which medications need to be refilled? (please list name of each medication and dose if known)  losartan (COZAAR) 50 MG tablet  2. Which pharmacy/location (including street and city if local pharmacy) is medication to be sent to? [facility] - [place], [place] - [address]  3. Do they need a 30 day or 90 day supply? 90 with refills  Patient is out of medication   Patient is scheduled to see Ronie Spies 12/05/21

## 2021-11-19 ENCOUNTER — Other Ambulatory Visit: Payer: Self-pay | Admitting: Cardiology

## 2021-12-02 NOTE — Progress Notes (Deleted)
Cardiology Office Note    Date:  12/02/2021   ID:  CAMEO SCHMIESING, DOB 1950-06-22, MRN [id number]  PCP:  Juliette Alcide, MD  Cardiologist:  Dina Rich, MD  Electrophysiologist:  None   Chief Complaint: ***  History of Present Illness:   Lynn Dixon is a 71 y.o. female with history of mild pulm HTN, HTN, HLD, DM, COPD/emphysema, OSA on CPAP, morbid obesity, GERD, arthritis who is seen for follow-up. She has previously seen our team for exertional dyspnea without significant cardiac findings on workup. Cardiac cath 04/2018 showed mild right heart pressure elevation with mild pulmonary hypertension, normal coronaries. Last echo 05/2020 EF 65-70%, normal RV, no pulm HTN reported, normal diastolic parameters, mild LAE, mild calcification of AV without significant valve disease.   Last labs 2022 nothing useful Who follows osa or regular labs  Dyspnea on exertion Mild pulmonary HTN OSA Essential HTN  Labwork independently reviewed: 05/2020 BNP 58 2020 K 4.1, Hgb 9.5 04/2018 Cr 0.85, LFTs ok  Cardiology Studies:   Studies reviewed are outlined and summarized above. Reports included below if pertinent.   Echo 05/2020    1. Left ventricular ejection fraction, by estimation, is 65 to 70%. The  left ventricle has normal function. The left ventricle has no regional  wall motion abnormalities. Left ventricular diastolic parameters were  normal.   2. Right ventricular systolic function is normal. The right ventricular  size is normal.   3. Left atrial size was mildly dilated.   4. The mitral valve is normal in structure. No evidence of mitral valve  regurgitation. No evidence of mitral stenosis.   5. The aortic valve was not well visualized. There is mild calcification  of the aortic valve. There is mild thickening of the aortic valve. Aortic  valve regurgitation is not visualized. No aortic stenosis is present.  R/LHC 04/2018 Mild right heart pressure elevation with mild  pulmonary hypertension.   Normal LV function without focal segmental wall motion abnormalities; LVEDP 21 mmHg   Normal coronary arteries.   RECOMMENDATION: Medical therapy.  Suspect the patient's underlying COPD may be contributing to her exertional dyspnea.     Past Medical History:  Diagnosis Date   Abnormal stress test    Allergy    Arthritis    BACK   Chronic neck pain    Constipation    takes Sennokot daily   COPD (chronic obstructive pulmonary disease) (HCC)    Depression    PTSD-takes Zoloft daily   Diabetes mellitus without complication (HCC)    diet controled   GERD (gastroesophageal reflux disease)    Glaucoma    mild in left eye   H/O hiatal hernia    History of blood transfusion    History of bronchitis    History of cardiac cath    a. normal cors by cath in 04/2018 following an abnormal NST.   History of colon polyps    benign   History of echocardiogram    Echocardiogram 2/22: EF 65-70, no RWMA, normal diastolic function, normal RVSF, mild LAE, inadequate TR jet (indeterminate PASP)   Hyperlipidemia    Hypertension    Morbid obesity (HCC)    Sleep apnea    USES CPAP   Weakness    numbness and tingling. Takes Gabapentin daily    Past Surgical History:  Procedure Laterality Date   ANTERIOR CERVICAL DECOMP/DISCECTOMY FUSION N/A 07/23/2016   Procedure: CERVICAL THREE-FOUR, CERVICAL FOUR-FIVE, CERVICAL FIVE-SIX ANTERIOR CERVICAL DECOMPRESSION/DISCECTOMY FUSION;  Surgeon: Julio Sicks, MD;  Location: [facility];  Service: Neurosurgery;  Laterality: N/A;   APPENDECTOMY     BACK SURGERY     x 4   BREAST REDUCTION SURGERY     CARPEL TUNNEL Bilateral    CATARACT EXTRACTION, BILATERAL  spring 2018   Dr. Darrick Grinder   CHOLECYSTECTOMY     COLONOSCOPY     ESOPHAGOGASTRODUODENOSCOPY     LUMBAR LAMINECTOMY/DECOMPRESSION MICRODISCECTOMY Left 12/03/2012   Procedure: Left Lumbar Four to Five Diskectomy;  Surgeon: Karn Cassis, MD;  Location: [facility];  Service:  Neurosurgery;  Laterality: Left;  LUMBAR LAMINECTOMY/DECOMPRESSION MICRODISCECTOMY 1 LEVEL   RIGHT/LEFT HEART CATH AND CORONARY ANGIOGRAPHY N/A 05/12/2018   Procedure: RIGHT/LEFT HEART CATH AND CORONARY ANGIOGRAPHY;  Surgeon: Lennette Bihari, MD;  Location: [facility];  Service: Cardiovascular;  Laterality: N/A;   TOTAL KNEE ARTHROPLASTY Right    TOTAL KNEE ARTHROPLASTY Left 02/27/2017   Procedure: LEFT TOTAL KNEE ARTHROPLASTY;  Surgeon: Frederico Hamman, MD;  Location: [facility];  Service: Orthopedics;  Laterality: Left;    Current Medications: No outpatient medications have been marked as taking for the 12/05/21 encounter (Appointment) with Laurann Montana, PA-C.   ***   Allergies:   Coconut flavor, Cephalosporins, Morphine and related, Sulfa antibiotics, Sulfasalazine, Ketorolac, Cefuroxime axetil, and Estrogens   Social History   Socioeconomic History   Marital status: Married    Spouse name: Not on file   Number of children: Not on file   Years of education: Not on file   Highest education level: Not on file  Occupational History   Not on file  Tobacco Use   Smoking status: Former    Packs/day: 2.00    Years: 40.00    Total pack years: 80.00    Types: Cigarettes    Quit date: 05/17/2004    Years since quitting: 17.5   Smokeless tobacco: Never  Vaping Use   Vaping Use: Never used  Substance and Sexual Activity   Alcohol use: No   Drug use: No   Sexual activity: Not on file  Other Topics Concern   Not on file  Social History Narrative   Not on file   Social Determinants of Health   Financial Resource Strain: Not on file  Food Insecurity: Not on file  Transportation Needs: Not on file  Physical Activity: Not on file  Stress: Not on file  Social Connections: Not on file     Family History:  The patient's ***family history includes Emphysema in her father; Heart disease in her mother; Pneumonia in her father; Stroke in her maternal grandmother and mother.  ROS:    Please see the history of present illness. Otherwise, review of systems is positive for ***.  All other systems are reviewed and otherwise negative.    EKG(s)/Additional Labs   EKG:  EKG is ordered today, personally reviewed, demonstrating ***  Recent Labs: No results found for requested labs within last 365 days.  Recent Lipid Panel No results found for: "CHOL", "TRIG", "HDL", "CHOLHDL", "VLDL", "LDLCALC", "LDLDIRECT"  PHYSICAL EXAM:    VS:  There were no vitals taken for this visit.  BMI: There is no height or weight on file to calculate BMI.  GEN: Well nourished, well developed female in no acute distress HEENT: normocephalic, atraumatic Neck: no JVD, carotid bruits, or masses Cardiac: ***RRR; no murmurs, rubs, or gallops, no edema  Respiratory:  clear to auscultation bilaterally, normal work of breathing GI: soft, nontender, nondistended, +  BS MS: no deformity or atrophy Skin: warm and dry, no rash Neuro:  Alert and Oriented x 3, Strength and sensation are intact, follows commands Psych: euthymic mood, full affect  Wt Readings from Last 3 Encounters:  06/07/20 222 lb 3.2 oz (100.8 kg)  05/16/20 216 lb 9.6 oz (98.2 kg)  02/28/20 213 lb 12.8 oz (97 kg)     ASSESSMENT & PLAN:   ***     Disposition: F/u with ***   Medication Adjustments/Labs and Tests Ordered: Current medicines are reviewed at length with the patient today.  Concerns regarding medicines are outlined above. Medication changes, Labs and Tests ordered today are summarized above and listed in the Patient Instructions accessible in Encounters.    Signed, Laurann Montana, PA-C  12/02/2021 12:43 PM    [facility] - [place] Location in [facility] [address] [place], [place] [address] Ph: [phone]; Fax [phone]

## 2021-12-05 ENCOUNTER — Ambulatory Visit: Payer: Medicare Other | Admitting: Physician Assistant

## 2021-12-05 DIAGNOSIS — G4733 Obstructive sleep apnea (adult) (pediatric): Secondary | ICD-10-CM

## 2021-12-05 DIAGNOSIS — R0609 Other forms of dyspnea: Secondary | ICD-10-CM

## 2021-12-05 DIAGNOSIS — I272 Pulmonary hypertension, unspecified: Secondary | ICD-10-CM

## 2021-12-05 DIAGNOSIS — I10 Essential (primary) hypertension: Secondary | ICD-10-CM

## 2021-12-30 ENCOUNTER — Other Ambulatory Visit: Payer: Self-pay | Admitting: Cardiology

## 2022-01-01 ENCOUNTER — Other Ambulatory Visit (HOSPITAL_COMMUNITY): Payer: Self-pay | Admitting: Adult Health Nurse Practitioner

## 2022-01-01 DIAGNOSIS — Z13820 Encounter for screening for osteoporosis: Secondary | ICD-10-CM

## 2022-01-08 ENCOUNTER — Ambulatory Visit (HOSPITAL_COMMUNITY)
Admission: RE | Admit: 2022-01-08 | Discharge: 2022-01-08 | Disposition: A | Discharge status: Home / Self Care | Payer: No Typology Code available for payment source | Source: Ambulatory Visit | Attending: Adult Health Nurse Practitioner | Admitting: Adult Health Nurse Practitioner

## 2022-01-08 DIAGNOSIS — Z13820 Encounter for screening for osteoporosis: Secondary | ICD-10-CM | POA: Diagnosis not present

## 2022-02-24 DIAGNOSIS — I10 Essential (primary) hypertension: Secondary | ICD-10-CM | POA: Diagnosis not present

## 2022-02-24 DIAGNOSIS — E1165 Type 2 diabetes mellitus with hyperglycemia: Secondary | ICD-10-CM | POA: Diagnosis not present

## 2022-02-27 DIAGNOSIS — R030 Elevated blood-pressure reading, without diagnosis of hypertension: Secondary | ICD-10-CM | POA: Diagnosis not present

## 2022-02-27 DIAGNOSIS — J479 Bronchiectasis, uncomplicated: Secondary | ICD-10-CM | POA: Diagnosis not present

## 2022-02-27 DIAGNOSIS — Z23 Encounter for immunization: Secondary | ICD-10-CM | POA: Diagnosis not present

## 2022-02-27 DIAGNOSIS — J9611 Chronic respiratory failure with hypoxia: Secondary | ICD-10-CM | POA: Diagnosis not present

## 2022-02-27 DIAGNOSIS — J439 Emphysema, unspecified: Secondary | ICD-10-CM | POA: Diagnosis not present

## 2022-02-27 DIAGNOSIS — K760 Fatty (change of) liver, not elsewhere classified: Secondary | ICD-10-CM | POA: Diagnosis not present

## 2022-02-27 DIAGNOSIS — N3281 Overactive bladder: Secondary | ICD-10-CM | POA: Diagnosis not present

## 2022-02-27 DIAGNOSIS — R7989 Other specified abnormal findings of blood chemistry: Secondary | ICD-10-CM | POA: Diagnosis not present

## 2022-02-27 DIAGNOSIS — I77810 Thoracic aortic ectasia: Secondary | ICD-10-CM | POA: Diagnosis not present

## 2022-02-27 DIAGNOSIS — I700 Atherosclerosis of aorta: Secondary | ICD-10-CM | POA: Diagnosis not present

## 2022-02-27 DIAGNOSIS — D696 Thrombocytopenia, unspecified: Secondary | ICD-10-CM | POA: Diagnosis not present

## 2022-05-15 ENCOUNTER — Encounter: Payer: Self-pay | Admitting: *Deleted

## 2022-05-15 ENCOUNTER — Ambulatory Visit: Payer: Non-veteran care | Attending: Cardiology | Admitting: Cardiology

## 2022-05-15 ENCOUNTER — Encounter: Payer: Self-pay | Admitting: Cardiology

## 2022-05-15 VITALS — BP 130/55 | HR 59 | Ht <= 58 in | Wt 212.2 lb

## 2022-05-15 DIAGNOSIS — I10 Essential (primary) hypertension: Secondary | ICD-10-CM | POA: Diagnosis not present

## 2022-05-15 DIAGNOSIS — E782 Mixed hyperlipidemia: Secondary | ICD-10-CM

## 2022-05-15 DIAGNOSIS — R0789 Other chest pain: Secondary | ICD-10-CM

## 2022-05-15 DIAGNOSIS — R0609 Other forms of dyspnea: Secondary | ICD-10-CM

## 2022-05-15 NOTE — Progress Notes (Signed)
Clinical Summary Lynn Dixon is a 72 y.o.female seen today for follow up of the following medical problems.    1. DOE/Chest pain - symptoms started with diaphoretic episodes with activity in the summer. Some tightness in chest, left sided. +SOB. 4/10 in severity. Would stop and rest with some improvement. Progressed over the last few months - finally discussed with pcp a few weeks ago.   - 02/2018 CT PE negaitve per pcp notes - 03/29/2018 [facility]  nuclear stress test ordered by pcp: fixed distal anterior/apical defect. Mild basal anterior ischemia. LVEF 48% per nuclear     - worst over the last few weeks. With activity, example walking at grocery store holding onto buggy due to SOB, or vacuuming. Still with chest tightness, can be up 6-7/10 in severity. Can occur 2-3 times per days     - remote cath told small blockage nearly 10 years ago.    CAD risk factors: HTN, HL, former smoker x 40 years, mother with MI late 72s.       03/2018 echo LVEF 55-60%, no WMAs - Jan 2020 cath normal coronaries. Mean PA 24, PCWP 15, LVEDP 22 - 05/2020: LVEF 65-70%, normal diastolic, normal RV   - chronic stable SOB - no recent edema   2. COPD - followed by pulmonary - on 2L [place] at home as needed  3. HTN - she is compliant with meds     4. HL - compliant with meds.  - labs followed by pcp  - 05/2021 TC 149 TG 175 HDL 42 LDL 77      5. OSA - followed by pulm Past Medical History:  Diagnosis Date   Abnormal stress test    Allergy    Arthritis    BACK   Chronic neck pain    Constipation    takes Sennokot daily   COPD (chronic obstructive pulmonary disease) (HCC)    Depression    PTSD-takes Zoloft daily   Diabetes mellitus without complication (HCC)    diet controled   GERD (gastroesophageal reflux disease)    Glaucoma    mild in left eye   H/O hiatal hernia    History of blood transfusion    History of bronchitis    History of cardiac cath    a. normal cors by cath  in 04/2018 following an abnormal NST.   History of colon polyps    benign   History of echocardiogram    Echocardiogram 2/22: EF 65-70, no RWMA, normal diastolic function, normal RVSF, mild LAE, inadequate TR jet (indeterminate PASP)   Hyperlipidemia    Hypertension    Morbid obesity (HCC)    Sleep apnea    USES CPAP   Weakness    numbness and tingling. Takes Gabapentin daily     Allergies  Allergen Reactions   Coconut Flavor Anaphylaxis    Anything with coconut in it   Cephalosporins Hives   Morphine And Related     dizziness   Sulfa Antibiotics Hives and Rash   Sulfasalazine Hives and Rash   Ketorolac     Headaches and dizziness   Cefuroxime Axetil Itching and Rash   Estrogens Rash     Current Outpatient Medications  Medication Sig Dispense Refill   MYRBETRIQ 25 MG TB24 tablet Take 25 mg by mouth daily.     albuterol (VENTOLIN HFA) 108 (90 Base) MCG/ACT inhaler Inhale 2 puffs into the lungs every 6 (six) hours as needed for wheezing  or shortness of breath. 18 g 11   ARIPiprazole (ABILIFY) 2 MG tablet      aspirin EC 81 MG tablet Take 81 mg by mouth daily.     calcium-vitamin D (OSCAL WITH D) 500-200 MG-UNIT tablet Take 1 tablet by mouth daily with breakfast.     cetirizine (ZYRTEC) 10 MG tablet Take 10 mg by mouth daily.     eszopiclone (LUNESTA) 2 MG TABS tablet Take 2 mg by mouth at bedtime as needed for sleep. Take immediately before bedtime     Fluticasone-Umeclidin-Vilant (TRELEGY ELLIPTA) 100-62.5-25 MCG/INH AEPB Inhale 1 puff into the lungs daily. 1 each 11   furosemide (LASIX) 40 MG tablet Take 1 tablet (40 mg total) by mouth daily. 90 tablet 3   gabapentin (NEURONTIN) 300 MG capsule Take 900 mg by mouth 3 (three) times daily.      GLUCOSAMINE-CHONDROITIN PO Take 1 tablet by mouth at bedtime.      HYDROcodone-acetaminophen (NORCO/VICODIN) 5-325 MG tablet Take 1 tablet by mouth 3 (three) times daily as needed for moderate pain.     Ipratropium-Albuterol  (COMBIVENT IN) Inhale into the lungs.     losartan (COZAAR) 50 MG tablet TAKE ONE TABLET BY MOUTH DAILY. PLEASE SCHEDULE APPOINTMENT FOR FUTHER REFILLS. 30 tablet 6   miconazole (MICOTIN) 2 % cream Apply 1 application topically 2 (two) times daily as needed (fungus).     montelukast (SINGULAIR) 10 MG tablet Take 10 mg by mouth daily.      Multiple Vitamin (MULTIVITAMIN WITH MINERALS) TABS tablet Take 1 tablet by mouth daily.     NARCAN 4 MG/0.1ML LIQD nasal spray kit SMARTSIG:Spray(s) Both Nares     nitroGLYCERIN (NITROSTAT) 0.4 MG SL tablet Place 1 tablet (0.4 mg total) under the tongue every 5 (five) minutes as needed. 25 tablet 3   Omega-3 Fatty Acids (FISH OIL PO) Take 1,500 mg by mouth daily.     omeprazole (PRILOSEC) 20 MG capsule Take 40 mg by mouth daily before breakfast.      sertraline (ZOLOFT) 100 MG tablet Take 100 mg by mouth 2 (two) times daily.      simvastatin (ZOCOR) 80 MG tablet Take 40 mg by mouth every evening.     talc powder Apply 1 application topically as needed (moisture).     No current facility-administered medications for this visit.     Past Surgical History:  Procedure Laterality Date   ANTERIOR CERVICAL DECOMP/DISCECTOMY FUSION N/A 07/23/2016   Procedure: CERVICAL THREE-FOUR, CERVICAL FOUR-FIVE, CERVICAL FIVE-SIX ANTERIOR CERVICAL DECOMPRESSION/DISCECTOMY FUSION;  Surgeon: Earnie Larsson, MD;  Location: [place];  Service: Neurosurgery;  Laterality: N/A;   APPENDECTOMY     BACK SURGERY     x 4   BREAST REDUCTION SURGERY     CARPEL TUNNEL Bilateral    CATARACT EXTRACTION, BILATERAL  spring 2018   Dr. Lennox Laity   CHOLECYSTECTOMY     COLONOSCOPY     ESOPHAGOGASTRODUODENOSCOPY     LUMBAR LAMINECTOMY/DECOMPRESSION MICRODISCECTOMY Left 12/03/2012   Procedure: Left Lumbar Four to Five Diskectomy;  Surgeon: Floyce Stakes, MD;  Location: [facility];  Service: Neurosurgery;  Laterality: Left;  LUMBAR LAMINECTOMY/DECOMPRESSION MICRODISCECTOMY 1 LEVEL   RIGHT/LEFT HEART  CATH AND CORONARY ANGIOGRAPHY N/A 05/12/2018   Procedure: RIGHT/LEFT HEART CATH AND CORONARY ANGIOGRAPHY;  Surgeon: Troy Sine, MD;  Location: [place] [facility];  Service: Cardiovascular;  Laterality: N/A;   TOTAL KNEE ARTHROPLASTY Right    TOTAL KNEE ARTHROPLASTY Left 02/27/2017   Procedure: LEFT  TOTAL KNEE ARTHROPLASTY;  Surgeon: Earlie Server, MD;  Location: [place];  Service: Orthopedics;  Laterality: Left;     Allergies  Allergen Reactions   Coconut Flavor Anaphylaxis    Anything with coconut in it   Cephalosporins Hives   Morphine And Related     dizziness   Sulfa Antibiotics Hives and Rash   Sulfasalazine Hives and Rash   Ketorolac     Headaches and dizziness   Cefuroxime Axetil Itching and Rash   Estrogens Rash      Family History  Problem Relation Age of Onset   Heart disease Mother    Stroke Mother    Emphysema Father    Pneumonia Father    Stroke Maternal Grandmother      Social History Lynn Dixon reports that she quit smoking about 18 years ago. Her smoking use included cigarettes. She has a 80.00 pack-year smoking history. She has never used smokeless tobacco. Lynn Dixon reports no history of alcohol use.   Review of Systems CONSTITUTIONAL: No weight loss, fever, chills, weakness or fatigue.  HEENT: Eyes: No visual loss, blurred vision, double vision or yellow sclerae.No hearing loss, sneezing, congestion, runny nose or sore throat.  SKIN: No rash or itching.  CARDIOVASCULAR: per hpi RESPIRATORY: per hpi GASTROINTESTINAL: No anorexia, nausea, vomiting or diarrhea. No abdominal pain or blood.  GENITOURINARY: No burning on urination, no polyuria NEUROLOGICAL: No headache, dizziness, syncope, paralysis, ataxia, numbness or tingling in the extremities. No change in bowel or bladder control.  MUSCULOSKELETAL: No muscle, back pain, joint pain or stiffness.  LYMPHATICS: No enlarged nodes. No history of splenectomy.  PSYCHIATRIC: No history of depression or  anxiety.  ENDOCRINOLOGIC: No reports of sweating, cold or heat intolerance. No polyuria or polydipsia.  Marland Kitchen   Physical Examination Today's Vitals   05/15/22 1603  BP: (!) 130/55  Pulse: (!) 59  SpO2: 90%  Weight: 212 lb 3.2 oz (96.3 kg)  Height: 4' 9.5" (1.461 m)   Body mass index is 45.12 kg/m.  Gen: resting comfortably, no acute distress HEENT: no scleral icterus, pupils equal round and reactive, no palptable cervical adenopathy,  CV: RRR, no m/rg, no jvd Resp: Clear to auscultation bilaterally GI: abdomen is soft, non-tender, non-distended, normal bowel sounds, no hepatosplenomegaly MSK: extremities are warm, no edema.  Skin: warm, no rash Neuro:  no focal deficits Psych: appropriate affect   Diagnostic Studies  02/2016 echo LVEF 55-60%, no WMAS, grade I diastolic dysfunction.    03/2018 echo Study Conclusions   - Left ventricle: The cavity size was normal. Wall thickness was    normal. Systolic function was normal. The estimated ejection    fraction was in the range of 55% to 60%. Wall motion was normal;    there were no regional wall motion abnormalities. Indeterminate    diastolic function.  - Aortic valve: Mildly calcified annulus. Trileaflet.  - Mitral valve: Mildly calcified annulus. There was trivial    regurgitation.  - Right atrium: Central venous pressure (est): 3 mm Hg.  - Tricuspid valve: There was trivial regurgitation.  - Pulmonary arteries: Systolic pressure could not be accurately    estimated.  - Pericardium, extracardiac: A prominent pericardial fat pad was    present.    Jan 2020 RHC/LHC Mild right heart pressure elevation with mild pulmonary hypertension.   Normal LV function without focal segmental wall motion abnormalities; LVEDP 21 mmHg   Normal coronary arteries.     Assessment and Plan  1. DOE/Chest pain -  extensive cardiac workup as reproted above, overall benign findings other than mildly elevaetd filllig pressures, she is on  lasix. Symptoms primarily pulmonary in etiology, COPD and OSA. Overdue for f/u with pulm, given there contact info to schedule - continue diuretic   2. HTN -at goal, continue current meds   3. Hyperlipidemia - at goal, continue current meds   EKG today shows SR, PACs  F/u 1 year    Arnoldo Lenis, MD

## 2022-05-15 NOTE — Patient Instructions (Signed)

## 2022-05-16 ENCOUNTER — Encounter: Payer: Self-pay | Admitting: Family Medicine

## 2022-05-28 DIAGNOSIS — I10 Essential (primary) hypertension: Secondary | ICD-10-CM | POA: Diagnosis not present

## 2022-05-28 DIAGNOSIS — K760 Fatty (change of) liver, not elsewhere classified: Secondary | ICD-10-CM | POA: Diagnosis not present

## 2022-05-28 DIAGNOSIS — R739 Hyperglycemia, unspecified: Secondary | ICD-10-CM | POA: Diagnosis not present

## 2022-06-04 DIAGNOSIS — R030 Elevated blood-pressure reading, without diagnosis of hypertension: Secondary | ICD-10-CM | POA: Diagnosis not present

## 2022-06-04 DIAGNOSIS — D696 Thrombocytopenia, unspecified: Secondary | ICD-10-CM | POA: Diagnosis not present

## 2022-06-04 DIAGNOSIS — N3281 Overactive bladder: Secondary | ICD-10-CM | POA: Diagnosis not present

## 2022-06-04 DIAGNOSIS — J479 Bronchiectasis, uncomplicated: Secondary | ICD-10-CM | POA: Diagnosis not present

## 2022-06-04 DIAGNOSIS — J439 Emphysema, unspecified: Secondary | ICD-10-CM | POA: Diagnosis not present

## 2022-06-04 DIAGNOSIS — K760 Fatty (change of) liver, not elsewhere classified: Secondary | ICD-10-CM | POA: Diagnosis not present

## 2022-06-04 DIAGNOSIS — J9611 Chronic respiratory failure with hypoxia: Secondary | ICD-10-CM | POA: Diagnosis not present

## 2022-06-04 DIAGNOSIS — N1832 Chronic kidney disease, stage 3b: Secondary | ICD-10-CM | POA: Diagnosis not present

## 2022-07-15 ENCOUNTER — Ambulatory Visit (HOSPITAL_BASED_OUTPATIENT_CLINIC_OR_DEPARTMENT_OTHER): Payer: Non-veteran care | Admitting: Pulmonary Disease

## 2022-07-22 ENCOUNTER — Ambulatory Visit (INDEPENDENT_AMBULATORY_CARE_PROVIDER_SITE_OTHER): Payer: Medicare Other | Admitting: Internal Medicine

## 2022-07-22 ENCOUNTER — Encounter: Payer: Self-pay | Admitting: Internal Medicine

## 2022-07-22 VITALS — BP 126/78 | HR 55 | Temp 97.7°F | Ht <= 58 in | Wt 211.4 lb

## 2022-07-22 DIAGNOSIS — G4733 Obstructive sleep apnea (adult) (pediatric): Secondary | ICD-10-CM | POA: Diagnosis not present

## 2022-07-22 DIAGNOSIS — J441 Chronic obstructive pulmonary disease with (acute) exacerbation: Secondary | ICD-10-CM | POA: Diagnosis not present

## 2022-07-22 DIAGNOSIS — J9611 Chronic respiratory failure with hypoxia: Secondary | ICD-10-CM | POA: Diagnosis not present

## 2022-07-22 MED ORDER — BREZTRI AEROSPHERE 160-9-4.8 MCG/ACT IN AERO: 2.0000 | INHALATION_SPRAY | Freq: Two times a day (BID) | RESPIRATORY_TRACT | 5 refills | Status: DC

## 2022-07-22 NOTE — Progress Notes (Signed)
Genia Delancy L Virella    [id number]    December 05, 1950  Primary Care Physician:Burdine, Ananias PilgrimSteven E, MD Date of Appointment: 07/22/2022 Established Patient Visit  Chief complaint:   Chief Complaint  Patient presents with   Follow-up    Cpap f/u, pt wants to have a cxr done.      HPI: Genia Delancy L Isola is a 72 y.o. woman previously followed by Dr. Juanetta Gos[place]Hawkins and later Dr. Chestine Sporelark for COPD.  80 pack year smoking history, quit in 2005. Has OSA on CPAP.   Interval Updates: Last seen over 2 years ago by our office. Follows at the [facility] in [place].  Since the last time I saw her she has gone downhill with worsening dyspnea. Worsening chest tightness and wheezing. No interval hospitalizations or ED visits. Still on trelegy 1 puff once a day and albuterol rescue inhaler. Uses rescue inhaler twice a day.   Dyspnea with minimal exertion with ADLs.   Last took prednisone about 2 months ago, has had about 3 times total in the last year.   Seasonal allergies well controlled. She does not have a nebulizer machine at home.   Here with husband.   Current [facility] - [facility] is [facility] for a new machine.   [facility] - [facility]. Wears CPAP nightly with 3LNC bleed in. Current machine over 119 years old. Also has a concentrator at home for bleeding in and is through [facility].   I have reviewed the patient's family social and past medical history and updated as appropriate.   Past Medical History:  Diagnosis Date   Abnormal stress test    Allergy    Arthritis    BACK   Chronic neck pain    Constipation    takes Sennokot daily   COPD (chronic obstructive pulmonary disease)    Depression    PTSD-takes Zoloft daily   Diabetes mellitus without complication    diet controled   GERD (gastroesophageal reflux disease)    Glaucoma    mild in left eye   H/O hiatal hernia    History of blood transfusion    History of bronchitis    History of cardiac cath    a. normal cors by cath in 04/2018 following an  abnormal NST.   History of colon polyps    benign   History of echocardiogram    Echocardiogram 2/22: EF 65-70, no RWMA, normal diastolic function, normal RVSF, mild LAE, inadequate TR jet (indeterminate PASP)   Hyperlipidemia    Hypertension    Morbid obesity    Sleep apnea    USES CPAP   Weakness    numbness and tingling. Takes Gabapentin daily    Past Surgical History:  Procedure Laterality Date   ANTERIOR CERVICAL DECOMP/DISCECTOMY FUSION N/A 07/23/2016   Procedure: CERVICAL THREE-FOUR, CERVICAL FOUR-FIVE, CERVICAL FIVE-SIX ANTERIOR CERVICAL DECOMPRESSION/DISCECTOMY FUSION;  Surgeon: Julio SicksHenry Pool, MD;  [facility];  Service: Neurosurgery;  Laterality: N/A;   APPENDECTOMY     BACK SURGERY     x 4   BREAST REDUCTION SURGERY     CARPEL TUNNEL Bilateral    CATARACT EXTRACTION, BILATERAL  spring 2018   Dr. Darrick GrinderHechler   CHOLECYSTECTOMY     COLONOSCOPY     ESOPHAGOGASTRODUODENOSCOPY     LUMBAR LAMINECTOMY/DECOMPRESSION MICRODISCECTOMY Left 12/03/2012   Procedure: Left Lumbar Four to Five Diskectomy;  Surgeon: Karn CassisErnesto M Botero, MD;  [facility];  Service: Neurosurgery;  Laterality: Left;  LUMBAR LAMINECTOMY/DECOMPRESSION MICRODISCECTOMY 1 LEVEL   RIGHT/LEFT HEART CATH AND CORONARY ANGIOGRAPHY N/A 05/12/2018   Procedure: RIGHT/LEFT HEART CATH AND CORONARY ANGIOGRAPHY;  Surgeon: Lennette Bihari, MD;  [facility];  Service: Cardiovascular;  Laterality: N/A;   TOTAL KNEE ARTHROPLASTY Right    TOTAL KNEE ARTHROPLASTY Left 02/27/2017   Procedure: LEFT TOTAL KNEE ARTHROPLASTY;  Surgeon: Frederico Hamman, MD;  [facility];  Service: Orthopedics;  Laterality: Left;    Family History  Problem Relation Age of Onset   Heart disease Mother    Stroke Mother    Emphysema Father    Pneumonia Father    Stroke Maternal Grandmother     Social History   Occupational History   Not on file  Tobacco Use   Smoking status: Former    Packs/day: 2.00    Years: 40.00     Additional pack years: 0.00    Total pack years: 80.00    Types: Cigarettes    Quit date: 05/17/2004    Years since quitting: 18.1   Smokeless tobacco: Never  Vaping Use   Vaping Use: Never used  Substance and Sexual Activity   Alcohol use: No   Drug use: No   Sexual activity: Not on file     Physical Exam: Blood pressure 126/78, pulse (!) 55, temperature 97.7 F (36.5 C), temperature source Oral, height 4' 9.5" (1.461 m), weight 211 lb 6.4 oz (95.9 kg), SpO2 97 %.  Gen:      Chronically ill appearing, in wheel chair, on oxygen Lungs:    diminished, no wheezes or crackles CV:         RRR, mild lower extremity edema   Data Reviewed: Imaging: I have personally reviewed the chest xray nov 2018 shows no acute cardiopulmonary process, cervical hardware in place  PFTs:      Latest Ref Rng & Units 07/20/2019    1:30 PM 06/09/2018   10:41 AM  PFT Results  FVC-Pre L 2.59  2.73   FVC-Predicted Pre % 115  120   FVC-Post L 2.49  2.74   FVC-Predicted Post % 111  120   Pre FEV1/FVC % % 72  74   Post FEV1/FCV % % 78  72   FEV1-Pre L 1.87  2.03   FEV1-Predicted Pre % 111  118   FEV1-Post L 1.93  1.96   DLCO uncorrected ml/min/mmHg 13.03  9.78   DLCO UNC% % 84  63   DLCO corrected ml/min/mmHg 13.03    DLCO COR %Predicted % 84    DLVA Predicted % 76  64   TLC L 4.35  5.03   TLC % Predicted % 106  123   RV % Predicted % 81  119    I have personally reviewed the patient's PFTs and they are normal spirometry, lung volumes, diffusion capacity  Echocardiogram Feb 2021 shows:   1. Left ventricular ejection fraction, by estimation, is 65 to 70%. The  left ventricle has normal function. The left ventricle has no regional  wall motion abnormalities. Left ventricular diastolic parameters were  normal.   2. Right ventricular systolic function is normal. The right ventricular  size is normal.   3. Left atrial size was mildly dilated.   4. The mitral valve is normal in structure. No  evidence of mitral valve  regurgitation. No evidence of mitral stenosis.   5. The aortic valve was not well visualized. There is mild calcification  of the aortic valve.  There is mild thickening of the aortic valve. Aortic  valve regurgitation is not visualized. No aortic stenosis is present.   Labs:  Immunization status: Immunization History  Administered Date(s) Administered   Fluad Quad(high Dose 65+) 03/17/2019   Influenza,inj,Quad PF,6+ Mos 02/29/2016   Moderna Sars-Covid-2 Vaccination 05/29/2019, 06/26/2019    Assessment:  COPD Gold Stage 0 on PFTs with progression of symptoms OSA on CPAP, not well controlled.  GERD on PPI   Plan/Recommendations:  Full set of PFTs - 1 hour, come early prior to next appointment.  Stop trelegy try Breztri inhaler 2 puffs in the morning, 2 puffs at night, gargle after use.   Continue albuterol inhaler as needed.   We will need to get a compliance report from [facility] at your next visit to review your sleep apnea.   Continue PPI for GERD  She is outside the window for lung cancer screening, since she quit over 15 years ago.   Return to Care: Return in about 3 months (around 10/21/2022).   Durel Salts, MD Pulmonary and Critical Care Medicine [facility] Office:[phone]

## 2022-07-22 NOTE — Patient Instructions (Signed)
Please schedule follow up scheduled with myself in 3 months.  If my schedule is not open yet, we will contact you with a reminder closer to that time. Please call [phone] if you haven't heard from [place] a month before.   Before your next visit I would like you to have:  Full set of PFTs - 1 hour, come early prior to next appointment.  Stop trelegy try Breztri inhaler 2 puffs in the morning, 2 puffs at night, gargle after use.   Continue albuterol inhaler as needed.   We will need to get a compliance report from [facility] at your next visit to review your sleep apnea.

## 2022-07-31 ENCOUNTER — Telehealth: Payer: Self-pay | Admitting: Internal Medicine

## 2022-07-31 NOTE — Telephone Encounter (Signed)
I do not see where an order for cpap was placed during pt's visit with Dr. Celine Mans. Dr. Celine Mans, please advise if an order was supposed to have been sent to [facility] on pt.

## 2022-07-31 NOTE — Telephone Encounter (Signed)
We are still trying to receive a cpap download from them. She apparently already has a cpap and I am trying to review her data.

## 2022-07-31 NOTE — Telephone Encounter (Signed)
Lynn Dixon, a Resp Therapist is calling from [employer]. Pls resend Cpap order to his attention. They did not get it. Thanks.-

## 2022-08-13 DIAGNOSIS — Z1231 Encounter for screening mammogram for malignant neoplasm of breast: Secondary | ICD-10-CM | POA: Diagnosis not present

## 2022-08-18 DIAGNOSIS — H524 Presbyopia: Secondary | ICD-10-CM | POA: Diagnosis not present

## 2022-08-18 DIAGNOSIS — H35033 Hypertensive retinopathy, bilateral: Secondary | ICD-10-CM | POA: Diagnosis not present

## 2022-08-22 NOTE — Telephone Encounter (Signed)
Compliance received from [facility] and placed in Dr. Humphrey Rolls look at folder.

## 2022-08-25 ENCOUNTER — Other Ambulatory Visit: Payer: Self-pay | Admitting: Internal Medicine

## 2022-08-25 NOTE — Progress Notes (Unsigned)
Received PAP download. Patient wearing CPAP 14 cm H20. Wears 3LNC bleed in oxygen.  Median leak 7.9 L/M. AHI 4.7.   Please prescribe new machine, all necessary tubing and supplies and mask of best fit with CPAP 14 Cm H20. To [facility].

## 2022-08-26 ENCOUNTER — Telehealth: Payer: Self-pay

## 2022-08-26 DIAGNOSIS — G4733 Obstructive sleep apnea (adult) (pediatric): Secondary | ICD-10-CM

## 2022-08-26 NOTE — Progress Notes (Signed)
Addressed in telephone encounter dated 08/26/22.

## 2022-08-26 NOTE — Telephone Encounter (Signed)
Recived the following message from Dr. Celine Mans.    Charlott Holler, MDYesterday (8:51 AM)    Received PAP download. Patient wearing CPAP 14 cm H20. Wears 3LNC bleed in oxygen.   Median leak 7.9 L/M. AHI 4.7.    Please prescribe new machine, all necessary tubing and supplies and mask of best fit with CPAP 14 Cm H20. To [facility].     ATC left detailed message (per DPR) to notify pt and placed order. Nothing further needed at this time.

## 2022-08-27 DIAGNOSIS — J309 Allergic rhinitis, unspecified: Secondary | ICD-10-CM | POA: Diagnosis not present

## 2022-08-27 DIAGNOSIS — J0100 Acute maxillary sinusitis, unspecified: Secondary | ICD-10-CM | POA: Diagnosis not present

## 2022-09-17 DIAGNOSIS — E1165 Type 2 diabetes mellitus with hyperglycemia: Secondary | ICD-10-CM | POA: Diagnosis not present

## 2022-09-17 DIAGNOSIS — I10 Essential (primary) hypertension: Secondary | ICD-10-CM | POA: Diagnosis not present

## 2022-09-17 DIAGNOSIS — N1832 Chronic kidney disease, stage 3b: Secondary | ICD-10-CM | POA: Diagnosis not present

## 2022-09-17 DIAGNOSIS — R7989 Other specified abnormal findings of blood chemistry: Secondary | ICD-10-CM | POA: Diagnosis not present

## 2022-09-17 DIAGNOSIS — R7303 Prediabetes: Secondary | ICD-10-CM | POA: Diagnosis not present

## 2022-09-17 DIAGNOSIS — D696 Thrombocytopenia, unspecified: Secondary | ICD-10-CM | POA: Diagnosis not present

## 2022-09-17 DIAGNOSIS — Z1329 Encounter for screening for other suspected endocrine disorder: Secondary | ICD-10-CM | POA: Diagnosis not present

## 2022-09-24 DIAGNOSIS — D696 Thrombocytopenia, unspecified: Secondary | ICD-10-CM | POA: Diagnosis not present

## 2022-09-24 DIAGNOSIS — J439 Emphysema, unspecified: Secondary | ICD-10-CM | POA: Diagnosis not present

## 2022-09-24 DIAGNOSIS — Z0001 Encounter for general adult medical examination with abnormal findings: Secondary | ICD-10-CM | POA: Diagnosis not present

## 2022-09-24 DIAGNOSIS — R030 Elevated blood-pressure reading, without diagnosis of hypertension: Secondary | ICD-10-CM | POA: Diagnosis not present

## 2022-09-24 DIAGNOSIS — J479 Bronchiectasis, uncomplicated: Secondary | ICD-10-CM | POA: Diagnosis not present

## 2022-09-24 DIAGNOSIS — N1832 Chronic kidney disease, stage 3b: Secondary | ICD-10-CM | POA: Diagnosis not present

## 2022-09-24 DIAGNOSIS — M810 Age-related osteoporosis without current pathological fracture: Secondary | ICD-10-CM | POA: Diagnosis not present

## 2022-09-24 DIAGNOSIS — I700 Atherosclerosis of aorta: Secondary | ICD-10-CM | POA: Diagnosis not present

## 2022-09-24 DIAGNOSIS — K760 Fatty (change of) liver, not elsewhere classified: Secondary | ICD-10-CM | POA: Diagnosis not present

## 2022-09-24 DIAGNOSIS — J9611 Chronic respiratory failure with hypoxia: Secondary | ICD-10-CM | POA: Diagnosis not present

## 2022-09-24 DIAGNOSIS — N3281 Overactive bladder: Secondary | ICD-10-CM | POA: Diagnosis not present

## 2022-12-26 DIAGNOSIS — E559 Vitamin D deficiency, unspecified: Secondary | ICD-10-CM | POA: Diagnosis not present

## 2022-12-26 DIAGNOSIS — R7989 Other specified abnormal findings of blood chemistry: Secondary | ICD-10-CM | POA: Diagnosis not present

## 2023-02-05 DIAGNOSIS — S0990XA Unspecified injury of head, initial encounter: Secondary | ICD-10-CM | POA: Diagnosis not present

## 2023-02-05 DIAGNOSIS — S199XXA Unspecified injury of neck, initial encounter: Secondary | ICD-10-CM | POA: Diagnosis not present

## 2023-02-05 DIAGNOSIS — S161XXA Strain of muscle, fascia and tendon at neck level, initial encounter: Secondary | ICD-10-CM | POA: Diagnosis not present

## 2023-02-05 DIAGNOSIS — M542 Cervicalgia: Secondary | ICD-10-CM | POA: Diagnosis not present

## 2023-02-09 DIAGNOSIS — R600 Localized edema: Secondary | ICD-10-CM | POA: Diagnosis not present

## 2023-02-09 DIAGNOSIS — N1832 Chronic kidney disease, stage 3b: Secondary | ICD-10-CM | POA: Diagnosis not present

## 2023-02-09 DIAGNOSIS — S8012XA Contusion of left lower leg, initial encounter: Secondary | ICD-10-CM | POA: Diagnosis not present

## 2023-02-09 DIAGNOSIS — B372 Candidiasis of skin and nail: Secondary | ICD-10-CM | POA: Diagnosis not present

## 2023-02-09 DIAGNOSIS — M25562 Pain in left knee: Secondary | ICD-10-CM | POA: Diagnosis not present

## 2023-02-09 DIAGNOSIS — S0512XA Contusion of eyeball and orbital tissues, left eye, initial encounter: Secondary | ICD-10-CM | POA: Diagnosis not present

## 2023-02-09 DIAGNOSIS — J439 Emphysema, unspecified: Secondary | ICD-10-CM | POA: Diagnosis not present

## 2023-02-09 DIAGNOSIS — Z96652 Presence of left artificial knee joint: Secondary | ICD-10-CM | POA: Diagnosis not present

## 2023-02-09 DIAGNOSIS — R030 Elevated blood-pressure reading, without diagnosis of hypertension: Secondary | ICD-10-CM | POA: Diagnosis not present

## 2023-02-27 DIAGNOSIS — M1712 Unilateral primary osteoarthritis, left knee: Secondary | ICD-10-CM | POA: Diagnosis not present

## 2023-03-03 DIAGNOSIS — M1712 Unilateral primary osteoarthritis, left knee: Secondary | ICD-10-CM | POA: Diagnosis not present

## 2023-03-16 DIAGNOSIS — S81802A Unspecified open wound, left lower leg, initial encounter: Secondary | ICD-10-CM | POA: Diagnosis not present

## 2023-03-16 DIAGNOSIS — L03116 Cellulitis of left lower limb: Secondary | ICD-10-CM | POA: Diagnosis not present

## 2023-03-18 DIAGNOSIS — R600 Localized edema: Secondary | ICD-10-CM | POA: Diagnosis not present

## 2023-03-18 DIAGNOSIS — L03116 Cellulitis of left lower limb: Secondary | ICD-10-CM | POA: Diagnosis not present

## 2023-03-18 DIAGNOSIS — S81802A Unspecified open wound, left lower leg, initial encounter: Secondary | ICD-10-CM | POA: Diagnosis not present

## 2023-03-19 DIAGNOSIS — R739 Hyperglycemia, unspecified: Secondary | ICD-10-CM | POA: Diagnosis not present

## 2023-03-19 DIAGNOSIS — I10 Essential (primary) hypertension: Secondary | ICD-10-CM | POA: Diagnosis not present

## 2023-03-19 DIAGNOSIS — R7989 Other specified abnormal findings of blood chemistry: Secondary | ICD-10-CM | POA: Diagnosis not present

## 2023-03-19 DIAGNOSIS — R7303 Prediabetes: Secondary | ICD-10-CM | POA: Diagnosis not present

## 2023-03-19 DIAGNOSIS — N1832 Chronic kidney disease, stage 3b: Secondary | ICD-10-CM | POA: Diagnosis not present

## 2023-03-31 DIAGNOSIS — T148XXA Other injury of unspecified body region, initial encounter: Secondary | ICD-10-CM | POA: Diagnosis not present

## 2023-03-31 DIAGNOSIS — Z7982 Long term (current) use of aspirin: Secondary | ICD-10-CM | POA: Diagnosis not present

## 2023-03-31 DIAGNOSIS — Z7722 Contact with and (suspected) exposure to environmental tobacco smoke (acute) (chronic): Secondary | ICD-10-CM | POA: Diagnosis not present

## 2023-03-31 DIAGNOSIS — Z87891 Personal history of nicotine dependence: Secondary | ICD-10-CM | POA: Diagnosis not present

## 2023-03-31 DIAGNOSIS — Z91018 Allergy to other foods: Secondary | ICD-10-CM | POA: Diagnosis not present

## 2023-03-31 DIAGNOSIS — S81802A Unspecified open wound, left lower leg, initial encounter: Secondary | ICD-10-CM | POA: Diagnosis not present

## 2023-03-31 DIAGNOSIS — Z87828 Personal history of other (healed) physical injury and trauma: Secondary | ICD-10-CM | POA: Diagnosis not present

## 2023-03-31 DIAGNOSIS — Z888 Allergy status to other drugs, medicaments and biological substances status: Secondary | ICD-10-CM | POA: Diagnosis not present

## 2023-03-31 DIAGNOSIS — Z96652 Presence of left artificial knee joint: Secondary | ICD-10-CM | POA: Diagnosis not present

## 2023-03-31 DIAGNOSIS — J439 Emphysema, unspecified: Secondary | ICD-10-CM | POA: Diagnosis not present

## 2023-03-31 DIAGNOSIS — E785 Hyperlipidemia, unspecified: Secondary | ICD-10-CM | POA: Diagnosis not present

## 2023-03-31 DIAGNOSIS — Z885 Allergy status to narcotic agent status: Secondary | ICD-10-CM | POA: Diagnosis not present

## 2023-03-31 DIAGNOSIS — I739 Peripheral vascular disease, unspecified: Secondary | ICD-10-CM | POA: Diagnosis not present

## 2023-03-31 DIAGNOSIS — Z882 Allergy status to sulfonamides status: Secondary | ICD-10-CM | POA: Diagnosis not present

## 2023-03-31 DIAGNOSIS — R600 Localized edema: Secondary | ICD-10-CM | POA: Diagnosis not present

## 2023-03-31 DIAGNOSIS — I10 Essential (primary) hypertension: Secondary | ICD-10-CM | POA: Diagnosis not present

## 2023-03-31 DIAGNOSIS — Z881 Allergy status to other antibiotic agents status: Secondary | ICD-10-CM | POA: Diagnosis not present

## 2023-03-31 DIAGNOSIS — L97929 Non-pressure chronic ulcer of unspecified part of left lower leg with unspecified severity: Secondary | ICD-10-CM | POA: Diagnosis not present

## 2023-04-01 DIAGNOSIS — N3281 Overactive bladder: Secondary | ICD-10-CM | POA: Diagnosis not present

## 2023-04-01 DIAGNOSIS — K760 Fatty (change of) liver, not elsewhere classified: Secondary | ICD-10-CM | POA: Diagnosis not present

## 2023-04-01 DIAGNOSIS — I700 Atherosclerosis of aorta: Secondary | ICD-10-CM | POA: Diagnosis not present

## 2023-04-01 DIAGNOSIS — N1832 Chronic kidney disease, stage 3b: Secondary | ICD-10-CM | POA: Diagnosis not present

## 2023-04-01 DIAGNOSIS — J439 Emphysema, unspecified: Secondary | ICD-10-CM | POA: Diagnosis not present

## 2023-04-01 DIAGNOSIS — J9611 Chronic respiratory failure with hypoxia: Secondary | ICD-10-CM | POA: Diagnosis not present

## 2023-04-01 DIAGNOSIS — J479 Bronchiectasis, uncomplicated: Secondary | ICD-10-CM | POA: Diagnosis not present

## 2023-04-01 DIAGNOSIS — D696 Thrombocytopenia, unspecified: Secondary | ICD-10-CM | POA: Diagnosis not present

## 2023-04-01 DIAGNOSIS — R030 Elevated blood-pressure reading, without diagnosis of hypertension: Secondary | ICD-10-CM | POA: Diagnosis not present

## 2023-04-01 DIAGNOSIS — S8012XA Contusion of left lower leg, initial encounter: Secondary | ICD-10-CM | POA: Diagnosis not present

## 2023-04-03 DIAGNOSIS — T148XXA Other injury of unspecified body region, initial encounter: Secondary | ICD-10-CM | POA: Diagnosis not present

## 2023-04-07 DIAGNOSIS — J329 Chronic sinusitis, unspecified: Secondary | ICD-10-CM | POA: Diagnosis not present

## 2023-04-07 DIAGNOSIS — R0600 Dyspnea, unspecified: Secondary | ICD-10-CM | POA: Diagnosis not present

## 2023-04-07 DIAGNOSIS — R030 Elevated blood-pressure reading, without diagnosis of hypertension: Secondary | ICD-10-CM | POA: Diagnosis not present

## 2023-04-10 DIAGNOSIS — T148XXA Other injury of unspecified body region, initial encounter: Secondary | ICD-10-CM | POA: Diagnosis not present

## 2023-04-21 DIAGNOSIS — Z888 Allergy status to other drugs, medicaments and biological substances status: Secondary | ICD-10-CM | POA: Diagnosis not present

## 2023-04-21 DIAGNOSIS — Z882 Allergy status to sulfonamides status: Secondary | ICD-10-CM | POA: Diagnosis not present

## 2023-04-21 DIAGNOSIS — Z7722 Contact with and (suspected) exposure to environmental tobacco smoke (acute) (chronic): Secondary | ICD-10-CM | POA: Diagnosis not present

## 2023-04-21 DIAGNOSIS — Z96652 Presence of left artificial knee joint: Secondary | ICD-10-CM | POA: Diagnosis not present

## 2023-04-21 DIAGNOSIS — J439 Emphysema, unspecified: Secondary | ICD-10-CM | POA: Diagnosis not present

## 2023-04-21 DIAGNOSIS — I10 Essential (primary) hypertension: Secondary | ICD-10-CM | POA: Diagnosis not present

## 2023-04-21 DIAGNOSIS — R600 Localized edema: Secondary | ICD-10-CM | POA: Diagnosis not present

## 2023-04-21 DIAGNOSIS — Z7982 Long term (current) use of aspirin: Secondary | ICD-10-CM | POA: Diagnosis not present

## 2023-04-21 DIAGNOSIS — Z87891 Personal history of nicotine dependence: Secondary | ICD-10-CM | POA: Diagnosis not present

## 2023-04-21 DIAGNOSIS — E785 Hyperlipidemia, unspecified: Secondary | ICD-10-CM | POA: Diagnosis not present

## 2023-04-21 DIAGNOSIS — Z881 Allergy status to other antibiotic agents status: Secondary | ICD-10-CM | POA: Diagnosis not present

## 2023-04-21 DIAGNOSIS — S81802A Unspecified open wound, left lower leg, initial encounter: Secondary | ICD-10-CM | POA: Diagnosis not present

## 2023-04-21 DIAGNOSIS — Z885 Allergy status to narcotic agent status: Secondary | ICD-10-CM | POA: Diagnosis not present

## 2023-04-21 DIAGNOSIS — T148XXA Other injury of unspecified body region, initial encounter: Secondary | ICD-10-CM | POA: Diagnosis not present

## 2023-04-21 DIAGNOSIS — Z87828 Personal history of other (healed) physical injury and trauma: Secondary | ICD-10-CM | POA: Diagnosis not present

## 2023-04-21 DIAGNOSIS — Z91018 Allergy to other foods: Secondary | ICD-10-CM | POA: Diagnosis not present

## 2023-05-07 ENCOUNTER — Telehealth: Payer: Self-pay | Admitting: Cardiology

## 2023-05-07 DIAGNOSIS — M1712 Unilateral primary osteoarthritis, left knee: Secondary | ICD-10-CM | POA: Diagnosis not present

## 2023-05-07 DIAGNOSIS — M25562 Pain in left knee: Secondary | ICD-10-CM | POA: Diagnosis not present

## 2023-05-07 NOTE — Telephone Encounter (Signed)
   Name: Lynn Dixon  DOB: Dec 01, 1950  MRN: [id number]  Primary Cardiologist: Dina Rich, MD  Chart reviewed as part of pre-operative protocol coverage. Because of NIEMAH DEARMAN past medical history and time since last visit, she will require a follow-up in-office visit in order to better assess preoperative cardiovascular risk.  Pre-op covering staff: - Please schedule appointment and call patient to inform them. If patient already had an upcoming appointment within acceptable timeframe, please add "pre-op clearance" to the appointment notes so provider is aware. - Please contact requesting surgeon's office via preferred method (i.e, phone, fax) to inform them of need for appointment prior to surgery.  Napoleon Form, Leodis Rains, NP  05/07/2023, 5:13 PM

## 2023-05-07 NOTE — Telephone Encounter (Signed)
Pt has been scheduled in office appt 06/05/23 with Sharlene Dory, NP in the [place] office for preop clearance. Pt asked if sooner appt. I said that is what I had right now. I assured the pt I will send a message to the scheduling team in [place] to see if they can call her with a sooner appt for preop. Pt said thank you.

## 2023-05-07 NOTE — Telephone Encounter (Signed)
   Pre-operative Risk Assessment    Patient Name: Lynn Dixon  DOB: 07-28-1950 MRN: [id number]      Request for Surgical Clearance    Procedure:   Revision LT Total Knee Replacement  Date of Surgery:  Clearance TBD                                 Surgeon:  Weber Cooks, M.D. Surgeon's Group or Practice Name:  [facility] Phone number:  [phone] Fax number:  [phone]   Type of Clearance Requested:   - Medical    Type of Anesthesia:  Spinal   Additional requests/questions:  Please fax a copy of Clearance to the surgeon's office.  Signed, Delaine Lame   05/07/2023, 4:59 PM

## 2023-05-11 ENCOUNTER — Telehealth: Payer: Self-pay | Admitting: Internal Medicine

## 2023-05-11 NOTE — Telephone Encounter (Signed)
Fax received from Dr. Weber Cooks with [facility] to perform a revision left total knee replacement on patient under spinal anesthesia.  Patient needs surgery clearance. Surgery is pending. Patient was seen on 07/22/22. Office protocol is a risk assessment can be sent to surgeon if patient has been seen in 60 days or less.   Pt needs appt for clearance.    I called her and scheduled her for appt with Dr Celine Mans 06/01/23   Will hold in clearance basket until done

## 2023-05-11 NOTE — Telephone Encounter (Signed)
Pre-operative Risk Assessment    Patient Name: Lynn Dixon  DOB: Feb 12, 1951 MRN: [id number]      Request for Surgical Clearance    Procedure:   Revision LT Total Knee Replacement  Date of Surgery:  Clearance TBD                                 Surgeon:  Weber Cooks, M.D. Surgeon's Group or Practice Name:  [facility] Phone number:  [phone] Fax number:  [phone]   Type of Clearance Requested:   - Medical    Type of Anesthesia:  Spinal   Additional requests/questions:  Please fax a copy of Clearance to the surgeon's office.  Signed, Delaine Lame   05/07/2023, 4:59 PM

## 2023-05-18 DIAGNOSIS — M1712 Unilateral primary osteoarthritis, left knee: Secondary | ICD-10-CM | POA: Diagnosis not present

## 2023-05-25 ENCOUNTER — Encounter: Payer: Self-pay | Admitting: Nurse Practitioner

## 2023-05-25 ENCOUNTER — Ambulatory Visit: Payer: Medicare Other | Attending: Nurse Practitioner | Admitting: Nurse Practitioner

## 2023-05-25 VITALS — BP 120/74 | HR 60 | Ht <= 58 in | Wt 207.0 lb

## 2023-05-25 DIAGNOSIS — J449 Chronic obstructive pulmonary disease, unspecified: Secondary | ICD-10-CM | POA: Diagnosis not present

## 2023-05-25 DIAGNOSIS — I10 Essential (primary) hypertension: Secondary | ICD-10-CM | POA: Diagnosis not present

## 2023-05-25 DIAGNOSIS — R0609 Other forms of dyspnea: Secondary | ICD-10-CM

## 2023-05-25 DIAGNOSIS — Z01810 Encounter for preprocedural cardiovascular examination: Secondary | ICD-10-CM

## 2023-05-25 DIAGNOSIS — R079 Chest pain, unspecified: Secondary | ICD-10-CM

## 2023-05-25 DIAGNOSIS — G4733 Obstructive sleep apnea (adult) (pediatric): Secondary | ICD-10-CM | POA: Diagnosis not present

## 2023-05-25 NOTE — Patient Instructions (Addendum)
 Medication Instructions:  Your physician recommends that you continue on your current medications as directed. Please refer to the Current Medication list given to you today.  Labwork: None   Testing/Procedures: Your physician has requested that you have an echocardiogram. Echocardiography is a painless test that uses sound waves to create images of your heart. It provides your doctor with information about the size and shape of your heart and how well your heart's chambers and valves are working. This procedure takes approximately one hour. There are no restrictions for this procedure. Please do NOT wear cologne, perfume, aftershave, or lotions (deodorant is allowed). Please arrive 15 minutes prior to your appointment time.  Please note: We ask at that you not bring children with you during ultrasound (echo/ vascular) testing. Due to room size and safety concerns, children are not allowed in the ultrasound rooms during exams. Our front office staff cannot provide observation of children in our lobby area while testing is being conducted. An adult accompanying a patient to their appointment will only be allowed in the ultrasound room at the discretion of the ultrasound technician under special circumstances. We apologize for any inconvenience.  Follow-Up: Your physician recommends that you schedule a follow-up appointment in: 3 months   Any Other Special Instructions Will Be Listed Below (If Applicable).  If you need a refill on your cardiac medications before your next appointment, please call your pharmacy.

## 2023-05-25 NOTE — Progress Notes (Addendum)
 Cardiology Office Note:  .   Date:  05/25/2023  ID:  Lynn Dixon, DOB 08-Jan-1951, MRN [id number] PCP: Juliette Alcide, MD  [facility] Providers Cardiologist:  Dina Rich, MD    History of Present Illness: .   Lynn Dixon is a 73 y.o. female with a PMH of chest pain, hypertension, hyperlipidemia, history of remote tobacco abuse, DOE, COPD, and OSA, presents today for preoperative cardiovascular risk assessment.  History of extensive cardiac workup for symptoms of chest pain/DOE.  Overall workup revealed benign findings.  Was felt that symptoms were primarily pulmonology in etiology, related to OSA and COPD.  Last seen by Dr. Dina Rich on May 15, 2022.  Was overall doing well at that time.  Our office has been contacted regarding preoperative cardiovascular risk assessment.  She is pending revision left total knee replacement. Surgeon will be Dr. Weber Cooks of [facility].  Surgery date is TBD. She admits to worsening shortness of breath with exertion since last office visit. Notes associated chest pain similar to last office visit with her shortness of breath when she "overdoes it." Says her shortness of breath occurs when walking long distances or with doing high exertional activities like raking leaves. Denies any palpitations, syncope, presyncope, dizziness, orthopnea, PND, swelling or significant weight changes, acute bleeding, or claudication.  ROS: Negative. See HPI.   Studies Reviewed: Marland Kitchen    EKG:  EKG Interpretation Date/Time:  Monday May 25 2023 09:59:05 EST Ventricular Rate:  66 PR Interval:  130 QRS Duration:  80 QT Interval:  376 QTC Calculation: 394 R Axis:   -47  Text Interpretation: Normal sinus rhythm Left axis deviation Low voltage QRS Possible Anterolateral infarct , age undetermined When compared with ECG of 15-Jul-2016 10:32, Borderline criteria for Anterior infarct are now Present Borderline criteria for  Anterolateral infarct are now Present Nonspecific T wave abnormality now evident in Lateral leads Confirmed by Sharlene Dory [phone]) on 05/25/2023 10:27:09 AM   Echo 05/2020:  1. Left ventricular ejection fraction, by estimation, is 65 to 70%. The  left ventricle has normal function. The left ventricle has no regional  wall motion abnormalities. Left ventricular diastolic parameters were  normal.   2. Right ventricular systolic function is normal. The right ventricular  size is normal.   3. Left atrial size was mildly dilated.   4. The mitral valve is normal in structure. No evidence of mitral valve  regurgitation. No evidence of mitral stenosis.   5. The aortic valve was not well visualized. There is mild calcification  of the aortic valve. There is mild thickening of the aortic valve. Aortic  valve regurgitation is not visualized. No aortic stenosis is present.  Right/left heart cath 04/2018:  Mild right heart pressure elevation with mild pulmonary hypertension.   Normal LV function without focal segmental wall motion abnormalities; LVEDP 21 mmHg   Normal coronary arteries.   RECOMMENDATION: Medical therapy.  Suspect the patient's underlying COPD may be contributing to her exertional dyspnea.  Physical Exam:   VS:  BP 120/74   Pulse 60   Ht 4\' 9"  (1.448 m)   Wt 207 lb (93.9 kg)   SpO2 93%   BMI 44.79 kg/m    Wt Readings from Last 3 Encounters:  05/25/23 207 lb (93.9 kg)  07/22/22 211 lb 6.4 oz (95.9 kg)  05/15/22 212 lb 3.2 oz (96.3 kg)    GEN: Morbidly obese, 73 y.o. female in no acute distress NECK: No JVD;  No carotid bruits CARDIAC: S1/S2, RRR, no murmurs, rubs, gallops RESPIRATORY:  Clear to auscultation without rales, wheezing or rhonchi  ABDOMEN: Soft, non-tender, non-distended EXTREMITIES:  No edema; No deformity   ASSESSMENT AND PLAN: .    Pre-operative cardiovascular risk assessment Lynn Dixon's perioperative risk of a major cardiac event is 0.4% according to  the Revised Cardiac Risk Index (RCRI).  Therefore, she is at low risk for perioperative complications.   Her functional capacity is fair at 4.64 METs according to the Duke Activity Status Index (DASI). Recommendations: The patient requires an echocardiogram before a disposition can be made regarding surgical risk.               Once Echo comes back WNL, will addendum note and route recs to requesting party.  Antiplatelet and/or Anticoagulation Recommendations: Aspirin can be held for 7 days prior to her surgery.  Please resume Aspirin post operatively when it is felt to be safe from a bleeding standpoint.   Addendum 06/03/2023: Echocardiogram report is overall within normal limits.  She can proceed to her procedure.  See recommendations as noted above.  2. Chest pain, DOE Admits to intermittent, chronic episodes of chest pain with overexertion. Admits to worsening shortness of breath with exertion. Will update Echo at this time as noted above.  Right and left heart cath in 2020 showed normal coronary arteries.  12 lead ECG today is reassuring. Echo in 2022 showed normal LVEF. No medication changes at this time. Care and ED precautions discussed.  Follow-up with PCP and pulmonology as scheduled.  3. HTN Blood pressure is well-controlled. Discussed to monitor BP at home at least 2 hours after medications and sitting for 5-10 minutes.  No medication changes at this time. Heart healthy diet and regular cardiovascular exercise encouraged. Continue to follow with PCP.  4. COPD/OSA Admits to worsening dyspnea on exertion as noted above.  Etiology of chest pain/DOE felt to be pulmonary in nature.  Updating echocardiogram as mentioned above.  Recommended to follow-up with PCP and pulmonology for further evaluation.  No medication changes at this time. Care and ED precautions discussed.   5. Morbid obesity Weight loss via diet and exercise encouraged. Discussed the impact being overweight would have on  cardiovascular risk.  Dispo: Follow-up with me/APP in 3 months or sooner if anything changes.  Signed, Sharlene Dory, NP

## 2023-05-26 ENCOUNTER — Ambulatory Visit: Payer: Medicare Other | Attending: Nurse Practitioner

## 2023-05-26 DIAGNOSIS — Z01818 Encounter for other preprocedural examination: Secondary | ICD-10-CM | POA: Diagnosis not present

## 2023-05-26 DIAGNOSIS — M810 Age-related osteoporosis without current pathological fracture: Secondary | ICD-10-CM | POA: Diagnosis not present

## 2023-05-26 DIAGNOSIS — D649 Anemia, unspecified: Secondary | ICD-10-CM | POA: Diagnosis not present

## 2023-05-26 DIAGNOSIS — E1165 Type 2 diabetes mellitus with hyperglycemia: Secondary | ICD-10-CM | POA: Diagnosis not present

## 2023-05-26 DIAGNOSIS — R0609 Other forms of dyspnea: Secondary | ICD-10-CM | POA: Diagnosis not present

## 2023-05-26 DIAGNOSIS — R509 Fever, unspecified: Secondary | ICD-10-CM | POA: Diagnosis not present

## 2023-05-26 LAB — ECHOCARDIOGRAM COMPLETE
AR max vel: 1.9 cm2
AV Area VTI: 2.04 cm2
AV Area mean vel: 2.03 cm2
AV Mean grad: 5 mm[Hg]
AV Peak grad: 10.8 mm[Hg]
Ao pk vel: 1.64 m/s
Area-P 1/2: 2.95 cm2
Calc EF: 52.8 %
MV VTI: 1.58 cm2
Single Plane A2C EF: 52.4 %
Single Plane A4C EF: 51.9 %

## 2023-06-01 ENCOUNTER — Ambulatory Visit (INDEPENDENT_AMBULATORY_CARE_PROVIDER_SITE_OTHER): Payer: Non-veteran care | Admitting: Internal Medicine

## 2023-06-01 ENCOUNTER — Encounter: Payer: Self-pay | Admitting: Internal Medicine

## 2023-06-01 VITALS — BP 136/88 | HR 57 | Ht <= 58 in | Wt 209.0 lb

## 2023-06-01 DIAGNOSIS — J9611 Chronic respiratory failure with hypoxia: Secondary | ICD-10-CM

## 2023-06-01 DIAGNOSIS — G4733 Obstructive sleep apnea (adult) (pediatric): Secondary | ICD-10-CM

## 2023-06-01 DIAGNOSIS — K219 Gastro-esophageal reflux disease without esophagitis: Secondary | ICD-10-CM

## 2023-06-01 DIAGNOSIS — Z01811 Encounter for preprocedural respiratory examination: Secondary | ICD-10-CM | POA: Diagnosis not present

## 2023-06-01 DIAGNOSIS — J4489 Other specified chronic obstructive pulmonary disease: Secondary | ICD-10-CM

## 2023-06-01 DIAGNOSIS — J439 Emphysema, unspecified: Secondary | ICD-10-CM | POA: Diagnosis not present

## 2023-06-01 MED ORDER — TRELEGY ELLIPTA 100-62.5-25 MCG/ACT IN AEPB: 1.0000 | INHALATION_SPRAY | Freq: Every day | RESPIRATORY_TRACT | 5 refills | Status: AC

## 2023-06-01 MED ORDER — ALBUTEROL SULFATE (2.5 MG/3ML) 0.083% IN NEBU: 2.5000 mg | INHALATION_SOLUTION | Freq: Four times a day (QID) | RESPIRATORY_TRACT | 12 refills | Status: AC | PRN

## 2023-06-01 NOTE — Patient Instructions (Addendum)
 It was a pleasure to see you today!  Please schedule follow up scheduled with myself in 3 months.  If my schedule is not open yet, we will contact you with a reminder closer to that time. Please call [phone] if you haven't heard from [place] a month before, and always call us sooner if issues or concerns arise. You can also send [place] a message through MyChart, but but aware that this is not to be used for urgent issues and it may take up to 5-7 days to receive a reply. Please be aware that you will likely be able to view your results before I have a chance to respond to them. Please give [place] 5 business days to respond to any non-urgent results.    Good luck with your knee surgery! Continue the trelegy 1 puff once daily.   I am sending a nebulizer machine to your pharmacy and albuterol nebulizer treatments.  Use this at home when you start getting sick, every 4 hours as needed.   Take the albuterol rescue inhaler or nebulizer treatment every 4 to 6 hours as needed for wheezing or shortness of breath. You can also take it 15 minutes before exercise or exertional activity. Side effects include heart racing or pounding, jitters or anxiety. If you have a history of an irregular heart rhythm, it can make this worse. Can also give some patients a hard time sleeping.  I am adjusting your face mask and settings on cpap. Hopefully this helps make the cpap more effective.

## 2023-06-01 NOTE — Telephone Encounter (Signed)
 Ov note 06/01/23 was faxed to [facility].
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
## 2023-06-05 ENCOUNTER — Ambulatory Visit: Payer: Non-veteran care | Admitting: Nurse Practitioner

## 2023-06-09 DIAGNOSIS — T148XXA Other injury of unspecified body region, initial encounter: Secondary | ICD-10-CM | POA: Diagnosis not present

## 2023-07-28 DIAGNOSIS — M545 Low back pain, unspecified: Secondary | ICD-10-CM | POA: Diagnosis not present

## 2023-07-28 DIAGNOSIS — M1712 Unilateral primary osteoarthritis, left knee: Secondary | ICD-10-CM | POA: Diagnosis not present

## 2023-07-30 DIAGNOSIS — M1712 Unilateral primary osteoarthritis, left knee: Secondary | ICD-10-CM | POA: Diagnosis not present

## 2023-08-03 ENCOUNTER — Ambulatory Visit: Payer: Self-pay | Admitting: Emergency Medicine

## 2023-08-03 DIAGNOSIS — G8929 Other chronic pain: Secondary | ICD-10-CM

## 2023-08-03 DIAGNOSIS — T84018A Broken internal joint prosthesis, other site, initial encounter: Secondary | ICD-10-CM

## 2023-08-03 NOTE — H&P (View-Only) (Signed)
 TOTAL KNEE REVISION ADMISSION H&P  Patient is being admitted for left revision total knee arthroplasty.  Subjective:  Chief Complaint:left knee pain.  HPI: Lynn Dixon, 73 y.o. female, has a history of pain and functional disability in the left knee(s) due to failed previous arthroplasty and patient has failed non-surgical conservative treatments for greater than 12 weeks to include NSAID's and/or analgesics, supervised PT with diminished ADL's post treatment, use of assistive devices, weight reduction as appropriate, and activity modification. The indications for the revision of the total knee arthroplasty are  subsidence of tibial component . Onset of symptoms was gradual starting  < 1  years ago with gradually worsening course since that time.  Prior procedures on the left knee(s) include arthroplasty.  Patient currently rates pain in the left knee(s) at 8 out of 10 with activity. There is night pain, worsening of pain with activity and weight bearing, pain that interferes with activities of daily living, and pain with passive range of motion.  Patient has evidence of  subsidence of the tibial plate  by imaging studies. This condition presents safety issues increasing the risk of falls. There is no current active infection.  Patient Active Problem List   Diagnosis Date Noted   Morbid obesity (HCC) 02/28/2020   Angina pectoris (HCC)    Exertional dyspnea    Primary localized osteoarthritis of left knee 02/27/2017   Cervical stenosis of spinal canal 07/23/2016   COPD exacerbation (HCC)    Centrilobular emphysema (HCC)    OSA (obstructive sleep apnea)    Other hyperlipidemia    Endotracheally intubated    Encounter for orogastric (OG) tube placement    Community acquired pneumonia of left lower lobe of lung 02/24/2016   Foraminal stenosis of lumbar region 12/04/2012   UTI (urinary tract infection) 11/30/2012   Acute lumbar back pain 11/28/2012   Left leg weakness 11/28/2012   HTN  (hypertension), benign 11/28/2012   Other and unspecified hyperlipidemia 11/28/2012   COPD (chronic obstructive pulmonary disease) (HCC) 11/28/2012   Dehydration 11/28/2012   Past Medical History:  Diagnosis Date   Abnormal stress test    Allergy    Arthritis    BACK   Chronic neck pain    Constipation    takes Sennokot daily   COPD (chronic obstructive pulmonary disease) (HCC)    Depression    PTSD-takes Zoloft  daily   Diabetes mellitus without complication (HCC)    diet controled   GERD (gastroesophageal reflux disease)    Glaucoma    mild in left eye   H/O hiatal hernia    History of blood transfusion    History of bronchitis    History of cardiac cath    a. normal cors by cath in 04/2018 following an abnormal NST.   History of colon polyps    benign   History of echocardiogram    Echocardiogram 2/22: EF 65-70, no RWMA, normal diastolic function, normal RVSF, mild LAE, inadequate TR jet (indeterminate PASP)   Hyperlipidemia    Hypertension    Morbid obesity (HCC)    Sleep apnea    USES CPAP   Weakness    numbness and tingling. Takes Gabapentin  daily    Past Surgical History:  Procedure Laterality Date   ANTERIOR CERVICAL DECOMP/DISCECTOMY FUSION N/A 07/23/2016   Procedure: CERVICAL THREE-FOUR, CERVICAL FOUR-FIVE, CERVICAL FIVE-SIX ANTERIOR CERVICAL DECOMPRESSION/DISCECTOMY FUSION;  Surgeon: Agustina Aldrich, MD;  Location: [facility];  Service: Neurosurgery;  Laterality: N/A;   APPENDECTOMY  BACK SURGERY     x 4   BREAST REDUCTION SURGERY     CARPEL TUNNEL Bilateral    CATARACT EXTRACTION, BILATERAL  spring 2018   Dr. Consuela Denier   CHOLECYSTECTOMY     COLONOSCOPY     ESOPHAGOGASTRODUODENOSCOPY     LUMBAR LAMINECTOMY/DECOMPRESSION MICRODISCECTOMY Left 12/03/2012   Procedure: Left Lumbar Four to Five Diskectomy;  Surgeon: Adelbert Adler, MD;  Location: [facility];  Service: Neurosurgery;  Laterality: Left;  LUMBAR LAMINECTOMY/DECOMPRESSION MICRODISCECTOMY 1 LEVEL    RIGHT/LEFT HEART CATH AND CORONARY ANGIOGRAPHY N/A 05/12/2018   Procedure: RIGHT/LEFT HEART CATH AND CORONARY ANGIOGRAPHY;  Surgeon: Millicent Ally, MD;  Location: [facility];  Service: Cardiovascular;  Laterality: N/A;   TOTAL KNEE ARTHROPLASTY Right    TOTAL KNEE ARTHROPLASTY Left 02/27/2017   Procedure: LEFT TOTAL KNEE ARTHROPLASTY;  Surgeon: Marlena Sima, MD;  Location: [facility] OR;  Service: Orthopedics;  Laterality: Left;    Current Outpatient Medications  Medication Sig Dispense Refill Last Dose/Taking   albuterol  (PROVENTIL ) (2.5 MG/3ML) 0.083% nebulizer solution Take 3 mLs (2.5 mg total) by nebulization every 6 (six) hours as needed for wheezing or shortness of breath. 75 mL 12    albuterol  (VENTOLIN  HFA) 108 (90 Base) MCG/ACT inhaler Inhale 2 puffs into the lungs every 6 (six) hours as needed for wheezing or shortness of breath. 18 g 11    ARIPiprazole (ABILIFY) 2 MG tablet       aspirin  EC 81 MG tablet Take 81 mg by mouth daily.      atorvastatin  (LIPITOR) 20 MG tablet Take 20 mg by mouth daily.      calcium -vitamin D  (OSCAL WITH D) 500-200 MG-UNIT tablet Take 1 tablet by mouth daily with breakfast.      cetirizine (ZYRTEC) 10 MG tablet Take 10 mg by mouth daily.      eszopiclone (LUNESTA) 2 MG TABS tablet Take 2 mg by mouth at bedtime as needed for sleep. Take immediately before bedtime      Fluticasone-Umeclidin-Vilant (TRELEGY ELLIPTA ) 100-62.5-25 MCG/ACT AEPB Inhale 1 puff into the lungs daily. 1 each 5    furosemide  (LASIX ) 40 MG tablet Take 1 tablet (40 mg total) by mouth daily. 90 tablet 3    gabapentin  (NEURONTIN ) 300 MG capsule Take 900 mg by mouth 3 (three) times daily.       GLUCOSAMINE-CHONDROITIN PO Take 1 tablet by mouth at bedtime.       HYDROcodone -acetaminophen  (NORCO/VICODIN) 5-325 MG tablet Take 1 tablet by mouth 3 (three) times daily as needed for moderate pain (pain score 4-6).      losartan  (COZAAR ) 50 MG tablet TAKE ONE TABLET BY MOUTH DAILY. PLEASE  SCHEDULE APPOINTMENT FOR FUTHER REFILLS. 30 tablet 6    miconazole (MICOTIN) 2 % cream Apply 1 application topically 2 (two) times daily as needed (fungus).      montelukast  (SINGULAIR ) 10 MG tablet Take 10 mg by mouth daily.       Multiple Vitamin (MULTIVITAMIN WITH MINERALS) TABS tablet Take 1 tablet by mouth daily.      MYRBETRIQ 25 MG TB24 tablet Take 25 mg by mouth daily.      nitroGLYCERIN  (NITROSTAT ) 0.4 MG SL tablet Place 1 tablet (0.4 mg total) under the tongue every 5 (five) minutes as needed. 25 tablet 3    Omega-3 Fatty Acids  (FISH OIL PO) Take 1,500 mg by mouth daily.      omeprazole  (PRILOSEC) 20 MG capsule Take 40 mg by mouth daily before  breakfast.       sertraline  (ZOLOFT ) 100 MG tablet Take 100 mg by mouth 2 (two) times daily.       talc powder Apply 1 application topically as needed (moisture).      No current facility-administered medications for this visit.   Allergies  Allergen Reactions   Coconut Flavoring Agent (Non-Screening) Anaphylaxis    Anything with coconut in it   Cephalosporins Hives   Morphine And Codeine     dizziness   Sulfa Antibiotics Hives and Rash   Sulfasalazine Hives and Rash   Ketorolac      Headaches and dizziness   Cefuroxime Axetil Itching and Rash   Estrogens Rash    Social History   Tobacco Use   Smoking status: Former    Current packs/day: 0.00    Average packs/day: 2.0 packs/day for 40.0 years (80.0 ttl pk-yrs)    Types: Cigarettes    Start date: 05/17/1964    Quit date: 05/17/2004    Years since quitting: 19.2   Smokeless tobacco: Never  Substance Use Topics   Alcohol use: No    Family History  Problem Relation Age of Onset   Heart disease Mother    Stroke Mother    Emphysema Father    Pneumonia Father    Stroke Maternal Grandmother       Review of Systems  Musculoskeletal:  Positive for arthralgias.  All other systems reviewed and are negative.    Objective:  Physical Exam Constitutional:      General: She is not  in acute distress.    Appearance: Normal appearance. She is not ill-appearing.  HENT:     Head: Normocephalic and atraumatic.     Right Ear: External ear normal.     Left Ear: External ear normal.     Nose: Nose normal.     Mouth/Throat:     Mouth: Mucous membranes are moist.     Pharynx: Oropharynx is clear.  Eyes:     Extraocular Movements: Extraocular movements intact.     Conjunctiva/sclera: Conjunctivae normal.  Cardiovascular:     Rate and Rhythm: Normal rate and regular rhythm.     Pulses: Normal pulses.     Heart sounds: Normal heart sounds.  Pulmonary:     Effort: Pulmonary effort is normal.     Breath sounds: Normal breath sounds.  Abdominal:     General: Bowel sounds are normal.     Palpations: Abdomen is soft.     Tenderness: There is no abdominal tenderness.  Musculoskeletal:        General: Tenderness present.     Cervical back: Normal range of motion and neck supple.     Comments: TTP over medial and lateral joint line, medial worse than lateral.  No calf tenderness, swelling, or erythema.  Vertical well-healed incision over left anterior knee, otherwise no overlying lesions of area of chief complaint.  Decreased strength and ROM due to elicited pain.  Dorsiflexion and plantarflexion intact.  Stable to varus and valgus stress.  BLE appear grossly neurovascularly intact.  Gait mildly antalgic.   Skin:    General: Skin is warm and dry.  Neurological:     Mental Status: She is alert and oriented to person, place, and time. Mental status is at baseline.  Psychiatric:        Mood and Affect: Mood normal.        Behavior: Behavior normal.     Vital signs in last 24 hours: @VSRANGES @  Labs:  Estimated body mass index is 45.23 kg/m as calculated from the following:   Height as of 06/01/23: 4\' 9"  (1.448 m).   Weight as of 06/01/23: 94.8 kg.  Imaging Review Plain radiographs demonstrate subsidence of the lateral tibial component of the left knee(s). The overall  alignment is mild valgus. Again there is evidence of loosening of the tibial components. The bone quality appears to be fair for age and reported activity level.      Assessment/Plan:  Aseptic loosening with tibial component failure, left knee(s) with failed previous arthroplasty.   The patient history, physical examination, clinical judgment of the provider and imaging studies are consistent with aseptic loosening of the left knee(s), previous total knee arthroplasty. Revision total knee arthroplasty is deemed medically necessary. The treatment options including medical management, injection therapy, arthroscopy and revision arthroplasty were discussed at length. The risks and benefits of revision total knee arthroplasty were presented and reviewed. The risks due to aseptic loosening, infection, stiffness, patella tracking problems, thromboembolic complications and other imponderables were discussed. The patient acknowledged the explanation, agreed to proceed with the plan and consent was signed. Patient is being admitted for inpatient treatment for surgery, pain control, PT, OT, prophylactic antibiotics, VTE prophylaxis, progressive ambulation and ADL's and discharge planning.The patient is planning to be discharged home with HHPT ([facility]).

## 2023-08-03 NOTE — H&P (Signed)
 TOTAL KNEE REVISION ADMISSION H&P  Patient is being admitted for left revision total knee arthroplasty.  Subjective:  Chief Complaint:left knee pain.  HPI: Lynn Dixon, 73 y.o. female, has a history of pain and functional disability in the left knee(s) due to failed previous arthroplasty and patient has failed non-surgical conservative treatments for greater than 12 weeks to include NSAID's and/or analgesics, supervised PT with diminished ADL's post treatment, use of assistive devices, weight reduction as appropriate, and activity modification. The indications for the revision of the total knee arthroplasty are  subsidence of tibial component . Onset of symptoms was gradual starting  < 1  years ago with gradually worsening course since that time.  Prior procedures on the left knee(s) include arthroplasty.  Patient currently rates pain in the left knee(s) at 8 out of 10 with activity. There is night pain, worsening of pain with activity and weight bearing, pain that interferes with activities of daily living, and pain with passive range of motion.  Patient has evidence of  subsidence of the tibial plate  by imaging studies. This condition presents safety issues increasing the risk of falls. There is no current active infection.  Patient Active Problem List   Diagnosis Date Noted   Morbid obesity (HCC) 02/28/2020   Angina pectoris (HCC)    Exertional dyspnea    Primary localized osteoarthritis of left knee 02/27/2017   Cervical stenosis of spinal canal 07/23/2016   COPD exacerbation (HCC)    Centrilobular emphysema (HCC)    OSA (obstructive sleep apnea)    Other hyperlipidemia    Endotracheally intubated    Encounter for orogastric (OG) tube placement    Community acquired pneumonia of left lower lobe of lung 02/24/2016   Foraminal stenosis of lumbar region 12/04/2012   UTI (urinary tract infection) 11/30/2012   Acute lumbar back pain 11/28/2012   Left leg weakness 11/28/2012   HTN  (hypertension), benign 11/28/2012   Other and unspecified hyperlipidemia 11/28/2012   COPD (chronic obstructive pulmonary disease) (HCC) 11/28/2012   Dehydration 11/28/2012   Past Medical History:  Diagnosis Date   Abnormal stress test    Allergy    Arthritis    BACK   Chronic neck pain    Constipation    takes Sennokot daily   COPD (chronic obstructive pulmonary disease) (HCC)    Depression    PTSD-takes Zoloft  daily   Diabetes mellitus without complication (HCC)    diet controled   GERD (gastroesophageal reflux disease)    Glaucoma    mild in left eye   H/O hiatal hernia    History of blood transfusion    History of bronchitis    History of cardiac cath    a. normal cors by cath in 04/2018 following an abnormal NST.   History of colon polyps    benign   History of echocardiogram    Echocardiogram 2/22: EF 65-70, no RWMA, normal diastolic function, normal RVSF, mild LAE, inadequate TR jet (indeterminate PASP)   Hyperlipidemia    Hypertension    Morbid obesity (HCC)    Sleep apnea    USES CPAP   Weakness    numbness and tingling. Takes Gabapentin  daily    Past Surgical History:  Procedure Laterality Date   ANTERIOR CERVICAL DECOMP/DISCECTOMY FUSION N/A 07/23/2016   Procedure: CERVICAL THREE-FOUR, CERVICAL FOUR-FIVE, CERVICAL FIVE-SIX ANTERIOR CERVICAL DECOMPRESSION/DISCECTOMY FUSION;  Surgeon: Agustina Aldrich, MD;  Location: [facility] OR;  Service: Neurosurgery;  Laterality: N/A;   APPENDECTOMY  BACK SURGERY     x 4   BREAST REDUCTION SURGERY     CARPEL TUNNEL Bilateral    CATARACT EXTRACTION, BILATERAL  spring 2018   Dr. Consuela Denier   CHOLECYSTECTOMY     COLONOSCOPY     ESOPHAGOGASTRODUODENOSCOPY     LUMBAR LAMINECTOMY/DECOMPRESSION MICRODISCECTOMY Left 12/03/2012   Procedure: Left Lumbar Four to Five Diskectomy;  Surgeon: Adelbert Adler, MD;  Location: [facility];  Service: Neurosurgery;  Laterality: Left;  LUMBAR LAMINECTOMY/DECOMPRESSION MICRODISCECTOMY 1 LEVEL    RIGHT/LEFT HEART CATH AND CORONARY ANGIOGRAPHY N/A 05/12/2018   Procedure: RIGHT/LEFT HEART CATH AND CORONARY ANGIOGRAPHY;  Surgeon: Millicent Ally, MD;  Location: [facility];  Service: Cardiovascular;  Laterality: N/A;   TOTAL KNEE ARTHROPLASTY Right    TOTAL KNEE ARTHROPLASTY Left 02/27/2017   Procedure: LEFT TOTAL KNEE ARTHROPLASTY;  Surgeon: Marlena Sima, MD;  Location: [facility] OR;  Service: Orthopedics;  Laterality: Left;    Current Outpatient Medications  Medication Sig Dispense Refill Last Dose/Taking   albuterol  (PROVENTIL ) (2.5 MG/3ML) 0.083% nebulizer solution Take 3 mLs (2.5 mg total) by nebulization every 6 (six) hours as needed for wheezing or shortness of breath. 75 mL 12    albuterol  (VENTOLIN  HFA) 108 (90 Base) MCG/ACT inhaler Inhale 2 puffs into the lungs every 6 (six) hours as needed for wheezing or shortness of breath. 18 g 11    ARIPiprazole (ABILIFY) 2 MG tablet       aspirin  EC 81 MG tablet Take 81 mg by mouth daily.      atorvastatin  (LIPITOR) 20 MG tablet Take 20 mg by mouth daily.      calcium -vitamin D  (OSCAL WITH D) 500-200 MG-UNIT tablet Take 1 tablet by mouth daily with breakfast.      cetirizine (ZYRTEC) 10 MG tablet Take 10 mg by mouth daily.      eszopiclone (LUNESTA) 2 MG TABS tablet Take 2 mg by mouth at bedtime as needed for sleep. Take immediately before bedtime      Fluticasone-Umeclidin-Vilant (TRELEGY ELLIPTA ) 100-62.5-25 MCG/ACT AEPB Inhale 1 puff into the lungs daily. 1 each 5    furosemide  (LASIX ) 40 MG tablet Take 1 tablet (40 mg total) by mouth daily. 90 tablet 3    gabapentin  (NEURONTIN ) 300 MG capsule Take 900 mg by mouth 3 (three) times daily.       GLUCOSAMINE-CHONDROITIN PO Take 1 tablet by mouth at bedtime.       HYDROcodone -acetaminophen  (NORCO/VICODIN) 5-325 MG tablet Take 1 tablet by mouth 3 (three) times daily as needed for moderate pain (pain score 4-6).      losartan  (COZAAR ) 50 MG tablet TAKE ONE TABLET BY MOUTH DAILY. PLEASE  SCHEDULE APPOINTMENT FOR FUTHER REFILLS. 30 tablet 6    miconazole (MICOTIN) 2 % cream Apply 1 application topically 2 (two) times daily as needed (fungus).      montelukast  (SINGULAIR ) 10 MG tablet Take 10 mg by mouth daily.       Multiple Vitamin (MULTIVITAMIN WITH MINERALS) TABS tablet Take 1 tablet by mouth daily.      MYRBETRIQ 25 MG TB24 tablet Take 25 mg by mouth daily.      nitroGLYCERIN  (NITROSTAT ) 0.4 MG SL tablet Place 1 tablet (0.4 mg total) under the tongue every 5 (five) minutes as needed. 25 tablet 3    Omega-3 Fatty Acids  (FISH OIL PO) Take 1,500 mg by mouth daily.      omeprazole  (PRILOSEC) 20 MG capsule Take 40 mg by mouth daily before  breakfast.       sertraline  (ZOLOFT ) 100 MG tablet Take 100 mg by mouth 2 (two) times daily.       talc powder Apply 1 application topically as needed (moisture).      No current facility-administered medications for this visit.   Allergies  Allergen Reactions   Coconut Flavoring Agent (Non-Screening) Anaphylaxis    Anything with coconut in it   Cephalosporins Hives   Morphine And Codeine     dizziness   Sulfa Antibiotics Hives and Rash   Sulfasalazine Hives and Rash   Ketorolac      Headaches and dizziness   Cefuroxime Axetil Itching and Rash   Estrogens Rash    Social History   Tobacco Use   Smoking status: Former    Current packs/day: 0.00    Average packs/day: 2.0 packs/day for 40.0 years (80.0 ttl pk-yrs)    Types: Cigarettes    Start date: 05/17/1964    Quit date: 05/17/2004    Years since quitting: 19.2   Smokeless tobacco: Never  Substance Use Topics   Alcohol use: No    Family History  Problem Relation Age of Onset   Heart disease Mother    Stroke Mother    Emphysema Father    Pneumonia Father    Stroke Maternal Grandmother       Review of Systems  Musculoskeletal:  Positive for arthralgias.  All other systems reviewed and are negative.    Objective:  Physical Exam Constitutional:      General: She is not  in acute distress.    Appearance: Normal appearance. She is not ill-appearing.  HENT:     Head: Normocephalic and atraumatic.     Right Ear: External ear normal.     Left Ear: External ear normal.     Nose: Nose normal.     Mouth/Throat:     Mouth: Mucous membranes are moist.     Pharynx: Oropharynx is clear.  Eyes:     Extraocular Movements: Extraocular movements intact.     Conjunctiva/sclera: Conjunctivae normal.  Cardiovascular:     Rate and Rhythm: Normal rate and regular rhythm.     Pulses: Normal pulses.     Heart sounds: Normal heart sounds.  Pulmonary:     Effort: Pulmonary effort is normal.     Breath sounds: Normal breath sounds.  Abdominal:     General: Bowel sounds are normal.     Palpations: Abdomen is soft.     Tenderness: There is no abdominal tenderness.  Musculoskeletal:        General: Tenderness present.     Cervical back: Normal range of motion and neck supple.     Comments: TTP over medial and lateral joint line, medial worse than lateral.  No calf tenderness, swelling, or erythema.  Vertical well-healed incision over left anterior knee, otherwise no overlying lesions of area of chief complaint.  Decreased strength and ROM due to elicited pain.  Dorsiflexion and plantarflexion intact.  Stable to varus and valgus stress.  BLE appear grossly neurovascularly intact.  Gait mildly antalgic.   Skin:    General: Skin is warm and dry.  Neurological:     Mental Status: She is alert and oriented to person, place, and time. Mental status is at baseline.  Psychiatric:        Mood and Affect: Mood normal.        Behavior: Behavior normal.     Vital signs in last 24 hours: @VSRANGES @  Labs:  Estimated body mass index is 45.23 kg/m as calculated from the following:   Height as of 06/01/23: 4\' 9"  (1.448 m).   Weight as of 06/01/23: 94.8 kg.  Imaging Review Plain radiographs demonstrate subsidence of the lateral tibial component of the left knee(s). The overall  alignment is mild valgus. Again there is evidence of loosening of the tibial components. The bone quality appears to be fair for age and reported activity level.      Assessment/Plan:  Aseptic loosening with tibial component failure, left knee(s) with failed previous arthroplasty.   The patient history, physical examination, clinical judgment of the provider and imaging studies are consistent with aseptic loosening of the left knee(s), previous total knee arthroplasty. Revision total knee arthroplasty is deemed medically necessary. The treatment options including medical management, injection therapy, arthroscopy and revision arthroplasty were discussed at length. The risks and benefits of revision total knee arthroplasty were presented and reviewed. The risks due to aseptic loosening, infection, stiffness, patella tracking problems, thromboembolic complications and other imponderables were discussed. The patient acknowledged the explanation, agreed to proceed with the plan and consent was signed. Patient is being admitted for inpatient treatment for surgery, pain control, PT, OT, prophylactic antibiotics, VTE prophylaxis, progressive ambulation and ADL's and discharge planning.The patient is planning to be discharged home with HHPT ([facility]).

## 2023-08-10 NOTE — Progress Notes (Addendum)
 Anesthesia Review:  PCP:  Vallarie Gauze LVO 03/18/23 Clearance in Media Tab dated 05/26/23.  Cardiologist :  Nyle Belling Peck,NP LOV 05/25/23  Puln- DR Mauro Sox 06/01/23   PPM/ ICD: Device Orders: Rep Notified:  Chest x-ray : EKG : 05/25/2023  PFT- 2021  Echo : 05/26/23  Stress test: 2019  Cardiac Cath :  2020   Activity level:  can do a flight of stairs without difficutly  Sleep Study/ CPAP : has cpap  Fasting Blood Sugar :      / Checks Blood Sugar -- times a day:   DM- type2- diet conrolled does not check glucose at home   Hgba1c-  08/13/23-  On no meds   Blood Thinner/ Instructions /Last Dose: ASA / Instructions/ Last Dose :    81 mg aspirn    Husband with pt at preop appt.   PT has COPD.  AT preop appt pt in no respiratory distress.  O2sats-92.  PT states " That is where I normally run".  PT states I do have oxygen  at home if I need it.    Blood pressure  at preop in right arm was 98/73 and in left arm was 100/75.  PT denies any chest pain, shortness of breath,dizziness , headache or blurred vision.  Pulse 57. Sharlyn Deaner made aware.  PT instructed to monitor blood pressure  readings at home at least am and pm .  IF blood pressure stays around 100 systolic to notify cardiology .  PT and husband voiced understanding.    Glucose at 1035am was 71.  PT Asymptomatic.  PT given 6 pack of peanut butter crackers and apple juice .  AT 1105am glucose was 101.  PT states she ate full breakfast before coming to preop this am.  PT instructed to start eating a bedtime snack each nite prior to bedtime with protein such as cheese or peanut butter.  PT and husband voiced understanding.  Jessica Ward made aware of above.  No new orders given.  PT to leave preop and go eat lunch per pt and husband.    Called pt at home this pm on 08/13/2023  at 1707pm to check on status.  Pt states she was doing fine and blood pressure this pm was fine.

## 2023-08-10 NOTE — Patient Instructions (Addendum)
 SURGICAL WAITING ROOM VISITATION  Patients having surgery or a procedure may have no more than 2 support people in the waiting area - these visitors may rotate.    Children under the age of 62 must have an adult with them who is not the patient.  Due to an increase in RSV and influenza rates and associated hospitalizations, children ages 93 and under may not visit patients in [facility] hospitals.  Visitors with respiratory illnesses are discouraged from visiting and should remain at home.  If the patient needs to stay at the hospital during part of their recovery, the visitor guidelines for inpatient rooms apply. Pre-op nurse will coordinate an appropriate time for 1 support person to accompany patient in pre-op.  This support person may not rotate.    Please refer to the [facility] website for the visitor guidelines for Inpatients (after your surgery is over and you are in a regular room).       Your procedure is scheduled on:  08/24/2023    Report to [facility] Main Entrance    Report to admitting at   [phone]   Call this number if you have problems the morning of surgery [phone]   Do not eat food :After Midnight.   After Midnight you may have the following liquids until _ 0715_____ AM  DAY OF SURGERY  Water Non-Citrus Juices (without pulp, NO RED-Apple, White grape, White cranberry) Black Coffee (NO MILK/CREAM OR CREAMERS, sugar ok)  Clear Tea (NO MILK/CREAM OR CREAMERS, sugar ok) regular and decaf                             Plain Jell-O (NO RED)                                           Fruit ices (not with fruit pulp, NO RED)                                     Popsicles (NO RED)                                                               Sports drinks like Gatorade (NO RED)                    The day of surgery:  Drink ONE (1) Pre-Surgery Clear Ensure or G2 at  0715AM  ( have completed by ) the morning of surgery. Drink in one sitting. Do not sip.   This drink was given to you during your hospital  pre-op appointment visit. Nothing else to drink after completing the  Pre-Surgery Clear Ensure or G2.          If you have questions, please contact your surgeon's office.       Oral Hygiene is also important to reduce your risk of infection.  Remember - BRUSH YOUR TEETH THE MORNING OF SURGERY WITH YOUR REGULAR TOOTHPASTE  DENTURES WILL BE REMOVED PRIOR TO SURGERY PLEASE DO NOT APPLY "Poly grip" OR ADHESIVES!!!   Do NOT smoke after Midnight   Stop all vitamins and herbal supplements 7 days before surgery.   Take these medicines the morning of surgery with A SIP OF WATER:  nebulizer if needed, inhalers as usual and bring, abilify, sertraline , omeprazole  ,  gabapentin ,  , buspar , fluticasone , gabapentin ,  Montelukast ,  DO NOT TAKE ANY ORAL DIABETIC MEDICATIONS DAY OF YOUR SURGERY  Bring CPAP mask and tubing day of surgery.                              You may not have any metal on your body including hair pins, jewelry, and body piercing             Do not wear make-up, lotions, powders, perfumes/cologne, or deodorant  Do not wear nail polish including gel and S&S, artificial/acrylic nails, or any other type of covering on natural nails including finger and toenails. If you have artificial nails, gel coating, etc. that needs to be removed by a nail salon please have this removed prior to surgery or surgery may need to be canceled/ delayed if the surgeon/ anesthesia feels like they are unable to be safely monitored.   Do not shave  48 hours prior to surgery.               Men may shave face and neck.   Do not bring valuables to the hospital. [facility] IS NOT             RESPONSIBLE   FOR VALUABLES.   Contacts, glasses, dentures or bridgework may not be worn into surgery.   Bring small overnight bag day of surgery.   DO NOT BRING YOUR HOME MEDICATIONS TO THE HOSPITAL. PHARMACY WILL  DISPENSE MEDICATIONS LISTED ON YOUR MEDICATION LIST TO YOU DURING YOUR ADMISSION IN THE HOSPITAL!    Patients discharged on the day of surgery will not be allowed to drive home.  Someone NEEDS to stay with you for the first 24 hours after anesthesia.   Special Instructions: Bring a copy of your healthcare power of attorney and living will documents the day of surgery if you haven't scanned them before.              Please read over the following fact sheets you were given: IF YOU HAVE QUESTIONS ABOUT YOUR PRE-OP INSTRUCTIONS PLEASE CALL [phone]   If you received a COVID test during your pre-op visit  it is requested that you wear a mask when out in public, stay away from anyone that may not be feeling well and notify your surgeon if you develop symptoms. If you test positive for Covid or have been in contact with anyone that has tested positive in the last 10 days please notify you surgeon.      Pre-operative 5 CHG Bath Instructions   You can play a key role in reducing the risk of infection after surgery. Your skin needs to be as free of germs as possible. You can reduce the number of germs on your skin by washing with CHG (chlorhexidine  gluconate) soap before surgery. CHG is an antiseptic soap that kills germs and continues to kill germs even after washing.   DO NOT use if you have an allergy to chlorhexidine /CHG  or antibacterial soaps. If your skin becomes reddened or irritated, stop using the CHG and notify one of our RNs at [phone].   Please shower with the CHG soap starting 4 days before surgery using the following schedule:     Please keep in mind the following:  DO NOT shave, including legs and underarms, starting the day of your first shower.   You may shave your face at any point before/day of surgery.  Place clean sheets on your bed the day you start using CHG soap. Use a clean washcloth (not used since being washed) for each shower. DO NOT sleep with pets once you  start using the CHG.   CHG Shower Instructions:  If you choose to wash your hair and private area, wash first with your normal shampoo/soap.  After you use shampoo/soap, rinse your hair and body thoroughly to remove shampoo/soap residue.  Turn the water OFF and apply about 3 tablespoons (45 ml) of CHG soap to a CLEAN washcloth.  Apply CHG soap ONLY FROM YOUR NECK DOWN TO YOUR TOES (washing for 3-5 minutes)  DO NOT use CHG soap on face, private areas, open wounds, or sores.  Pay special attention to the area where your surgery is being performed.  If you are having back surgery, having someone wash your back for you may be helpful. Wait 2 minutes after CHG soap is applied, then you may rinse off the CHG soap.  Pat dry with a clean towel  Put on clean clothes/pajamas   If you choose to wear lotion, please use ONLY the CHG-compatible lotions on the back of this paper.     Additional instructions for the day of surgery: DO NOT APPLY any lotions, deodorants, cologne, or perfumes.   Put on clean/comfortable clothes.  Brush your teeth.  Ask your nurse before applying any prescription medications to the skin.      CHG Compatible Lotions   Aveeno Moisturizing lotion  Cetaphil Moisturizing Cream  Cetaphil Moisturizing Lotion  Clairol Herbal Essence Moisturizing Lotion, Dry Skin  Clairol Herbal Essence Moisturizing Lotion, Extra Dry Skin  Clairol Herbal Essence Moisturizing Lotion, Normal Skin  Curel Age Defying Therapeutic Moisturizing Lotion with Alpha Hydroxy  Curel Extreme Care Body Lotion  Curel Soothing Hands Moisturizing Hand Lotion  Curel Therapeutic Moisturizing Cream, Fragrance-Free  Curel Therapeutic Moisturizing Lotion, Fragrance-Free  Curel Therapeutic Moisturizing Lotion, Original Formula  Eucerin Daily Replenishing Lotion  Eucerin Dry Skin Therapy Plus Alpha Hydroxy Crme  Eucerin Dry Skin Therapy Plus Alpha Hydroxy Lotion  Eucerin Original Crme  Eucerin Original  Lotion  Eucerin Plus Crme Eucerin Plus Lotion  Eucerin TriLipid Replenishing Lotion  Keri Anti-Bacterial Hand Lotion  Keri Deep Conditioning Original Lotion Dry Skin Formula Softly Scented  Keri Deep Conditioning Original Lotion, Fragrance Free Sensitive Skin Formula  Keri Lotion Fast Absorbing Fragrance Free Sensitive Skin Formula  Keri Lotion Fast Absorbing Softly Scented Dry Skin Formula  Keri Original Lotion  Keri Skin Renewal Lotion Keri Silky Smooth Lotion  Keri Silky Smooth Sensitive Skin Lotion  Nivea Body Creamy Conditioning Oil  Nivea Body Extra Enriched Teacher, adult education Moisturizing Lotion Nivea Crme  Nivea Skin Firming Lotion  NutraDerm 30 Skin Lotion  NutraDerm Skin Lotion  NutraDerm Therapeutic Skin Cream  NutraDerm Therapeutic Skin Lotion  ProShield Protective Hand Cream  Provon moisturizing lotion

## 2023-08-13 ENCOUNTER — Other Ambulatory Visit: Payer: Self-pay

## 2023-08-13 ENCOUNTER — Encounter (HOSPITAL_COMMUNITY): Payer: Self-pay

## 2023-08-13 ENCOUNTER — Encounter (HOSPITAL_COMMUNITY)
Admission: RE | Admit: 2023-08-13 | Discharge: 2023-08-13 | Disposition: A | Discharge status: Home / Self Care | Source: Ambulatory Visit | Attending: Orthopedic Surgery | Admitting: Orthopedic Surgery

## 2023-08-13 VITALS — BP 100/75 | HR 57 | Temp 97.9°F | Resp 16 | Ht <= 58 in | Wt 194.0 lb

## 2023-08-13 DIAGNOSIS — Z01812 Encounter for preprocedural laboratory examination: Secondary | ICD-10-CM | POA: Insufficient documentation

## 2023-08-13 DIAGNOSIS — E119 Type 2 diabetes mellitus without complications: Secondary | ICD-10-CM | POA: Diagnosis not present

## 2023-08-13 DIAGNOSIS — Z96659 Presence of unspecified artificial knee joint: Secondary | ICD-10-CM | POA: Diagnosis not present

## 2023-08-13 DIAGNOSIS — Z87891 Personal history of nicotine dependence: Secondary | ICD-10-CM | POA: Diagnosis not present

## 2023-08-13 DIAGNOSIS — G4730 Sleep apnea, unspecified: Secondary | ICD-10-CM | POA: Insufficient documentation

## 2023-08-13 DIAGNOSIS — T84018A Broken internal joint prosthesis, other site, initial encounter: Secondary | ICD-10-CM | POA: Insufficient documentation

## 2023-08-13 DIAGNOSIS — I10 Essential (primary) hypertension: Secondary | ICD-10-CM | POA: Insufficient documentation

## 2023-08-13 DIAGNOSIS — M25562 Pain in left knee: Secondary | ICD-10-CM | POA: Diagnosis not present

## 2023-08-13 DIAGNOSIS — J449 Chronic obstructive pulmonary disease, unspecified: Secondary | ICD-10-CM | POA: Insufficient documentation

## 2023-08-13 DIAGNOSIS — Z01818 Encounter for other preprocedural examination: Secondary | ICD-10-CM

## 2023-08-13 DIAGNOSIS — G8929 Other chronic pain: Secondary | ICD-10-CM | POA: Insufficient documentation

## 2023-08-13 HISTORY — DX: Other specified postprocedural states: Z98.890

## 2023-08-13 HISTORY — DX: Dyspnea, unspecified: R06.00

## 2023-08-13 HISTORY — DX: Other specified postprocedural states: R11.2

## 2023-08-13 LAB — COMPREHENSIVE METABOLIC PANEL WITH GFR
ALT: 17 U/L (ref 0–44)
AST: 21 U/L (ref 15–41)
Albumin: 4.2 g/dL (ref 3.5–5.0)
Alkaline Phosphatase: 62 U/L (ref 38–126)
Anion gap: 10 (ref 5–15)
BUN: 21 mg/dL (ref 8–23)
CO2: 26 mmol/L (ref 22–32)
Calcium: 9.6 mg/dL (ref 8.9–10.3)
Chloride: 104 mmol/L (ref 98–111)
Creatinine, Ser: 1.21 mg/dL — ABNORMAL HIGH (ref 0.44–1.00)
GFR, Estimated: 47 mL/min — ABNORMAL LOW (ref >60)
Glucose, Bld: 73 mg/dL (ref 70–99)
Potassium: 4.1 mmol/L (ref 3.5–5.1)
Sodium: 140 mmol/L (ref 135–145)
Total Bilirubin: 0.6 mg/dL (ref 0.0–1.2)
Total Protein: 7.9 g/dL (ref 6.5–8.1)

## 2023-08-13 LAB — CBC WITH DIFFERENTIAL/PLATELET
Abs Immature Granulocytes: 0.03 K/uL (ref 0.00–0.07)
Basophils Absolute: 0 K/uL (ref 0.0–0.1)
Basophils Relative: 1 %
Eosinophils Absolute: 0.2 K/uL (ref 0.0–0.5)
Eosinophils Relative: 2 %
HCT: 38 % (ref 36.0–46.0)
Hemoglobin: 12.1 g/dL (ref 12.0–15.0)
Immature Granulocytes: 0 %
Lymphocytes Relative: 21 %
Lymphs Abs: 1.8 K/uL (ref 0.7–4.0)
MCH: 28.9 pg (ref 26.0–34.0)
MCHC: 31.8 g/dL (ref 30.0–36.0)
MCV: 90.9 fL (ref 80.0–100.0)
Monocytes Absolute: 0.5 K/uL (ref 0.1–1.0)
Monocytes Relative: 5 %
Neutro Abs: 5.8 K/uL (ref 1.7–7.7)
Neutrophils Relative %: 71 %
Platelets: 149 K/uL — ABNORMAL LOW (ref 150–400)
RBC: 4.18 MIL/uL (ref 3.87–5.11)
RDW: 12.8 % (ref 11.5–15.5)
WBC: 8.3 K/uL (ref 4.0–10.5)
nRBC: 0 % (ref 0.0–0.2)

## 2023-08-13 LAB — HEMOGLOBIN A1C
Hgb A1c MFr Bld: 5.7 % — ABNORMAL HIGH (ref 4.8–5.6)
Mean Plasma Glucose: 116.89 mg/dL

## 2023-08-13 LAB — TYPE AND SCREEN
ABO/RH(D): AB POS
Antibody Screen: NEGATIVE

## 2023-08-13 LAB — GLUCOSE, CAPILLARY
Glucose-Capillary: 101 mg/dL — ABNORMAL HIGH (ref 70–99)
Glucose-Capillary: 71 mg/dL (ref 70–99)

## 2023-08-14 LAB — SURGICAL PCR SCREEN
MRSA, PCR: POSITIVE — AB
Staphylococcus aureus: POSITIVE — AB

## 2023-08-14 NOTE — Progress Notes (Signed)
 Anesthesia Chart Review   Case: [id number] Date/Time: 08/24/23 1008   Procedures:      REVISION, TOTAL ARTHROPLASTY, KNEE (Left: Knee)     APPLICATION, WOUND VAC (Left)   Anesthesia type: Spinal   Pre-op diagnosis: ASEPTIC LOOSENTING, LEFT TOTAL KNEE PROSTHESIS   Location: [facility] ROOM 08 / [facility]   Surgeons: Murleen Arms, MD       DISCUSSION:72 y.o. former smoker smoker with h/o PONV, HTN, sleep apnea with cpap, COPD, DM II, loosening of left total knee prosthesis scheduled for above procedure 08/24/2023 with Dr. Priscille Brought.   Per pulmonology preoperative evaluation 06/01/2023, "Postoperative respiratory failure (PRF) is considered as failure to wean from mechanical ventilation within 48 hours of surgery or unplanned intubation/reintubation postoperatively. The validated risk calculator provides a risk estimate of PRF and is anticipated to aid in surgical decision-making and informed patient consent.  However risk can be accepted given the potential benefit of this intervention and it is not prohibitive.   The features of this patient's history that contribute to the pulmonary risk assessment include:  Age, COPD, General anesthesia.   0 to 25 points: Low risk: 1.6% pulmonary complication rate  26 to 44 points: Intermediate risk: 13.3% pulmonary complication rate  45 to 123 points: High risk: 42.1% pulmonary complication rate"  Per cardiology preoperative evaluation 05/25/2023, "Ms. Tomey's perioperative risk of a major cardiac event is 0.4% according to the Revised Cardiac Risk Index (RCRI).  Therefore, she is at low risk for perioperative complications.   Her functional capacity is fair at 4.64 METs according to the Duke Activity Status Index (DASI). Recommendations: The patient requires an echocardiogram before a disposition can be made regarding surgical risk.               Once Echo comes back WNL, will addendum note and route recs to requesting party.  Antiplatelet and/or  Anticoagulation Recommendations: Aspirin  can be held for 7 days prior to her surgery.  Please resume Aspirin  post operatively when it is felt to be safe from a bleeding standpoint.    Addendum 06/03/2023: Echocardiogram report is overall within normal limits.  She can proceed to her procedure.  See recommendations as noted above."   VS: BP 100/75   Pulse (!) 57   Temp 36.6 C (Oral)   Resp 16   Ht 4\' 9"  (1.448 m)   Wt 88 kg   SpO2 92%   BMI 41.98 kg/m   PROVIDERS: Burdine, Maceo Sax, MD is PCP   Cardiologist :  Ashok Laws, MD  LABS: Labs reviewed: Acceptable for surgery. (all labs ordered are listed, but only abnormal results are displayed)  Labs Reviewed  SURGICAL PCR SCREEN - Abnormal; Notable for the following components:      Result Value   MRSA, PCR POSITIVE (*)    Staphylococcus aureus POSITIVE (*)    All other components within normal limits  CBC WITH DIFFERENTIAL/PLATELET - Abnormal; Notable for the following components:   Platelets 149 (*)    All other components within normal limits  COMPREHENSIVE METABOLIC PANEL WITH GFR - Abnormal; Notable for the following components:   Creatinine, Ser 1.21 (*)    GFR, Estimated 47 (*)    All other components within normal limits  HEMOGLOBIN A1C - Abnormal; Notable for the following components:   Hgb A1c MFr Bld 5.7 (*)    All other components within normal limits  GLUCOSE, CAPILLARY - Abnormal; Notable for the following components:   Glucose-Capillary 101 (*)  All other components within normal limits  GLUCOSE, CAPILLARY  TYPE AND SCREEN     IMAGES:   EKG:   CV: Echo 05/26/23 1. Images are limited.   2. Left ventricular ejection fraction, by estimation, is 55 to 60%. The  left ventricle has normal function. Left ventricular endocardial border  not optimally defined to evaluate regional wall motion. Left ventricular  diastolic parameters are  indeterminate.   3. Right ventricular systolic function is  normal. The right ventricular  size is normal. There is normal pulmonary artery systolic pressure. The  estimated right ventricular systolic pressure is 26.6 mmHg.   4. Left atrial size was upper normal.   5. The mitral valve is grossly normal. Trivial mitral valve  regurgitation.   6. The aortic valve was not well visualized. Aortic valve regurgitation  is not visualized. Aortic valve mean gradient measures 5.0 mmHg.   7. The inferior vena cava is normal in size with greater than 50%  respiratory variability, suggesting right atrial pressure of 3 mmHg.  Past Medical History:  Diagnosis Date   Abnormal stress test    Allergy    Arthritis    BACK   Chronic neck pain    Constipation    takes Sennokot daily   COPD (chronic obstructive pulmonary disease) (HCC)    Depression    PTSD-takes Zoloft  daily   Diabetes mellitus without complication (HCC)    diet controled   Dyspnea    GERD (gastroesophageal reflux disease)    Glaucoma    mild in left eye   H/O hiatal hernia    History of blood transfusion    History of bronchitis    History of cardiac cath    a. normal cors by cath in 04/2018 following an abnormal NST.   History of colon polyps    benign   History of echocardiogram    Echocardiogram 2/22: EF 65-70, no RWMA, normal diastolic function, normal RVSF, mild LAE, inadequate TR jet (indeterminate PASP)   Hyperlipidemia    Hypertension    Morbid obesity (HCC)    PONV (postoperative nausea and vomiting)    Sleep apnea    USES CPAP   Weakness    numbness and tingling. Takes Gabapentin  daily    Past Surgical History:  Procedure Laterality Date   ANTERIOR CERVICAL DECOMP/DISCECTOMY FUSION N/A 07/23/2016   Procedure: CERVICAL THREE-FOUR, CERVICAL FOUR-FIVE, CERVICAL FIVE-SIX ANTERIOR CERVICAL DECOMPRESSION/DISCECTOMY FUSION;  Surgeon: Agustina Aldrich, MD;  Location: [facility];  Service: Neurosurgery;  Laterality: N/A;   APPENDECTOMY     BACK SURGERY     x 4   BREAST REDUCTION  SURGERY     CARPEL TUNNEL Bilateral    CATARACT EXTRACTION, BILATERAL  spring 2018   Dr. Consuela Denier   CHOLECYSTECTOMY     COLONOSCOPY     ESOPHAGOGASTRODUODENOSCOPY     LUMBAR LAMINECTOMY/DECOMPRESSION MICRODISCECTOMY Left 12/03/2012   Procedure: Left Lumbar Four to Five Diskectomy;  Surgeon: Adelbert Adler, MD;  Location: [facility];  Service: Neurosurgery;  Laterality: Left;  LUMBAR LAMINECTOMY/DECOMPRESSION MICRODISCECTOMY 1 LEVEL   RIGHT/LEFT HEART CATH AND CORONARY ANGIOGRAPHY N/A 05/12/2018   Procedure: RIGHT/LEFT HEART CATH AND CORONARY ANGIOGRAPHY;  Surgeon: Millicent Ally, MD;  Location: [facility] INVAS[facility];  Service: Cardiovascular;  Laterality: N/A;   TOTAL KNEE ARTHROPLASTY Right    TOTAL KNEE ARTHROPLASTY Left 02/27/2017   Procedure: LEFT TOTAL KNEE ARTHROPLASTY;  Surgeon: Marlena Sima, MD;  Location: [facility];  Service: Orthopedics;  Laterality: Left;  MEDICATIONS:  albuterol  (PROVENTIL ) (2.5 MG/3ML) 0.083% nebulizer solution   albuterol  (VENTOLIN  HFA) 108 (90 Base) MCG/ACT inhaler   ARIPiprazole (ABILIFY) 2 MG tablet   aspirin  EC 81 MG tablet   atorvastatin  (LIPITOR) 40 MG tablet   busPIRone (BUSPAR) 10 MG tablet   calcium -vitamin D  (OSCAL WITH D) 500-200 MG-UNIT tablet   cetirizine (ZYRTEC) 10 MG tablet   eszopiclone (LUNESTA) 2 MG TABS tablet   fluticasone (FLONASE) 50 MCG/ACT nasal spray   Fluticasone-Umeclidin-Vilant (TRELEGY ELLIPTA ) 100-62.5-25 MCG/ACT AEPB   furosemide  (LASIX ) 40 MG tablet   gabapentin  (NEURONTIN ) 300 MG capsule   GLUCOSAMINE-CHONDROITIN PO   HYDROcodone -acetaminophen  (NORCO/VICODIN) 5-325 MG tablet   lisinopril  (ZESTRIL ) 40 MG tablet   miconazole (MICOTIN) 2 % cream   montelukast  (SINGULAIR ) 10 MG tablet   Multiple Vitamin (MULTIVITAMIN WITH MINERALS) TABS tablet   nystatin cream (MYCOSTATIN)   Omega-3 Fatty Acids  (FISH OIL PO)   omeprazole  (PRILOSEC) 20 MG capsule   oxyBUTYnin Chloride 2.5 MG TABS   sertraline  (ZOLOFT ) 100 MG tablet    No current facility-administered medications for this encounter.     Chick Cotton Ward, PA-C [facility] [phone]

## 2023-08-14 NOTE — Anesthesia Preprocedure Evaluation (Addendum)
 Anesthesia Evaluation  Patient identified by MRN, date of birth, ID band Patient awake    Reviewed: Allergy & Precautions, NPO status , Patient's Chart, lab work & pertinent test results  History of Anesthesia Complications (+) PONV  Airway Mallampati: II  TM Distance: >3 FB Neck ROM: Full    Dental  (+) Dental Advisory Given   Pulmonary asthma , sleep apnea, Continuous Positive Airway Pressure Ventilation and Oxygen  sleep apnea , COPD,  COPD inhaler, former smoker   breath sounds clear to auscultation       Cardiovascular hypertension, Pt. on medications (-) CAD  Rhythm:Regular Rate:Normal  05/2023 ECHO: EF 55 to 60%.  1.The LV has normal function, endocardial border not optimally defined to evaluate regional wall motion.   3. RVF is normal. The right ventricular size is normal. There is normal pulmonary artery systolic pressure. The estimated right ventricular systolic pressure is 26.6 mmHg.  4. Left atrial size was upper normal.  5. The mitral valve is grossly normal. Trivial mitral valve regurgitation.  6. The aortic valve was not well visualized. Aortic valve regurgitation is not visualized. Aortic valve mean gradient measures 5.0 mmHg.  '20 cath: normal coronaries   Neuro/Psych    Depression    negative neurological ROS     GI/Hepatic Neg liver ROS, hiatal hernia,GERD  Controlled and Medicated,,  Endo/Other  diabetes (glu 99)    Renal/GU negative Renal ROS     Musculoskeletal   Abdominal   Peds  Hematology Hb 12.1, plt 149k   Anesthesia Other Findings   Reproductive/Obstetrics                              Anesthesia Physical Anesthesia Plan  ASA: 3  Anesthesia Plan: Spinal   Post-op Pain Management: Regional block* and Tylenol  PO (pre-op)*   Induction:   PONV Risk Score and Plan: 3 and Ondansetron  and Treatment may vary due to age or medical condition  Airway Management  Planned: Natural Airway and Simple Face Mask  Additional Equipment: None  Intra-op Plan:   Post-operative Plan:   Informed Consent: I have reviewed the patients History and Physical, chart, labs and discussed the procedure including the risks, benefits and alternatives for the proposed anesthesia with the patient or authorized representative who has indicated his/her understanding and acceptance.     Dental advisory given  Plan Discussed with: CRNA and Surgeon  Anesthesia Plan Comments: (See PAT note 08/13/2023)        Anesthesia Quick Evaluation

## 2023-08-18 DIAGNOSIS — Z1231 Encounter for screening mammogram for malignant neoplasm of breast: Secondary | ICD-10-CM | POA: Diagnosis not present

## 2023-08-24 ENCOUNTER — Encounter (HOSPITAL_COMMUNITY)
Admission: RE | Disposition: A | Discharge status: Home / Self Care | Payer: Self-pay | Source: Ambulatory Visit | Attending: Orthopedic Surgery

## 2023-08-24 ENCOUNTER — Encounter (HOSPITAL_COMMUNITY): Payer: Self-pay | Admitting: Orthopedic Surgery

## 2023-08-24 ENCOUNTER — Other Ambulatory Visit: Payer: Self-pay

## 2023-08-24 ENCOUNTER — Ambulatory Visit (HOSPITAL_COMMUNITY)
Admission: RE | Admit: 2023-08-24 | Discharge: 2023-08-24 | Disposition: A | Discharge status: Home / Self Care | Source: Ambulatory Visit | Attending: Orthopedic Surgery | Admitting: Orthopedic Surgery

## 2023-08-24 ENCOUNTER — Inpatient Hospital Stay (HOSPITAL_COMMUNITY): Payer: Self-pay | Admitting: Physician Assistant

## 2023-08-24 ENCOUNTER — Inpatient Hospital Stay (HOSPITAL_COMMUNITY): Admitting: Anesthesiology

## 2023-08-24 ENCOUNTER — Ambulatory Visit: Payer: Non-veteran care | Admitting: Nurse Practitioner

## 2023-08-24 DIAGNOSIS — X58XXXA Exposure to other specified factors, initial encounter: Secondary | ICD-10-CM | POA: Insufficient documentation

## 2023-08-24 DIAGNOSIS — T84033A Mechanical loosening of internal left knee prosthetic joint, initial encounter: Secondary | ICD-10-CM

## 2023-08-24 DIAGNOSIS — T84093A Other mechanical complication of internal left knee prosthesis, initial encounter: Secondary | ICD-10-CM | POA: Diagnosis not present

## 2023-08-24 DIAGNOSIS — Z882 Allergy status to sulfonamides status: Secondary | ICD-10-CM | POA: Diagnosis not present

## 2023-08-24 DIAGNOSIS — Z87891 Personal history of nicotine dependence: Secondary | ICD-10-CM | POA: Diagnosis not present

## 2023-08-24 DIAGNOSIS — K838 Other specified diseases of biliary tract: Secondary | ICD-10-CM | POA: Diagnosis not present

## 2023-08-24 DIAGNOSIS — J9611 Chronic respiratory failure with hypoxia: Secondary | ICD-10-CM | POA: Diagnosis not present

## 2023-08-24 DIAGNOSIS — G4733 Obstructive sleep apnea (adult) (pediatric): Secondary | ICD-10-CM | POA: Diagnosis not present

## 2023-08-24 DIAGNOSIS — Z538 Procedure and treatment not carried out for other reasons: Secondary | ICD-10-CM | POA: Insufficient documentation

## 2023-08-24 DIAGNOSIS — Z01818 Encounter for other preprocedural examination: Principal | ICD-10-CM

## 2023-08-24 DIAGNOSIS — I10 Essential (primary) hypertension: Secondary | ICD-10-CM

## 2023-08-24 DIAGNOSIS — Z79899 Other long term (current) drug therapy: Secondary | ICD-10-CM | POA: Insufficient documentation

## 2023-08-24 DIAGNOSIS — E860 Dehydration: Secondary | ICD-10-CM | POA: Diagnosis not present

## 2023-08-24 DIAGNOSIS — N179 Acute kidney failure, unspecified: Secondary | ICD-10-CM | POA: Diagnosis not present

## 2023-08-24 DIAGNOSIS — Z825 Family history of asthma and other chronic lower respiratory diseases: Secondary | ICD-10-CM | POA: Diagnosis not present

## 2023-08-24 DIAGNOSIS — K219 Gastro-esophageal reflux disease without esophagitis: Secondary | ICD-10-CM | POA: Diagnosis not present

## 2023-08-24 DIAGNOSIS — Z8249 Family history of ischemic heart disease and other diseases of the circulatory system: Secondary | ICD-10-CM | POA: Diagnosis not present

## 2023-08-24 DIAGNOSIS — J432 Centrilobular emphysema: Secondary | ICD-10-CM | POA: Diagnosis not present

## 2023-08-24 DIAGNOSIS — E119 Type 2 diabetes mellitus without complications: Secondary | ICD-10-CM

## 2023-08-24 DIAGNOSIS — F32A Depression, unspecified: Secondary | ICD-10-CM | POA: Insufficient documentation

## 2023-08-24 DIAGNOSIS — D62 Acute posthemorrhagic anemia: Secondary | ICD-10-CM | POA: Diagnosis not present

## 2023-08-24 DIAGNOSIS — Z7982 Long term (current) use of aspirin: Secondary | ICD-10-CM | POA: Diagnosis not present

## 2023-08-24 DIAGNOSIS — Z881 Allergy status to other antibiotic agents status: Secondary | ICD-10-CM | POA: Diagnosis not present

## 2023-08-24 DIAGNOSIS — J449 Chronic obstructive pulmonary disease, unspecified: Secondary | ICD-10-CM | POA: Diagnosis not present

## 2023-08-24 DIAGNOSIS — G9341 Metabolic encephalopathy: Secondary | ICD-10-CM | POA: Diagnosis not present

## 2023-08-24 DIAGNOSIS — K449 Diaphragmatic hernia without obstruction or gangrene: Secondary | ICD-10-CM | POA: Insufficient documentation

## 2023-08-24 DIAGNOSIS — J45909 Unspecified asthma, uncomplicated: Secondary | ICD-10-CM | POA: Insufficient documentation

## 2023-08-24 LAB — GLUCOSE, CAPILLARY: Glucose-Capillary: 99 mg/dL (ref 70–99)

## 2023-08-24 SURGERY — REVISION, TOTAL ARTHROPLASTY, KNEE
Anesthesia: Spinal | Site: Knee | Laterality: Left

## 2023-08-24 MED ORDER — PHENYLEPHRINE HCL-NACL 20-0.9 MG/250ML-% IV SOLN
INTRAVENOUS | Status: AC
  Filled 2023-08-24: qty 250

## 2023-08-24 MED ORDER — ACETAMINOPHEN 500 MG PO TABS
1000.0000 mg | ORAL_TABLET | Freq: Once | ORAL | Status: AC
  Administered 2023-08-24: 1000 mg via ORAL
  Filled 2023-08-24: qty 2

## 2023-08-24 MED ORDER — SODIUM CHLORIDE (PF) 0.9 % IJ SOLN
INTRAMUSCULAR | Status: AC
  Filled 2023-08-24: qty 30

## 2023-08-24 MED ORDER — INSULIN ASPART 100 UNIT/ML IJ SOLN: 0.0000 [IU] | INTRAMUSCULAR | Status: DC | PRN

## 2023-08-24 MED ORDER — BUPIVACAINE-EPINEPHRINE (PF) 0.25% -1:200000 IJ SOLN
INTRAMUSCULAR | Status: AC
  Filled 2023-08-24: qty 30

## 2023-08-24 MED ORDER — BUPIVACAINE LIPOSOME 1.3 % IJ SUSP
INTRAMUSCULAR | Status: AC
  Filled 2023-08-24: qty 20

## 2023-08-24 MED ORDER — DEXAMETHASONE SODIUM PHOSPHATE 10 MG/ML IJ SOLN: 4.0000 mg | Freq: Once | INTRAMUSCULAR | Status: DC

## 2023-08-24 MED ORDER — FENTANYL CITRATE PF 50 MCG/ML IJ SOSY
PREFILLED_SYRINGE | INTRAMUSCULAR | Status: AC
  Administered 2023-08-24: 50 ug via INTRAVENOUS
  Filled 2023-08-24: qty 1

## 2023-08-24 MED ORDER — CHLORHEXIDINE GLUCONATE 0.12 % MT SOLN
15.0000 mL | Freq: Once | OROMUCOSAL | Status: AC
  Administered 2023-08-24: 15 mL via OROMUCOSAL

## 2023-08-24 MED ORDER — MIDAZOLAM HCL 2 MG/2ML IJ SOLN: 1.0000 mg | Freq: Once | INTRAMUSCULAR | Status: AC

## 2023-08-24 MED ORDER — MUPIROCIN 2 % EX OINT: 1.0000 | TOPICAL_OINTMENT | Freq: Two times a day (BID) | CUTANEOUS | 0 refills | Status: AC

## 2023-08-24 MED ORDER — CHLORHEXIDINE GLUCONATE 4 % EX SOLN: 1.0000 | CUTANEOUS | 1 refills | Status: AC

## 2023-08-24 MED ORDER — TRANEXAMIC ACID-NACL 1000-0.7 MG/100ML-% IV SOLN
1000.0000 mg | INTRAVENOUS | Status: DC
  Filled 2023-08-24: qty 100

## 2023-08-24 MED ORDER — ACETAMINOPHEN 500 MG PO TABS
1000.0000 mg | ORAL_TABLET | Freq: Once | ORAL | Status: DC
Start: 2023-08-24 — End: 2023-08-24

## 2023-08-24 MED ORDER — CLONIDINE HCL (ANALGESIA) 100 MCG/ML EP SOLN
EPIDURAL | Status: DC | PRN
Start: 2023-08-24 — End: 2023-08-24
  Administered 2023-08-24: 50 ug

## 2023-08-24 MED ORDER — ROPIVACAINE HCL 7.5 MG/ML IJ SOLN
INTRAMUSCULAR | Status: DC | PRN
Start: 2023-08-24 — End: 2023-08-24
  Administered 2023-08-24: 20 mL via PERINEURAL

## 2023-08-24 MED ORDER — VANCOMYCIN HCL 1000 MG IV SOLR
INTRAVENOUS | Status: AC
  Filled 2023-08-24: qty 20

## 2023-08-24 MED ORDER — ORAL CARE MOUTH RINSE: 15.0000 mL | Freq: Once | OROMUCOSAL | Status: AC

## 2023-08-24 MED ORDER — BUPIVACAINE LIPOSOME 1.3 % IJ SUSP: 20.0000 mL | Freq: Once | INTRAMUSCULAR | Status: DC

## 2023-08-24 MED ORDER — LEVOFLOXACIN IN D5W 500 MG/100ML IV SOLN
500.0000 mg | INTRAVENOUS | Status: DC
Start: 2023-08-24 — End: 2023-08-24
  Filled 2023-08-24: qty 100

## 2023-08-24 MED ORDER — SODIUM CHLORIDE 0.9 % IV SOLN: INTRAVENOUS | Status: DC

## 2023-08-24 MED ORDER — FENTANYL CITRATE PF 50 MCG/ML IJ SOSY: 50.0000 ug | PREFILLED_SYRINGE | Freq: Once | INTRAMUSCULAR | Status: AC

## 2023-08-24 MED ORDER — LACTATED RINGERS IV SOLN: INTRAVENOUS | Status: DC

## 2023-08-24 MED ORDER — MIDAZOLAM HCL 2 MG/2ML IJ SOLN
INTRAMUSCULAR | Status: AC
Start: 2023-08-24 — End: 2023-08-24
  Administered 2023-08-24: 1 mg via INTRAVENOUS
  Filled 2023-08-24: qty 2

## 2023-08-24 MED ORDER — POVIDONE-IODINE 10 % EX SWAB: 2.0000 | Freq: Once | CUTANEOUS | Status: DC

## 2023-08-24 MED ORDER — ISOPROPYL ALCOHOL 70 % SOLN
Status: AC
  Filled 2023-08-24: qty 480

## 2023-08-24 MED ORDER — VANCOMYCIN HCL IN DEXTROSE 1-5 GM/200ML-% IV SOLN
1000.0000 mg | INTRAVENOUS | Status: AC
  Administered 2023-08-24: 1000 mg via INTRAVENOUS
  Filled 2023-08-24: qty 200

## 2023-08-24 NOTE — Addendum Note (Signed)
 Addendum  created 08/24/23 1319 by Jonnie Nettles, CRNA   Intraprocedure Event edited, Intraprocedure Staff edited

## 2023-08-24 NOTE — Progress Notes (Signed)
 Cement was found on retractors just prior to anesthesia. Unfortunately, all trays have to be re-sterilized. Given that this is a revision case, no backup trays immediately available. Will plan to cancel surgery today and reschedule for this Wednesday.

## 2023-08-24 NOTE — OR Nursing (Signed)
 Case cancelled per Dr. Rozena Cornish. Contamination of instruments. Marlyce Sine RN

## 2023-08-24 NOTE — Interval H&P Note (Signed)
 The patient has been re-examined, and the chart reviewed, and there have been no interval changes to the documented history and physical.    Plan for left knee revision for left total knee aseptic loosening  The operative side was examined and the patient was confirmed to have sensation to DPN, SPN, TN intact, Motor EHL, ext, flex 5/5, and DP 2+, PT 2+, No significant edema. Well healed prior incison, no erythema   The risks, benefits, and alternatives have been discussed at length with patient, and the patient is willing to proceed.  Left knee marked. Consent has been signed.

## 2023-08-24 NOTE — Progress Notes (Signed)
 Patient brought back to short stay bay 17 because surgery was cancelled.  Patient and daughter made aware by Dr Pryor Browning.  Plan to try to reschedule patient for Wednesday this week.  Patient verbalized understanding.  Iv removed, patient discharged with daughter home

## 2023-08-24 NOTE — Anesthesia Procedure Notes (Signed)
 Anesthesia Regional Block: Adductor canal block   Pre-Anesthetic Checklist: , timeout performed,  Correct Patient, Correct Site, Correct Laterality,  Correct Procedure, Correct Position, site marked,  Risks and benefits discussed,  Surgical consent,  Pre-op evaluation,  At surgeon's request and post-op pain management  Laterality: Left and Lower  Prep: chloraprep       Needles:  Injection technique: Single-shot  Needle Type: Echogenic Needle     Needle Length: 9cm  Needle Gauge: 21     Additional Needles:   Procedures:,,,, ultrasound used (permanent image in chart),,    Narrative:  Start time: 08/24/2023 10:41 AM End time: 08/24/2023 10:47 AM Injection made incrementally with aspirations every 5 mL.  Performed by: Personally  Anesthesiologist: Jonne Netters, MD  Additional Notes: Pt identified in Holding room.  Monitors applied. Working IV access confirmed. Timeout, sterile prep L thigh.  #21ga ECHOgenic Arrow block needle into adductor canal with US  guidance.  20cc 0.75% Ropivacaine  with 50 mcg Clonidine injected incrementally after negative test dose.  Patient asymptomatic, VSS, no heme aspirated, tolerated well.   Fay Hoop, MD

## 2023-08-24 NOTE — Discharge Instructions (Signed)

## 2023-08-24 NOTE — Anesthesia Postprocedure Evaluation (Signed)
 Anesthesia Post Note  Patient: Lynn Dixon  Procedure(s) Performed: REVISION, TOTAL ARTHROPLASTY, KNEE (Left: Knee) APPLICATION, WOUND VAC (Left)     Patient location during evaluation: Short Stay Anesthesia plan: none, case cancelled before SAB placed. Pain management: pain level controlled Vital Signs Assessment: post-procedure vital signs reviewed and stable Respiratory status: spontaneous breathing, nonlabored ventilation, respiratory function stable and patient connected to nasal cannula oxygen  Cardiovascular status: blood pressure returned to baseline and stable Postop Assessment: no apparent nausea or vomiting Anesthetic complications: no   Case cancelled in the OR, prior to SAB placement, because of instrument issues  Last Vitals:  Vitals:   08/24/23 1100 08/24/23 1101  BP: 103/62 103/62  Pulse: (!) 43 (!) 43  Resp: 14 (!) 9  Temp:    SpO2: 95% (!) 88%    Last Pain:  Vitals:   08/24/23 0917  TempSrc:   PainSc: 0-No pain                 Grant Swager,E. Mack Thurmon

## 2023-08-25 ENCOUNTER — Encounter (HOSPITAL_COMMUNITY): Payer: Self-pay

## 2023-08-25 NOTE — Anesthesia Preprocedure Evaluation (Addendum)
 Anesthesia Evaluation  Patient identified by MRN, date of birth, ID band Patient awake    Reviewed: Allergy & Precautions, NPO status , Patient's Chart, lab work & pertinent test results  History of Anesthesia Complications (+) PONV and history of anesthetic complications  Airway Mallampati: IV  TM Distance: >3 FB Neck ROM: Full    Dental  (+) Partial Upper, Dental Advisory Given   Pulmonary sleep apnea (3LPM at night through cpap), Continuous Positive Airway Pressure Ventilation and Oxygen  sleep apnea , COPD, former smoker 80 pack year history    Pulmonary exam normal breath sounds clear to auscultation       Cardiovascular hypertension (145/97 preop), Pt. on medications pulmonary hypertension (mild pHTN on cath)Normal cardiovascular exam Rhythm:Regular Rate:Normal  Echo 05/2023 1. Images are limited.   2. Left ventricular ejection fraction, by estimation, is 55 to 60%. The  left ventricle has normal function. Left ventricular endocardial border  not optimally defined to evaluate regional wall motion. Left ventricular  diastolic parameters are  indeterminate.   3. Right ventricular systolic function is normal. The right ventricular  size is normal. There is normal pulmonary artery systolic pressure. The  estimated right ventricular systolic pressure is 26.6 mmHg.   4. Left atrial size was upper normal.   5. The mitral valve is grossly normal. Trivial mitral valve  regurgitation.   6. The aortic valve was not well visualized. Aortic valve regurgitation  is not visualized. Aortic valve mean gradient measures 5.0 mmHg.   7. The inferior vena cava is normal in size with greater than 50%  respiratory variability, suggesting right atrial pressure of 3 mmHg.    Cath 2020 Mild right heart pressure elevation with mild pulmonary hypertension.  Normal LV function without focal segmental wall motion abnormalities; LVEDP 21 mmHg Normal  coronary arteries.     Neuro/Psych  PSYCHIATRIC DISORDERS  Depression       GI/Hepatic Neg liver ROS, hiatal hernia,GERD  Controlled and Medicated,,  Endo/Other  diabetes, Well Controlled, Type 2  Class 3 obesityBMI 42  Renal/GU Renal InsufficiencyRenal diseaseCr 1.21  negative genitourinary   Musculoskeletal  (+) Arthritis , Osteoarthritis,    Abdominal  (+) + obese  Peds  Hematology negative hematology ROS (+) Hb 12.1, plt 149   Anesthesia Other Findings   Reproductive/Obstetrics negative OB ROS                             Anesthesia Physical Anesthesia Plan  ASA: 3  Anesthesia Plan: Spinal, Regional and MAC   Post-op Pain Management: Tylenol  PO (pre-op)*   Induction:   PONV Risk Score and Plan: 2 and Propofol  infusion and TIVA  Airway Management Planned: Natural Airway and Nasal Cannula  Additional Equipment: None  Intra-op Plan:   Post-operative Plan:   Informed Consent: I have reviewed the patients History and Physical, chart, labs and discussed the procedure including the risks, benefits and alternatives for the proposed anesthesia with the patient or authorized representative who has indicated his/her understanding and acceptance.       Plan Discussed with: CRNA  Anesthesia Plan Comments: (TKR in 2018 under spinal, no issues.)       Anesthesia Quick Evaluation

## 2023-08-25 NOTE — Patient Instructions (Signed)
 SURGICAL WAITING ROOM VISITATION  Patients having surgery or a procedure may have no more than 2 support people in the waiting area - these visitors may rotate.    Children under the age of 85 must have an adult with them who is not the patient.  Visitors with respiratory illnesses are discouraged from visiting and should remain at home.  If the patient needs to stay at the hospital during part of their recovery, the visitor guidelines for inpatient rooms apply. Pre-op nurse will coordinate an appropriate time for 1 support person to accompany patient in pre-op.  This support person may not rotate.    Please refer to the [facility] website for the visitor guidelines for Inpatients (after your surgery is over and you are in a regular room).      Your procedure is scheduled on:  08-26-23   Report to [facility] Main Entrance    Report to admitting at   10:20 AM   Call this number if you have problems the morning of surgery [phone]   Do not eat food :After Midnight.   After Midnight you may have the following liquids until 9:50  AM  DAY OF SURGERY  Water Non-Citrus Juices (without pulp, NO RED-Apple, White grape, White cranberry) Black Coffee (NO MILK/CREAM OR CREAMERS, sugar ok)  Clear Tea (NO MILK/CREAM OR CREAMERS, sugar ok) regular and decaf                             Plain Jell-O (NO RED)                                           Fruit ices (not with fruit pulp, NO RED)                                     Popsicles (NO RED)                                                               Sports drinks like Gatorade (NO RED)                        If you have questions, please contact your surgeon's office.   Oral Hygiene is also important to reduce your risk of infection.                                    Remember - BRUSH YOUR TEETH THE MORNING OF SURGERY WITH YOUR REGULAR TOOTHPASTE  DENTURES WILL BE REMOVED PRIOR TO SURGERY PLEASE DO NOT APPLY "Poly grip" OR  ADHESIVES!!!   Do NOT smoke after Midnight   Stop all vitamins and herbal supplements 7 days before surgery.   Take these medicines the morning of surgery with A SIP OF WATER:   Nebulizer if needed, inhalers as usual and bring Abilify Sertraline  Omeprazole  Gabapentin  Buspar  Fluticasone  Montelukast    DO NOT TAKE ANY ORAL DIABETIC MEDICATIONS DAY OF  YOUR SURGERY  Bring CPAP mask and tubing day of surgery.                              You may not have any metal on your body including hair pins, jewelry, and body piercing             Do not wear make-up, lotions, powders, perfumes, or deodorant  Do not wear nail polish including gel and S&S, artificial/acrylic nails, or any other type of covering on natural nails including finger and toenails. If you have artificial nails, gel coating, etc. that needs to be removed by a nail salon please have this removed prior to surgery or surgery may need to be canceled/ delayed if the surgeon/ anesthesia feels like they are unable to be safely monitored.   Do not shave  48 hours prior to surgery.    Do not bring valuables to the hospital. [facility] IS NOT             RESPONSIBLE   FOR VALUABLES.   Contacts, glasses, dentures or bridgework may not be worn into surgery.   Bring small overnight bag day of surgery.   DO NOT BRING YOUR HOME MEDICATIONS TO THE HOSPITAL. PHARMACY WILL DISPENSE MEDICATIONS LISTED ON YOUR MEDICATION LIST TO YOU DURING YOUR ADMISSION IN THE HOSPITAL!     Special Instructions: Bring a copy of your healthcare power of attorney and living will documents the day of surgery if you haven't scanned them before.              Please read over the following fact sheets you were given: IF YOU HAVE QUESTIONS ABOUT YOUR PRE-OP INSTRUCTIONS PLEASE CALL [phone]   If you received a COVID test during your pre-op visit  it is requested that you wear a mask when out in public, stay away from anyone that may not be feeling well  and notify your surgeon if you develop symptoms. If you test positive for Covid or have been in contact with anyone that has tested positive in the last 10 days please notify you surgeon.   - Preparing for Surgery Before surgery, you can play an important role.  Because skin is not sterile, your skin needs to be as free of germs as possible.  You can reduce the number of germs on your skin by washing with CHG (chlorahexidine gluconate) soap before surgery.  CHG is an antiseptic cleaner which kills germs and bonds with the skin to continue killing germs even after washing. Please DO NOT use if you have an allergy to CHG or antibacterial soaps.  If your skin becomes reddened/irritated stop using the CHG and inform your nurse when you arrive at Short Stay. Do not shave (including legs and underarms) for at least 48 hours prior to the first CHG shower.  You may shave your face/neck.  Please follow these instructions carefully:  1.  Shower with CHG Soap the night before surgery and the  morning of surgery.  2.  If you choose to wash your hair, wash your hair first as usual with your normal  shampoo.  3.  After you shampoo, rinse your hair and body thoroughly to remove the shampoo.                             4.  Use CHG as you would any other  liquid soap.  You can apply chg directly to the skin and wash.  Gently with a scrungie or clean washcloth.  5.  Apply the CHG Soap to your body ONLY FROM THE NECK DOWN.   Do   not use on face/ open                           Wound or open sores. Avoid contact with eyes, ears mouth and   genitals (private parts).                       Wash face,  Genitals (private parts) with your normal soap.             6.  Wash thoroughly, paying special attention to the area where your    surgery  will be performed.  7.  Thoroughly rinse your body with warm water from the neck down.  8.  DO NOT shower/wash with your normal soap after using and rinsing off the CHG  Soap.                9.  Pat yourself dry with a clean towel.            10.  Wear clean pajamas.            11.  Place clean sheets on your bed the night of your first shower and do not  sleep with pets. Day of Surgery : Do not apply any lotions/deodorants the morning of surgery.  Please wear clean clothes to the hospital/surgery center.  FAILURE TO FOLLOW THESE INSTRUCTIONS MAY RESULT IN THE CANCELLATION OF YOUR SURGERY  PATIENT SIGNATURE_________________________________  NURSE SIGNATURE__________________________________  ________________________________________________________________________

## 2023-08-25 NOTE — Progress Notes (Addendum)
 Patient phoned to give updated information on surgery.  Surgery rescheduled to 08-26-23  Date of Surgery - 08-26-23  Arrival Time - 10:20 and check in at admitting.    NPO Status - patient reminded to not eat solid food after midnight.  From midnight until 9:50 AM may have clear liquids  Medications morning of surgery - Aripiprazole, Atorvastatin , Buspirone, Zyrtec, Gabapentin , Omeprazole , Sertraline , Oxybutynin  No change in medical history, allergies per patient.  Transportation home - Patient will be admitted after surgery   All questions answered and patient stated understanding

## 2023-08-25 NOTE — Progress Notes (Signed)
 Surgery orders requested via Epic inbox.

## 2023-08-26 ENCOUNTER — Inpatient Hospital Stay (HOSPITAL_COMMUNITY)
Admission: RE | Admit: 2023-08-26 | Discharge: 2023-09-02 | DRG: 466 | Disposition: A | Discharge status: Skilled Nursing Facility | Attending: Orthopedic Surgery | Admitting: Orthopedic Surgery

## 2023-08-26 ENCOUNTER — Other Ambulatory Visit: Payer: Self-pay

## 2023-08-26 ENCOUNTER — Inpatient Hospital Stay (HOSPITAL_COMMUNITY): Admitting: Anesthesiology

## 2023-08-26 ENCOUNTER — Encounter (HOSPITAL_COMMUNITY): Payer: Self-pay | Admitting: Orthopedic Surgery

## 2023-08-26 ENCOUNTER — Ambulatory Visit: Payer: Self-pay | Admitting: Emergency Medicine

## 2023-08-26 ENCOUNTER — Inpatient Hospital Stay (HOSPITAL_COMMUNITY)

## 2023-08-26 ENCOUNTER — Encounter (HOSPITAL_COMMUNITY)
Admission: RE | Disposition: A | Discharge status: Skilled Nursing Facility | Payer: Self-pay | Source: Home / Self Care | Attending: Orthopedic Surgery

## 2023-08-26 DIAGNOSIS — D62 Acute posthemorrhagic anemia: Secondary | ICD-10-CM | POA: Diagnosis not present

## 2023-08-26 DIAGNOSIS — M6281 Muscle weakness (generalized): Secondary | ICD-10-CM | POA: Diagnosis not present

## 2023-08-26 DIAGNOSIS — E785 Hyperlipidemia, unspecified: Secondary | ICD-10-CM | POA: Diagnosis not present

## 2023-08-26 DIAGNOSIS — R0902 Hypoxemia: Secondary | ICD-10-CM | POA: Diagnosis not present

## 2023-08-26 DIAGNOSIS — T84033A Mechanical loosening of internal left knee prosthetic joint, initial encounter: Principal | ICD-10-CM | POA: Diagnosis present

## 2023-08-26 DIAGNOSIS — Z96652 Presence of left artificial knee joint: Secondary | ICD-10-CM | POA: Diagnosis not present

## 2023-08-26 DIAGNOSIS — Z87891 Personal history of nicotine dependence: Secondary | ICD-10-CM | POA: Diagnosis not present

## 2023-08-26 DIAGNOSIS — Z882 Allergy status to sulfonamides status: Secondary | ICD-10-CM

## 2023-08-26 DIAGNOSIS — I209 Angina pectoris, unspecified: Secondary | ICD-10-CM | POA: Diagnosis not present

## 2023-08-26 DIAGNOSIS — R338 Other retention of urine: Secondary | ICD-10-CM

## 2023-08-26 DIAGNOSIS — R161 Splenomegaly, not elsewhere classified: Secondary | ICD-10-CM | POA: Diagnosis not present

## 2023-08-26 DIAGNOSIS — Z5309 Procedure and treatment not carried out because of other contraindication: Secondary | ICD-10-CM | POA: Diagnosis present

## 2023-08-26 DIAGNOSIS — E119 Type 2 diabetes mellitus without complications: Principal | ICD-10-CM | POA: Diagnosis present

## 2023-08-26 DIAGNOSIS — Z7982 Long term (current) use of aspirin: Secondary | ICD-10-CM | POA: Diagnosis not present

## 2023-08-26 DIAGNOSIS — T84018D Broken internal joint prosthesis, other site, subsequent encounter: Secondary | ICD-10-CM | POA: Diagnosis not present

## 2023-08-26 DIAGNOSIS — I10 Essential (primary) hypertension: Secondary | ICD-10-CM | POA: Diagnosis present

## 2023-08-26 DIAGNOSIS — Z8249 Family history of ischemic heart disease and other diseases of the circulatory system: Secondary | ICD-10-CM | POA: Diagnosis not present

## 2023-08-26 DIAGNOSIS — Z79899 Other long term (current) drug therapy: Secondary | ICD-10-CM

## 2023-08-26 DIAGNOSIS — J432 Centrilobular emphysema: Secondary | ICD-10-CM | POA: Diagnosis present

## 2023-08-26 DIAGNOSIS — Z743 Need for continuous supervision: Secondary | ICD-10-CM | POA: Diagnosis not present

## 2023-08-26 DIAGNOSIS — F4310 Post-traumatic stress disorder, unspecified: Secondary | ICD-10-CM | POA: Diagnosis present

## 2023-08-26 DIAGNOSIS — Z885 Allergy status to narcotic agent status: Secondary | ICD-10-CM

## 2023-08-26 DIAGNOSIS — Z6841 Body Mass Index (BMI) 40.0 and over, adult: Secondary | ICD-10-CM

## 2023-08-26 DIAGNOSIS — T84018A Broken internal joint prosthesis, other site, initial encounter: Secondary | ICD-10-CM

## 2023-08-26 DIAGNOSIS — Z978 Presence of other specified devices: Secondary | ICD-10-CM | POA: Diagnosis not present

## 2023-08-26 DIAGNOSIS — Z823 Family history of stroke: Secondary | ICD-10-CM

## 2023-08-26 DIAGNOSIS — G4700 Insomnia, unspecified: Secondary | ICD-10-CM | POA: Diagnosis not present

## 2023-08-26 DIAGNOSIS — M1712 Unilateral primary osteoarthritis, left knee: Secondary | ICD-10-CM | POA: Diagnosis not present

## 2023-08-26 DIAGNOSIS — J449 Chronic obstructive pulmonary disease, unspecified: Secondary | ICD-10-CM

## 2023-08-26 DIAGNOSIS — T84093D Other mechanical complication of internal left knee prosthesis, subsequent encounter: Secondary | ICD-10-CM | POA: Diagnosis not present

## 2023-08-26 DIAGNOSIS — G4733 Obstructive sleep apnea (adult) (pediatric): Secondary | ICD-10-CM | POA: Diagnosis present

## 2023-08-26 DIAGNOSIS — R7989 Other specified abnormal findings of blood chemistry: Secondary | ICD-10-CM

## 2023-08-26 DIAGNOSIS — Y792 Prosthetic and other implants, materials and accessory orthopedic devices associated with adverse incidents: Secondary | ICD-10-CM | POA: Diagnosis present

## 2023-08-26 DIAGNOSIS — F32A Depression, unspecified: Secondary | ICD-10-CM | POA: Diagnosis present

## 2023-08-26 DIAGNOSIS — F419 Anxiety disorder, unspecified: Secondary | ICD-10-CM | POA: Diagnosis present

## 2023-08-26 DIAGNOSIS — R2689 Other abnormalities of gait and mobility: Secondary | ICD-10-CM | POA: Diagnosis not present

## 2023-08-26 DIAGNOSIS — T84093A Other mechanical complication of internal left knee prosthesis, initial encounter: Secondary | ICD-10-CM

## 2023-08-26 DIAGNOSIS — N179 Acute kidney failure, unspecified: Secondary | ICD-10-CM | POA: Diagnosis not present

## 2023-08-26 DIAGNOSIS — M5432 Sciatica, left side: Secondary | ICD-10-CM | POA: Diagnosis not present

## 2023-08-26 DIAGNOSIS — Z5189 Encounter for other specified aftercare: Secondary | ICD-10-CM | POA: Diagnosis not present

## 2023-08-26 DIAGNOSIS — R339 Retention of urine, unspecified: Secondary | ICD-10-CM | POA: Diagnosis not present

## 2023-08-26 DIAGNOSIS — R410 Disorientation, unspecified: Secondary | ICD-10-CM | POA: Diagnosis not present

## 2023-08-26 DIAGNOSIS — E66813 Obesity, class 3: Secondary | ICD-10-CM | POA: Diagnosis present

## 2023-08-26 DIAGNOSIS — Z91018 Allergy to other foods: Secondary | ICD-10-CM

## 2023-08-26 DIAGNOSIS — K219 Gastro-esophageal reflux disease without esophagitis: Secondary | ICD-10-CM | POA: Diagnosis not present

## 2023-08-26 DIAGNOSIS — Z881 Allergy status to other antibiotic agents status: Secondary | ICD-10-CM

## 2023-08-26 DIAGNOSIS — R0609 Other forms of dyspnea: Secondary | ICD-10-CM | POA: Diagnosis not present

## 2023-08-26 DIAGNOSIS — Z825 Family history of asthma and other chronic lower respiratory diseases: Secondary | ICD-10-CM

## 2023-08-26 DIAGNOSIS — E7849 Other hyperlipidemia: Secondary | ICD-10-CM | POA: Diagnosis not present

## 2023-08-26 DIAGNOSIS — J9611 Chronic respiratory failure with hypoxia: Secondary | ICD-10-CM | POA: Diagnosis present

## 2023-08-26 DIAGNOSIS — R7401 Elevation of levels of liver transaminase levels: Secondary | ICD-10-CM | POA: Diagnosis not present

## 2023-08-26 DIAGNOSIS — G8918 Other acute postprocedural pain: Secondary | ICD-10-CM | POA: Diagnosis not present

## 2023-08-26 DIAGNOSIS — E860 Dehydration: Secondary | ICD-10-CM | POA: Diagnosis not present

## 2023-08-26 DIAGNOSIS — K838 Other specified diseases of biliary tract: Secondary | ICD-10-CM | POA: Diagnosis present

## 2023-08-26 DIAGNOSIS — G9341 Metabolic encephalopathy: Secondary | ICD-10-CM | POA: Diagnosis not present

## 2023-08-26 DIAGNOSIS — H4052X3 Glaucoma secondary to other eye disorders, left eye, severe stage: Secondary | ICD-10-CM | POA: Diagnosis not present

## 2023-08-26 DIAGNOSIS — Z7401 Bed confinement status: Secondary | ICD-10-CM | POA: Diagnosis not present

## 2023-08-26 DIAGNOSIS — Z96659 Presence of unspecified artificial knee joint: Secondary | ICD-10-CM | POA: Diagnosis not present

## 2023-08-26 DIAGNOSIS — Z860100 Personal history of colon polyps, unspecified: Secondary | ICD-10-CM

## 2023-08-26 DIAGNOSIS — R609 Edema, unspecified: Secondary | ICD-10-CM | POA: Diagnosis not present

## 2023-08-26 DIAGNOSIS — Z888 Allergy status to other drugs, medicaments and biological substances status: Secondary | ICD-10-CM

## 2023-08-26 DIAGNOSIS — I517 Cardiomegaly: Secondary | ICD-10-CM | POA: Diagnosis not present

## 2023-08-26 DIAGNOSIS — R531 Weakness: Secondary | ICD-10-CM | POA: Diagnosis not present

## 2023-08-26 DIAGNOSIS — R278 Other lack of coordination: Secondary | ICD-10-CM | POA: Diagnosis not present

## 2023-08-26 DIAGNOSIS — Z471 Aftercare following joint replacement surgery: Secondary | ICD-10-CM | POA: Diagnosis not present

## 2023-08-26 DIAGNOSIS — I700 Atherosclerosis of aorta: Secondary | ICD-10-CM | POA: Diagnosis not present

## 2023-08-26 DIAGNOSIS — M25462 Effusion, left knee: Secondary | ICD-10-CM | POA: Diagnosis not present

## 2023-08-26 DIAGNOSIS — Z7951 Long term (current) use of inhaled steroids: Secondary | ICD-10-CM

## 2023-08-26 DIAGNOSIS — H409 Unspecified glaucoma: Secondary | ICD-10-CM | POA: Diagnosis present

## 2023-08-26 HISTORY — PX: TOTAL KNEE REVISION: SHX996

## 2023-08-26 HISTORY — PX: APPLICATION OF WOUND VAC: SHX5189

## 2023-08-26 SURGERY — TOTAL KNEE REVISION
Anesthesia: Monitor Anesthesia Care | Site: Knee | Laterality: Left

## 2023-08-26 MED ORDER — PANTOPRAZOLE SODIUM 40 MG PO TBEC
40.0000 mg | DELAYED_RELEASE_TABLET | Freq: Every day | ORAL | Status: DC
  Administered 2023-08-27 – 2023-09-02 (×7): 40 mg via ORAL
  Filled 2023-08-26 (×8): qty 1

## 2023-08-26 MED ORDER — ROPIVACAINE HCL 5 MG/ML IJ SOLN
INTRAMUSCULAR | Status: DC | PRN
  Administered 2023-08-26: 30 mL via PERINEURAL

## 2023-08-26 MED ORDER — CIPROFLOXACIN IN D5W 400 MG/200ML IV SOLN
400.0000 mg | Freq: Two times a day (BID) | INTRAVENOUS | Status: AC
  Administered 2023-08-27 – 2023-08-29 (×5): 400 mg via INTRAVENOUS
  Filled 2023-08-26 (×6): qty 200

## 2023-08-26 MED ORDER — PHENOL 1.4 % MT LIQD: 1.0000 | OROMUCOSAL | Status: DC | PRN

## 2023-08-26 MED ORDER — WATER FOR IRRIGATION, STERILE IR SOLN
Status: DC | PRN
  Administered 2023-08-26: 1000 mL

## 2023-08-26 MED ORDER — MIDAZOLAM HCL 2 MG/2ML IJ SOLN: 0.5000 mg | Freq: Once | INTRAMUSCULAR | Status: DC | PRN

## 2023-08-26 MED ORDER — DEXAMETHASONE SODIUM PHOSPHATE 10 MG/ML IJ SOLN
4.0000 mg | Freq: Once | INTRAMUSCULAR | Status: AC
  Administered 2023-08-26: 8 mg via INTRAVENOUS

## 2023-08-26 MED ORDER — ASPIRIN EC 81 MG PO TBEC: 81.0000 mg | DELAYED_RELEASE_TABLET | Freq: Two times a day (BID) | ORAL | 11 refills | Status: AC

## 2023-08-26 MED ORDER — PROPOFOL 10 MG/ML IV BOLUS
INTRAVENOUS | Status: DC | PRN
  Administered 2023-08-26: 30 mg via INTRAVENOUS

## 2023-08-26 MED ORDER — EPHEDRINE 5 MG/ML INJ
INTRAVENOUS | Status: AC
  Filled 2023-08-26: qty 5

## 2023-08-26 MED ORDER — ONDANSETRON HCL 4 MG PO TABS: 4.0000 mg | ORAL_TABLET | Freq: Four times a day (QID) | ORAL | Status: DC | PRN

## 2023-08-26 MED ORDER — METHOCARBAMOL 500 MG PO TABS
500.0000 mg | ORAL_TABLET | Freq: Four times a day (QID) | ORAL | Status: DC | PRN
  Administered 2023-08-26 – 2023-09-02 (×13): 500 mg via ORAL
  Filled 2023-08-26 (×14): qty 1

## 2023-08-26 MED ORDER — FENTANYL CITRATE PF 50 MCG/ML IJ SOSY
100.0000 ug | PREFILLED_SYRINGE | INTRAMUSCULAR | Status: AC
  Administered 2023-08-26: 50 ug via INTRAVENOUS
  Filled 2023-08-26: qty 2

## 2023-08-26 MED ORDER — MUPIROCIN 2 % EX OINT: 1.0000 | TOPICAL_OINTMENT | Freq: Two times a day (BID) | CUTANEOUS | 0 refills | Status: DC

## 2023-08-26 MED ORDER — BUPIVACAINE-EPINEPHRINE (PF) 0.25% -1:200000 IJ SOLN
INTRAMUSCULAR | Status: AC
  Filled 2023-08-26: qty 30

## 2023-08-26 MED ORDER — ASPIRIN 81 MG PO CHEW
81.0000 mg | CHEWABLE_TABLET | Freq: Two times a day (BID) | ORAL | Status: DC
  Administered 2023-08-26 – 2023-09-02 (×14): 81 mg via ORAL
  Filled 2023-08-26 (×14): qty 1

## 2023-08-26 MED ORDER — LEVOFLOXACIN IN D5W 500 MG/100ML IV SOLN
500.0000 mg | INTRAVENOUS | Status: AC
  Administered 2023-08-26: 500 mg via INTRAVENOUS
  Filled 2023-08-26: qty 100

## 2023-08-26 MED ORDER — OXYCODONE HCL 5 MG PO TABS: 5.0000 mg | ORAL_TABLET | ORAL | 0 refills | Status: DC | PRN

## 2023-08-26 MED ORDER — ONDANSETRON HCL 4 MG PO TABS: 4.0000 mg | ORAL_TABLET | Freq: Three times a day (TID) | ORAL | 0 refills | Status: DC | PRN

## 2023-08-26 MED ORDER — ACETAMINOPHEN 325 MG PO TABS
325.0000 mg | ORAL_TABLET | Freq: Four times a day (QID) | ORAL | Status: DC | PRN
  Administered 2023-08-28 – 2023-09-02 (×9): 650 mg via ORAL
  Filled 2023-08-26 (×10): qty 2

## 2023-08-26 MED ORDER — POVIDONE-IODINE 10 % EX SWAB: 2.0000 | Freq: Once | CUTANEOUS | Status: DC

## 2023-08-26 MED ORDER — LACTATED RINGERS IV SOLN: INTRAVENOUS | Status: DC | PRN

## 2023-08-26 MED ORDER — ISOPROPYL ALCOHOL 70 % SOLN
Status: DC | PRN
  Administered 2023-08-26: 1 via TOPICAL

## 2023-08-26 MED ORDER — 0.9 % SODIUM CHLORIDE (POUR BTL) OPTIME
TOPICAL | Status: DC | PRN
Start: 2023-08-26 — End: 2023-08-26
  Administered 2023-08-26: 1000 mL

## 2023-08-26 MED ORDER — ACETAMINOPHEN 500 MG PO TABS
1000.0000 mg | ORAL_TABLET | Freq: Four times a day (QID) | ORAL | Status: AC
  Administered 2023-08-26 – 2023-08-27 (×4): 1000 mg via ORAL
  Filled 2023-08-26 (×4): qty 2

## 2023-08-26 MED ORDER — CHLORHEXIDINE GLUCONATE 0.12 % MT SOLN
15.0000 mL | Freq: Once | OROMUCOSAL | Status: AC
  Administered 2023-08-26: 15 mL via OROMUCOSAL

## 2023-08-26 MED ORDER — OXYCODONE HCL 5 MG PO TABS: 5.0000 mg | ORAL_TABLET | Freq: Once | ORAL | Status: DC | PRN

## 2023-08-26 MED ORDER — SODIUM CHLORIDE 0.9 % IR SOLN
Status: DC | PRN
  Administered 2023-08-26: 3000 mL

## 2023-08-26 MED ORDER — SODIUM CHLORIDE (PF) 0.9 % IJ SOLN
INTRAMUSCULAR | Status: DC | PRN
  Administered 2023-08-26: 80 mL

## 2023-08-26 MED ORDER — BUPIVACAINE IN DEXTROSE 0.75-8.25 % IT SOLN
INTRATHECAL | Status: DC | PRN
  Administered 2023-08-26: 2 mL via INTRATHECAL

## 2023-08-26 MED ORDER — PROPOFOL 500 MG/50ML IV EMUL
INTRAVENOUS | Status: DC | PRN
  Administered 2023-08-26: 150 ug/kg/min via INTRAVENOUS

## 2023-08-26 MED ORDER — POLYETHYLENE GLYCOL 3350 17 G PO PACK: 17.0000 g | PACK | Freq: Every day | ORAL | 0 refills | Status: DC

## 2023-08-26 MED ORDER — CHLORHEXIDINE GLUCONATE 4 % EX SOLN
60.0000 mL | Freq: Once | CUTANEOUS | Status: DC
Start: 2023-08-26 — End: 2023-08-26

## 2023-08-26 MED ORDER — SODIUM CHLORIDE (PF) 0.9 % IJ SOLN
INTRAMUSCULAR | Status: AC
  Filled 2023-08-26: qty 30

## 2023-08-26 MED ORDER — DEXAMETHASONE SODIUM PHOSPHATE 10 MG/ML IJ SOLN
INTRAMUSCULAR | Status: DC | PRN
  Administered 2023-08-26: 10 mg

## 2023-08-26 MED ORDER — HYDROMORPHONE HCL 1 MG/ML IJ SOLN
0.5000 mg | INTRAMUSCULAR | Status: DC | PRN
  Administered 2023-08-27 – 2023-08-28 (×2): 1 mg via INTRAVENOUS
  Filled 2023-08-26 (×2): qty 1

## 2023-08-26 MED ORDER — ACETAMINOPHEN 500 MG PO TABS
1000.0000 mg | ORAL_TABLET | Freq: Once | ORAL | Status: AC
  Administered 2023-08-26: 1000 mg via ORAL
  Filled 2023-08-26: qty 2

## 2023-08-26 MED ORDER — METHOCARBAMOL 1000 MG/10ML IJ SOLN: 500.0000 mg | Freq: Four times a day (QID) | INTRAMUSCULAR | Status: DC | PRN

## 2023-08-26 MED ORDER — LACTATED RINGERS IV SOLN: INTRAVENOUS | Status: DC

## 2023-08-26 MED ORDER — ACETAMINOPHEN 500 MG PO TABS: 1000.0000 mg | ORAL_TABLET | Freq: Three times a day (TID) | ORAL | Status: AC | PRN

## 2023-08-26 MED ORDER — VANCOMYCIN HCL IN DEXTROSE 1-5 GM/200ML-% IV SOLN
1000.0000 mg | INTRAVENOUS | Status: AC
  Administered 2023-08-26: 1000 mg via INTRAVENOUS
  Filled 2023-08-26: qty 200

## 2023-08-26 MED ORDER — PHENYLEPHRINE HCL-NACL 20-0.9 MG/250ML-% IV SOLN
INTRAVENOUS | Status: DC | PRN
  Administered 2023-08-26: 50 ug/min via INTRAVENOUS

## 2023-08-26 MED ORDER — OXYCODONE HCL 5 MG PO TABS
5.0000 mg | ORAL_TABLET | ORAL | Status: DC | PRN
  Administered 2023-08-26 (×2): 5 mg via ORAL
  Administered 2023-08-27 (×2): 10 mg via ORAL
  Administered 2023-08-27: 5 mg via ORAL
  Administered 2023-08-28 (×5): 10 mg via ORAL
  Administered 2023-08-29: 5 mg via ORAL
  Administered 2023-08-29: 10 mg via ORAL
  Administered 2023-08-30 – 2023-09-01 (×3): 5 mg via ORAL
  Filled 2023-08-26 (×3): qty 2
  Filled 2023-08-26 (×5): qty 1
  Filled 2023-08-26 (×2): qty 2
  Filled 2023-08-26: qty 1
  Filled 2023-08-26: qty 2
  Filled 2023-08-26: qty 1
  Filled 2023-08-26 (×2): qty 2
  Filled 2023-08-26: qty 1
  Filled 2023-08-26: qty 2

## 2023-08-26 MED ORDER — DEXAMETHASONE SODIUM PHOSPHATE 10 MG/ML IJ SOLN
INTRAMUSCULAR | Status: AC
  Filled 2023-08-26: qty 1

## 2023-08-26 MED ORDER — DIPHENHYDRAMINE HCL 12.5 MG/5ML PO ELIX: 12.5000 mg | ORAL_SOLUTION | ORAL | Status: DC | PRN

## 2023-08-26 MED ORDER — BUPIVACAINE LIPOSOME 1.3 % IJ SUSP
INTRAMUSCULAR | Status: AC
  Filled 2023-08-26: qty 20

## 2023-08-26 MED ORDER — DOXYCYCLINE HYCLATE 100 MG PO TABS: 100.0000 mg | ORAL_TABLET | Freq: Two times a day (BID) | ORAL | 0 refills | Status: DC

## 2023-08-26 MED ORDER — DOCUSATE SODIUM 100 MG PO CAPS
100.0000 mg | ORAL_CAPSULE | Freq: Two times a day (BID) | ORAL | Status: DC
  Administered 2023-08-26 – 2023-09-02 (×14): 100 mg via ORAL
  Filled 2023-08-26 (×14): qty 1

## 2023-08-26 MED ORDER — POLYETHYLENE GLYCOL 3350 17 G PO PACK: 17.0000 g | PACK | Freq: Every day | ORAL | Status: DC | PRN

## 2023-08-26 MED ORDER — VANCOMYCIN HCL IN DEXTROSE 1-5 GM/200ML-% IV SOLN
1000.0000 mg | Freq: Two times a day (BID) | INTRAVENOUS | Status: DC
  Filled 2023-08-26: qty 200

## 2023-08-26 MED ORDER — OXYCODONE HCL 5 MG/5ML PO SOLN: 5.0000 mg | Freq: Once | ORAL | Status: DC | PRN

## 2023-08-26 MED ORDER — ORAL CARE MOUTH RINSE: 15.0000 mL | Freq: Once | OROMUCOSAL | Status: AC

## 2023-08-26 MED ORDER — ONDANSETRON HCL 4 MG/2ML IJ SOLN
INTRAMUSCULAR | Status: DC | PRN
  Administered 2023-08-26: 4 mg via INTRAVENOUS

## 2023-08-26 MED ORDER — AMISULPRIDE (ANTIEMETIC) 5 MG/2ML IV SOLN: 10.0000 mg | Freq: Once | INTRAVENOUS | Status: DC | PRN

## 2023-08-26 MED ORDER — HYDROMORPHONE HCL 1 MG/ML IJ SOLN: 0.2500 mg | INTRAMUSCULAR | Status: DC | PRN

## 2023-08-26 MED ORDER — VANCOMYCIN HCL IN DEXTROSE 1-5 GM/200ML-% IV SOLN: 1000.0000 mg | INTRAVENOUS | Status: DC

## 2023-08-26 MED ORDER — ONDANSETRON HCL 4 MG/2ML IJ SOLN: 4.0000 mg | Freq: Once | INTRAMUSCULAR | Status: DC | PRN

## 2023-08-26 MED ORDER — METHOCARBAMOL 500 MG PO TABS: 500.0000 mg | ORAL_TABLET | Freq: Three times a day (TID) | ORAL | 0 refills | Status: DC | PRN

## 2023-08-26 MED ORDER — MIDAZOLAM HCL 2 MG/2ML IJ SOLN
2.0000 mg | INTRAMUSCULAR | Status: AC
  Administered 2023-08-26: 1 mg via INTRAVENOUS
  Filled 2023-08-26: qty 2

## 2023-08-26 MED ORDER — CHLORHEXIDINE GLUCONATE 4 % EX SOLN: 1.0000 | CUTANEOUS | 1 refills | Status: DC

## 2023-08-26 MED ORDER — MENTHOL 3 MG MT LOZG: 1.0000 | LOZENGE | OROMUCOSAL | Status: DC | PRN

## 2023-08-26 MED ORDER — TRANEXAMIC ACID-NACL 1000-0.7 MG/100ML-% IV SOLN
1000.0000 mg | INTRAVENOUS | Status: AC
  Administered 2023-08-26: 1000 mg via INTRAVENOUS

## 2023-08-26 MED ORDER — PROPOFOL 1000 MG/100ML IV EMUL
INTRAVENOUS | Status: AC
  Filled 2023-08-26: qty 100

## 2023-08-26 MED ORDER — ZOLPIDEM TARTRATE 5 MG PO TABS
5.0000 mg | ORAL_TABLET | Freq: Every evening | ORAL | Status: DC | PRN
  Administered 2023-08-27: 5 mg via ORAL
  Filled 2023-08-26: qty 1

## 2023-08-26 MED ORDER — ONDANSETRON HCL 4 MG/2ML IJ SOLN
INTRAMUSCULAR | Status: AC
  Filled 2023-08-26: qty 2

## 2023-08-26 MED ORDER — ONDANSETRON HCL 4 MG/2ML IJ SOLN: 4.0000 mg | Freq: Four times a day (QID) | INTRAMUSCULAR | Status: DC | PRN

## 2023-08-26 MED ORDER — PROPOFOL 1000 MG/100ML IV EMUL
INTRAVENOUS | Status: AC
  Filled 2023-08-26: qty 200

## 2023-08-26 SURGICAL SUPPLY — 65 items
AUG FEM PS DIST KNEE 3 10 (Joint) IMPLANT
AUG FEM PS DIST KNEE 3 5 (Joint) IMPLANT
AUG TIB HALF BLOCK DC LM 10 (Joint) IMPLANT
AUGMENT TIB HALF BLC CD LL 5 (Joint) IMPLANT
BAG COUNTER SPONGE SURGICOUNT (BAG) IMPLANT
BLADE OSTEOTOME FLAT 10X5 (BLADE) IMPLANT
BLADE SAG 18X100X1.27 (BLADE) ×2 IMPLANT
BLADE SAW SAG 35X64 .89 (BLADE) ×2 IMPLANT
BLADE SAW SGTL 81X20 HD (BLADE) IMPLANT
BNDG COHESIVE 4X5 TAN STRL LF (GAUZE/BANDAGES/DRESSINGS) ×2 IMPLANT
BNDG ELASTIC 6X10 VLCR STRL LF (GAUZE/BANDAGES/DRESSINGS) ×2 IMPLANT
BOWL SMART MIX CTS (DISPOSABLE) IMPLANT
CANISTER WOUND CARE 500ML ATS (WOUND CARE) ×2 IMPLANT
CEMENT BONE REFOBACIN R1X40 US (Cement) IMPLANT
CHLORAPREP W/TINT 26 (MISCELLANEOUS) ×4 IMPLANT
CNTNR URN SCR LID CUP LEK RST (MISCELLANEOUS) IMPLANT
COMPONENT FEM CMT PERS 3+ LT (Joint) IMPLANT
COVER SURGICAL LIGHT HANDLE (MISCELLANEOUS) ×2 IMPLANT
CUFF TRNQT CYL 34X4.125X (TOURNIQUET CUFF) ×2 IMPLANT
DRAPE INCISE IOBAN 85X60 (DRAPES) ×2 IMPLANT
DRAPE SHEET LG 3/4 BI-LAMINATE (DRAPES) ×2 IMPLANT
DRAPE U-SHAPE 47X51 STRL (DRAPES) ×2 IMPLANT
DRESSING PEEL AND PLAC PRVNA20 (GAUZE/BANDAGES/DRESSINGS) ×2 IMPLANT
GAUZE SPONGE 4X4 12PLY STRL (GAUZE/BANDAGES/DRESSINGS) ×2 IMPLANT
GLOVE BIO SURGEON STRL SZ 6.5 (GLOVE) ×4 IMPLANT
GLOVE BIOGEL PI IND STRL 6.5 (GLOVE) ×2 IMPLANT
GLOVE BIOGEL PI IND STRL 8 (GLOVE) ×2 IMPLANT
GLOVE SURG ORTHO 8.0 STRL STRW (GLOVE) ×4 IMPLANT
GOWN STRL REUS W/ TWL XL LVL3 (GOWN DISPOSABLE) ×4 IMPLANT
HOLDER FOLEY CATH W/STRAP (MISCELLANEOUS) IMPLANT
HOOD PEEL AWAY T7 (MISCELLANEOUS) ×6 IMPLANT
IMMOBILIZER KNEE 20 (SOFTGOODS) ×1 IMPLANT
IMMOBILIZER KNEE 20 THIGH 36 (SOFTGOODS) IMPLANT
INSERT TIB PS CMT KEEL SZ DL (Insert) IMPLANT
INSERT TIB PS CMTLS CC FX (Insert) IMPLANT
INSTRUMENT SCRW HEX REV 3.5X48 (ORTHOPEDIC DISPOSABLE SUPPLIES) IMPLANT
KIT DRSG PREVENA PLUS 7DAY 125 (MISCELLANEOUS) ×2 IMPLANT
KIT TURNOVER KIT A (KITS) IMPLANT
LINER TIB PS CD/3-5 12 LT (Liner) IMPLANT
MANIFOLD NEPTUNE II (INSTRUMENTS) ×2 IMPLANT
MARKER SKIN DUAL TIP RULER LAB (MISCELLANEOUS) ×2 IMPLANT
NS IRRIG 1000ML POUR BTL (IV SOLUTION) ×2 IMPLANT
PACK TOTAL KNEE CUSTOM (KITS) ×2 IMPLANT
PENCIL SMOKE EVACUATOR (MISCELLANEOUS) ×2 IMPLANT
PIN DRILL HDLS TROCAR 75 4PK (PIN) IMPLANT
PROTECTOR NERVE ULNAR (MISCELLANEOUS) ×2 IMPLANT
SCREW HEX HEADED 3.5X27 DISP (ORTHOPEDIC DISPOSABLE SUPPLIES) IMPLANT
SET HNDPC FAN SPRY TIP SCT (DISPOSABLE) ×2 IMPLANT
SOLUTION IRRIG SURGIPHOR (IV SOLUTION) IMPLANT
SOLUTION PRONTOSAN WOUND 350ML (IRRIGATION / IRRIGATOR) IMPLANT
SPIKE FLUID TRANSFER (MISCELLANEOUS) ×2 IMPLANT
STEM EXT FEM 135L 12D 6 (Stem) IMPLANT
STEM FEM KNEE OFFSET 135X12X3 (Stem) IMPLANT
SUT ETHILON 3 0 PS 1 (SUTURE) ×8 IMPLANT
SUT NYLON 3 0 (SUTURE) IMPLANT
SUT STRATAFIX 14 PDO 48 VLT (SUTURE) ×2 IMPLANT
SUT STRATAFIX PDO 1 14 VIOLET (SUTURE) ×1 IMPLANT
SUT VIC AB 0 CT1 36 (SUTURE) ×2 IMPLANT
SUT VIC AB 2-0 CT2 27 (SUTURE) ×4 IMPLANT
SUTURE STRATFX 0 PDS 27 VIOLET (SUTURE) ×2 IMPLANT
SYR 50ML LL SCALE MARK (SYRINGE) ×2 IMPLANT
TRAY FOLEY MTR SLVR 16FR STAT (SET/KITS/TRAYS/PACK) ×2 IMPLANT
TUBE SUCTION HIGH CAP CLEAR NV (SUCTIONS) ×2 IMPLANT
UNDERPAD 30X36 HEAVY ABSORB (UNDERPADS AND DIAPERS) ×2 IMPLANT
WRAP KNEE MAXI GEL POST OP (GAUZE/BANDAGES/DRESSINGS) IMPLANT

## 2023-08-26 NOTE — Plan of Care (Signed)
   Problem: Education: Goal: Knowledge of General Education information will improve Description: Including pain rating scale, medication(s)/side effects and non-pharmacologic comfort measures Outcome: Progressing   Problem: Activity: Goal: Risk for activity intolerance will decrease Outcome: Progressing   Problem: Pain Managment: Goal: General experience of comfort will improve and/or be controlled Outcome: Progressing

## 2023-08-26 NOTE — Progress Notes (Signed)
 Pharmacy Antibiotic Note  Lynn Dixon is a 73 y.o. female admitted on 08/26/2023 with Total knee revision. Pharmacy has been consulted for vancomycin  and ciprofloxacin  dosing for surgical prophylaxis.   Spoke with ortho PA - planning for IV antibiotics for 72 hours post-op. Pt received levofloxacin  500 mg IV and vancomycin  1000 mg IV pre-op today.   Today, 08/26/23 Using SCr 1.21 from 08/13/23 pre-op visit for dose calculations. CrCl ~40 mL/min  Plan: BMP with AM labs tomorrow Ciprofloxacin  400 mg IV q12h  Vancomycin  1000 mg IV q48 hours for estimated AUC of 454, using Vd 0.5  Monitor renal function. Antibiotic stop date of 5/17.  Height: 4\' 9"  (144.8 cm) Weight: 88 kg (194 lb 0.1 oz) IBW/kg (Calculated) : 38.6  Temp (24hrs), Avg:97.7 F (36.5 C), Min:97.5 F (36.4 C), Max:98.1 F (36.7 C)  No results for input(s): "WBC", "CREATININE", "LATICACIDVEN", "VANCOTROUGH", "VANCOPEAK", "VANCORANDOM", "GENTTROUGH", "GENTPEAK", "GENTRANDOM", "TOBRATROUGH", "TOBRAPEAK", "TOBRARND", "AMIKACINPEAK", "AMIKACINTROU", "AMIKACIN" in the last 168 hours.  Estimated Creatinine Clearance: 37.6 mL/min (A) (by C-G formula based on SCr of 1.21 mg/dL (H)).    Allergies  Allergen Reactions   Coconut Flavoring Agent (Non-Screening) Anaphylaxis    Anything with coconut in it   Cephalosporins Hives   Morphine And Codeine     dizziness   Sulfa Antibiotics Hives and Rash   Sulfasalazine Hives and Rash   Ketorolac      Headaches and dizziness   Cefuroxime Axetil Itching and Rash   Estrogens Rash    Lynn Dixon, PharmD 08/26/2023 7:57 PM

## 2023-08-26 NOTE — Op Note (Signed)
 DATE OF SURGERY:  08/26/2023 TIME: 4:30 PM  PATIENT NAME:  Lynn Dixon   AGE: 73 y.o.    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   OPERATIVE IMPLANTS:  [employer] persona revision left size D fix tibial baseplate, 10 mm medial 5 mm lateral augment, splined press-fit stem 3 mm offset 12 mm diameter 135 mm length   persona revision femur size 3+ left, 5 mm distal medial 10 mm distal lateral augment, press-fit splined stem with 6 mm offset, 12 mm diameter 135 mm length, 12 mm CPS fixed-bearing poly Implant Name Type Inv. Item Serial No. Manufacturer Lot No. LRB No. Used Action  CEMENT BONE REFOBACIN [id number] US  - [id number] Cement CEMENT BONE REFOBACIN [id number] US   [employer](ORTH,TRAU,BIO,SG) [id number] Left 2 Implanted  AUG TIB HALF BLOCK DC LM 10 - [id number] Joint AUG TIB HALF BLOCK DC LM 10  [employer](ORTH,TRAU,BIO,SG) [id number] Left 1 Implanted  STEM EXT FEM 135L 12D 6 - [id number] Stem STEM EXT FEM 135L 12D 6  [employer](ORTH,TRAU,BIO,SG) [id number] Left 1 Implanted  COMPONENT FEM CMT PERS 3+ LT - [id number] Joint COMPONENT FEM CMT PERS 3+ LT  [employer](ORTH,TRAU,BIO,SG) [id number] Left 1 Implanted  AUGMENT TIB HALF BLC CD LL 5 - [id number] Joint AUGMENT TIB HALF BLC CD LL 5  [employer](ORTH,TRAU,BIO,SG) [id number] Left 1 Implanted  INSERT TIB PS CMTLS CC FX - [id number] Insert INSERT TIB PS CMTLS CC FX  [employer](ORTH,TRAU,BIO,SG) [id number] Left 1 Implanted  STEM FEM KNEE OFFSET 283T51V6 - [id number] Stem STEM FEM KNEE OFFSET [id number]  [employer](ORTH,TRAU,BIO,SG) [id number] Left 1 Implanted  FEMORAL DISTAL AUGMENT SIZE 3     [id number] Left 1 Implanted   INSERT TIB PS CMT KEEL SZ DL - [id number] Insert INSERT TIB PS CMT KEEL SZ DL  [employer](ORTH,TRAU,BIO,SG) [id number] Left 1 Implanted  FEMORAL DISTAL AUGMENT     [id number] Left 1 Implanted  HIGHLY CROSSLINKED POLYETHYLENE     [id number] Left 1 Implanted      PREOPERATIVE INDICATIONS:  Lynn Dixon is a 73 y.o. year old female who had undergone left total knee arthroplasty with Dr. Marland Silvas back in 2018.  She has been having grossly worsening pain and instability and varus deformity left knee over the last couple of years.  X-rays demonstrated varus subsidence and loosening of the tibial component.  Workup for infection with this demonstrate elevate inflammatory markers but had a dry tap on aspiration otherwise well-healed incision no other concerns for infection perioperatively.  Elected for intraoperative assessment and plan for revision of both components of her total knee arthroplasty.   The risks, benefits, and alternatives were discussed at length including but not limited to the risks of infection, bleeding, nerve injury, stiffness, blood clots, the need for revision surgery, cardiopulmonary complications, among others, and they were willing to proceed.  OPERATIVE FINDINGS AND UNIQUE ASPECTS OF THE CASE: Debonding loosening of the tibial baseplate with severe varus subsidence.  No cement bonded to the tibial baseplate  ESTIMATED BLOOD LOSS: 100cc  OPERATIVE DESCRIPTION:   Once adequate anesthesia, preoperative antibiotics, 1 g of vancomycin  and 500 mg of levofloxacin  given history of cephalosporin allergy, 1 gm of Tranexamic Acid ; the patient was positioned supine with a thigh tourniquet  placed.  The left lower extremity was prepped and draped in sterile fashion.  A time-  out was performed identifying the patient, planned procedure, and the appropriate extremity.     The leg was  exsanguinated, tourniquet elevated to 250 mmHg.  A midline incision was made utilizing his old scar.  A  medial parapatellar arthrotomy was performed.  A small serous effusion was appreciated.  No significant synovitis in the knee.  No evidence of purulence or infection.  A circumferential synovectomy was performed.  3 synovial tissue specimens were sent from the infrapatellar, lateral, medial gutters.  These were sent for aerobic anaerobic cultures.  We next performed a large medial release off the tibial plateau with Bovie cautery, to the posterior medial aspect of the tibia..   Suprapatellar fat pad was resected.  Soft tissue at the bone implant interface was resected.  The poly insert was removed without difficulty.  No wear or damage to the poly was noted.  The distance from the medial epicondyle to the joint line on the distal femur was measured to be 40mm medial and 35mm lateral.   We next turned our attention to explanting the femoral and tibial components.  The femoral component was well-fixed.  The peripheral edge was exposed circumferentially around the femoral component using Bovie cautery and a rongeur.  Next using quarter inch straight osteotome and a small ACL blade saw.  We were able to break up the implant cement interface circumferentially.  Next using the suprafemoral extractor we were able to remove the femoral component.  Minimal bone loss was noted.   Next we turned our attention to explanting the tibial component.  The tibial component was significantly subsided on the medial side and grossly loose.  Using the ACL saw blade the tibia component quickly popped up was ably extracted.  Notably there was no cement bonding to the tibial baseplate.  Using the [facility] cement tools we were able to remove the cement mantle from the tibial canal.    We next turned our attention to preparing the femoral and and tibial canal for a 135 mm press-fit stem.  We sequentially reamed up to 12 mm on the femur and 12 mm on the tibia which had good cortical fit.Aaron Aas      Next we turned our attention to tibial  preparation.  We then performed a freshen up cut of the proximal tibia at 0 degrees.  We then sized the tibial component and relative to the tibial canal the baseplate sat relatively posterior.  We used a 3 mm offset and were able to bring the baseplate anterior.  A size D tibial tray allowed us  to larger size without overhang.  Notably there was a large defect medially still in the bone that was relatively central cut for a 5 mm augment on the medial side.  Plan to fill the rest of the defect with cement as well as add metaphyseal fixation with a cone.  We broached sequentially up to a size fixed cone which had good fit and rotational stability.    Next we turned our attention to femoral sizing.  We felt that compared to the old implant size 3 femoral component best match to the size of the prior implant.  The implant tracked relatively anteriorly so prepared a 6 mm offset at the 4:30 position to bring it posterior and lateral.  We match the patient's existing rotation as she had good patella tracking.    Next we carefully using  Bovie cautery and a Cobb did a posterior capsule release to help improve extension.  Trial components were both inserted on the femur and tibia.  I felt we had appropriately restore the joint line relative the medial lateral epicondyle distance..  Inferior pole the patella lined up with the distal aspect of the femoral implant.  We upsized sequentially to a 16 mm poly which had good stability in extension with varus valgus stress.  Notably still had some relative laxity anterior posterior 90 degrees of flexion so elected for a plus femur.  This point we are pleased with our trial components.  During this time the tourniquet which was at 127 minutes was deflated temporarily prior to cementation of her implants.  We achieved good hemostasis in the knee.     The real components were then opened and assembled on the back table. The tourniquet was reinflated for cementing.The tibial and  femoral components were cemented using antibiotic cement.  Felt we had good press-fit with our stems. The knee was irrigated with dilute Betadine irrigation and normal saline pulse lavage.  The synovial lining was  then injected a dilute Exparel  with quarter percent Marcaine  with epi and normal saline.   We again checked stability and since we added additional 5 mm augment to the proximal tibia we trialed both the 20 and 12 mm CPS poly selected for 12 mm CPS insert.  The real polyethylene liner was then opened and inserted.  At this point we were pleased with our flexion extension gaps and range of motion, and patellar tracking was excellent.         The tourniquet was again let down.  No significant  hemostasis was required.  The medial parapatellar arthrotomy was then reapproximated using #1 Vicryl and #1 Stratafix sutures with the knee in 45 degrees of flexion.  The  remaining wound was closed with 2-0 Vicryl, and interrupted 3-0 Nylon  The knee was cleaned, dried, dressed with a Prevena incisional wound VAC.  The patient was then  brought to recovery room in stable condition, tolerating the procedure  well. There were no complications.     Post op recs: WB: WBAT Abx: Vancomycin  and Cipro  given perioperatively given patient's history of cephalosporin allergy, discharged with extended antibiotics with doxycycline Imaging: PACU xrays DVT prophylaxis: Aspirin  81mg  BID x4 weeks Follow up: 1 weeks after surgery for a wound check with Dr. Pryor Browning at [facility].  Address: [address], [place], [place] [address]  Office Phone: [phone]   Priscille Brought, MD Orthopaedic Surgery

## 2023-08-26 NOTE — Transfer of Care (Signed)
 Immediate Anesthesia Transfer of Care Note  Patient: Lynn Dixon  Procedure(s) Performed: TOTAL KNEE REVISION (Left: Knee) APPLICATION, WOUND VAC (Left: Knee)  Patient Location: PACU  Anesthesia Type:Spinal  Level of Consciousness: awake and patient cooperative  Airway & Oxygen  Therapy: Patient Spontanous Breathing and Patient connected to face mask  Post-op Assessment: Report given to RN and Post -op Vital signs reviewed and stable  Post vital signs: Reviewed and stable  Last Vitals:  Vitals Value Taken Time  BP    Temp    Pulse    Resp    SpO2      Last Pain:  Vitals:   08/26/23 1230  TempSrc: Oral  PainSc:          Complications: No notable events documented.

## 2023-08-26 NOTE — Anesthesia Procedure Notes (Signed)
 Anesthesia Regional Block: Adductor canal block   Pre-Anesthetic Checklist: , timeout performed,  Correct Patient, Correct Site, Correct Laterality,  Correct Procedure, Correct Position, site marked,  Risks and benefits discussed,  Surgical consent,  Pre-op evaluation,  At surgeon's request and post-op pain management  Laterality: Left  Prep: Maximum Sterile Barrier Precautions used, chloraprep       Needles:  Injection technique: Single-shot  Needle Type: Echogenic Stimulator Needle     Needle Length: 9cm  Needle Gauge: 22     Additional Needles:   Procedures:,,,, ultrasound used (permanent image in chart),,    Narrative:  Start time: 08/26/2023 12:30 PM End time: 08/26/2023 12:35 PM Injection made incrementally with aspirations every 5 mL.  Performed by: Personally  Anesthesiologist: Jacquelyne Matte, DO  Additional Notes: Monitors applied. No increased pain on injection. No increased resistance to injection. Injection made in 5cc increments. Good needle visualization. Patient tolerated procedure well.

## 2023-08-26 NOTE — Interval H&P Note (Signed)
 The patient has been re-examined, and the chart reviewed, and there have been no interval changes to the documented history and physical.    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   The risks, benefits, and alternatives have been discussed at length with patient, and the patient is willing to proceed.  Left knee marked. Consent has been signed.

## 2023-08-26 NOTE — Discharge Instructions (Addendum)
 INSTRUCTIONS AFTER JOINT REPLACEMENT   Remove items at home which could result in a fall. This includes throw rugs or furniture in walking pathways ICE to the affected joint every three hours while awake for 30 minutes at a time, for at least the first 3-5 days, and then as needed for pain and swelling.  Continue to use ice for pain and swelling. You may notice swelling that will progress down to the foot and ankle.  This is normal after surgery.  Elevate your leg when you are not up walking on it.   Continue to use the breathing machine you got in the hospital (incentive spirometer) which will help keep your temperature down.  It is common for your temperature to cycle up and down following surgery, especially at night when you are not up moving around and exerting yourself.  The breathing machine keeps your lungs expanded and your temperature down.  DIET:  As you were doing prior to hospitalization, we recommend a well-balanced diet.  DRESSING / WOUND CARE / SHOWERING:  Keep the surgical dressing until follow up.    The dressing is also water  resistant, however it is best to shower with an extra covering, such as saran wrap to keep it dry.  IF THE DRESSING FALLS OFF or the wound gets wet inside, change the dressing with sterile gauze and call our office for further instruction.  Please use good hand washing techniques before changing the dressing.  Do not use any lotions or creams on the incision until instructed by your surgeon.     ACTIVITY  Increase activity slowly as tolerated, but follow the weight bearing instructions below.   No driving for 6 weeks or until further direction given by your physician.  You cannot drive while taking narcotics.  No lifting or carrying greater than 10 lbs. until further directed by your surgeon. Avoid periods of inactivity such as sitting longer than an hour when not asleep. This helps prevent blood clots.  You may return to work once you are authorized by your  doctor.   WEIGHT BEARING: Weight bearing as tolerated with assist device (walker, cane, etc) as directed, use it as long as suggested by your surgeon or therapist, typically at least 4-6 weeks.  EXERCISES  Results after joint replacement surgery are often greatly improved when you follow the exercise, range of motion and muscle strengthening exercises prescribed by your doctor. Safety measures are also important to protect the joint from further injury. Any time any of these exercises cause you to have increased pain or swelling, decrease what you are doing until you are comfortable again and then slowly increase them. If you have problems or questions, call your caregiver or physical therapist for advice.   Rehabilitation is important following a joint replacement. After just a few days of immobilization, the muscles of the leg can become weakened and shrink (atrophy).  These exercises are designed to build up the tone and strength of the thigh and leg muscles and to improve motion. Often times heat used for twenty to thirty minutes before working out will loosen up your tissues and help with improving the range of motion but do not use heat for the first two weeks following surgery (sometimes heat can increase post-operative swelling).   These exercises can be done on a training (exercise) mat, on the floor, on a table or on a bed. Use whatever works the best and is most comfortable for you.    Use music or  television while you are exercising so that the exercises are a pleasant break in your day. This will make your life better with the exercises acting as a break in your routine that you can look forward to.   Perform all exercises about fifteen times, three times per day or as directed.  You should exercise both the operative leg and the other leg as well.  Exercises include:   Quad Sets - Tighten up the muscle on the front of the thigh (Quad) and hold for 5-10 seconds.   Straight Leg Raises -  With your knee straight (if you were given a brace, keep it on), lift the leg to 60 degrees, hold for 3 seconds, and slowly lower the leg.  Perform this exercise against resistance later as your leg gets stronger.  Leg Slides: Lying on your back, slowly slide your foot toward your buttocks, bending your knee up off the floor (only go as far as is comfortable). Then slowly slide your foot back down until your leg is flat on the floor again.  Angel Wings: Lying on your back spread your legs to the side as far apart as you can without causing discomfort.  Hamstring Strength:  Lying on your back, push your heel against the floor with your leg straight by tightening up the muscles of your buttocks.  Repeat, but this time bend your knee to a comfortable angle, and push your heel against the floor.  You may put a pillow under the heel to make it more comfortable if necessary.   A rehabilitation program following joint replacement surgery can speed recovery and prevent re-injury in the future due to weakened muscles. Contact your doctor or a physical therapist for more information on knee rehabilitation.   CONSTIPATION:  Constipation is defined medically as fewer than three stools per week and severe constipation as less than one stool per week.  Even if you have a regular bowel pattern at home, your normal regimen is likely to be disrupted due to multiple reasons following surgery.  Combination of anesthesia, postoperative narcotics, change in appetite and fluid intake all can affect your bowels.   YOU MUST use at least one of the following options; they are listed in order of increasing strength to get the job done.  They are all available over the counter, and you may need to use some, POSSIBLY even all of these options:    Drink plenty of fluids (prune juice may be helpful) and high fiber foods Colace 100 mg by mouth twice a day  Senokot for constipation as directed and as needed Dulcolax (bisacodyl ), take  with full glass of water   Miralax  (polyethylene glycol) once or twice a day as needed.  If you have tried all these things and are unable to have a bowel movement in the first 3-4 days after surgery call either your surgeon or your primary doctor.    If you experience loose stools or diarrhea, hold the medications until you stool forms back up.  If your symptoms do not get better within 1 week or if they get worse, check with your doctor.  If you experience "the worst abdominal pain ever" or develop nausea or vomiting, please contact the office immediately for further recommendations for treatment.  ITCHING:  If you experience itching with your medications, try taking only a single pain pill, or even half a pain pill at a time.  You can also use Benadryl  over the counter for itching or also to  help with sleep.   TED HOSE STOCKINGS:  Use stockings on both legs until for at least 2 weeks or as directed by physician office. They may be removed at night for sleeping.  MEDICATIONS:  See your medication summary on the "After Visit Summary" that nursing will review with you.  You may have some home medications which will be placed on hold until you complete the course of blood thinner medication.  It is important for you to complete the blood thinner medication as prescribed.  Blood clot prevention (DVT Prophylaxis): After surgery you are at an increased risk for a blood clot. you were prescribed a blood thinner, Aspirin  81mg , to be taken twice daily for a total of 4 weeks from surgery to help reduce your risk of getting a blood clot.  Signs of a pulmonary embolus (blood clot in the lungs) include sudden short of breath, feeling lightheaded or dizzy, chest pain with a deep breath, rapid pulse rapid breathing.  Signs of a blood clot in your arms or legs include new unexplained swelling and cramping, warm, red or darkened skin around the painful area.  Please call the office or 911 right away if these signs or  symptoms develop.  PRECAUTIONS:   If you experience chest pain or shortness of breath - call 911 immediately for transfer to the hospital emergency department.   If you develop a fever greater that 101 F, purulent drainage from wound, increased redness or drainage from wound, foul odor from the wound/dressing, or calf pain - CONTACT YOUR SURGEON.                                                   FOLLOW-UP APPOINTMENTS:  If you do not already have a post-op appointment, please call the office for an appointment to be seen by your surgeon.  Guidelines for how soon to be seen are listed in your "After Visit Summary", but are typically between 2-3 weeks after surgery.  If you have a specialized bandage, you may be told to follow up 1 week after surgery.  OTHER INSTRUCTIONS:  Knee Replacement:  Do not place pillow under knee, focus on keeping the knee straight while resting.  Place foam block, curve side up under heel at all times except when walking.  DO NOT modify, tear, cut, or change the foam block in any way.  POST-OPERATIVE OPIOID TAPER INSTRUCTIONS: It is important to wean off of your opioid medication as soon as possible. If you do not need pain medication after your surgery it is ok to stop day one. Opioids include: Codeine, Hydrocodone (Norco, Vicodin), Oxycodone (Percocet, oxycontin ) and hydromorphone  amongst others.  Long term and even short term use of opiods can cause: Increased pain response Dependence Constipation Depression Respiratory depression And more.  Withdrawal symptoms can include Flu like symptoms Nausea, vomiting And more Techniques to manage these symptoms Hydrate well Eat regular healthy meals Stay active Use relaxation techniques(deep breathing, meditating, yoga) Do Not substitute Alcohol  to help with tapering If you have been on opioids for less than two weeks and do not have pain than it is ok to stop all together.  Plan to wean off of opioids This plan  should start within one week post op of your joint replacement. Maintain the same interval or time between taking each dose and first decrease the dose.  Cut the total daily intake of opioids by one tablet each day Next start to increase the time between doses. The last dose that should be eliminated is the evening dose.   MAKE SURE YOU:  Understand these instructions.  Get help right away if you are not doing well or get worse.    Thank you for letting us  be a part of your medical care team.  It is a privilege we respect greatly.  We hope these instructions will help you stay on track for a fast and full recovery!

## 2023-08-26 NOTE — Anesthesia Procedure Notes (Addendum)
 Spinal  Patient location during procedure: OR Start time: 08/26/2023 1:06 PM End time: 08/26/2023 1:10 PM Reason for block: surgical anesthesia Staffing Performed: anesthesiologist  Anesthesiologist: Jacquelyne Matte, DO Performed by: Jacquelyne Matte, DO Authorized by: Jacquelyne Matte, DO   Preanesthetic Checklist Completed: patient identified, IV checked, risks and benefits discussed, surgical consent, monitors and equipment checked, pre-op evaluation and timeout performed Spinal Block Patient position: sitting Prep: DuraPrep and site prepped and draped Patient monitoring: cardiac monitor, continuous pulse ox and blood pressure Approach: midline Location: L3-4 Injection technique: single-shot Needle Needle type: Whitacre  Needle gauge: 22 G Needle length: 9 cm Assessment Sensory level: T6 Events: CSF return Additional Notes Functioning IV was confirmed and monitors were applied. Sterile prep and drape, including hand hygiene and sterile gloves were used. The patient was positioned and the spine was prepped. The skin was anesthetized with lidocaine .  Free flow of clear CSF was obtained prior to injecting local anesthetic into the CSF.  The spinal needle aspirated freely following injection.  The needle was carefully withdrawn.  The patient tolerated the procedure well.   Difficult spinal d/t large lumbar incision and scar tissue, body habitus. 22G whitacre used.

## 2023-08-26 NOTE — Progress Notes (Signed)
 Orthopedic Tech Progress Note Patient Details:  Lynn Dixon 1950/10/20 [id number]  Ortho Devices Type of Ortho Device: Bone foam zero knee Ortho Device/Splint Location: left Ortho Device/Splint Interventions: Ordered, Application, Adjustment   Post Interventions Patient Tolerated: Well Instructions Provided: Adjustment of device, Care of device  [name] 08/26/2023, 5:30 PM

## 2023-08-26 NOTE — Anesthesia Postprocedure Evaluation (Signed)
 Anesthesia Post Note  Patient: Troy Furnish  Procedure(s) Performed: TOTAL KNEE REVISION (Left: Knee) APPLICATION, WOUND VAC (Left: Knee)     Patient location during evaluation: PACU Anesthesia Type: Regional, Spinal and MAC Level of consciousness: awake and alert and oriented Pain management: pain level controlled Vital Signs Assessment: post-procedure vital signs reviewed and stable Respiratory status: spontaneous breathing, nonlabored ventilation and respiratory function stable Cardiovascular status: blood pressure returned to baseline and stable Postop Assessment: no headache, no backache, spinal receding and patient able to bend at knees Anesthetic complications: no   No notable events documented.  Last Vitals:  Vitals:   08/26/23 1800 08/26/23 1812  BP: 132/75 130/84  Pulse: (!) 52 (!) 54  Resp: 17 18  Temp: (!) 36.4 C (!) 36.4 C  SpO2: 96% 97%    Last Pain:  Vitals:   08/26/23 1812  TempSrc: Oral  PainSc:                  Jacquelyne Matte

## 2023-08-27 LAB — BASIC METABOLIC PANEL WITH GFR
Anion gap: 11 (ref 5–15)
BUN: 17 mg/dL (ref 8–23)
CO2: 21 mmol/L — ABNORMAL LOW (ref 22–32)
Calcium: 8.6 mg/dL — ABNORMAL LOW (ref 8.9–10.3)
Chloride: 106 mmol/L (ref 98–111)
Creatinine, Ser: 1.01 mg/dL — ABNORMAL HIGH (ref 0.44–1.00)
GFR, Estimated: 58 mL/min — ABNORMAL LOW (ref >60)
Glucose, Bld: 126 mg/dL — ABNORMAL HIGH (ref 70–99)
Potassium: 3.9 mmol/L (ref 3.5–5.1)
Sodium: 138 mmol/L (ref 135–145)

## 2023-08-27 LAB — CBC
HCT: 30.7 % — ABNORMAL LOW (ref 36.0–46.0)
Hemoglobin: 9.9 g/dL — ABNORMAL LOW (ref 12.0–15.0)
MCH: 30.2 pg (ref 26.0–34.0)
MCHC: 32.2 g/dL (ref 30.0–36.0)
MCV: 93.6 fL (ref 80.0–100.0)
Platelets: 138 10*3/uL — ABNORMAL LOW (ref 150–400)
RBC: 3.28 MIL/uL — ABNORMAL LOW (ref 3.87–5.11)
RDW: 12.9 % (ref 11.5–15.5)
WBC: 12.4 10*3/uL — ABNORMAL HIGH (ref 4.0–10.5)
nRBC: 0 % (ref 0.0–0.2)

## 2023-08-27 LAB — GLUCOSE, CAPILLARY: Glucose-Capillary: 136 mg/dL — ABNORMAL HIGH (ref 70–99)

## 2023-08-27 MED ORDER — LORATADINE 10 MG PO TABS
10.0000 mg | ORAL_TABLET | Freq: Every day | ORAL | Status: DC
  Administered 2023-08-28 – 2023-09-02 (×6): 10 mg via ORAL
  Filled 2023-08-27 (×6): qty 1

## 2023-08-27 MED ORDER — VANCOMYCIN HCL 1.25 G IV SOLR
1250.0000 mg | INTRAVENOUS | Status: AC
  Administered 2023-08-28: 1250 mg via INTRAVENOUS
  Filled 2023-08-27: qty 25

## 2023-08-27 MED ORDER — OXYBUTYNIN CHLORIDE 5 MG PO TABS
2.5000 mg | ORAL_TABLET | Freq: Every day | ORAL | Status: DC
  Administered 2023-08-28 – 2023-08-30 (×3): 2.5 mg via ORAL
  Filled 2023-08-27 (×3): qty 1

## 2023-08-27 MED ORDER — BUDESON-GLYCOPYRROL-FORMOTEROL 160-9-4.8 MCG/ACT IN AERO
2.0000 | INHALATION_SPRAY | Freq: Two times a day (BID) | RESPIRATORY_TRACT | Status: DC
  Administered 2023-08-28 – 2023-09-02 (×11): 2 via RESPIRATORY_TRACT
  Filled 2023-08-27: qty 5.9

## 2023-08-27 MED ORDER — BUSPIRONE HCL 5 MG PO TABS
5.0000 mg | ORAL_TABLET | Freq: Two times a day (BID) | ORAL | Status: DC
  Administered 2023-08-28 – 2023-09-02 (×11): 5 mg via ORAL
  Filled 2023-08-27 (×11): qty 1

## 2023-08-27 MED ORDER — SERTRALINE HCL 100 MG PO TABS
100.0000 mg | ORAL_TABLET | Freq: Two times a day (BID) | ORAL | Status: DC
  Administered 2023-08-28 – 2023-09-02 (×11): 100 mg via ORAL
  Filled 2023-08-27 (×11): qty 1

## 2023-08-27 MED ORDER — ARIPIPRAZOLE 2 MG PO TABS
2.0000 mg | ORAL_TABLET | Freq: Every day | ORAL | Status: DC
  Administered 2023-08-28 – 2023-09-02 (×6): 2 mg via ORAL
  Filled 2023-08-27 (×6): qty 1

## 2023-08-27 MED ORDER — FLUTICASONE PROPIONATE 50 MCG/ACT NA SUSP
2.0000 | Freq: Every day | NASAL | Status: DC
  Administered 2023-08-29 – 2023-09-02 (×5): 2 via NASAL
  Filled 2023-08-27: qty 16

## 2023-08-27 MED ORDER — GABAPENTIN 300 MG PO CAPS
900.0000 mg | ORAL_CAPSULE | Freq: Three times a day (TID) | ORAL | Status: DC
  Administered 2023-08-28 – 2023-08-30 (×9): 900 mg via ORAL
  Filled 2023-08-27 (×9): qty 3

## 2023-08-27 MED ORDER — MONTELUKAST SODIUM 10 MG PO TABS
10.0000 mg | ORAL_TABLET | Freq: Every day | ORAL | Status: DC
  Administered 2023-08-28 – 2023-09-02 (×6): 10 mg via ORAL
  Filled 2023-08-27 (×6): qty 1

## 2023-08-27 MED ORDER — ALBUTEROL SULFATE (2.5 MG/3ML) 0.083% IN NEBU
3.0000 mL | INHALATION_SOLUTION | Freq: Four times a day (QID) | RESPIRATORY_TRACT | Status: DC | PRN
Start: 2023-08-27 — End: 2023-09-02
  Administered 2023-08-29: 3 mL via RESPIRATORY_TRACT
  Filled 2023-08-27: qty 3

## 2023-08-27 MED ORDER — FUROSEMIDE 40 MG PO TABS: 40.0000 mg | ORAL_TABLET | Freq: Every day | ORAL | Status: DC | PRN

## 2023-08-27 MED ORDER — ATORVASTATIN CALCIUM 40 MG PO TABS
40.0000 mg | ORAL_TABLET | Freq: Every day | ORAL | Status: DC
  Administered 2023-08-28 – 2023-08-31 (×4): 40 mg via ORAL
  Filled 2023-08-27 (×4): qty 1

## 2023-08-27 MED ORDER — LISINOPRIL 20 MG PO TABS
20.0000 mg | ORAL_TABLET | Freq: Every day | ORAL | Status: DC
  Administered 2023-08-28 – 2023-08-30 (×3): 20 mg via ORAL
  Filled 2023-08-27 (×3): qty 1

## 2023-08-27 NOTE — TOC Transition Note (Signed)
 Transition of Care [facility]) - Discharge Note   Patient Details  Name: Lynn Dixon MRN: [id number] Date of Birth: 1950/11/15  [facility] CM/SW Contact:  Delilah Fend, LCSW Phone Number: 08/27/2023, 11:45 AM   Clinical Narrative:     Met with pt and family who confirm she had needed DME in the home.  HHPT prearranged with [facility] via ortho MD office prior to surgery.  Agency contact info on AVS.  No further TOC needs.  Final next level of care: Home w Home Health Services Barriers to Discharge: No Barriers Identified   Patient Goals and CMS Choice Patient states their goals for this hospitalization and ongoing recovery are:: return home          Discharge Placement                       Discharge Plan and Services Additional resources added to the After Visit Summary for                  DME Arranged: N/A DME Agency: NA       HH Arranged: PT HH Agency: [facility]        Social Drivers of Health (SDOH) Interventions SDOH Screenings   Food Insecurity: No Food Insecurity (08/26/2023)  Housing: Low Risk  (08/26/2023)  Transportation Needs: No Transportation Needs (08/26/2023)  Utilities: Not At Risk (08/26/2023)  Social Connections: Moderately Integrated (08/26/2023)  Tobacco Use: Medium Risk (08/26/2023)     Readmission Risk Interventions    08/27/2023   11:44 AM  Readmission Risk Prevention Plan  Transportation Screening Complete  PCP or Specialist Appt within 5-7 Days Complete  Home Care Screening Complete  Medication Review (RN CM) Complete

## 2023-08-27 NOTE — Plan of Care (Signed)
   Problem: Education: Goal: Knowledge of General Education information will improve Description: Including pain rating scale, medication(s)/side effects and non-pharmacologic comfort measures Outcome: Progressing   Problem: Activity: Goal: Risk for activity intolerance will decrease Outcome: Progressing   Problem: Pain Managment: Goal: General experience of comfort will improve and/or be controlled Outcome: Progressing

## 2023-08-27 NOTE — Plan of Care (Signed)
  Problem: Safety: Goal: Ability to remain free from injury will improve Outcome: Progressing   Problem: Education: Goal: Knowledge of the prescribed therapeutic regimen will improve Outcome: Progressing   Problem: Activity: Goal: Range of joint motion will improve Outcome: Progressing   Problem: Clinical Measurements: Goal: Postoperative complications will be avoided or minimized Outcome: Progressing   Problem: Pain Management: Goal: Pain level will decrease with appropriate interventions Outcome: Progressing

## 2023-08-27 NOTE — Progress Notes (Signed)
 Pharmacy Antibiotic Note  Lynn Dixon is a 73 y.o. female admitted on 08/26/2023 with Total knee revision. Pharmacy has been consulted for vancomycin  and ciprofloxacin  dosing for surgical prophylaxis.   Spoke with ortho PA - planning for IV antibiotics for 72 hours post-op. Pt received levofloxacin  500 mg IV and vancomycin  1000 mg IV pre-op today.   Today, 08/27/23 SCr down to 1.01 and est CrCl 102ml/min.  Re-calculated estimated AUC for vancomycin  dosing.  Plan: Continue Ciprofloxacin  400 mg IV q12h  Change Vancomycin  to 1250 mg IV q48 hours for estimated AUC of 488, using Vd 0.5 Antibiotic stop date of 5/17.  Height: 4\' 9"  (144.8 cm) Weight: 88 kg (194 lb 0.1 oz) IBW/kg (Calculated) : 38.6  Temp (24hrs), Avg:97.9 F (36.6 C), Min:97.5 F (36.4 C), Max:98.5 F (36.9 C)  Recent Labs  Lab 08/27/23 0350  WBC 12.4*  CREATININE 1.01*    Estimated Creatinine Clearance: 45.1 mL/min (A) (by C-G formula based on SCr of 1.01 mg/dL (H)).    Allergies  Allergen Reactions   Coconut Flavoring Agent (Non-Screening) Anaphylaxis    Anything with coconut in it   Cephalosporins Hives   Morphine And Codeine     dizziness   Sulfa Antibiotics Hives and Rash   Sulfasalazine Hives and Rash   Ketorolac      Headaches and dizziness   Cefuroxime Axetil Itching and Rash   Estrogens Rash    Delpha Fickle, PharmD, BCPS 08/27/2023 8:24 AM

## 2023-08-27 NOTE — Evaluation (Signed)
 Physical Therapy Evaluation Patient Details Name: Lynn Dixon MRN: [id number] DOB: Jan 09, 1951 Today's Date: 08/27/2023  History of Present Illness  73 y.o. female admitted 08/26/23 for left knee revision 2* left total knee aseptic loosening. PMH: obesity, OSA, COPD, cervical fusion, back surgery x 4, glaucoma, B TKA, DM, PTSD.  Clinical Impression  Pt is s/p TKA revision resulting in the deficits listed below (see PT Problem List). Pt just received IV dilaudid  15 minutes prior to PT session, she was somewhat lethargic. Min assist for bed mobility, min assist to take a few pivotal steps to recliner with RW. Pt's daughter reports pt has memory deficits and that she wears 3L O2 at home at baseline. Pt has h/o 3 falls in past 6 months, she attributes these to pain in her R hip and in her back. Good progress expected. Pt will have light assistance available from her spouse upon DC, he has a bad back so cannot lift.  Pt will benefit from acute skilled PT to increase their independence and safety with mobility to allow discharge.          If plan is discharge home, recommend the following: A little help with walking and/or transfers;A little help with bathing/dressing/bathroom;Assistance with cooking/housework;Assist for transportation;Help with stairs or ramp for entrance   Can travel by private vehicle        Equipment Recommendations None recommended by PT  Recommendations for Other Services       Functional Status Assessment Patient has had a recent decline in their functional status and demonstrates the ability to make significant improvements in function in a reasonable and predictable amount of time.     Precautions / Restrictions Precautions Precautions: Fall;Knee Precaution Booklet Issued: Yes (comment) Recall of Precautions/Restrictions: Impaired Precaution/Restrictions Comments: memory deficits; reviewed no pillow under knee Restrictions Weight Bearing Restrictions Per Provider  Order: No Other Position/Activity Restrictions: WBAT      Mobility  Bed Mobility Overal bed mobility: Needs Assistance Bed Mobility: Supine to Sit     Supine to sit: HOB elevated, Used rails, Min assist     General bed mobility comments: assist to raise trunk    Transfers Overall transfer level: Needs assistance Equipment used: Rolling walker (2 wheels) Transfers: Sit to/from Stand, Bed to chair/wheelchair/BSC Sit to Stand: Min assist, Contact guard assist   Step pivot transfers: Min assist, Contact guard assist       General transfer comment: VCs hand placement; VCs to fully back up to chair prior to sitting (pt did not follow this instruction, she sat before fully reaching chair); 3L O2 maintained during session    Ambulation/Gait                  Stairs            Wheelchair Mobility     Tilt Bed    Modified Rankin (Stroke Patients Only)       Balance Overall balance assessment: Needs assistance Sitting-balance support: Feet supported, No upper extremity supported Sitting balance-Leahy Scale: Fair     Standing balance support: Bilateral upper extremity supported, During functional activity, Reliant on assistive device for balance Standing balance-Leahy Scale: Poor                               Pertinent Vitals/Pain Pain Assessment Pain Assessment: 0-10 Pain Score: 7  Pain Location: L knee with movement Pain Descriptors / Indicators: Sore, Grimacing, Guarding, Operative site  guarding Pain Intervention(s): Limited activity within patient's tolerance, Monitored during session, Premedicated before session, Ice applied, Repositioned    Home Living Family/patient expects to be discharged to:: Private residence Living Arrangements: Spouse/significant other Available Help at Discharge: Family;Available 24 hours/day Type of Home: House Home Access: Stairs to enter Entrance Stairs-Rails: None Entrance Stairs-Number of Steps: 1    Home Layout: One level Home Equipment: Rollator (4 wheels);Rolling Environmental consultant (2 wheels) Additional Comments: lives with spouse who has a bad back    Prior Function Prior Level of Function : Independent/Modified Independent             Mobility Comments: walks with rollator; 3 falls in past 6 months ADLs Comments: independent     Extremity/Trunk Assessment   Upper Extremity Assessment Upper Extremity Assessment: Overall WFL for tasks assessed    Lower Extremity Assessment Lower Extremity Assessment: LLE deficits/detail LLE Deficits / Details: SLR -3/5, knee AAROM 0-45* LLE Sensation: WNL    Cervical / Trunk Assessment Cervical / Trunk Assessment: Normal  Communication        Cognition Arousal: Lethargic Behavior During Therapy: WFL for tasks assessed/performed, Flat affect   PT - Cognitive impairments: Memory, History of cognitive impairments                       PT - Cognition Comments: daughter reports pt has memory deficits Following commands: Impaired Following commands impaired: Follows one step commands with increased time     Cueing Cueing Techniques: Verbal cues, Tactile cues     General Comments      Exercises Total Joint Exercises Ankle Circles/Pumps: AROM, Both, 10 reps, Supine Heel Slides: AAROM, Left, 10 reps, Supine   Assessment/Plan    PT Assessment Patient needs continued PT services  PT Problem List Decreased strength;Decreased range of motion;Decreased activity tolerance;Decreased mobility;Pain       PT Treatment Interventions DME instruction;Gait training;Therapeutic exercise;Functional mobility training;Patient/family education;Therapeutic activities    PT Goals (Current goals can be found in the Care Plan section)  Acute Rehab PT Goals Patient Stated Goal: to get stronger to go home PT Goal Formulation: With patient/family Time For Goal Achievement: 09/03/23 Potential to Achieve Goals: Good    Frequency 7X/week      Co-evaluation               AM-PAC PT "6 Clicks" Mobility  Outcome Measure Help needed turning from your back to your side while in a flat bed without using bedrails?: A Little Help needed moving from lying on your back to sitting on the side of a flat bed without using bedrails?: A Little Help needed moving to and from a bed to a chair (including a wheelchair)?: A Little Help needed standing up from a chair using your arms (e.g., wheelchair or bedside chair)?: A Little Help needed to walk in hospital room?: A Lot Help needed climbing 3-5 steps with a railing? : A Lot 6 Click Score: 16    End of Session Equipment Utilized During Treatment: Gait belt Activity Tolerance: Patient limited by fatigue;Patient limited by pain Patient left: in chair;with chair alarm set;with call bell/phone within reach Nurse Communication: Mobility status PT Visit Diagnosis: Pain;Difficulty in walking, not elsewhere classified (R26.2);History of falling (Z91.81) Pain - Right/Left: Left Pain - part of body: Knee    Time: 0902-0927 PT Time Calculation (min) (ACUTE ONLY): 25 min   Charges:   PT Evaluation $PT Eval Moderate Complexity: 1 Mod PT Treatments $Therapeutic Activity: 8-22 mins  PT General Charges $$ ACUTE PT VISIT: 1 Visit        Daymon Evans PT 08/27/2023  Acute Rehabilitation Services  Office [phone]

## 2023-08-27 NOTE — Progress Notes (Signed)
     Subjective:  Patient reports pain as mild.  No issues overnight.  Discussed intraoperative findings and plan for mobilization today with physical therapy.  Objective:   VITALS:   Vitals:   08/26/23 2016 08/27/23 0001 08/27/23 0405 08/27/23 0415  BP: (!) 128/57 121/62 114/71   Pulse: (!) 56 64 63   Resp: 17 18 18    Temp: 98.4 F (36.9 C) 97.6 F (36.4 C) 98.5 F (36.9 C)   TempSrc: Oral Oral Axillary   SpO2: 98% 92% (!) 89% 94%  Weight:      Height:        Sensation intact distally Intact pulses distally Dorsiflexion/Plantar flexion intact Incision: dressing C/D/I Compartment soft Wound VAC holding suction no output in canister  Lab Results  Component Value Date   WBC 12.4 (H) 08/27/2023   HGB 9.9 (L) 08/27/2023   HCT 30.7 (L) 08/27/2023   MCV 93.6 08/27/2023   PLT 138 (L) 08/27/2023   BMET    Component Value Date/Time   NA 138 08/27/2023 0350   K 3.9 08/27/2023 0350   CL 106 08/27/2023 0350   CO2 21 (L) 08/27/2023 0350   GLUCOSE 126 (H) 08/27/2023 0350   BUN 17 08/27/2023 0350   CREATININE 1.01 (H) 08/27/2023 0350   CALCIUM  8.6 (L) 08/27/2023 0350   GFRNONAA 58 (L) 08/27/2023 0350      Xray: Revision knee arthroplasty components in good position no adverse features  Assessment/Plan: 1 Day Post-Op   Principal Problem:   Failed total knee replacement (HCC)   Status post left knee revision arthroplasty for aseptic loosening 08/26/2023  Post op recs: WB: WBAT Abx: Vancomycin  and Cipro  given perioperatively given patient's history of cephalosporin allergy, discharged with extended antibiotics with doxycycline, intra-op cxs NGTD Imaging: PACU xrays DVT prophylaxis: Aspirin  81mg  BID x4 weeks Follow up: 1 weeks after surgery for a wound check with Dr. Pryor Browning at [facility].  Address: [address], [place], [place] [address]  Office Phone: [phone]    Murleen Arms 08/27/2023, 7:11 AM   Priscille Brought, MD  Contact information:   [phone] 7am-5pm epic message Dr. Pryor Browning, or call office for patient follow up: [phone] After hours and holidays please check [email or web address] for group call information for [facility]

## 2023-08-27 NOTE — Progress Notes (Signed)
 Physical Therapy Treatment Patient Details Name: Lynn Dixon MRN: [id number] DOB: [birth year] Today's Date: 08/27/2023   History of Present Illness 73 y.o. female admitted 08/26/23 for left knee revision 2* left total knee aseptic loosening. PMH: obesity, OSA, COPD, cervical fusion, back surgery x 4, glaucoma, B TKA, DM, PTSD.    PT Comments  Increased activity tolerance this session, pt ambulated 6' with RW, distance limited by pain and fatigue. Pt's daughter reports pt has memory deficits, she is requiring ongoing repeated verbal cues for safe technique with mobility.     If plan is discharge home, recommend the following: A little help with walking and/or transfers;A little help with bathing/dressing/bathroom;Assistance with cooking/housework;Assist for transportation;Help with stairs or ramp for entrance   Can travel by private vehicle        Equipment Recommendations  None recommended by PT    Recommendations for Other Services       Precautions / Restrictions Precautions Precautions: Fall;Knee Precaution Booklet Issued: Yes (comment) Recall of Precautions/Restrictions: Impaired Precaution/Restrictions Comments: memory deficits; reviewed no pillow under knee Restrictions Weight Bearing Restrictions Per Provider Order: No Other Position/Activity Restrictions: WBAT     Mobility  Bed Mobility Overal bed mobility: Needs Assistance Bed Mobility: Supine to Sit     Supine to sit: HOB elevated, Used rails, Min assist     General bed mobility comments: up on edge of bed    Transfers Overall transfer level: Needs assistance Equipment used: Rolling walker (2 wheels) Transfers: Sit to/from Stand Sit to Stand: Min assist   Step pivot transfers: Min assist, Contact guard assist       General transfer comment: VCs hand placement; VCs to fully back up to chair prior to sitting, assist to power up    Ambulation/Gait Ambulation/Gait assistance: Contact guard assist Gait  Distance (Feet): 6 Feet Assistive device: Rolling walker (2 wheels) Gait Pattern/deviations: Step-to pattern, Decreased stance time - left, Antalgic, Decreased dorsiflexion - left, Decreased step length - left, Decreased step length - right Gait velocity: decr     General Gait Details: distance limited by pain, VCs for max WBing thru BUEs for weight shift onto LLE, pt on 3L O2 with activity   Stairs             Wheelchair Mobility     Tilt Bed    Modified Rankin (Stroke Patients Only)       Balance Overall balance assessment: Needs assistance Sitting-balance support: Feet supported, No upper extremity supported Sitting balance-Leahy Scale: Fair     Standing balance support: Bilateral upper extremity supported, During functional activity, Reliant on assistive device for balance Standing balance-Leahy Scale: Poor                              Communication Communication Communication: No apparent difficulties  Cognition Arousal: Lethargic Behavior During Therapy: WFL for tasks assessed/performed, Flat affect   PT - Cognitive impairments: Memory, History of cognitive impairments                       PT - Cognition Comments: daughter reports pt has memory deficits Following commands: Impaired Following commands impaired: Follows one step commands with increased time    Cueing Cueing Techniques: Verbal cues, Tactile cues  Exercises Total Joint Exercises Ankle Circles/Pumps: AROM, Both, 10 reps, Supine Heel Slides: AAROM, Left, 10 reps, Supine    General Comments  Pertinent Vitals/Pain Pain Assessment Pain Assessment: 0-10 Pain Score: 9  Pain Location: L knee with movement Pain Descriptors / Indicators: Sore, Grimacing, Guarding, Operative site guarding Pain Intervention(s): Limited activity within patient's tolerance, Monitored during session, Patient requesting pain meds-RN notified, Repositioned, Ice applied    Home Living  Family/patient expects to be discharged to:: Private residence Living Arrangements: Spouse/significant other Available Help at Discharge: Family;Available 24 hours/day Type of Home: House Home Access: Stairs to enter Entrance Stairs-Rails: None Entrance Stairs-Number of Steps: 1   Home Layout: One level Home Equipment: Rollator (4 wheels);Rolling Walker (2 wheels) Additional Comments: lives with spouse who has a bad back    Prior Function            PT Goals (current goals can now be found in the care plan section) Acute Rehab PT Goals Patient Stated Goal: to get stronger to go home PT Goal Formulation: With patient/family Time For Goal Achievement: 09/03/23 Potential to Achieve Goals: Good Progress towards PT goals: Progressing toward goals    Frequency    7X/week      PT Plan      Co-evaluation              AM-PAC PT "6 Clicks" Mobility   Outcome Measure  Help needed turning from your back to your side while in a flat bed without using bedrails?: A Little Help needed moving from lying on your back to sitting on the side of a flat bed without using bedrails?: A Little Help needed moving to and from a bed to a chair (including a wheelchair)?: A Little Help needed standing up from a chair using your arms (e.g., wheelchair or bedside chair)?: A Little Help needed to walk in hospital room?: A Little Help needed climbing 3-5 steps with a railing? : A Lot 6 Click Score: 17    End of Session Equipment Utilized During Treatment: Gait belt Activity Tolerance: Patient limited by fatigue;Patient limited by pain Patient left: in chair;with chair alarm set;with call bell/phone within reach;with family/visitor present Nurse Communication: Mobility status PT Visit Diagnosis: Pain;Difficulty in walking, not elsewhere classified (R26.2);History of falling (Z91.81) Pain - Right/Left: Left Pain - part of body: Knee     Time: 8295-6213 PT Time Calculation (min) (ACUTE  ONLY): 20 min  Charges:    $Gait Training: 8-22 mins  PT General Charges $$ ACUTE PT VISIT: 1 Visit                    Daymon Evans PT 08/27/2023  Acute Rehabilitation Services  Office [phone]

## 2023-08-28 LAB — CBC
HCT: 28.5 % — ABNORMAL LOW (ref 36.0–46.0)
Hemoglobin: 9 g/dL — ABNORMAL LOW (ref 12.0–15.0)
MCH: 29.6 pg (ref 26.0–34.0)
MCHC: 31.6 g/dL (ref 30.0–36.0)
MCV: 93.8 fL (ref 80.0–100.0)
Platelets: 110 10*3/uL — ABNORMAL LOW (ref 150–400)
RBC: 3.04 MIL/uL — ABNORMAL LOW (ref 3.87–5.11)
RDW: 13.3 % (ref 11.5–15.5)
WBC: 8.5 10*3/uL (ref 4.0–10.5)
nRBC: 0 % (ref 0.0–0.2)

## 2023-08-28 MED ORDER — ORAL CARE MOUTH RINSE: 15.0000 mL | OROMUCOSAL | Status: DC | PRN

## 2023-08-28 NOTE — Progress Notes (Signed)
 Physical Therapy Treatment Patient Details Name: Lynn Dixon MRN: [id number] DOB: 03-Nov-1950 Today's Date: 08/28/2023   History of Present Illness 73 y.o. female admitted 08/26/23 for left knee revision 2* left total knee aseptic loosening. PMH: obesity, OSA, COPD, cervical fusion, back surgery x 4, glaucoma, B TKA, DM, PTSD.    PT Comments  Improved activity tolerance this session. Pt ambulated 45' + 10' with RW and 3L O2, SpO2 95% with activity. Pt requires ongoing cues for safe hand placement and for positioning in RW when walking. Performed HEP with supervision.    If plan is discharge home, recommend the following: A little help with walking and/or transfers;A little help with bathing/dressing/bathroom;Assistance with cooking/housework;Assist for transportation;Help with stairs or ramp for entrance   Can travel by private vehicle        Equipment Recommendations  None recommended by PT    Recommendations for Other Services       Precautions / Restrictions Precautions Precautions: Fall;Knee Precaution Booklet Issued: Yes (comment) Recall of Precautions/Restrictions: Impaired Precaution/Restrictions Comments: memory deficits; reviewed no pillow under knee Restrictions Weight Bearing Restrictions Per Provider Order: No Other Position/Activity Restrictions: WBAT     Mobility  Bed Mobility Overal bed mobility: Needs Assistance Bed Mobility: Supine to Sit     Supine to sit: Mod assist     General bed mobility comments: up in recliner    Transfers Overall transfer level: Needs assistance Equipment used: Rolling walker (2 wheels) Transfers: Sit to/from Stand Sit to Stand: Mod assist           General transfer comment: VCs hand placement; assist to power up    Ambulation/Gait Ambulation/Gait assistance: Min assist Gait Distance (Feet): 18 Feet (18' + 10' with seated rest) Assistive device: Rolling walker (2 wheels) Gait Pattern/deviations: Step-to pattern,  Decreased stance time - left, Antalgic, Decreased dorsiflexion - left, Decreased step length - left, Decreased step length - right Gait velocity: decr     General Gait Details: 25' +12' with seated rest, distance limited by pain and fatigue, VCs for max WBing thru BUEs for weight shift onto LLE, VCs for positioning in RW, pt on 3L O2 with activity, SpO2 95% on 3L with activity   Stairs             Wheelchair Mobility     Tilt Bed    Modified Rankin (Stroke Patients Only)       Balance Overall balance assessment: Needs assistance Sitting-balance support: Feet supported, No upper extremity supported Sitting balance-Leahy Scale: Fair     Standing balance support: Bilateral upper extremity supported, During functional activity, Reliant on assistive device for balance Standing balance-Leahy Scale: Poor                              Communication Communication Communication: No apparent difficulties  Cognition Arousal: Alert Behavior During Therapy: WFL for tasks assessed/performed   PT - Cognitive impairments: Memory, History of cognitive impairments                       PT - Cognition Comments: daughter reports pt has memory deficits Following commands: Impaired Following commands impaired: Follows one step commands with increased time    Cueing Cueing Techniques: Verbal cues, Tactile cues  Exercises Total Joint Exercises Ankle Circles/Pumps: AROM, Both, 10 reps, Supine Quad Sets: AROM, Both, 10 reps, Supine Short Arc Quad: AAROM, Left, 10 reps, Supine Heel Slides: AAROM,  Left, 10 reps, Supine, Limitations Heel Slides Limitations: only tolerated ~25* flexion AAROM, muscle guarding noted, limited by pain Goniometric ROM: 0-15* AAROM L knee    General Comments        Pertinent Vitals/Pain Pain Assessment Pain Score: 7  Pain Location: L knee with movement Pain Descriptors / Indicators: Sore, Grimacing, Guarding, Operative site  guarding Pain Intervention(s): Limited activity within patient's tolerance, Monitored during session, Premedicated before session, Repositioned    Home Living                          Prior Function            PT Goals (current goals can now be found in the care plan section) Acute Rehab PT Goals Patient Stated Goal: to get stronger to go home PT Goal Formulation: With patient/family Time For Goal Achievement: 09/03/23 Potential to Achieve Goals: Good Progress towards PT goals: Progressing toward goals    Frequency    7X/week      PT Plan      Co-evaluation              AM-PAC PT "6 Clicks" Mobility   Outcome Measure  Help needed turning from your back to your side while in a flat bed without using bedrails?: A Little Help needed moving from lying on your back to sitting on the side of a flat bed without using bedrails?: A Lot Help needed moving to and from a bed to a chair (including a wheelchair)?: A Lot Help needed standing up from a chair using your arms (e.g., wheelchair or bedside chair)?: A Lot Help needed to walk in hospital room?: A Little Help needed climbing 3-5 steps with a railing? : A Lot 6 Click Score: 14    End of Session Equipment Utilized During Treatment: Gait belt;Oxygen  Activity Tolerance: Patient limited by fatigue;Patient limited by pain Patient left: in chair;with chair alarm set;with call bell/phone within reach;with family/visitor present Nurse Communication: Mobility status PT Visit Diagnosis: Pain;Difficulty in walking, not elsewhere classified (R26.2);History of falling (Z91.81) Pain - Right/Left: Left Pain - part of body: Knee     Time: 0347-4259 PT Time Calculation (min) (ACUTE ONLY): 31 min  Charges:    $Gait Training: 8-22 mins $Therapeutic Exercise: 8-22 mins PT General Charges $$ ACUTE PT VISIT: 1 Visit                    Daymon Evans PT 08/28/2023  Acute Rehabilitation Services  Office  [phone]

## 2023-08-28 NOTE — Progress Notes (Signed)
 Physical Therapy Treatment Patient Details Name: Lynn Dixon MRN: [id number] DOB: Aug 27, 1950 Today's Date: 08/28/2023   History of Present Illness 73 y.o. female admitted 08/26/23 for left knee revision 2* left total knee aseptic loosening. PMH: obesity, OSA, COPD, cervical fusion, back surgery x 4, glaucoma, B TKA, DM, PTSD.    PT Comments  Progress with mobility is slow. Pain, confusion (family reports worsening memory deficits over past several months), fatigue are limiting progress. Pt ambulated 8' with RW and 3L O2, with max encouragement. She is tolerating only ~15* L knee flexion AAROM, limited by pain, muscle guarding noted.  Patient will benefit from continued inpatient follow up therapy, <3 hours/day.     If plan is discharge home, recommend the following: A little help with walking and/or transfers;A little help with bathing/dressing/bathroom;Assistance with cooking/housework;Assist for transportation;Help with stairs or ramp for entrance   Can travel by private vehicle        Equipment Recommendations  None recommended by PT    Recommendations for Other Services       Precautions / Restrictions Precautions Precautions: Fall;Knee Precaution Booklet Issued: Yes (comment) Recall of Precautions/Restrictions: Impaired Precaution/Restrictions Comments: memory deficits; reviewed no pillow under knee Restrictions Weight Bearing Restrictions Per Provider Order: No Other Position/Activity Restrictions: WBAT     Mobility  Bed Mobility Overal bed mobility: Needs Assistance Bed Mobility: Supine to Sit     Supine to sit: Mod assist     General bed mobility comments: assist to raise trunk and pivot hips to edge of bed    Transfers Overall transfer level: Needs assistance Equipment used: Rolling walker (2 wheels) Transfers: Sit to/from Stand Sit to Stand: Mod assist, +2 physical assistance           General transfer comment: VCs hand placement; , assist to power  up    Ambulation/Gait Ambulation/Gait assistance: Min assist Gait Distance (Feet): 8 Feet Assistive device: Rolling walker (2 wheels) Gait Pattern/deviations: Step-to pattern, Decreased stance time - left, Antalgic, Decreased dorsiflexion - left, Decreased step length - left, Decreased step length - right Gait velocity: decr     General Gait Details: distance limited by pain and fatigue, VCs for max WBing thru BUEs for weight shift onto LLE, pt on 3L O2 with activity   Stairs             Wheelchair Mobility     Tilt Bed    Modified Rankin (Stroke Patients Only)       Balance Overall balance assessment: Needs assistance Sitting-balance support: Feet supported, No upper extremity supported Sitting balance-Leahy Scale: Fair     Standing balance support: Bilateral upper extremity supported, During functional activity, Reliant on assistive device for balance Standing balance-Leahy Scale: Poor                              Communication Communication Communication: No apparent difficulties  Cognition Arousal: Lethargic Behavior During Therapy: WFL for tasks assessed/performed, Flat affect   PT - Cognitive impairments: Memory, History of cognitive impairments                       PT - Cognition Comments: daughter reports pt has memory deficits Following commands: Impaired Following commands impaired: Follows one step commands with increased time    Cueing Cueing Techniques: Verbal cues, Tactile cues  Exercises Total Joint Exercises Ankle Circles/Pumps: AROM, Both, 10 reps, Supine Short Arc Quad: AAROM, Left,  10 reps, Supine Heel Slides: AAROM, Left, 10 reps, Supine, Limitations Heel Slides Limitations: only tolerated ~15* flexion AAROM, muscle guarding noted Goniometric ROM: 0-15* AAROM L knee    General Comments        Pertinent Vitals/Pain Pain Assessment Pain Score: 9  Pain Location: L knee with movement Pain Descriptors /  Indicators: Sore, Grimacing, Guarding, Operative site guarding Pain Intervention(s): Limited activity within patient's tolerance, Monitored during session, Premedicated before session, Ice applied, Repositioned    Home Living                          Prior Function            PT Goals (current goals can now be found in the care plan section) Acute Rehab PT Goals Patient Stated Goal: to get stronger to go home PT Goal Formulation: With patient/family Time For Goal Achievement: 09/03/23 Potential to Achieve Goals: Good Progress towards PT goals: Progressing toward goals    Frequency    7X/week      PT Plan      Co-evaluation              AM-PAC PT "6 Clicks" Mobility   Outcome Measure  Help needed turning from your back to your side while in a flat bed without using bedrails?: A Little Help needed moving from lying on your back to sitting on the side of a flat bed without using bedrails?: A Lot Help needed moving to and from a bed to a chair (including a wheelchair)?: A Lot Help needed standing up from a chair using your arms (e.g., wheelchair or bedside chair)?: A Lot Help needed to walk in hospital room?: A Little Help needed climbing 3-5 steps with a railing? : A Lot 6 Click Score: 14    End of Session Equipment Utilized During Treatment: Gait belt Activity Tolerance: Patient limited by fatigue;Patient limited by pain Patient left: in chair;with chair alarm set;with call bell/phone within reach;with family/visitor present Nurse Communication: Mobility status PT Visit Diagnosis: Pain;Difficulty in walking, not elsewhere classified (R26.2);History of falling (Z91.81) Pain - Right/Left: Left Pain - part of body: Knee     Time: 1610-9604 PT Time Calculation (min) (ACUTE ONLY): 24 min  Charges:    $Gait Training: 8-22 mins $Therapeutic Exercise: 8-22 mins PT General Charges $$ ACUTE PT VISIT: 1 Visit                     Daymon Evans PT 08/28/2023  Acute Rehabilitation Services  Office [phone]

## 2023-08-28 NOTE — Progress Notes (Signed)
     Subjective:  Patient reports pain as mild-moderate.  No overnight events.  Mobilized some yesterday with PT, hopeful for today.  Denies numbness or tingling.  No other complaints.  Plan for HHPT upon DC.  Objective:   VITALS:   Vitals:   08/27/23 1010 08/27/23 1439 08/27/23 2203 08/28/23 0632  BP: 121/76 120/69 108/63 130/88  Pulse: 62 64 60 86  Resp: 20 20 18 18   Temp: 97.8 F (36.6 C) 98.1 F (36.7 C) 98.1 F (36.7 C) 97.9 F (36.6 C)  TempSrc:   Oral Oral  SpO2: 97% 99% 94% 94%  Weight:      Height:        Resting comfortably in bed in NAD Sensation intact distally Intact pulses distally Dorsiflexion/Plantar flexion intact Incision: dressing C/D/I Compartment soft Wound VAC holding suction no output in canister Wiggles toes appropriately   Lab Results  Component Value Date   WBC 8.5 08/28/2023   HGB 9.0 (L) 08/28/2023   HCT 28.5 (L) 08/28/2023   MCV 93.8 08/28/2023   PLT 110 (L) 08/28/2023   BMET    Component Value Date/Time   NA 138 08/27/2023 0350   K 3.9 08/27/2023 0350   CL 106 08/27/2023 0350   CO2 21 (L) 08/27/2023 0350   GLUCOSE 126 (H) 08/27/2023 0350   BUN 17 08/27/2023 0350   CREATININE 1.01 (H) 08/27/2023 0350   CALCIUM  8.6 (L) 08/27/2023 0350   GFRNONAA 58 (L) 08/27/2023 0350      Xray: Revision knee arthroplasty components in good position no adverse features  Assessment/Plan: 2 Days Post-Op   Principal Problem:   Failed total knee replacement (HCC)   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 Continue mobilizing with PT and advancing diet as tolerated.  Plan for HHPT upon DC.    Post op recs: WB: WBAT Abx: Vancomycin  and Cipro  given perioperatively given patient's history of cephalosporin allergy, discharged with extended antibiotics with doxycycline, intra-op cxs NGTD Imaging: PACU xrays DVT prophylaxis: Aspirin  81mg  BID x4  weeks Follow up: 1 weeks after surgery for a wound check with Dr. Pryor Browning at [facility].  Address: [address], [place], [place] [address]  Office Phone: [phone]    Albertus Alt 08/28/2023, 6:45 AM   Contact information:   Weekdays 7am-5pm epic message Dr. Pryor Browning, or call office for patient follow up: [phone] After hours and holidays please check [email or web address] for group call information for [facility]

## 2023-08-28 NOTE — Progress Notes (Signed)
 Pt on CPAP machine at this time and is tolerating well. No distress noted. Will continue to monitor pt.      08/28/23 2314  BiPAP/CPAP/SIPAP  BiPAP/CPAP/SIPAP Pt Type Adult  BiPAP/CPAP/SIPAP Resmed  Dentures removed? Not applicable  Patient Home Machine No  Patient Home Mask Yes  Patient Home Tubing Yes  Auto Titrate Yes  CPAP/SIPAP surface wiped down Yes  Device Plugged into RED Power Outlet Yes

## 2023-08-28 NOTE — Progress Notes (Signed)
   08/28/23 0304  BiPAP/CPAP/SIPAP  BiPAP/CPAP/SIPAP Pt Type Adult  BiPAP/CPAP/SIPAP Resmed  Mask Type Full face mask  Dentures removed? Not applicable  Mask Size Medium  Patient Home Machine No  Patient Home Mask Yes  Patient Home Tubing Yes  Auto Titrate Yes  Minimum cmH2O 5 cmH2O  Maximum cmH2O 20 cmH2O  Device Plugged into RED Power Outlet Yes

## 2023-08-29 MED ORDER — DOXYCYCLINE HYCLATE 100 MG PO TABS
100.0000 mg | ORAL_TABLET | Freq: Two times a day (BID) | ORAL | Status: DC
  Administered 2023-08-30 – 2023-09-02 (×7): 100 mg via ORAL
  Filled 2023-08-29 (×8): qty 1

## 2023-08-29 MED ORDER — LIDOCAINE 5 % EX PTCH
1.0000 | MEDICATED_PATCH | CUTANEOUS | Status: DC
  Administered 2023-08-29 – 2023-09-01 (×4): 1 via TRANSDERMAL
  Filled 2023-08-29 (×5): qty 1

## 2023-08-29 NOTE — Plan of Care (Signed)
 Problem: Clinical Measurements: Goal: Ability to maintain clinical measurements within normal limits will improve Outcome: Progressing   Problem: Activity: Goal: Risk for activity intolerance will decrease Outcome: Progressing   Problem: Elimination: Goal: Will not experience complications related to bowel motility Outcome: Progressing   Problem: Pain Managment: Goal: General experience of comfort will improve and/or be controlled Outcome: Progressing   Problem: Safety: Goal: Ability to remain free from injury will improve Outcome: Progressing   Ara Knee, RN 08/29/23 3:47 PM

## 2023-08-29 NOTE — Progress Notes (Signed)
 Physical Therapy Treatment Patient Details Name: Lynn Dixon MRN: [id number] DOB: [birth year] Today's Date: 08/29/2023   History of Present Illness 73 y.o. female admitted 08/26/23 for left knee revision 2* left total knee aseptic loosening. PMH: obesity, OSA, COPD, cervical fusion, back surgery x 4, glaucoma, B TKA, DM, PTSD.    PT Comments  Pt ambulated 10'+ 12' +10' +10' with seated rest break, with RW and 3L O2 and max encouragement. Pt requires ongoing verbal cues for safe hand placement for transfers, and for safe positioning in RW, she is not retaining information. Daughter reports memory impairment is baseline. Pt's spouse and daughter have express concern about managing pt at home. May need to consider ST-SNF if progress remains limited.      If plan is discharge home, recommend the following: A little help with walking and/or transfers;A little help with bathing/dressing/bathroom;Assistance with cooking/housework;Assist for transportation;Help with stairs or ramp for entrance   Can travel by private vehicle        Equipment Recommendations  None recommended by PT    Recommendations for Other Services       Precautions / Restrictions Precautions Precautions: Fall;Knee Precaution Booklet Issued: Yes (comment) Recall of Precautions/Restrictions: Impaired Precaution/Restrictions Comments: memory deficits; reviewed no pillow under knee Restrictions Weight Bearing Restrictions Per Provider Order: No Other Position/Activity Restrictions: WBAT     Mobility  Bed Mobility               General bed mobility comments: up in recliner    Transfers Overall transfer level: Needs assistance Equipment used: Rolling walker (2 wheels) Transfers: Sit to/from Stand Sit to Stand: Mod assist           General transfer comment: VCs hand placement for each sit to stand x 4 trials (pt not retaining information); assist to power up    Ambulation/Gait Ambulation/Gait  assistance: Min assist Gait Distance (Feet): 10 Feet Assistive device: Rolling walker (2 wheels) Gait Pattern/deviations: Step-to pattern, Decreased stance time - left, Antalgic, Decreased dorsiflexion - left, Decreased step length - left, Decreased step length - right, Trunk flexed Gait velocity: decr     General Gait Details: 39' + 12' +10' + 10' with seated rest; pt fatigues quickly, she ambulated with 3L O2, reports 7/10 L knee pain walking, pt requires ongoing VCs for positioning in RW and for posture   Stairs             Wheelchair Mobility     Tilt Bed    Modified Rankin (Stroke Patients Only)       Balance Overall balance assessment: Needs assistance Sitting-balance support: Feet supported, No upper extremity supported Sitting balance-Leahy Scale: Fair     Standing balance support: Bilateral upper extremity supported, During functional activity, Reliant on assistive device for balance Standing balance-Leahy Scale: Poor                              Communication Communication Communication: No apparent difficulties  Cognition Arousal: Alert Behavior During Therapy: WFL for tasks assessed/performed   PT - Cognitive impairments: Memory, History of cognitive impairments                       PT - Cognition Comments: daughter reports pt has memory deficits Following commands: Impaired Following commands impaired: Follows one step commands with increased time    Cueing Cueing Techniques: Verbal cues, Tactile cues  Exercises Total Joint Exercises  Ankle Circles/Pumps: AROM, Both, 10 reps, Supine Heel Slides: AAROM, Left, 10 reps, Supine, Limitations Heel Slides Limitations: tolerated ~30* flexion AAROM,  limited by pain Goniometric ROM: 0-30* AAROM    General Comments        Pertinent Vitals/Pain Pain Assessment Pain Score: 8  Pain Location: L knee with movement Pain Descriptors / Indicators: Sore, Grimacing, Guarding, Operative  site guarding Pain Intervention(s): Limited activity within patient's tolerance, Monitored during session, Premedicated before session, Ice applied, Repositioned    Home Living                          Prior Function            PT Goals (current goals can now be found in the care plan section) Acute Rehab PT Goals Patient Stated Goal: to get stronger to go home PT Goal Formulation: With patient/family Time For Goal Achievement: 09/03/23 Potential to Achieve Goals: Good Progress towards PT goals: Progressing toward goals    Frequency    7X/week      PT Plan      Co-evaluation              AM-PAC PT "6 Clicks" Mobility   Outcome Measure  Help needed turning from your back to your side while in a flat bed without using bedrails?: A Little Help needed moving from lying on your back to sitting on the side of a flat bed without using bedrails?: A Lot Help needed moving to and from a bed to a chair (including a wheelchair)?: A Lot Help needed standing up from a chair using your arms (e.g., wheelchair or bedside chair)?: A Lot Help needed to walk in hospital room?: A Little Help needed climbing 3-5 steps with a railing? : A Lot 6 Click Score: 14    End of Session Equipment Utilized During Treatment: Gait belt;Oxygen  Activity Tolerance: Patient limited by fatigue;Patient limited by pain Patient left: in chair;with chair alarm set;with call bell/phone within reach;with family/visitor present Nurse Communication: Mobility status PT Visit Diagnosis: Pain;Difficulty in walking, not elsewhere classified (R26.2);History of falling (Z91.81) Pain - Right/Left: Left Pain - part of body: Knee     Time: 1308-6578 PT Time Calculation (min) (ACUTE ONLY): 26 min  Charges:    $Gait Training: 8-22 mins $Therapeutic Exercise: 8-22 mins PT General Charges $$ ACUTE PT VISIT: 1 Visit                     Lynn Dixon PT 08/29/2023  Acute Rehabilitation  Services  Office [phone]

## 2023-08-29 NOTE — Progress Notes (Signed)
 Physical Therapy Treatment Patient Details Name: Lynn Dixon MRN: [id number] DOB: 1950/06/03 Today's Date: 08/29/2023   History of Present Illness 73 y.o. female admitted 08/26/23 for left knee revision 2* left total knee aseptic loosening. PMH: obesity, OSA, COPD, cervical fusion, back surgery x 4, glaucoma, B TKA, DM, PTSD.    PT Comments  Pt ambulated 10' + 14' +10' + 12' with RW and 3L O2 with seated rest breaks, pt requires ongoing verbal cues for safe positioning in walker and for posture and she tends to flex at her hips. VCs for pursed lip breathing. At present she is not mobilizing well enough to DC home. Pt and spouse hope to DC home, daughter Debria Fang is concerned for pt's safety at home and prefers ST-SNF.     If plan is discharge home, recommend the following: A little help with walking and/or transfers;A little help with bathing/dressing/bathroom;Assistance with cooking/housework;Assist for transportation;Help with stairs or ramp for entrance   Can travel by private vehicle     Yes  Equipment Recommendations  None recommended by PT    Recommendations for Other Services       Precautions / Restrictions Precautions Precautions: Fall;Knee Precaution Booklet Issued: Yes (comment) Recall of Precautions/Restrictions: Impaired Precaution/Restrictions Comments: memory deficits; reviewed no pillow under knee Restrictions Weight Bearing Restrictions Per Provider Order: No Other Position/Activity Restrictions: WBAT     Mobility  Bed Mobility   Bed Mobility: Sit to Supine       Sit to supine: Mod assist   General bed mobility comments: assist for BLEs into bed, +2 max A to scoot up in bed    Transfers Overall transfer level: Needs assistance Equipment used: Rolling walker (2 wheels) Transfers: Sit to/from Stand Sit to Stand: Mod assist           General transfer comment: VCs hand placement for each sit to stand x 4 trials (pt not retaining information); assist to  power up    Ambulation/Gait Ambulation/Gait assistance: Min assist Gait Distance (Feet): 14 Feet Assistive device: Rolling walker (2 wheels) Gait Pattern/deviations: Step-to pattern, Decreased stance time - left, Antalgic, Decreased dorsiflexion - left, Decreased step length - left, Decreased step length - right, Trunk flexed Gait velocity: decr     General Gait Details: 2' + 14' +10' + 12' with seated rest; pt fatigues quickly, she ambulated with 3L O2, reports 5/10 L knee pain walking, pt requires ongoing VCs for positioning in RW and for posture as she tends to flex trunk, pt doesn't retain information. Daughter reports pt only walked short distances at home and would frequently sit on her rollator 2* SOB and R hip pain.   Stairs             Wheelchair Mobility     Tilt Bed    Modified Rankin (Stroke Patients Only)       Balance Overall balance assessment: Needs assistance Sitting-balance support: Feet supported, No upper extremity supported Sitting balance-Leahy Scale: Fair     Standing balance support: Bilateral upper extremity supported, During functional activity, Reliant on assistive device for balance Standing balance-Leahy Scale: Poor                              Communication Communication Communication: No apparent difficulties  Cognition Arousal: Alert Behavior During Therapy: WFL for tasks assessed/performed   PT - Cognitive impairments: Memory, History of cognitive impairments  PT - Cognition Comments: daughter reports pt has memory deficits Following commands: Impaired Following commands impaired: Follows one step commands with increased time    Cueing Cueing Techniques: Verbal cues, Tactile cues  Exercises Total Joint Exercises Ankle Circles/Pumps: AROM, Both, 10 reps, Supine Short Arc Quad: AAROM, Left, 10 reps, Supine Heel Slides: AAROM, Left, 10 reps, Supine, Limitations Heel Slides  Limitations: tolerated ~40* flexion AAROM,  limited by pain Goniometric ROM: 0-30* AAROM    General Comments        Pertinent Vitals/Pain Pain Assessment Pain Score: 5  Pain Location: L knee with movement Pain Descriptors / Indicators: Sore, Grimacing, Guarding, Operative site guarding Pain Intervention(s): Limited activity within patient's tolerance, Monitored during session, Premedicated before session, Ice applied, Repositioned    Home Living                          Prior Function            PT Goals (current goals can now be found in the care plan section) Acute Rehab PT Goals Patient Stated Goal: to get stronger to go home PT Goal Formulation: With patient/family Time For Goal Achievement: 09/03/23 Potential to Achieve Goals: Good Progress towards PT goals: Progressing toward goals    Frequency    7X/week      PT Plan      Co-evaluation              AM-PAC PT "6 Clicks" Mobility   Outcome Measure  Help needed turning from your back to your side while in a flat bed without using bedrails?: A Little Help needed moving from lying on your back to sitting on the side of a flat bed without using bedrails?: A Lot Help needed moving to and from a bed to a chair (including a wheelchair)?: A Lot Help needed standing up from a chair using your arms (e.g., wheelchair or bedside chair)?: A Lot Help needed to walk in hospital room?: A Little Help needed climbing 3-5 steps with a railing? : A Lot 6 Click Score: 14    End of Session Equipment Utilized During Treatment: Gait belt;Oxygen  Activity Tolerance: Patient limited by fatigue;Patient limited by pain Patient left: with call bell/phone within reach;with family/visitor present;in bed;with nursing/sitter in room;with bed alarm set Nurse Communication: Mobility status PT Visit Diagnosis: Pain;Difficulty in walking, not elsewhere classified (R26.2);History of falling (Z91.81) Pain - Right/Left:  Left Pain - part of body: Knee     Time: 7829-5621 PT Time Calculation (min) (ACUTE ONLY): 43 min  Charges:    $Gait Training: 8-22 mins $Therapeutic Exercise: 8-22 mins $Therapeutic Activity: 8-22 mins PT General Charges $$ ACUTE PT VISIT: 1 Visit                     Daymon Evans PT 08/29/2023  Acute Rehabilitation Services  Office [phone]

## 2023-08-29 NOTE — Plan of Care (Signed)
   Problem: Coping: Goal: Level of anxiety will decrease Outcome: Progressing   Problem: Pain Managment: Goal: General experience of comfort will improve and/or be controlled Outcome: Progressing

## 2023-08-29 NOTE — Plan of Care (Signed)
  Problem: Coping: Goal: Level of anxiety will decrease 08/29/2023 0523 by Beola Brazil, RN Outcome: Progressing 08/29/2023 0514 by Beola Brazil, RN Outcome: Progressing   Problem: Pain Managment: Goal: General experience of comfort will improve and/or be controlled 08/29/2023 0523 by Beola Brazil, RN Outcome: Progressing 08/29/2023 0514 by Beola Brazil, RN Outcome: Progressing   Problem: Safety: Goal: Ability to remain free from injury will improve 08/29/2023 0523 by Beola Brazil, RN Outcome: Progressing 08/29/2023 0514 by Beola Brazil, RN Outcome: Progressing

## 2023-08-29 NOTE — Plan of Care (Signed)
   Problem: Coping: Goal: Level of anxiety will decrease Outcome: Progressing   Problem: Pain Managment: Goal: General experience of comfort will improve and/or be controlled Outcome: Progressing   Problem: Safety: Goal: Ability to remain free from injury will improve Outcome: Progressing

## 2023-08-29 NOTE — TOC Progression Note (Signed)
 Transition of Care [facility]) - Progression Note    Patient Details  Name: Lynn Dixon MRN: [id number] Date of Birth: 02/04/1951  Transition of Care [facility]) CM/SW Contact  Amaryllis Junior, [place] Phone Number: 08/29/2023, 1:18 PM  Clinical Narrative:    Pt now recommended for SNF. PASRR obtained [phone] A. SNF referral faxed out; awaiting bed offers.     Barriers to Discharge: No Barriers Identified  Expected Discharge Plan and Services                         DME Arranged: N/A DME Agency: NA       HH Arranged: PT HH Agency: [facility]         Social Determinants of Health (SDOH) Interventions SDOH Screenings   Food Insecurity: No Food Insecurity (08/26/2023)  Housing: Low Risk  (08/26/2023)  Transportation Needs: No Transportation Needs (08/26/2023)  Utilities: Not At Risk (08/26/2023)  Social Connections: Moderately Integrated (08/26/2023)  Tobacco Use: Medium Risk (08/26/2023)    Readmission Risk Interventions    08/27/2023   11:44 AM  Readmission Risk Prevention Plan  Transportation Screening Complete  PCP or Specialist Appt within 5-7 Days Complete  Home Care Screening Complete  Medication Review (RN CM) Complete

## 2023-08-29 NOTE — [place] FL2 (Signed)
 [name]  MEDICAID FL2 LEVEL OF CARE FORM     IDENTIFICATION  Patient Name: Lynn Dixon Birthdate: Feb 03, 1951 Sex: female Admission Date (Current Location): 08/26/2023  [facility] and [place] Number:  Producer, television/film/video and Address:  [facility],  [address], [place] [address]      Provider Number: [id number]  Attending Physician Name and Address:  Murleen Arms, MD  Relative Name and Phone Number:  Avarey, Yaeger (Spouse)  [phone] [facility])    Current Level of Care: Hospital Recommended Level of Care: Skilled Nursing Facility Prior Approval Number:    Date Approved/Denied:   PASRR Number: [id number]  Discharge Plan: SNF    Current Diagnoses: Patient Active Problem List   Diagnosis Date Noted   Failed total knee replacement (HCC) 08/26/2023   Morbid obesity (HCC) 02/28/2020   Angina pectoris (HCC)    Exertional dyspnea    Primary localized osteoarthritis of left knee 02/27/2017   Cervical stenosis of spinal canal 07/23/2016   COPD exacerbation (HCC)    Centrilobular emphysema (HCC)    OSA (obstructive sleep apnea)    Other hyperlipidemia    Endotracheally intubated    Encounter for orogastric (OG) tube placement    Community acquired pneumonia of left lower lobe of lung 02/24/2016   Foraminal stenosis of lumbar region 12/04/2012   UTI (urinary tract infection) 11/30/2012   Acute lumbar back pain 11/28/2012   Left leg weakness 11/28/2012   HTN (hypertension), benign 11/28/2012   Other and unspecified hyperlipidemia 11/28/2012   COPD (chronic obstructive pulmonary disease) (HCC) 11/28/2012   Dehydration 11/28/2012    Orientation RESPIRATION BLADDER Height & Weight     Self, Time, Situation, Place  O2 (2L O2) Continent Weight: 194 lb 0.1 oz (88 kg) Height:  4\' 9"  (144.8 cm)  BEHAVIORAL SYMPTOMS/MOOD NEUROLOGICAL BOWEL NUTRITION STATUS      Continent Diet (Regular)  AMBULATORY STATUS COMMUNICATION OF NEEDS Skin   Limited Assist  Verbally Surgical wounds                       Personal Care Assistance Level of Assistance  Dressing, Feeding, Bathing Bathing Assistance: Limited assistance Feeding assistance: Independent Dressing Assistance: Limited assistance     Functional Limitations Info  Speech, Hearing, Sight Sight Info: Impaired Hearing Info: Adequate Speech Info: Adequate    SPECIAL CARE FACTORS FREQUENCY  PT (By licensed PT), OT (By licensed OT)     PT Frequency: 5x/wk OT Frequency: 5x/wk            Contractures Contractures Info: Present    Additional Factors Info  Allergies, Code Status Code Status Info: full code Allergies Info: Coconut Flavoring Agent (Non-screening)  Cephalosporins  Morphine And Codeine  Sulfa Antibiotics  Sulfasalazine  Ketorolac   Cefuroxime Axetil  Estrogens           Current Medications (08/29/2023):  This is the current hospital active medication list Current Facility-Administered Medications  Medication Dose Route Frequency Provider Last Rate Last Admin   acetaminophen  (TYLENOL ) tablet 325-650 mg  325-650 mg Oral Q6H PRN Cockerham, Alicia M, PA-C   650 mg at 08/28/23 1736   albuterol  (PROVENTIL ) (2.5 MG/3ML) 0.083% nebulizer solution 3 mL  3 mL Inhalation Q6H PRN Cockerham, Alicia M, PA-C   3 mL at 08/29/23 8469   ARIPiprazole  (ABILIFY ) tablet 2 mg  2 mg Oral Daily Cockerham, Alicia M, PA-C   2 mg at 08/29/23 6295   aspirin  chewable tablet 81 mg  81 mg Oral BID Cockerham, Alicia M, PA-C   81 mg at 08/29/23 1610   atorvastatin  (LIPITOR) tablet 40 mg  40 mg Oral Daily Cockerham, Alicia M, PA-C   40 mg at 08/29/23 9604   budesonide -glycopyrrolate -formoterol  (BREZTRI ) 160-9-4.8 MCG/ACT inhaler 2 puff  2 puff Inhalation BID Cockerham, Alicia M, PA-C   2 puff at 08/29/23 5409   busPIRone  (BUSPAR ) tablet 5 mg  5 mg Oral BID Cockerham, Alicia M, PA-C   5 mg at 08/29/23 8119   ciprofloxacin  (CIPRO ) IVPB 400 mg  400 mg Intravenous Q12H Shireen Dory, [facility]   Stopping  previously hung infusion at 08/29/23 1159   diphenhydrAMINE  (BENADRYL ) 12.5 MG/5ML elixir 12.5-25 mg  12.5-25 mg Oral Q4H PRN Cockerham, Alicia M, PA-C       docusate sodium  (COLACE) capsule 100 mg  100 mg Oral BID Cockerham, Alicia M, PA-C   100 mg at 08/29/23 1478   fluticasone  (FLONASE ) 50 MCG/ACT nasal spray 2 spray  2 spray Each Nare Daily Cockerham, Alicia M, PA-C   2 spray at 08/29/23 2956   furosemide  (LASIX ) tablet 40 mg  40 mg Oral Daily PRN Cockerham, Alicia M, PA-C       gabapentin  (NEURONTIN ) capsule 900 mg  900 mg Oral TID Cockerham, Alicia M, PA-C   900 mg at 08/29/23 2130   HYDROmorphone  (DILAUDID ) injection 0.5-1 mg  0.5-1 mg Intravenous Q4H PRN Cockerham, Alicia M, PA-C   1 mg at 08/28/23 8657   lactated ringers  infusion   Intravenous Continuous Cockerham, Alicia M, PA-C 75 mL/hr at 08/26/23 2009 New Bag at 08/26/23 2009   lidocaine  (LIDODERM ) 5 % 1 patch  1 patch Transdermal Q24H Brown, Blaine K, PA-C       lisinopril  (ZESTRIL ) tablet 20 mg  20 mg Oral Daily Cockerham, Alicia M, PA-C   20 mg at 08/29/23 8469   loratadine  (CLARITIN ) tablet 10 mg  10 mg Oral Daily Cockerham, Alicia M, PA-C   10 mg at 08/29/23 6295   menthol -cetylpyridinium (CEPACOL) lozenge 3 mg  1 lozenge Oral PRN Cockerham, Alicia M, PA-C       Or   phenol (CHLORASEPTIC) mouth spray 1 spray  1 spray Mouth/Throat PRN Cockerham, Alicia M, PA-C       methocarbamol  (ROBAXIN ) tablet 500 mg  500 mg Oral Q6H PRN Cockerham, Alicia M, PA-C   500 mg at 08/29/23 2841   Or   methocarbamol  (ROBAXIN ) injection 500 mg  500 mg Intravenous Q6H PRN Cockerham, Alicia M, PA-C       montelukast  (SINGULAIR ) tablet 10 mg  10 mg Oral Daily Cockerham, Alicia M, PA-C   10 mg at 08/29/23 3244   ondansetron  (ZOFRAN ) tablet 4 mg  4 mg Oral Q6H PRN Cockerham, Alicia M, PA-C       Or   ondansetron  (ZOFRAN ) injection 4 mg  4 mg Intravenous Q6H PRN Cockerham, Alicia M, PA-C       Oral care mouth rinse  15 mL Mouth Rinse PRN Marchwiany, Daniel  A, MD       oxybutynin  (DITROPAN ) tablet 2.5 mg  2.5 mg Oral Daily Cockerham, Alicia M, PA-C   2.5 mg at 08/29/23 0102   oxyCODONE  (Oxy IR/ROXICODONE ) immediate release tablet 5-10 mg  5-10 mg Oral Q4H PRN Cockerham, Alicia M, PA-C   10 mg at 08/29/23 1252   pantoprazole  (PROTONIX ) EC tablet 40 mg  40 mg Oral Daily Cockerham, Alicia M, PA-C   40 mg at 08/29/23 7253  polyethylene glycol (MIRALAX  / GLYCOLAX ) packet 17 g  17 g Oral Daily PRN Cockerham, Alicia M, PA-C       sertraline  (ZOLOFT ) tablet 100 mg  100 mg Oral BID Cockerham, Alicia M, PA-C   100 mg at 08/29/23 4098   zolpidem  (AMBIEN ) tablet 5 mg  5 mg Oral QHS PRN Cockerham, Alicia M, PA-C   5 mg at 08/27/23 0023     Discharge Medications: Please see discharge summary for a list of discharge medications.  Relevant Imaging Results:  Relevant Lab Results:   Additional Information SSN [id number]  Amaryllis Junior, LCSW

## 2023-08-29 NOTE — Progress Notes (Signed)
 Subjective:  Patient reports pain as severe especially at back and hip. Per patient no overnight events.  Mobilized with PT this morning.  Denies numbness or tingling.  No other complaints.    Per daughter, there was an episode this morning when her left leg went numb while sitting on the side of the bed. This was resolved with change in position.  Patient has known significant degenerative changes of the lumbar spine s/p back surgery.  Had long conversation with family and patient regarding discharge planning as PT is recommending SNF if not progressing more.   Objective:   VITALS:   Vitals:   08/28/23 1406 08/28/23 2112 08/29/23 0524 08/29/23 0910  BP: 97/77 109/61 (!) 101/43   Pulse: 76 73 72   Resp: 17 15 16    Temp: 99.2 F (37.3 C) 99.7 F (37.6 C) 99.8 F (37.7 C)   TempSrc: Oral Oral Oral   SpO2: 93% 96% 98% 96%  Weight:      Height:        Resting comfortably in bed in NAD Sensation intact distally Intact pulses distally Dorsiflexion/Plantar flexion intact Incision: dressing C/D/I Compartment soft Wound VAC holding suction no output in canister Wiggles toes appropriately   Lab Results  Component Value Date   WBC 8.5 08/28/2023   HGB 9.0 (L) 08/28/2023   HCT 28.5 (L) 08/28/2023   MCV 93.8 08/28/2023   PLT 110 (L) 08/28/2023   BMET    Component Value Date/Time   NA 138 08/27/2023 0350   K 3.9 08/27/2023 0350   CL 106 08/27/2023 0350   CO2 21 (L) 08/27/2023 0350   GLUCOSE 126 (H) 08/27/2023 0350   BUN 17 08/27/2023 0350   CREATININE 1.01 (H) 08/27/2023 0350   CALCIUM  8.6 (L) 08/27/2023 0350   GFRNONAA 58 (L) 08/27/2023 0350      Xray: Revision knee arthroplasty components in good position no adverse features  Assessment/Plan: 3 Days Post-Op   Principal Problem:   Failed total knee replacement (HCC)   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  Continue mobilizing with PT and advancing diet as tolerated.  Plan for HHPT upon DC.   5/17 - Had long discussion with patient, husband, and daughter Debria Fang in room today. Patient does not want to go to SNF but had a hard time understanding the different between short term rehab and a permanent nursing facility. Patient's husband has a bad back with possible upcoming back surgery and is determined to have patient discharge home. He states he and Debria Fang will help with an assistance at home. Discussed with Debria Fang outside of room that she lives 1 hour away and does not feel that she and her father can give adequate help for her mother at home. She would prefer for her mother to go to SNF. She is very worried that her mother will have a fall at home since she is having a hard time understanding that she needs help while here in the hospital to get up on her own. No definite change in discharge plans made today, will plan to continue conversation tomorrow with Dr. Deedra Farr team.   Post op recs: WB: WBAT Abx: Vancomycin  and Cipro  given perioperatively given patient's history of cephalosporin allergy, discharged with extended antibiotics with doxycycline , intra-op cxs NGTD Imaging: PACU xrays DVT prophylaxis: Aspirin  81mg  BID x4 weeks Follow up: 1 weeks after surgery for a wound check with Dr.  Marchwiany at [facility].  Address: [address], [place], [place] [address]  Office Phone: [phone]    Abraham Hoffmann 08/29/2023, 11:26 AM   Contact information:   Weekdays 7am-5pm epic message Dr. Pryor Browning, or call office for patient follow up: [phone] After hours and holidays please check [email or web address] for group call information for [facility]

## 2023-08-30 ENCOUNTER — Inpatient Hospital Stay (HOSPITAL_COMMUNITY)

## 2023-08-30 DIAGNOSIS — R338 Other retention of urine: Secondary | ICD-10-CM | POA: Diagnosis not present

## 2023-08-30 DIAGNOSIS — T84018A Broken internal joint prosthesis, other site, initial encounter: Secondary | ICD-10-CM | POA: Diagnosis not present

## 2023-08-30 DIAGNOSIS — R410 Disorientation, unspecified: Secondary | ICD-10-CM | POA: Diagnosis not present

## 2023-08-30 DIAGNOSIS — D62 Acute posthemorrhagic anemia: Secondary | ICD-10-CM

## 2023-08-30 LAB — BLOOD GAS, VENOUS
Acid-base deficit: 1.1 mmol/L (ref 0.0–2.0)
Bicarbonate: 24.3 mmol/L (ref 20.0–28.0)
O2 Saturation: 96.8 %
Patient temperature: 36.8
pCO2, Ven: 42 mmHg — ABNORMAL LOW (ref 44–60)
pH, Ven: 7.37 (ref 7.25–7.43)
pO2, Ven: 65 mmHg — ABNORMAL HIGH (ref 32–45)

## 2023-08-30 LAB — CBC
HCT: 25.3 % — ABNORMAL LOW (ref 36.0–46.0)
Hemoglobin: 8.2 g/dL — ABNORMAL LOW (ref 12.0–15.0)
MCH: 30 pg (ref 26.0–34.0)
MCHC: 32.4 g/dL (ref 30.0–36.0)
MCV: 92.7 fL (ref 80.0–100.0)
Platelets: 140 10*3/uL — ABNORMAL LOW (ref 150–400)
RBC: 2.73 MIL/uL — ABNORMAL LOW (ref 3.87–5.11)
RDW: 13.6 % (ref 11.5–15.5)
WBC: 11.2 10*3/uL — ABNORMAL HIGH (ref 4.0–10.5)
nRBC: 0 % (ref 0.0–0.2)

## 2023-08-30 LAB — COMPREHENSIVE METABOLIC PANEL WITH GFR
ALT: 56 U/L — ABNORMAL HIGH (ref 0–44)
AST: 206 U/L — ABNORMAL HIGH (ref 15–41)
Albumin: 2.9 g/dL — ABNORMAL LOW (ref 3.5–5.0)
Alkaline Phosphatase: 56 U/L (ref 38–126)
Anion gap: 10 (ref 5–15)
BUN: 36 mg/dL — ABNORMAL HIGH (ref 8–23)
CO2: 24 mmol/L (ref 22–32)
Calcium: 8 mg/dL — ABNORMAL LOW (ref 8.9–10.3)
Chloride: 94 mmol/L — ABNORMAL LOW (ref 98–111)
Creatinine, Ser: 2.17 mg/dL — ABNORMAL HIGH (ref 0.44–1.00)
GFR, Estimated: 23 mL/min — ABNORMAL LOW (ref >60)
Glucose, Bld: 132 mg/dL — ABNORMAL HIGH (ref 70–99)
Potassium: 4.1 mmol/L (ref 3.5–5.1)
Sodium: 128 mmol/L — ABNORMAL LOW (ref 135–145)
Total Bilirubin: 1.2 mg/dL (ref 0.0–1.2)
Total Protein: 6.1 g/dL — ABNORMAL LOW (ref 6.5–8.1)

## 2023-08-30 MED ORDER — MUPIROCIN 2 % EX OINT: 1.0000 | TOPICAL_OINTMENT | Freq: Two times a day (BID) | CUTANEOUS | 0 refills | Status: AC

## 2023-08-30 MED ORDER — LACTATED RINGERS IV BOLUS: 1000.0000 mL | Freq: Once | INTRAVENOUS | Status: DC

## 2023-08-30 MED ORDER — ONDANSETRON HCL 4 MG PO TABS: 4.0000 mg | ORAL_TABLET | Freq: Three times a day (TID) | ORAL | 0 refills | Status: AC | PRN

## 2023-08-30 MED ORDER — GABAPENTIN 100 MG PO CAPS
100.0000 mg | ORAL_CAPSULE | Freq: Three times a day (TID) | ORAL | Status: DC
  Administered 2023-08-31 – 2023-09-02 (×7): 100 mg via ORAL
  Filled 2023-08-30 (×7): qty 1

## 2023-08-30 MED ORDER — OXYCODONE HCL 5 MG PO TABS: 5.0000 mg | ORAL_TABLET | ORAL | 0 refills | Status: AC | PRN

## 2023-08-30 MED ORDER — POLYETHYLENE GLYCOL 3350 17 G PO PACK: 17.0000 g | PACK | Freq: Every day | ORAL | Status: AC

## 2023-08-30 MED ORDER — CHLORHEXIDINE GLUCONATE CLOTH 2 % EX PADS
6.0000 | MEDICATED_PAD | Freq: Every day | CUTANEOUS | Status: DC
Start: 2023-08-30 — End: 2023-09-02
  Administered 2023-08-31 – 2023-09-01 (×2): 6 via TOPICAL

## 2023-08-30 MED ORDER — SODIUM CHLORIDE 0.9 % IV BOLUS
1000.0000 mL | Freq: Once | INTRAVENOUS | Status: AC
  Administered 2023-08-30: 1000 mL via INTRAVENOUS

## 2023-08-30 MED ORDER — METHOCARBAMOL 500 MG PO TABS: 500.0000 mg | ORAL_TABLET | Freq: Three times a day (TID) | ORAL | 0 refills | Status: AC | PRN

## 2023-08-30 MED ORDER — CHLORHEXIDINE GLUCONATE 4 % EX SOLN: 1.0000 | CUTANEOUS | 1 refills | Status: AC

## 2023-08-30 MED ORDER — DOXYCYCLINE HYCLATE 100 MG PO TABS
100.0000 mg | ORAL_TABLET | Freq: Two times a day (BID) | ORAL | 0 refills | Status: AC
Start: 2023-08-30 — End: 2023-09-06

## 2023-08-30 NOTE — Progress Notes (Signed)
 Physical Therapy Treatment Patient Details Name: Lynn Dixon MRN: [id number] DOB: 20-Jan-1951 Today's Date: 08/30/2023   History of Present Illness 73 y.o. female admitted 08/26/23 for left knee revision 2* left total knee aseptic loosening. PMH: obesity, OSA, COPD, cervical fusion, back surgery x 4, glaucoma, B TKA, DM, PTSD.    PT Comments  Pt is making slow progress, tolerating slightly more activity however remains at risk for falls d/t diminished cognition, unsteady gait with repeated knee buckling and poor safety awareness. Pt required +2 assist  throughout for safety and progression. Amb 8'/20'/22'/10' requiring seated rest breaks between distances d/t fatigue.  Dtr reports pt husband cannot physically assist pt at all.     If plan is discharge home, recommend the following: Two people to help with walking and/or transfers;Two people to help with bathing/dressing/bathroom;Assistance with cooking/housework;Assist for transportation;Help with stairs or ramp for entrance;Supervision due to cognitive status   Can travel by private vehicle     No  Equipment Recommendations  None recommended by PT    Recommendations for Other Services       Precautions / Restrictions Precautions Precautions: Fall;Knee Recall of Precautions/Restrictions: Impaired Restrictions Weight Bearing Restrictions Per Provider Order: No LLE Weight Bearing Per Provider Order: Weight bearing as tolerated     Mobility  Bed Mobility Overal bed mobility: Needs Assistance Bed Mobility: Rolling, Sidelying to Sit, Sit to Sidelying Rolling: Min assist, +2 for safety/equipment Sidelying to sit: Min assist, Used rails, +2 for safety/equipment Supine to sit: Min assist, Mod assist, +2 for safety/equipment     General bed mobility comments: multi-modal cues for technique and self assist; light assist to elevate  trunk; assist to lift LEs on to bed. cues for lateral scooting and repositioning self in bed     Transfers Overall transfer level: Needs assistance Equipment used: Rolling walker (2 wheels) Transfers: Sit to/from Stand Sit to Stand: Min assist, +2 safety/equipment           General transfer comment: ongoing education for proper hand placement and to power up with LEs. pt demonstrating some carryover with questioning cues this session for correct hand placement    Ambulation/Gait Ambulation/Gait assistance: +2 physical assistance, +2 safety/equipment, Min assist, Mod assist Gait Distance (Feet):  (8'/20'/22'/10') Assistive device: Rolling walker (2 wheels) Gait Pattern/deviations: Step-to pattern, Decreased stance time - left, Knee flexed in stance - right, Knee flexed in stance - left, Knees buckling, Trunk flexed, Decreased step length - right, Decreased step length - left Gait velocity: decr     General Gait Details: ongoing education for proper distance from RW, trunk extension,  step length and correct breathing. +2 to manage O2 tank and for pt safety, 3rd person for chair follow as pt needing seated therapeutic rest between distances.   Stairs             Wheelchair Mobility     Tilt Bed    Modified Rankin (Stroke Patients Only)       Balance   Sitting-balance support: Feet supported, No upper extremity supported Sitting balance-Leahy Scale: Fair     Standing balance support: Bilateral upper extremity supported, During functional activity, Reliant on assistive device for balance Standing balance-Leahy Scale: Poor                              Communication Communication Communication: No apparent difficulties Factors Affecting Communication: Difficulty expressing self  Cognition Arousal: Alert Behavior During Therapy: [facility]  for tasks assessed/performed   PT - Cognitive impairments: Memory, History of cognitive impairments, Attention, Initiation, Sequencing, Problem solving, Safety/Judgement, Orientation   Orientation impairments:  Place, Time                   PT - Cognition Comments: pt states she is at "[facility]", it is March the 14th. pt is easily distracted, requires multi-modal cues throughout session d/t decr attention, decr insight into deficits, decr safety awareness Following commands: Impaired Following commands impaired: Follows one step commands inconsistently, Follows multi-step commands inconsistently    Cueing Cueing Techniques: Verbal cues, Gestural cues, Tactile cues, Visual cues  Exercises      General Comments        Pertinent Vitals/Pain Pain Assessment Pain Assessment: 0-10 Pain Score: 10-Worst pain ever Pain Location: L knee with movement Pain Descriptors / Indicators: Sore, Grimacing, Guarding, Operative site guarding Pain Intervention(s): Limited activity within patient's tolerance, Monitored during session, Premedicated before session, Repositioned, RN gave pain meds during session    Home Living                          Prior Function            PT Goals (current goals can now be found in the care plan section) Acute Rehab PT Goals Patient Stated Goal: to get stronger to go home PT Goal Formulation: With patient/family Time For Goal Achievement: 09/03/23 Potential to Achieve Goals: Good Progress towards PT goals: Progressing toward goals (slowly)    Frequency    7X/week      PT Plan      Co-evaluation              AM-PAC PT "6 Clicks" Mobility   Outcome Measure  Help needed turning from your back to your side while in a flat bed without using bedrails?: A Little Help needed moving from lying on your back to sitting on the side of a flat bed without using bedrails?: A Little Help needed moving to and from a bed to a chair (including a wheelchair)?: A Lot Help needed standing up from a chair using your arms (e.g., wheelchair or bedside chair)?: A Little Help needed to walk in hospital room?: A Lot Help needed climbing 3-5 steps  with a railing? : Total 6 Click Score: 14    End of Session Equipment Utilized During Treatment: Gait belt;Oxygen  Activity Tolerance: Patient limited by fatigue;Other (comment) (cognition) Patient left: in bed;with call bell/phone within reach;with bed alarm set;with family/visitor present Nurse Communication: Mobility status PT Visit Diagnosis: Pain;Difficulty in walking, not elsewhere classified (R26.2);History of falling (Z91.81) Pain - Right/Left: Left Pain - part of body: Knee     Time: 1610-9604 PT Time Calculation (min) (ACUTE ONLY): 52 min  Charges:    $Gait Training: 23-37 mins $Therapeutic Activity: 8-22 mins PT General Charges $$ ACUTE PT VISIT: 1 Visit                     Lizann Edelman, PT  Acute Rehab Dept [facility]) [phone]  08/30/2023    [facility] 08/30/2023, 1:24 PM

## 2023-08-30 NOTE — Consult Note (Addendum)
 Initial Consultation Note   Patient: Lynn Dixon ZOX:[id number] DOB: Mar 25, 1951 PCP: Lynn Jerry, MD DOA: 08/26/2023 DOS: the patient was seen and examined on 08/30/2023 Primary service: Murleen Arms, MD  Referring physician: Priscille Brought, MD Reason for consult: confusion  Assessment/Plan: Assessment and Plan: No notes have been filed under this hospital service. Service: Hospitalist  Confusion Patient noted to have waxing and waning confusion today per nursing staff and daughter. She is oriented to person, place, month, and daughter on my exam. She did have an isolated low grade temp of 100.27F yesterday afternoon but has since remained afebrile. She is on oral doxycycline  post-op for 1 week course per orthopedics given knee revision surgery. ROS negative aside from burning with urination per patient. Suspect this may be primarily due to post-op delirium given waxing and waning mentation. However, could also be from acute urinary retention noted earlier today. Awaiting CMP which has been ordered by primary team to assess for electrolyte abnormalities, but BMP from 3 days ago without any significant abnormalities noted. Will check UA, CXR, blood cultures, and VBG to perform initial infectious workup. -add delirium precautions -f/u UA, CXR, blood cultures, VBG -f/u CMP -trend fever and WBC curves -have discontinued benadryl  and ambien   ADDENDUM: CMP showing sodium 128, Cr 2.17 (up from 1.01 about 3 days ago), and new liver enzyme elevations. Her lisinopril  has been stopped earlier by me. Suspect AKI is from volume depletion and possible contribution from urinary retention. She has not received any IVF in the past few days and per nursing staff oral fluid intake has been low. Will provide 1L IVF bolus. Will also check abdominal ultrasound to rule out obstructive cause of AKI and for further evaluation of new transaminitis. VBG looks okay, no hypercapnia or acidosis noted.    Acute urinary retention Patient noted to have acute urinary retention today with bladder scan showing 694cc of urine. In and out cath with about 650cc of output. Have ordered q4h bladder scans, if again noting >300 cc of urine would recommend foley catheter placement. Will stop her home oxybutynin . 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) -orthopedics primary, POD 4 -continue mobilizing with PT -plan for [facility] PT on discharge  Post-operative anemia -hgb 9.9 > 9.0 > 8.2 -management per primary team  COPD Chronic hypoxic respiratory failure -she is saturating well on her home 3L [place] -no wheezing on exam and no respiratory distress noted -continue home bronchodilators and mobilizing with PT  OSA -resume home CPAP at bedtime  HTN -BP ranging in the 140s-150s/70s earlier today though most recent BP 92/62 at 0918 -have asked RN to check BP -will hold her home lisinopril  for now -trend BP curve, goal MAP >65  Type 2 diabetes -A1c 5.7% 2 weeks ago -not on any medications for this at home -continue trending CBGs, goal 140-180  GERD -continue home PPI  Anxiety and depression -continue home abilify , zoloft , buspar    TRH will continue to follow the patient.  HPI: Lynn Dixon is a 73 y.o. female with past medical history of COPD, chronic hypoxic respiratory failure on 3L [place], hypertension, obesity, OSA on CPAP, hyperlipidemia, type II diabetes, GERD, chronic neck pain, anxiety and depression who was admitted by orthopedics for left total knee arthroplasty revision due to aseptic loosening.  Patient underwent revision on 5/14 and has  been doing well.  Earlier today, she was noted to become more confused.  Per orthopedic PA, patient is AAO x 3 though has been having difficulty with grasping information  and asking repeat questions.  Patient also noted to have a low-grade fever yesterday that subsided on its own.  TRH asked to evaluate patient for management of confusion.  During my encounter, daughter mentions that patient has had waxing and waning confusion today. There are times when she talks about things that have not happened this admission and other times when she appears more with it per daughter. She was noted to have acute urinary retention today with about 650cc of urine, required in and out cath. On my exam, patient is oriented to person, place, month, and able to state daughter and son's names. Patient does note some burning with urination today.   Review of Systems: As mentioned in the history of present illness. All other systems reviewed and are negative. Past Medical History:  Diagnosis Date   Abnormal stress test    Allergy    Arthritis    BACK   Chronic neck pain    Constipation    takes Sennokot daily   COPD (chronic obstructive pulmonary disease) (HCC)    Depression    PTSD-takes Zoloft  daily   Diabetes mellitus without complication (HCC)    diet controled   Dyspnea    GERD (gastroesophageal reflux disease)    Glaucoma    mild in left eye   H/O hiatal hernia    History of blood transfusion    History of bronchitis    History of cardiac cath    a. normal cors by cath in 04/2018 following an abnormal NST.   History of colon polyps    benign   History of echocardiogram    Echocardiogram 2/22: EF 65-70, no RWMA, normal diastolic function, normal RVSF, mild LAE, inadequate TR jet (indeterminate PASP)   Hyperlipidemia    Hypertension    Morbid obesity (HCC)    PONV (postoperative nausea and vomiting)    Sleep apnea    USES CPAP   Weakness    numbness and tingling. Takes Gabapentin  daily   Past Surgical History:  Procedure Laterality Date   ANTERIOR CERVICAL DECOMP/DISCECTOMY FUSION N/A 07/23/2016   Procedure: CERVICAL THREE-FOUR, CERVICAL FOUR-FIVE, CERVICAL  FIVE-SIX ANTERIOR CERVICAL DECOMPRESSION/DISCECTOMY FUSION;  Surgeon: Agustina Aldrich, MD;  Location: [facility] OR;  Service: Neurosurgery;  Laterality: N/A;   APPENDECTOMY     BACK SURGERY     x 4   BREAST REDUCTION SURGERY     CARPEL TUNNEL Bilateral    CATARACT EXTRACTION, BILATERAL  spring 2018   Dr. Consuela Denier   CHOLECYSTECTOMY     COLONOSCOPY     ESOPHAGOGASTRODUODENOSCOPY     LUMBAR LAMINECTOMY/DECOMPRESSION MICRODISCECTOMY Left 12/03/2012   Procedure: Left Lumbar Four to Five Diskectomy;  Surgeon: Adelbert Adler, MD;  Location: [facility] NEURO ORS;  Service: Neurosurgery;  Laterality: Left;  LUMBAR LAMINECTOMY/DECOMPRESSION MICRODISCECTOMY 1 LEVEL   RIGHT/LEFT HEART CATH AND CORONARY ANGIOGRAPHY N/A 05/12/2018   Procedure: RIGHT/LEFT HEART CATH AND CORONARY ANGIOGRAPHY;  Surgeon: Millicent Ally, MD;  Location: [facility] INVASIVE CV LAB;  Service: Cardiovascular;  Laterality: N/A;   TOTAL KNEE ARTHROPLASTY Right    TOTAL KNEE ARTHROPLASTY Left 02/27/2017   Procedure: LEFT TOTAL KNEE ARTHROPLASTY;  Surgeon: Marlena Sima, MD;  Location: [facility] OR;  Service: Orthopedics;  Laterality: Left;   Social History:  reports that she quit smoking about 19 years ago. Her  smoking use included cigarettes. She started smoking about 59 years ago. She has a 80 pack-year smoking history. She has never used smokeless tobacco. She reports that she does not drink alcohol  and does not use drugs.  Allergies  Allergen Reactions   Coconut Flavoring Agent (Non-Screening) Anaphylaxis    Anything with coconut in it   Cephalosporins Hives   Morphine And Codeine     dizziness   Sulfa Antibiotics Hives and Rash   Sulfasalazine Hives and Rash   Ketorolac      Headaches and dizziness   Cefuroxime Axetil Itching and Rash   Estrogens Rash    Family History  Problem Relation Age of Onset   Heart disease Mother    Stroke Mother    Emphysema Father    Pneumonia Father    Stroke Maternal Grandmother     Prior to Admission medications    Medication Sig Start Date End Date Taking? Authorizing Provider  acetaminophen  (TYLENOL ) 500 MG tablet Take 2 tablets (1,000 mg total) by mouth every 8 (eight) hours as needed. 08/26/23 09/25/23 Yes Albertus Alt, PA-C  ARIPiprazole  (ABILIFY ) 2 MG tablet Take 2 mg by mouth daily. 11/04/19  Yes [provider]  atorvastatin  (LIPITOR) 40 MG tablet Take 40 mg by mouth daily. 03/10/22  Yes [provider]  busPIRone  (BUSPAR ) 10 MG tablet Take 5 mg by mouth 2 (two) times daily.   Yes [provider]  cetirizine (ZYRTEC) 10 MG tablet Take 10 mg by mouth daily.   Yes [provider]  chlorhexidine  (HIBICLENS ) 4 % external liquid Apply 15 mLs (1 Application total) topically as directed for 30 doses. Use as directed daily for 5 days every other week for 6 weeks. 08/24/23  Yes Albertus Alt, PA-C  eszopiclone (LUNESTA) 2 MG TABS tablet Take 2 mg by mouth at bedtime as needed for sleep. Take immediately before bedtime   Yes [provider]  fluticasone  (FLONASE ) 50 MCG/ACT nasal spray Place 2 sprays into both nostrils daily.   Yes [provider]  Fluticasone -Umeclidin-Vilant (TRELEGY ELLIPTA ) 100-62.5-25 MCG/ACT AEPB Inhale 1 puff into the lungs daily. 06/01/23  Yes Aleck Hurdle, MD  furosemide  (LASIX ) 40 MG tablet Take 40 mg by mouth daily as needed for fluid.   Yes [provider]  gabapentin  (NEURONTIN ) 300 MG capsule Take 900 mg by mouth 3 (three) times daily.    Yes [provider]  lisinopril  (ZESTRIL ) 40 MG tablet Take 20 mg by mouth daily.   Yes [provider]  miconazole (MICOTIN) 2 % cream Apply 1 application topically 2 (two) times daily as needed (fungus).   Yes [provider]  montelukast  (SINGULAIR ) 10 MG tablet Take 10 mg by mouth daily.    Yes [provider]  mupirocin  ointment (BACTROBAN ) 2 % Place 1 Application into the nose 2 (two) times daily for 60 doses. Use as directed 2  times daily for 5 days every other week for 6 weeks. 08/24/23 09/23/23 Yes Albertus Alt, PA-C  nystatin cream (MYCOSTATIN) Apply 1 Application topically 2 (two) times daily as needed for dry skin. 06/25/23  Yes [provider]  omeprazole  (PRILOSEC) 20 MG capsule Take 40 mg by mouth daily before breakfast.    Yes [provider]  oxyBUTYnin  Chloride 2.5 MG TABS Take 2.5 mg by mouth daily. 04/06/23  Yes [provider]  sertraline  (ZOLOFT ) 100 MG tablet Take 100 mg by mouth 2 (two) times daily.    Yes [provider]  albuterol  (PROVENTIL ) (2.5 MG/3ML) 0.083% nebulizer solution Take 3 mLs (2.5 mg total) by nebulization every 6 (six) hours as needed for wheezing or shortness of breath. 06/01/23   Aleck Hurdle, MD  albuterol  (VENTOLIN  HFA) 108 [phone] Base) MCG/ACT inhaler Inhale 2 puffs into the lungs every 6 (six) hours as needed for wheezing or shortness of breath. 05/18/19   Joesph Mussel, DO  aspirin  EC 81 MG tablet Take 1 tablet (81 mg total) by mouth every 12 (twelve) hours. 08/26/23   Cockerham, Alicia M, PA-C  calcium -vitamin D  (OSCAL WITH D) 500-200 MG-UNIT tablet Take 1 tablet by mouth daily with breakfast.    [provider]  chlorhexidine  (HIBICLENS ) 4 % external liquid Apply 15 mLs (1 Application total) topically as directed for 30 doses. Use as directed daily for 5 days every other week for 6 weeks. 08/30/23   Albertus Alt, PA-C  doxycycline  (VIBRA -TABS) 100 MG tablet Take 1 tablet (100 mg total) by mouth 2 (two) times daily for 7 days. 08/30/23 09/06/23  Cockerham, Alicia M, PA-C  GLUCOSAMINE-CHONDROITIN PO Take 1 tablet by mouth at bedtime.     [provider]  HYDROcodone -acetaminophen  (NORCO/VICODIN) 5-325 MG tablet Take 1 tablet by mouth 2 (two) times daily as needed for moderate pain (pain score 4-6).    [provider]  methocarbamol  (ROBAXIN ) 500 MG tablet Take 1 tablet (500 mg total) by mouth every 8 (eight) hours as  needed for up to 10 days for muscle spasms. 08/30/23 09/09/23  Cockerham, Alicia M, PA-C  Multiple Vitamin (MULTIVITAMIN WITH MINERALS) TABS tablet Take 1 tablet by mouth daily.    [provider]  mupirocin  ointment (BACTROBAN ) 2 % Place 1 Application into the nose 2 (two) times daily for 60 doses. Use as directed 2 times daily for 5 days every other week for 6 weeks. 08/30/23 09/29/23  Albertus Alt, PA-C  Omega-3 Fatty Acids  (FISH OIL PO) Take 1,000 mg by mouth 2 (two) times daily.    [provider]  ondansetron  (ZOFRAN ) 4 MG tablet Take 1 tablet (4 mg total) by mouth every 8 (eight) hours as needed for up to 14 days for nausea or vomiting. 08/30/23 09/13/23  Albertus Alt, PA-C  oxyCODONE  (ROXICODONE ) 5 MG immediate release tablet Take 1 tablet (5 mg total) by mouth every 4 (four) hours as needed for up to 7 days for severe pain (pain score 7-10) or moderate pain (pain score 4-6). 08/30/23 09/06/23  Albertus Alt, PA-C  polyethylene glycol (MIRALAX ) 17 g packet Take 17 g by mouth daily. 08/30/23   Albertus Alt, PA-C    Physical Exam: Vitals:   08/30/23 0610 08/30/23 0805 08/30/23 0918 08/30/23 1333  BP: (!) 141/67 (!) 150/72 92/62   Pulse: 75   77  Resp: 14   18  Temp: 98.5 F (36.9 C)   99.6 F (37.6 C)  TempSrc: Oral   Oral  SpO2: 93%  95% 97%  Weight:      Height:       Physical Exam Constitutional:      Appearance: Normal appearance. She is obese. She is not ill-appearing.  HENT:     Head: Normocephalic and atraumatic.     Mouth/Throat:     Mouth: Mucous membranes are moist.     Pharynx: Oropharynx is clear. No oropharyngeal exudate.  Eyes:     General: No scleral icterus.    Extraocular Movements: Extraocular movements intact.  Conjunctiva/sclera: Conjunctivae normal.     Pupils: Pupils are equal, round, and reactive to light.  Cardiovascular:     Rate and Rhythm: Normal rate and regular rhythm.     Heart sounds: Normal heart sounds.  No murmur heard.    No friction rub. No gallop.  Pulmonary:     Effort: Pulmonary effort is normal. No respiratory distress.     Breath sounds: Normal breath sounds. No wheezing or rales.     Comments: Saturating well on home 3L [place]. Abdominal:     General: Bowel sounds are normal. There is no distension.     Palpations: Abdomen is soft.     Tenderness: There is no abdominal tenderness. There is no guarding or rebound.  Musculoskeletal:        General: No swelling. Normal range of motion.     Cervical back: Normal range of motion.  Skin:    General: Skin is warm and dry.  Neurological:     General: No focal deficit present.     Mental Status: She is alert.     Comments: Oriented to person, place, month, daughter. Some trouble noted with grasping information. She is moving all extremities spontaneously. Mild chronic tremor noted in bilateral hands.   Psychiatric:        Mood and Affect: Mood normal.        Behavior: Behavior normal.     Data Reviewed:   There are no new results to review at this time.       Latest Ref Rng & Units 08/30/2023   10:31 AM 08/28/2023    3:27 AM 08/27/2023    3:50 AM  CBC  WBC 4.0 - 10.5 K/uL 11.2  8.5  12.4   Hemoglobin 12.0 - 15.0 g/dL 8.2  9.0  9.9   Hematocrit 36.0 - 46.0 % 25.3  28.5  30.7   Platelets 150 - 400 K/uL 140  110  138     Family Communication: updated daughter at bedside Primary team communication: discussed with primary team Thank you very much for involving us  in the care of your patient.  Author: Maelin Kurkowski H Sharnita Bogucki, MD 08/30/2023 7:46 PM  For on call review [email or web address].

## 2023-08-30 NOTE — Progress Notes (Signed)
 Patient has grown increasingly confused as the day has progressed. However, she is easily reoriented with simple prompting.  Patients family stated that this level of confusion is abnormal for the patient. Noted that patient has  tremors in hands. Such tremors occurs off and on at home but not at this intensity. Patient is able to state her name, birthday, and situation.PA notified.

## 2023-08-30 NOTE — Progress Notes (Signed)
 PT TX NOTE  08/30/23 1400  PT Visit Information  Last PT Received On 08/30/23  History of Present Illness 73 y.o. female admitted 08/26/23 for left knee revision 2* left total knee aseptic loosening. PMH: obesity, OSA, COPD, cervical fusion, back surgery x 4, glaucoma, B TKA, DM, PTSD.   Session focused on therapeutic exercise and L  knee ROM. Pt tol with constant multi-modal cues/stim to maintain arousal for participation.  Left in Bonefoam EOS and asked pt dtr to remove in 10-15 minutes. Dtr  verbalized understanding. Reinforced knee precautions and proper positioning. Continue PT POC   Patient Stated Goal to get stronger to go home  Precautions     Precautions Fall;Knee  Recall of Precautions/Restrictions Impaired  Restrictions  Weight Bearing Restrictions Per Provider Order No  LLE Weight Bearing Per Provider Order WBAT  Pain Assessment  Pain Assessment No/denies pain  Pain Location grimaces with knee flexion  Pain Descriptors / Indicators Grimacing  Cognition  Arousal Obtunded  Behavior During Therapy Restless;Flat affect  Following Commands  Following commands impaired Follows one step commands inconsistently  Cueing  Cueing Techniques Verbal cues;Gestural cues;Tactile cues;Visual cues  Communication  Factors Affecting Communication Difficulty expressing self  Total Joint Exercises  Ankle Circles/Pumps AROM;Both;10 reps;Supine;Limitations  Ankle Circles/Pumps Limitations tactile and verbal cues needed  Quad Sets AROM;Both;10 reps;Supine  Short Arc Quad AAROM;Left;10 reps;Supine  Heel Slides AAROM;Left;10 reps;Supine;Limitations  Hip ABduction/ADduction AAROM;Left;10 reps  Straight Leg Raises AAROM;Strengthening;Left;10 reps  Goniometric ROM ~5 to 55 degrees knee flexion  PT - End of Session  Equipment Utilized During Treatment Oxygen   Activity Tolerance Patient limited by fatigue;Patient limited by lethargy  Patient left in bed;with call bell/phone within reach;with  bed alarm set;with family/visitor present   PT - Assessment/Plan  PT Visit Diagnosis Pain;Difficulty in walking, not elsewhere classified (R26.2);History of falling (Z91.81)  Pain - Right/Left Left  Pain - part of body Knee  PT Frequency (ACUTE ONLY) 7X/week  Follow Up Recommendations Skilled nursing-short term rehab (<3 hours/day)  Can patient physically be transported by private vehicle No  Patient can return home with the following Two people to help with walking and/or transfers;Two people to help with bathing/dressing/bathroom;Assistance with cooking/housework;Assist for transportation;Help with stairs or ramp for entrance;Supervision due to cognitive status  PT equipment None recommended by PT  AM-PAC PT "6 Clicks" Mobility Outcome Measure (Version 2)  Help needed turning from your back to your side while in a flat bed without using bedrails? 3  Help needed moving from lying on your back to sitting on the side of a flat bed without using bedrails? 3  Help needed moving to and from a bed to a chair (including a wheelchair)? 2  Help needed standing up from a chair using your arms (e.g., wheelchair or bedside chair)? 3  Help needed to walk in hospital room? 1  Help needed climbing 3-5 steps with a railing?  1  6 Click Score 13  Consider Recommendation of Discharge To: CIR/SNF/LTACH  PT Goal Progression  Progress towards PT goals Progressing toward goals  Acute Rehab PT Goals  PT Goal Formulation With patient/family  Time For Goal Achievement 09/03/23  Potential to Achieve Goals Fair  PT Time Calculation  PT Start Time (ACUTE ONLY) 1348  PT Stop Time (ACUTE ONLY) 1403  PT Time Calculation (min) (ACUTE ONLY) 15 min  PT General Charges  $$ ACUTE PT VISIT 1 Visit  PT Treatments  $Therapeutic Exercise 8-22 mins

## 2023-08-30 NOTE — Plan of Care (Signed)
  Problem: Coping: Goal: Level of anxiety will decrease Outcome: Progressing   Problem: Elimination: Goal: Will not experience complications related to bowel motility Outcome: Progressing   Problem: Pain Managment: Goal: General experience of comfort will improve and/or be controlled Outcome: Progressing   Problem: Safety: Goal: Ability to remain free from injury will improve Outcome: Progressing

## 2023-08-30 NOTE — Progress Notes (Signed)
 Subjective:  Patient reports pain as moderate, mainly back and hip. Per patient no overnight events.  Mobilized with PT yesterday 64ft + 30ft with RW though still with notable difficulty.  Currently trying to navigate to the bedside commode with assistance.  Denies numbness or tingling.  No other complaints.  Daughter at bedside.  Originally planned for HHPT, however new recommendations of SNF placement by PT.  TOC working on this.    Long discussion of short term rehabilitation vs permanent nursing home.  Daughter states aside they are unable to provide quality care at home if discharged immediately there, and patient would desire to be active enough that she worries the patient may fall.  Objective:   VITALS:   Vitals:   08/29/23 0910 08/29/23 1625 08/29/23 2158 08/30/23 0610  BP:  (!) 140/60 (!) 142/63 (!) 141/67  Pulse:  76 73 75  Resp:  18 16 14   Temp:  (!) 100.4 F (38 C) 98.6 F (37 C) 98.5 F (36.9 C)  TempSrc:  Oral Oral Oral  SpO2: 96% 97% 93% 93%  Weight:      Height:        Resting comfortably in bed in NAD Sensation intact distally Intact pulses distally Dorsiflexion/Plantar flexion intact Incision: dressing C/D/I Compartment soft Wound VAC holding suction no output in canister Wiggles toes appropriately Gait slowed, antalgic, with RW   Lab Results  Component Value Date   WBC 8.5 08/28/2023   HGB 9.0 (L) 08/28/2023   HCT 28.5 (L) 08/28/2023   MCV 93.8 08/28/2023   PLT 110 (L) 08/28/2023   BMET    Component Value Date/Time   NA 138 08/27/2023 0350   K 3.9 08/27/2023 0350   CL 106 08/27/2023 0350   CO2 21 (L) 08/27/2023 0350   GLUCOSE 126 (H) 08/27/2023 0350   BUN 17 08/27/2023 0350   CREATININE 1.01 (H) 08/27/2023 0350   CALCIUM  8.6 (L) 08/27/2023 0350   GFRNONAA 58 (L) 08/27/2023 0350      Xray: Revision knee arthroplasty components in good position no adverse features  Assessment/Plan: 4 Days Post-Op   Principal Problem:    Failed total knee replacement (HCC)   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 Continue mobilizing with PT and advancing diet as tolerated.  Plan for HHPT upon DC.   5/17: Had long discussion with patient, husband, and daughter Debria Fang in room today. Patient does not want to go to SNF but had a hard time understanding the different between short term rehab and a permanent nursing facility. Patient's husband has a bad back with possible upcoming back surgery and is determined to have patient discharge home. He states he and Debria Fang will help with an assistance at home. Discussed with Debria Fang outside of room that she lives 1 hour away and does not feel that she and her father can give adequate help for her mother at home. She would prefer for her mother to go to SNF. She is very worried that her mother will have a fall at home since she is having a hard time understanding that she needs help while here in the hospital to get up on her own. No definite change in discharge plans made today, will plan to continue conversation tomorrow with Dr. Deedra Farr team.  5/18: CBC pending.  Mobilizing some with PT though recommended for SNF over HHPT.  Long discussion with patient repetitive of info above.  Discussed difference of SNF vs permanent nursing home in detail.  Pt demonstrated understanding.  Pt eager to mobilize with PT today.  TOC looking for bed placement with SNF per discussion with Blaine PA-C. Intra-op cxs still NGTD.   Post op recs: WB: WBAT Abx: Vancomycin  and Cipro  given perioperatively given patient's history of cephalosporin allergy, discharged with extended antibiotics with doxycycline , intra-op cxs NGTD Imaging: PACU xrays DVT prophylaxis: Aspirin  81mg  BID x4 weeks Follow up: 1 weeks after surgery for a wound check with Dr. Pryor Browning at [facility].  Address: [address],  [place], [place] [address]  Office Phone: [phone]    Albertus Alt 08/30/2023, 7:15 AM   Contact information:   Weekdays 7am-5pm epic message Dr. Pryor Browning, or call office for patient follow up: [phone] After hours and holidays please check [email or web address] for group call information for [facility]

## 2023-08-31 ENCOUNTER — Inpatient Hospital Stay (HOSPITAL_COMMUNITY)

## 2023-08-31 DIAGNOSIS — T84018A Broken internal joint prosthesis, other site, initial encounter: Secondary | ICD-10-CM

## 2023-08-31 DIAGNOSIS — Z96659 Presence of unspecified artificial knee joint: Secondary | ICD-10-CM

## 2023-08-31 LAB — COMPREHENSIVE METABOLIC PANEL WITH GFR
ALT: 59 U/L — ABNORMAL HIGH (ref 0–44)
AST: 185 U/L — ABNORMAL HIGH (ref 15–41)
Albumin: 2.8 g/dL — ABNORMAL LOW (ref 3.5–5.0)
Alkaline Phosphatase: 69 U/L (ref 38–126)
Anion gap: 12 (ref 5–15)
BUN: 33 mg/dL — ABNORMAL HIGH (ref 8–23)
CO2: 21 mmol/L — ABNORMAL LOW (ref 22–32)
Calcium: 8.2 mg/dL — ABNORMAL LOW (ref 8.9–10.3)
Chloride: 100 mmol/L (ref 98–111)
Creatinine, Ser: 1.55 mg/dL — ABNORMAL HIGH (ref 0.44–1.00)
GFR, Estimated: 35 mL/min — ABNORMAL LOW (ref >60)
Glucose, Bld: 121 mg/dL — ABNORMAL HIGH (ref 70–99)
Potassium: 4 mmol/L (ref 3.5–5.1)
Sodium: 133 mmol/L — ABNORMAL LOW (ref 135–145)
Total Bilirubin: 1.3 mg/dL — ABNORMAL HIGH (ref 0.0–1.2)
Total Protein: 6.4 g/dL — ABNORMAL LOW (ref 6.5–8.1)

## 2023-08-31 LAB — URINALYSIS, COMPLETE (UACMP) WITH MICROSCOPIC
Bilirubin Urine: NEGATIVE
Glucose, UA: NEGATIVE mg/dL
Ketones, ur: NEGATIVE mg/dL
Leukocytes,Ua: NEGATIVE
Nitrite: NEGATIVE
Protein, ur: NEGATIVE mg/dL
Specific Gravity, Urine: 1.01 (ref 1.005–1.030)
pH: 5 (ref 5.0–8.0)

## 2023-08-31 LAB — AEROBIC/ANAEROBIC CULTURE W GRAM STAIN (SURGICAL/DEEP WOUND)
Culture: NO GROWTH
Culture: NO GROWTH
Culture: NO GROWTH
Gram Stain: NONE SEEN
Gram Stain: NONE SEEN
Gram Stain: NONE SEEN

## 2023-08-31 LAB — C-REACTIVE PROTEIN: CRP: 23.7 mg/dL — ABNORMAL HIGH (ref <1.0)

## 2023-08-31 LAB — TSH: TSH: 0.887 u[IU]/mL (ref 0.350–4.500)

## 2023-08-31 LAB — CBC
HCT: 23.9 % — ABNORMAL LOW (ref 36.0–46.0)
Hemoglobin: 7.5 g/dL — ABNORMAL LOW (ref 12.0–15.0)
MCH: 29.4 pg (ref 26.0–34.0)
MCHC: 31.4 g/dL (ref 30.0–36.0)
MCV: 93.7 fL (ref 80.0–100.0)
Platelets: 142 10*3/uL — ABNORMAL LOW (ref 150–400)
RBC: 2.55 MIL/uL — ABNORMAL LOW (ref 3.87–5.11)
RDW: 13.3 % (ref 11.5–15.5)
WBC: 9.3 10*3/uL (ref 4.0–10.5)
nRBC: 0 % (ref 0.0–0.2)

## 2023-08-31 LAB — PREPARE RBC (CROSSMATCH)

## 2023-08-31 LAB — MAGNESIUM: Magnesium: 1.5 mg/dL — ABNORMAL LOW (ref 1.7–2.4)

## 2023-08-31 LAB — VITAMIN B12: Vitamin B-12: 242 pg/mL (ref 180–914)

## 2023-08-31 LAB — AMMONIA: Ammonia: 32 umol/L (ref 9–35)

## 2023-08-31 LAB — FOLATE: Folate: 15.5 ng/mL (ref >5.9)

## 2023-08-31 MED ORDER — SODIUM CHLORIDE 0.9% IV SOLUTION: Freq: Once | INTRAVENOUS | Status: AC

## 2023-08-31 MED ORDER — SODIUM CHLORIDE 0.9 % IV SOLN: INTRAVENOUS | Status: DC

## 2023-08-31 NOTE — Progress Notes (Signed)
 PROGRESS NOTE    Lynn Dixon  QPY:[id number] DOB: Feb 23, 1951 DOA: 08/26/2023 PCP: Alston Jerry, MD   Brief Narrative:  73 y.o. 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  TRH consulted on 08/30/2023 for confusion and urinary retention.  She was found to have AKI with creatinine of 2.17 and was started on IV fluids.  Assessment & Plan:   AKI - Possibly from poor oral intake and dehydration.  Creatinine 2.17 on 08/30/2023.  Labs pending today.  Monitor.  Currently on IV fluids  Leukocytosis -Mild.  Labs pending today.  Monitor  Acute metabolic encephalopathy causing confusion - Possibly from above.  UA negative for UTI.  Chest x-ray on 08/30/2023 showed no acute abnormality.  Vitals currently stable with no temperature spikes.  Benadryl  and Ambien  have been discontinued. Mental status improving. -Monitor mental status.  Fall precautions.  Acute urinary retention -Foley catheter placed earlier this morning.  Was started on Flomax.  Can try voiding trial in the next couple of days.  Outpatient follow-up with urology   Elevated LFTs - Questionable cause.  Labs pending today.  Repeat a.m. LFTs.  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  Monitor intermittently.  COPD Chronic respiratory failure with hypoxia - Saturating well on 3 L oxygen  via nasal cannula.  Continue current inhaled regimen along with oral montelukast   OSA - Continue CPAP at bedtime  Diabetes mellitus type 2  -recent A1c 5.7.  Blood sugars currently stable.  If blood  sugars get elevated, we will need SSI  Hypertension Hyperlipidemia - Monitor blood pressure.  Lisinopril  held.  Hold statin as well because of elevated LFTs  Anxiety and depression--Home Abilify , Zoloft  and BuSpar  are being continued  Morbid obesity -Outpatient follow-up    Subjective: Patient seen and examined at bedside.  No agitation, fever, vomiting reported.  Foley catheter placed overnight as per nursing staff due to urinary retention. Husband at bedside states that mental status has much improved.  Objective: Vitals:   08/30/23 2012 08/30/23 2039 08/30/23 2153 08/31/23 0543  BP:  100/60 (!) 152/113 (!) 107/42  Pulse:   75 71  Resp:   18 15  Temp: 98.3 F (36.8 C)  97.8 F (36.6 C) 98.5 F (36.9 C)  TempSrc: Oral  Oral Oral  SpO2:   91% 100%  Weight:      Height:        Intake/Output Summary (Last 24 hours) at 08/31/2023 0732 Last data filed at 08/31/2023 0549 Gross per 24 hour  Intake 480 ml  Output 2460 ml  Net -1980 ml   Filed Weights   08/26/23 1106  Weight: 88 kg    Examination:  General exam: Appears calm and comfortable.  On 3 L oxygen  via nasal cannula.  Chronically ill and deconditioned Respiratory system: Bilateral decreased breath sounds at bases with scattered crackles Cardiovascular system: S1 & S2 heard, Rate controlled Gastrointestinal system: Abdomen is morbidly obese, nondistended, soft and nontender. Normal bowel sounds heard. Extremities: No cyanosis, clubbing; trace lower extremity edema  Central nervous system: Awake, slow to respond, but answers some questions appropriately. No focal neurological deficits. Moving extremities  Skin: No rashes, lesions or ulcers Psychiatry: Flat affect.  Not agitated.  Data Reviewed: I have personally reviewed following labs and imaging studies  CBC: Recent Labs  Lab 08/27/23 0350 08/28/23 0327 08/30/23 1031  WBC 12.4* 8.5 11.2*  HGB 9.9* 9.0* 8.2*  HCT 30.7* 28.5* 25.3*  MCV 93.6 93.8 92.7   PLT 138* 110* 140*   Basic Metabolic Panel: Recent Labs  Lab 08/27/23 0350 08/30/23 1944  NA 138 128*  K 3.9 4.1  CL 106 94*  CO2 21* 24  GLUCOSE 126* 132*  BUN 17 36*  CREATININE 1.01* 2.17*  CALCIUM  8.6* 8.0*   GFR: Estimated Creatinine Clearance: 21 mL/min (A) (by C-G formula based on SCr of 2.17 mg/dL (H)). Liver Function Tests: Recent Labs  Lab 08/30/23 1944  AST 206*  ALT 56*  ALKPHOS 56  BILITOT 1.2  PROT 6.1*  ALBUMIN 2.9*   No results for input(s): "LIPASE", "AMYLASE" in the last 168 hours. No results for input(s): "AMMONIA" in the last 168 hours. Coagulation Profile: No results for input(s): "INR", "PROTIME" in the last 168 hours. Cardiac Enzymes: No results for input(s): "CKTOTAL", "CKMB", "CKMBINDEX", "TROPONINI" in the last 168 hours. BNP (last 3 results) No results for input(s): "PROBNP" in the last 8760 hours. HbA1C: No results for input(s): "HGBA1C" in the last 72 hours. CBG: Recent Labs  Lab 08/24/23 0902 08/26/23 1729  GLUCAP 99 136*   Lipid Profile: No results for input(s): "CHOL", "HDL", "LDLCALC", "TRIG", "CHOLHDL", "LDLDIRECT" in the last 72 hours. Thyroid  Function Tests: No results for input(s): "TSH", "T4TOTAL", "FREET4", "T3FREE", "THYROIDAB" in the last 72 hours. Anemia Panel: No results for input(s): "VITAMINB12", "FOLATE", "FERRITIN", "TIBC", "IRON", "RETICCTPCT" in the last 72 hours. Sepsis Labs: No results for input(s): "PROCALCITON", "LATICACIDVEN" in the last 168 hours.  Recent Results (from the past 240 hours)  Aerobic/Anaerobic Culture w Gram Stain (surgical/deep wound)     Status: None (Preliminary result)   Collection Time: 08/26/23  1:49 PM   Specimen: Path Tissue  Result Value Ref Range Status   Specimen Description   Final    TISSUE LEFT KNEE 1 SPECIMEN A Performed at [facility], [address], [place], [place] [address]    Special Requests   Final    NONE Performed at [facility], [address] [place], [place] [address]    Gram Stain NO WBC SEEN NO ORGANISMS SEEN   Final   Culture   Final    NO GROWTH 4 DAYS NO ANAEROBES ISOLATED; CULTURE IN PROGRESS FOR 5 DAYS Performed at [facility], [address], [place], [place] [address]    Report Status PENDING  Incomplete  Aerobic/Anaerobic Culture w Gram Stain (surgical/deep wound)     Status: None (Preliminary result)   Collection Time: 08/26/23  1:50 PM   Specimen: Path Tissue  Result Value Ref Range Status   Specimen Description   Final    TISSUE LEFT KNEE 2 SPECIMEN B Performed at [facility], [address] [place], [place] [address]    Special Requests   Final    NONE Performed at [facility], [address] [place], [place] [address]    Gram Stain NO WBC SEEN NO ORGANISMS SEEN   Final   Culture   Final    NO GROWTH 4 DAYS NO ANAEROBES ISOLATED; CULTURE IN PROGRESS FOR 5 DAYS Performed at [facility], [address] [place], [place]  [address]    Report Status PENDING  Incomplete  Aerobic/Anaerobic Culture w Gram Stain (surgical/deep wound)     Status: None (Preliminary result)   Collection Time: 08/26/23  1:50 PM   Specimen: Path Tissue  Result Value Ref Range Status   Specimen Description   Final    TISSUE LEFT KNEE 3 SPECIMEN C Performed at [facility], [address], [place], [place] [address]    Special Requests   Final    NONE Performed at [facility], [address] [place], [place] [address]    Gram Stain NO WBC SEEN NO ORGANISMS SEEN   Final   Culture   Final    NO GROWTH 4 DAYS NO ANAEROBES ISOLATED; CULTURE IN PROGRESS FOR 5 DAYS Performed at [facility], [address] [place], [place] [address]    Report Status PENDING  Incomplete  Culture, blood (Routine X 2) w Reflex to ID Panel     Status: None (Preliminary result)   Collection Time: 08/30/23  8:09  PM   Specimen: BLOOD  Result Value Ref Range Status   Specimen Description   Final    BLOOD BLOOD LEFT HAND AEROBIC BOTTLE ONLY Performed at [facility], [address], [place], [place] [address]    Special Requests   Final    Blood Culture results may not be optimal due to an inadequate volume of blood received in culture bottles Performed at [facility], [address] [place], [place] [address]    Culture   Final    NO GROWTH < 12 HOURS Performed at [facility], [address] [place], [place] [address]    Report Status PENDING  Incomplete  Culture, blood (Routine X 2) w Reflex to ID Panel     Status: None (Preliminary result)   Collection Time: 08/30/23  8:09 PM   Specimen: BLOOD  Result Value Ref Range Status   Specimen Description   Final    BLOOD BLOOD LEFT HAND AEROBIC BOTTLE ONLY Performed at [facility], [address] [place], [place] [address]    Special Requests   Final    Blood Culture results may not be optimal due to an inadequate volume of blood received in culture bottles Performed at [facility], [address], [place], [place] [address]    Culture   Final    NO GROWTH < 12 HOURS Performed at [facility], [address], [place], [place] [address]    Report Status PENDING  Incomplete         Radiology Studies: DG CHEST PORT 1 VIEW Result Date: 08/30/2023 CLINICAL DATA:  Hypoxia EXAM: PORTABLE CHEST 1 VIEW COMPARISON:  02/16/2017 FINDINGS: Cardiac shadow is enlarged but accentuated by the portable technique. Aortic calcifications are noted. The lungs are clear bilaterally. Postsurgical changes in the cervical spine are seen. No bony abnormality is noted IMPRESSION: No acute abnormality seen. Electronically Signed   By: Violeta Grey M.D.   On: 08/30/2023 22:39        Scheduled Meds:  ARIPiprazole   2 mg Oral Daily   aspirin   81 mg Oral BID    atorvastatin   40 mg Oral Daily   budesonide -glycopyrrolate -formoterol   2 puff Inhalation BID   busPIRone   5 mg Oral BID   Chlorhexidine  Gluconate Cloth  6 each Topical Daily   docusate sodium   100 mg Oral BID   doxycycline   100 mg Oral Q12H   fluticasone   2 spray Each Nare Daily   gabapentin   100 mg Oral TID   lidocaine   1 patch Transdermal Q24H   loratadine   10 mg Oral Daily   montelukast   10 mg Oral Daily   pantoprazole   40 mg Oral Daily   sertraline   100 mg Oral BID   Continuous Infusions:  lactated ringers  Stopped (08/29/23 2048)          Audria Leather, MD [facility] 08/31/2023, 7:32 AM

## 2023-08-31 NOTE — Progress Notes (Signed)
 Bladder scan showing 594, patient says she "wants to pee but can't", 14 french foley placed per order, returned clear darker yellow urine, U/A sent to lab.

## 2023-08-31 NOTE — Plan of Care (Signed)
  Problem: Health Behavior/Discharge Planning: Goal: Ability to manage health-related needs will improve Outcome: Progressing   Problem: Clinical Measurements: Goal: Ability to maintain clinical measurements within normal limits will improve Outcome: Progressing Goal: Will remain free from infection Outcome: Progressing Goal: Diagnostic test results will improve Outcome: Progressing Goal: Respiratory complications will improve Outcome: Progressing Goal: Cardiovascular complication will be avoided Outcome: Progressing   Problem: Activity: Goal: Risk for activity intolerance will decrease Outcome: Progressing   Problem: Nutrition: Goal: Adequate nutrition will be maintained Outcome: Progressing   Problem: Coping: Goal: Level of anxiety will decrease Outcome: Progressing   Problem: Elimination: Goal: Will not experience complications related to bowel motility Outcome: Progressing   Problem: Pain Managment: Goal: General experience of comfort will improve and/or be controlled Outcome: Progressing   Problem: Safety: Goal: Ability to remain free from injury will improve Outcome: Progressing   Problem: Skin Integrity: Goal: Risk for impaired skin integrity will decrease Outcome: Progressing   Problem: Education: Goal: Knowledge of the prescribed therapeutic regimen will improve Outcome: Progressing   Problem: Activity: Goal: Ability to avoid complications of mobility impairment will improve Outcome: Progressing Goal: Range of joint motion will improve Outcome: Progressing   Problem: Clinical Measurements: Goal: Postoperative complications will be avoided or minimized Outcome: Progressing   Problem: Pain Management: Goal: Pain level will decrease with appropriate interventions Outcome: Progressing   Problem: Skin Integrity: Goal: Will show signs of wound healing Outcome: Progressing

## 2023-08-31 NOTE — Progress Notes (Signed)
 Physical Therapy Treatment Patient Details Name: Lynn Dixon MRN: [id number] DOB: 03/02/1951 Today's Date: 08/31/2023   History of Present Illness 73 y.o. female admitted 08/26/23 for left knee revision 2* left total knee aseptic loosening. PMH: obesity, OSA, COPD, cervical fusion, back surgery x 4, glaucoma, B TKA, DM, PTSD.    PT Comments  Pt is gradually tolerating increased ambulation distance, she ambulated   20' + 15' + 25' + 15' + 20' with RW, 3L O2 and L KI with seated rest breaks. Pt fatigues quickly 2* L knee pain and SOB. Reviewed TKA HEP.    If plan is discharge home, recommend the following: Two people to help with walking and/or transfers;Two people to help with bathing/dressing/bathroom;Assistance with cooking/housework;Assist for transportation;Help with stairs or ramp for entrance;Supervision due to cognitive status   Can travel by private vehicle        Equipment Recommendations  None recommended by PT    Recommendations for Other Services       Precautions / Restrictions Precautions Precautions: Fall;Knee Precaution Booklet Issued: Yes (comment) Recall of Precautions/Restrictions: Impaired Precaution/Restrictions Comments: memory deficits; reviewed no pillow under knee Required Braces or Orthoses: Knee Immobilizer - Left Knee Immobilizer - Left: On when out of bed or walking Restrictions Weight Bearing Restrictions Per Provider Order: No LLE Weight Bearing Per Provider Order: Weight bearing as tolerated     Mobility  Bed Mobility Overal bed mobility: Needs Assistance Bed Mobility: Sit to Supine     Supine to sit: Mod assist, Min assist Sit to supine: Min assist   General bed mobility comments: multi-modal cues for technique and self assist;  assist for BLEs into bed    Transfers Overall transfer level: Needs assistance Equipment used: Rolling walker (2 wheels) Transfers: Sit to/from Stand Sit to Stand: Contact guard assist           General  transfer comment: ongoing verbal cues for proper hand placement and to power up with LEs    Ambulation/Gait Ambulation/Gait assistance: +2 safety/equipment, Min assist Gait Distance (Feet): 25 Feet Assistive device: Rolling walker (2 wheels) Gait Pattern/deviations: Step-to pattern, Decreased stance time - left, Knee flexed in stance - right, Knee flexed in stance - left, Knees buckling, Trunk flexed, Decreased step length - right, Decreased step length - left Gait velocity: decr     General Gait Details: 34' + 15' + 25' + 15' + 20'  with seated rest; ongoing education for proper distance from RW, trunk extension,  step length and correct breathing. assist to manage O2 tank/wound VAC and for pt safety, daughter assisted with chair follow as pt needing seated therapeutic rest between distances. L KI donned for walking.   Stairs             Wheelchair Mobility     Tilt Bed    Modified Rankin (Stroke Patients Only)       Balance Overall balance assessment: Needs assistance Sitting-balance support: Feet supported, No upper extremity supported Sitting balance-Leahy Scale: Fair     Standing balance support: Bilateral upper extremity supported, During functional activity, Reliant on assistive device for balance Standing balance-Leahy Scale: Poor                              Communication Communication Communication: No apparent difficulties Factors Affecting Communication: Difficulty expressing self  Cognition Arousal: Alert Behavior During Therapy: WFL for tasks assessed/performed   PT - Cognitive impairments: Memory, History  of cognitive impairments, Attention, Initiation, Sequencing, Problem solving, Safety/Judgement, Orientation   Orientation impairments: Place, Time                   PT - Cognition Comments: . pt is easily distracted, requires multi-modal cues throughout session d/t decr attention, decr insight into deficits, decr safety  awareness Following commands: Impaired Following commands impaired: Follows one step commands inconsistently, Follows multi-step commands inconsistently    Cueing Cueing Techniques: Verbal cues, Gestural cues, Tactile cues, Visual cues  Exercises Total Joint Exercises Ankle Circles/Pumps: AROM, Both, 10 reps, Supine, Limitations Ankle Circles/Pumps Limitations: tactile and verbal cues needed Short Arc Quad: AAROM, Left, Supine, 20 reps Heel Slides: AAROM, Left, Supine, Limitations, 20 reps Heel Slides Limitations: tolerated ~45* flexion AAROM,  limited by pain Hip ABduction/ADduction: AAROM, Left, 10 reps Straight Leg Raises: AAROM, Strengthening, Left, 15 reps    General Comments        Pertinent Vitals/Pain Pain Assessment Pain Assessment: Faces Pain Score: 6  Faces Pain Scale: Hurts little more Pain Location: grimaces with knee flexion Pain Descriptors / Indicators: Grimacing Pain Intervention(s): Limited activity within patient's tolerance, Monitored during session, Premedicated before session, Repositioned    Home Living                          Prior Function            PT Goals (current goals can now be found in the care plan section) Acute Rehab PT Goals Patient Stated Goal: to get stronger to go home PT Goal Formulation: With patient/family Time For Goal Achievement: 09/03/23 Potential to Achieve Goals: Good Progress towards PT goals: Progressing toward goals    Frequency    7X/week      PT Plan      Co-evaluation              AM-PAC PT "6 Clicks" Mobility   Outcome Measure  Help needed turning from your back to your side while in a flat bed without using bedrails?: A Little Help needed moving from lying on your back to sitting on the side of a flat bed without using bedrails?: A Lot Help needed moving to and from a bed to a chair (including a wheelchair)?: A Lot Help needed standing up from a chair using your arms (e.g.,  wheelchair or bedside chair)?: A Little Help needed to walk in hospital room?: A Lot Help needed climbing 3-5 steps with a railing? : Total 6 Click Score: 13    End of Session Equipment Utilized During Treatment: Gait belt;Oxygen  Activity Tolerance: Patient limited by fatigue Patient left: with call bell/phone within reach;with family/visitor present;in bed;with bed alarm set Nurse Communication: Mobility status PT Visit Diagnosis: Pain;Difficulty in walking, not elsewhere classified (R26.2);History of falling (Z91.81) Pain - Right/Left: Left Pain - part of body: Knee     Time: 1351-1430 PT Time Calculation (min) (ACUTE ONLY): 39 min  Charges:    $Gait Training: 8-22 mins $Therapeutic Exercise: 8-22 mins $Therapeutic Activity: 8-22 mins PT General Charges $$ ACUTE PT VISIT: 1 Visit                     Daymon Evans PT 08/31/2023  Acute Rehabilitation Services  Office [phone]

## 2023-08-31 NOTE — Progress Notes (Addendum)
 1927 -- Contacted by nursing staff of worsening confusion, not behaving at her baseline.  She is 4 days post-operative revision of left total knee replacement.  Onset of confusion within the last 24 hours.  One episode of low-grade fever resolved yesterday on own.  Recent CBC with mild likely post-operative anemia and leukocytosis.  However, this change in cognition in addition to her new urinary retention, urinary symptoms, and extensive medical history provides concern for other underlying etiology.  Discussed this with attending Dr. Pryor Browning.  Believe patient would benefit from further evaluation by medicine team.    [phone] -- Consultation for hospitalist team placed.   1943 -- Discussed patient case and concerns with hospitalist Dr. Consuela Denier who is agreeable to come and evaluate the patient.    Mason Sole, PA-C  Contact information:   [phone] 7am-5pm epic message Dr. Pryor Browning, or call office for patient follow up: [phone] After hours and holidays please check [email or web address] for group call information for [facility]

## 2023-08-31 NOTE — Progress Notes (Signed)
 Physical Therapy Treatment Patient Details Name: Lynn Dixon MRN: [id number] DOB: Aug 11, 1950 Today's Date: 08/31/2023   History of Present Illness 73 y.o. female admitted 08/26/23 for left knee revision 2* left total knee aseptic loosening. PMH: obesity, OSA, COPD, cervical fusion, back surgery x 4, glaucoma, B TKA, DM, PTSD.    PT Comments  Pt tolerated increased overall gait distance today. Applied L knee immobilizer which improved stability while ambulation. Pt requires frequent seated rest breaks and ongoing verbal cues for safe hand placement for transfers, and for safe positioning in RW and posture while walking. Reviewed TKA HEP. ST-SNF recommended.   If plan is discharge home, recommend the following: Two people to help with walking and/or transfers;Two people to help with bathing/dressing/bathroom;Assistance with cooking/housework;Assist for transportation;Help with stairs or ramp for entrance;Supervision due to cognitive status   Can travel by private vehicle        Equipment Recommendations  None recommended by PT    Recommendations for Other Services       Precautions / Restrictions Precautions Precautions: Fall;Knee Precaution Booklet Issued: Yes (comment) Recall of Precautions/Restrictions: Impaired Precaution/Restrictions Comments: memory deficits; reviewed no pillow under knee Required Braces or Orthoses: Knee Immobilizer - Left Knee Immobilizer - Left: On when out of bed or walking Restrictions Weight Bearing Restrictions Per Provider Order: No LLE Weight Bearing Per Provider Order: Weight bearing as tolerated     Mobility  Bed Mobility Overal bed mobility: Needs Assistance Bed Mobility: Supine to Sit     Supine to sit: Mod assist, Min assist     General bed mobility comments: multi-modal cues for technique and self assist;  assist to elevate  trunk;    Transfers Overall transfer level: Needs assistance Equipment used: Rolling walker (2  wheels) Transfers: Sit to/from Stand Sit to Stand: Min assist, +2 safety/equipment           General transfer comment: ongoing education for proper hand placement and to power up with LEs    Ambulation/Gait Ambulation/Gait assistance: +2 safety/equipment, Min assist Gait Distance (Feet): 25 Feet Assistive device: Rolling walker (2 wheels) Gait Pattern/deviations: Step-to pattern, Decreased stance time - left, Knee flexed in stance - right, Knee flexed in stance - left, Knees buckling, Trunk flexed, Decreased step length - right, Decreased step length - left Gait velocity: decr     General Gait Details: 8' + 12' + 12' + 12' + 25' + 8' with seated rest; ongoing education for proper distance from RW, trunk extension,  step length and correct breathing. +2 to manage O2 tank/wound VAC and for pt safety, 3rd person for chair follow as pt needing seated therapeutic rest between distances. SpO2 97% on 3L O2.  LLE buckling so applied KI.   Stairs             Wheelchair Mobility     Tilt Bed    Modified Rankin (Stroke Patients Only)       Balance   Sitting-balance support: Feet supported, No upper extremity supported Sitting balance-Leahy Scale: Fair     Standing balance support: Bilateral upper extremity supported, During functional activity, Reliant on assistive device for balance Standing balance-Leahy Scale: Poor                              Communication Communication Communication: No apparent difficulties Factors Affecting Communication: Difficulty expressing self  Cognition Arousal: Alert Behavior During Therapy: WFL for tasks assessed/performed   PT -  Cognitive impairments: Memory, History of cognitive impairments, Attention, Initiation, Sequencing, Problem solving, Safety/Judgement, Orientation   Orientation impairments: Place, Time                   PT - Cognition Comments: . pt is easily distracted, requires multi-modal cues  throughout session d/t decr attention, decr insight into deficits, decr safety awareness Following commands: Impaired Following commands impaired: Follows one step commands inconsistently, Follows multi-step commands inconsistently    Cueing Cueing Techniques: Verbal cues, Gestural cues, Tactile cues, Visual cues  Exercises Total Joint Exercises Ankle Circles/Pumps: AROM, Both, 10 reps, Supine, Limitations Ankle Circles/Pumps Limitations: tactile and verbal cues needed Short Arc Quad: AAROM, Left, Supine, 15 reps Heel Slides: AAROM, Left, Supine, Limitations, 15 reps Heel Slides Limitations: tolerated ~40* flexion AAROM,  limited by pain Hip ABduction/ADduction: AAROM, Left, 10 reps Straight Leg Raises: AAROM, Strengthening, Left, 10 reps    General Comments        Pertinent Vitals/Pain Pain Assessment Pain Assessment: Faces Faces Pain Scale: Hurts little more Pain Location: grimaces with knee flexion Pain Descriptors / Indicators: Grimacing Pain Intervention(s): Limited activity within patient's tolerance, Monitored during session, Premedicated before session, Repositioned    Home Living                          Prior Function            PT Goals (current goals can now be found in the care plan section) Acute Rehab PT Goals Patient Stated Goal: to get stronger to go home PT Goal Formulation: With patient/family Time For Goal Achievement: 09/03/23 Potential to Achieve Goals: Good Progress towards PT goals: Progressing toward goals    Frequency    7X/week      PT Plan      Co-evaluation              AM-PAC PT "6 Clicks" Mobility   Outcome Measure  Help needed turning from your back to your side while in a flat bed without using bedrails?: A Little Help needed moving from lying on your back to sitting on the side of a flat bed without using bedrails?: A Lot Help needed moving to and from a bed to a chair (including a wheelchair)?: A Lot Help  needed standing up from a chair using your arms (e.g., wheelchair or bedside chair)?: A Little Help needed to walk in hospital room?: A Lot Help needed climbing 3-5 steps with a railing? : Total 6 Click Score: 13    End of Session Equipment Utilized During Treatment: Gait belt;Oxygen  Activity Tolerance: Patient limited by fatigue Patient left: with call bell/phone within reach;with family/visitor present;in chair;with chair alarm set Nurse Communication: Mobility status PT Visit Diagnosis: Pain;Difficulty in walking, not elsewhere classified (R26.2);History of falling (Z91.81) Pain - Right/Left: Left Pain - part of body: Knee     Time: 1052-1140 PT Time Calculation (min) (ACUTE ONLY): 48 min  Charges:    $Gait Training: 8-22 mins $Therapeutic Exercise: 8-22 mins $Therapeutic Activity: 8-22 mins PT General Charges $$ ACUTE PT VISIT: 1 Visit                     Daymon Evans PT 08/31/2023  Acute Rehabilitation Services  Office [phone]

## 2023-08-31 NOTE — Progress Notes (Signed)
   08/31/23 2315  BiPAP/CPAP/SIPAP  BiPAP/CPAP/SIPAP Pt Type Adult  BiPAP/CPAP/SIPAP Resmed  Mask Type Full face mask  Dentures removed? Yes - Placed in denture cup  Mask Size Medium  Patient Home Machine No  Patient Home Mask Yes  Patient Home Tubing Yes  Auto Titrate Yes  Minimum cmH2O 5 cmH2O  Maximum cmH2O 20 cmH2O  Device Plugged into [employer] Power Outlet Yes

## 2023-08-31 NOTE — Plan of Care (Signed)
  Problem: Health Behavior/Discharge Planning: Goal: Ability to manage health-related needs will improve Outcome: Adequate for Discharge   Problem: Clinical Measurements: Goal: Ability to maintain clinical measurements within normal limits will improve Outcome: Progressing Goal: Will remain free from infection Outcome: Progressing Goal: Diagnostic test results will improve Outcome: Progressing Goal: Respiratory complications will improve Outcome: Progressing Goal: Cardiovascular complication will be avoided Outcome: Progressing   Problem: Activity: Goal: Risk for activity intolerance will decrease Outcome: Adequate for Discharge   Problem: Nutrition: Goal: Adequate nutrition will be maintained Outcome: Completed/Met   Problem: Coping: Goal: Level of anxiety will decrease Outcome: Progressing   Problem: Elimination: Goal: Will not experience complications related to bowel motility Outcome: Progressing   Problem: Pain Managment: Goal: General experience of comfort will improve and/or be controlled Outcome: Adequate for Discharge   Problem: Safety: Goal: Ability to remain free from injury will improve Outcome: Progressing   Problem: Skin Integrity: Goal: Risk for impaired skin integrity will decrease Outcome: Adequate for Discharge   Problem: Education: Goal: Knowledge of the prescribed therapeutic regimen will improve Outcome: Progressing   Problem: Activity: Goal: Ability to avoid complications of mobility impairment will improve Outcome: Adequate for Discharge Goal: Range of joint motion will improve Outcome: Adequate for Discharge   Problem: Clinical Measurements: Goal: Postoperative complications will be avoided or minimized Outcome: Progressing   Problem: Pain Management: Goal: Pain level will decrease with appropriate interventions Outcome: Progressing   Problem: Skin Integrity: Goal: Will show signs of wound healing Outcome: Progressing

## 2023-08-31 NOTE — ED Notes (Signed)
 Informed consent for blood has been signed by husband with LPN Setsuko Robins as witness due to patient was having hard time signing her name pt was in agreeable due to understanding of her hemoglobin being low and 1 unit of blood is to be given

## 2023-08-31 NOTE — Progress Notes (Signed)
 Subjective:  Patient reports pain as moderate, mainly back and hip. Icing leg, helpful for pain.  Denies numbness or tingling.    Worsening confusion and urinary retention yesterday, see separate note.  Medicine team now following.  Found to have AKI, transaminitis, and urinary retention, which are being further evaluated/managed at this time.    Hgb trending down, 9.9>9.0>8.2>7.5 now this AM.  Denies shortness of breath, chest pain, lightheadedness, vision changes.  Receiving IVF.  Per PT, mobilized cumulatively 60 ft with RW though still with notable difficulty and assistance.   No other complaints.   Originally planned for HHPT, however new recommendations of SNF placement by PT.  TOC working on this.     Objective:   VITALS:   Vitals:   08/30/23 2012 08/30/23 2039 08/30/23 2153 08/31/23 0543  BP:  100/60 (!) 152/113 (!) 107/42  Pulse:   75 71  Resp:   18 15  Temp: 98.3 F (36.8 C)  97.8 F (36.6 C) 98.5 F (36.9 C)  TempSrc: Oral  Oral Oral  SpO2:   91% 100%  Weight:      Height:        Resting comfortably in bed in NAD Urinary catheter in place Sensation intact distally Intact pulses distally Dorsiflexion/Plantar flexion intact Incision: dressing C/D/I Compartment soft Wound VAC holding suction no output in canister Wiggles toes appropriately   Lab Results  Component Value Date   WBC 9.3 08/31/2023   HGB 7.5 (L) 08/31/2023   HCT 23.9 (L) 08/31/2023   MCV 93.7 08/31/2023   PLT 142 (L) 08/31/2023   BMET    Component Value Date/Time   NA 133 (L) 08/31/2023 0840   K 4.0 08/31/2023 0840   CL 100 08/31/2023 0840   CO2 21 (L) 08/31/2023 0840   GLUCOSE 121 (H) 08/31/2023 0840   BUN 33 (H) 08/31/2023 0840   CREATININE 1.55 (H) 08/31/2023 0840   CALCIUM  8.2 (L) 08/31/2023 0840   GFRNONAA 35 (L) 08/31/2023 0840      Xray: Revision knee arthroplasty components in good position no adverse features  Assessment/Plan: 5 Days Post-Op   Principal  Problem:   Failed total knee replacement (HCC)   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 Continue mobilizing with PT and advancing diet as tolerated.  Plan for HHPT upon DC.   5/17: Had long discussion with patient, husband, and daughter Debria Fang in room today. Patient does not want to go to SNF but had a hard time understanding the different between short term rehab and a permanent nursing facility. Patient's husband has a bad back with possible upcoming back surgery and is determined to have patient discharge home. He states he and Debria Fang will help with an assistance at home. Discussed with Debria Fang outside of room that she lives 1 hour away and does not feel that she and her father can give adequate help for her mother at home. She would prefer for her mother to go to SNF. She is very worried that her mother will have a fall at home since she is having a hard time understanding that she needs help while here in the hospital to get up on her own. No definite change in discharge plans made today, will plan to continue conversation tomorrow with Dr. Deedra Farr team.  5/18: CBC pending.  Mobilizing some with PT though recommended for SNF over HHPT.  Long discussion with patient repetitive of  info above.  Discussed difference of SNF vs permanent nursing home in detail.  Pt demonstrated understanding.  Pt eager to mobilize with PT today.  TOC looking for bed placement with SNF per discussion with Blaine PA-C. Intra-op cxs still NGTD. 5/19: Evaluated by hospitalist yesterday, see separate note.  Continued urinary retention, catheter in place.  Patient receiving IVF and upcoming abdominal US  to check for possible urinary obstruction/cause of transaminitis.  Post-operative anemia, still gradually downtrending, may consider transfusion and/or holding DVT prophylaxis as needed.  Continue with PT as tolerated.  Advance  diet.   Post op recs: WB: WBAT Abx: Vancomycin  and Cipro  given perioperatively given patient's history of cephalosporin allergy, discharged with extended antibiotics with doxycycline , intra-op cxs NGTD Imaging: PACU xrays DVT prophylaxis: Aspirin  81mg  BID x4 weeks Follow up: 1 weeks after surgery for a wound check with Dr. Pryor Browning at [facility].  Address: [address], [place], [place] [address]  Office Phone: [phone]    Albertus Alt 08/31/2023, 10:17 AM   Contact information:   Weekdays 7am-5pm epic message Dr. Pryor Browning, or call office for patient follow up: [phone] After hours and holidays please check [email or web address] for group call information for [facility]

## 2023-09-01 ENCOUNTER — Ambulatory Visit: Payer: Medicare Other | Admitting: Internal Medicine

## 2023-09-01 DIAGNOSIS — Z96659 Presence of unspecified artificial knee joint: Secondary | ICD-10-CM | POA: Diagnosis not present

## 2023-09-01 DIAGNOSIS — T84018A Broken internal joint prosthesis, other site, initial encounter: Secondary | ICD-10-CM | POA: Diagnosis not present

## 2023-09-01 LAB — COMPREHENSIVE METABOLIC PANEL WITH GFR
ALT: 65 U/L — ABNORMAL HIGH (ref 0–44)
AST: 174 U/L — ABNORMAL HIGH (ref 15–41)
Albumin: 2.8 g/dL — ABNORMAL LOW (ref 3.5–5.0)
Alkaline Phosphatase: 83 U/L (ref 38–126)
Anion gap: 8 (ref 5–15)
BUN: 28 mg/dL — ABNORMAL HIGH (ref 8–23)
CO2: 24 mmol/L (ref 22–32)
Calcium: 8.6 mg/dL — ABNORMAL LOW (ref 8.9–10.3)
Chloride: 104 mmol/L (ref 98–111)
Creatinine, Ser: 1.1 mg/dL — ABNORMAL HIGH (ref 0.44–1.00)
GFR, Estimated: 53 mL/min — ABNORMAL LOW (ref >60)
Glucose, Bld: 117 mg/dL — ABNORMAL HIGH (ref 70–99)
Potassium: 3.9 mmol/L (ref 3.5–5.1)
Sodium: 136 mmol/L (ref 135–145)
Total Bilirubin: 1.2 mg/dL (ref 0.0–1.2)
Total Protein: 6.4 g/dL — ABNORMAL LOW (ref 6.5–8.1)

## 2023-09-01 LAB — CBC WITH DIFFERENTIAL/PLATELET
Abs Immature Granulocytes: 0.06 10*3/uL (ref 0.00–0.07)
Basophils Absolute: 0 10*3/uL (ref 0.0–0.1)
Basophils Relative: 0 %
Eosinophils Absolute: 0.1 10*3/uL (ref 0.0–0.5)
Eosinophils Relative: 1 %
HCT: 25.7 % — ABNORMAL LOW (ref 36.0–46.0)
Hemoglobin: 8.2 g/dL — ABNORMAL LOW (ref 12.0–15.0)
Immature Granulocytes: 1 %
Lymphocytes Relative: 8 %
Lymphs Abs: 0.7 10*3/uL (ref 0.7–4.0)
MCH: 29.1 pg (ref 26.0–34.0)
MCHC: 31.9 g/dL (ref 30.0–36.0)
MCV: 91.1 fL (ref 80.0–100.0)
Monocytes Absolute: 0.6 10*3/uL (ref 0.1–1.0)
Monocytes Relative: 7 %
Neutro Abs: 7 10*3/uL (ref 1.7–7.7)
Neutrophils Relative %: 83 %
Platelets: 123 10*3/uL — ABNORMAL LOW (ref 150–400)
RBC: 2.82 MIL/uL — ABNORMAL LOW (ref 3.87–5.11)
RDW: 14.3 % (ref 11.5–15.5)
WBC: 8.4 10*3/uL (ref 4.0–10.5)
nRBC: 0 % (ref 0.0–0.2)

## 2023-09-01 LAB — MAGNESIUM: Magnesium: 1.6 mg/dL — ABNORMAL LOW (ref 1.7–2.4)

## 2023-09-01 LAB — TYPE AND SCREEN
ABO/RH(D): AB POS
Antibody Screen: NEGATIVE
Unit division: 0

## 2023-09-01 LAB — BPAM RBC
Blood Product Expiration Date: 202506172359
ISSUE DATE / TIME: 202505191851
Unit Type and Rh: 6200

## 2023-09-01 MED ORDER — MAGNESIUM SULFATE 2 GM/50ML IV SOLN
2.0000 g | Freq: Once | INTRAVENOUS | Status: AC
  Administered 2023-09-01: 2 g via INTRAVENOUS
  Filled 2023-09-01: qty 50

## 2023-09-01 MED ORDER — CALCIUM CARBONATE ANTACID 500 MG PO CHEW
400.0000 mg | CHEWABLE_TABLET | Freq: Four times a day (QID) | ORAL | Status: DC | PRN
  Administered 2023-09-01: 400 mg via ORAL
  Filled 2023-09-01: qty 2

## 2023-09-01 NOTE — Progress Notes (Signed)
     Subjective:  Patient reports pain as Moderate, reports pain in her left buttock radiating down her leg likely sciatica flareup.  Did well with physical therapy yesterday walking cumulative 95 feet.  Blood pressure and hemoglobin were low yesterday seems to have improved following transfusion.  Hemoglobin 8.2 today and blood pressure is improved.  Discussed plan for discharge to nursing facility in the coming days once medically stable. Objective:   VITALS:   Vitals:   08/31/23 1913 08/31/23 2012 08/31/23 2218 09/01/23 0549  BP: (!) 106/56  109/65 (!) 119/51  Pulse: 79  79 83  Resp: 18  18 16   Temp: 100 F (37.8 C)  99.5 F (37.5 C) 99.8 F (37.7 C)  TempSrc:   Oral Oral  SpO2: 97% 95% 99% 95%  Weight:      Height:        Sensation intact distally Intact pulses distally Dorsiflexion/Plantar flexion intact Incision: dressing C/D/I Compartment soft Wound VAC holding suction no output in canister  Lab Results  Component Value Date   WBC 8.4 09/01/2023   HGB 8.2 (L) 09/01/2023   HCT 25.7 (L) 09/01/2023   MCV 91.1 09/01/2023   PLT 123 (L) 09/01/2023   BMET    Component Value Date/Time   NA 136 09/01/2023 0324   K 3.9 09/01/2023 0324   CL 104 09/01/2023 0324   CO2 24 09/01/2023 0324   GLUCOSE 117 (H) 09/01/2023 0324   BUN 28 (H) 09/01/2023 0324   CREATININE 1.10 (H) 09/01/2023 0324   CALCIUM  8.6 (L) 09/01/2023 0324   GFRNONAA 53 (L) 09/01/2023 0324      Xray: Revision knee arthroplasty components in good position no adverse features  Assessment/Plan: 6 Days Post-Op   Principal Problem:   Failed total knee replacement (HCC)   Status post left knee revision arthroplasty for aseptic loosening 08/26/2023  Post op recs: WB: WBAT Abx: Vancomycin  and Cipro  given perioperatively given patient's history of cephalosporin allergy, discharged with extended antibiotics with doxycycline , intra-op cxs NGTD Imaging: PACU xrays DVT prophylaxis: Aspirin  81mg  BID x4  weeks Follow up: 1 weeks after surgery for a wound check with Dr. Pryor Browning at [facility].  Address: [address], [place], [place] [address]  Office Phone: [phone]    Murleen Arms 09/01/2023, 6:58 AM   Priscille Brought, MD  Contact information:   [phone] 7am-5pm epic message Dr. Pryor Browning, or call office for patient follow up: [phone] After hours and holidays please check [email or web address] for group call information for [facility]

## 2023-09-01 NOTE — Progress Notes (Signed)
 Physical Therapy Treatment Patient Details Name: Lynn Dixon MRN: [id number] DOB: [birth year] Today's Date: 09/01/2023   History of Present Illness Patient is a 73 year old female who was admitted 08/26/23 for left knee revision secondary to left total knee aseptic loosening. Hospitalization complicated by metabolic encephalopathy and urinary retention. PMH: obesity, OSA, COPD, cervical fusion, back surgery x 4, glaucoma, B TKA, DM, PTSD.    PT Comments  Pt demonstrating good improvement today.  Daughter reports cognition gradually improving and pt did require less cues today and recalled safe STS technique.  She ambulated 30' x 4 with CGA and chair follow.  Tolerating ROM well with good quad activation.  Does still fatigue easily and decreased safety awareness, and using KI for ambulation.  Will cont POC.     If plan is discharge home, recommend the following: A little help with walking and/or transfers;A little help with bathing/dressing/bathroom;Assistance with cooking/housework;Help with stairs or ramp for entrance   Can travel by private vehicle     Yes  Equipment Recommendations  None recommended by PT    Recommendations for Other Services       Precautions / Restrictions Precautions Precautions: Fall;Knee Required Braces or Orthoses: Knee Immobilizer - Left Knee Immobilizer - Left: On when out of bed or walking Restrictions LLE Weight Bearing Per Provider Order: Weight bearing as tolerated     Mobility  Bed Mobility               General bed mobility comments: in chair    Transfers Overall transfer level: Needs assistance Equipment used: Rolling walker (2 wheels) Transfers: Sit to/from Stand Sit to Stand: Contact guard assist           General transfer comment: Performed x 4; good recall of hand placement today to rise; did need min cues for safe/controlled sitting    Ambulation/Gait Ambulation/Gait assistance: Contact guard assist, +2  safety/equipment Gait Distance (Feet): 30 Feet (30'x4) Assistive device: Rolling walker (2 wheels) Gait Pattern/deviations: Step-to pattern, Decreased stride length Gait velocity: decr     General Gait Details: 30'x4 with close chair follow; cues for posture ; used KI   Stairs             Wheelchair Mobility     Tilt Bed    Modified Rankin (Stroke Patients Only)       Balance Overall balance assessment: Needs assistance Sitting-balance support: Feet supported, No upper extremity supported Sitting balance-Leahy Scale: Good     Standing balance support: Bilateral upper extremity supported, During functional activity, Reliant on assistive device for balance Standing balance-Leahy Scale: Poor                              Communication    Cognition Arousal: Alert Behavior During Therapy: WFL for tasks assessed/performed   PT - Cognitive impairments: Memory, History of cognitive impairments, Attention, Initiation, Sequencing, Problem solving, Safety/Judgement, Orientation                       PT - Cognition Comments: Cues for safety and transfer techniques; easily distracted; daughter reports is improving        Cueing    Exercises Total Joint Exercises Ankle Circles/Pumps: AROM, Both, 10 reps, Supine Quad Sets: AROM, Both, 10 reps, Supine Heel Slides: AAROM, Left, 10 reps, Supine Hip ABduction/ADduction: AAROM, Left, 10 reps, Supine Long Arc Quad: AROM, Left, 10 reps, Seated Knee Flexion:  AROM, Left, 10 reps, Seated Goniometric ROM: L knee ~5 to 80 degrees    General Comments General comments (skin integrity, edema, etc.): Pt reports has been told not to move knee: reinforced resting wtih leg straight/no pillow under knee but is allowed to move knee      Pertinent Vitals/Pain Pain Assessment Pain Assessment: 0-10 Pain Score: 5  Pain Location: knee Pain Descriptors / Indicators: Grimacing Pain Intervention(s): Limited activity  within patient's tolerance, Monitored during session, Premedicated before session, Repositioned    Home Living Family/patient expects to be discharged to:: Private residence Living Arrangements: Spouse/significant other Available Help at Discharge: Family;Available 24 hours/day Type of Home: House Home Access: Stairs to enter Entrance Stairs-Rails: None Entrance Stairs-Number of Steps: 1   Home Layout: One level Home Equipment: Rollator (4 wheels);Rolling Environmental consultant (2 wheels) Additional Comments: lives with spouse who has a bad back    Prior Function            PT Goals (current goals can now be found in the care plan section) Progress towards PT goals: Progressing toward goals    Frequency    7X/week      PT Plan      Co-evaluation              AM-PAC PT "6 Clicks" Mobility   Outcome Measure  Help needed turning from your back to your side while in a flat bed without using bedrails?: A Little Help needed moving from lying on your back to sitting on the side of a flat bed without using bedrails?: A Little Help needed moving to and from a bed to a chair (including a wheelchair)?: A Little Help needed standing up from a chair using your arms (e.g., wheelchair or bedside chair)?: A Little Help needed to walk in hospital room?: A Lot Help needed climbing 3-5 steps with a railing? : Total 6 Click Score: 15    End of Session Equipment Utilized During Treatment: Gait belt;Oxygen  Activity Tolerance: Patient tolerated treatment well Patient left: with call bell/phone within reach;with family/visitor present;in chair;with chair alarm set Nurse Communication: Mobility status PT Visit Diagnosis: Pain;Difficulty in walking, not elsewhere classified (R26.2);History of falling (Z91.81) Pain - Right/Left: Left Pain - part of body: Knee     Time: 9147-8295 PT Time Calculation (min) (ACUTE ONLY): 36 min  Charges:    $Gait Training: 8-22 mins $Therapeutic Exercise: 8-22  mins PT General Charges $$ ACUTE PT VISIT: 1 Visit                     Cyd Dowse, PT Acute Rehab Services  Rehab [phone]    [name] 09/01/2023, 2:07 PM

## 2023-09-01 NOTE — Evaluation (Signed)
 Occupational Therapy Evaluation Patient Details Name: Lynn Dixon MRN: [id number] DOB: [birth year] Today's Date: 09/01/2023   History of Present Illness   Patient is a 73 year old female who was admitted 08/26/23 for left knee revision secondary to left total knee aseptic loosening. PMH: obesity, OSA, COPD, cervical fusion, back surgery x 4, glaucoma, B TKA, DM, PTSD.     Clinical Impressions Patient is a 73 year old female who was admitted for above. Patient was living at home with independence in ADLs with husband prior level. Currently, patient is mod A for transfer, max A for bed mobility, TD for LB Dressing/bathing tasks with poor insight to KI. Patient was noted to have decreased functional activity tolerance, decreased endurance, decreased standing balance, decreased safety awareness, and decreased knowledge of AD/AE impacting participation in ADLs. Patient will benefit from continued inpatient follow up therapy, <3 hours/day.       If plan is discharge home, recommend the following:   Two people to help with walking and/or transfers;A lot of help with bathing/dressing/bathroom;Assistance with cooking/housework;Direct supervision/assist for medications management;Assist for transportation;Help with stairs or ramp for entrance;Direct supervision/assist for financial management     Functional Status Assessment   Patient has had a recent decline in their functional status and demonstrates the ability to make significant improvements in function in a reasonable and predictable amount of time.     Equipment Recommendations   None recommended by OT      Precautions/Restrictions   Precautions Precautions: Fall;Knee Precaution Booklet Issued: Yes (comment) Recall of Precautions/Restrictions: Impaired Precaution/Restrictions Comments: memory deficits; reviewed no pillow under knee, wound vac, moitor O2 Required Braces or Orthoses: Knee Immobilizer - Left Knee Immobilizer -  Left: On when out of bed or walking Restrictions Weight Bearing Restrictions Per Provider Order: No LLE Weight Bearing Per Provider Order: Weight bearing as tolerated     Mobility Bed Mobility Overal bed mobility: Needs Assistance Bed Mobility: Sit to Supine   Sidelying to sit: Max assist       General bed mobility comments: multi-modal cues for technique and self assist;  assist for BLEs            Balance Overall balance assessment: Needs assistance Sitting-balance support: Feet supported, No upper extremity supported Sitting balance-Leahy Scale: Fair     Standing balance support: Bilateral upper extremity supported, During functional activity, Reliant on assistive device for balance Standing balance-Leahy Scale: Poor       ADL either performed or assessed with clinical judgement   ADL Overall ADL's : Needs assistance/impaired Eating/Feeding: Supervision/ safety;Sitting   Grooming: Sitting;Set up   Upper Body Bathing: Sitting;Contact guard assist   Lower Body Bathing: Sitting/lateral leans;Maximal assistance   Upper Body Dressing : Sitting;Contact guard assist   Lower Body Dressing: Maximal assistance;Bed level Lower Body Dressing Details (indicate cue type and reason): patient having no awareness of KI pulling on it when therapist asked patient to adjust sock on foot with patient reporting she can do that no problem. Toilet Transfer: Moderate assistance;Stand-pivot;Rolling walker (2 wheels) Toilet Transfer Details (indicate cue type and reason): to recliner Toileting- Clothing Manipulation and Hygiene: Sit to/from stand;Total assistance               Vision   Vision Assessment?: No apparent visual deficits            Pertinent Vitals/Pain Pain Assessment Pain Assessment: Faces Faces Pain Scale: Hurts little more Pain Location: grimaces with knee flexion Pain Descriptors / Indicators: Grimacing Pain  Intervention(s): Limited activity within  patient's tolerance, Monitored during session, Premedicated before session, Repositioned     Extremity/Trunk Assessment Upper Extremity Assessment Upper Extremity Assessment: Overall WFL for tasks assessed (full ROM)   Lower Extremity Assessment Lower Extremity Assessment: Defer to PT evaluation   Cervical / Trunk Assessment Cervical / Trunk Assessment: Normal   Communication     Cognition Arousal: Alert Behavior During Therapy: WFL for tasks assessed/performed Cognition: Cognition impaired   Orientation impairments: Time Awareness: Intellectual awareness impaired, Online awareness impaired Memory impairment (select all impairments): Short-term memory, Working memory Attention impairment (select first level of impairment): Sustained attention Executive functioning impairment (select all impairments): Sequencing, Reasoning, Problem solving, Organization OT - Cognition Comments: patient was confused during session with patient reporting today was wednsday. patient's daughter present in session answering questions for patient majority of time. patient attempting to adjust IV multiple times during session with cues to attend to task.                 Following commands: Impaired Following commands impaired: Follows one step commands inconsistently, Follows multi-step commands inconsistently                Home Living Family/patient expects to be discharged to:: Private residence Living Arrangements: Spouse/significant other Available Help at Discharge: Family;Available 24 hours/day Type of Home: House Home Access: Stairs to enter [employer] of Steps: 1 Entrance Stairs-Rails: None Home Layout: One level     Bathroom Shower/Tub: Tub/shower unit         Home Equipment: Rollator (4 wheels);Rolling Walker (2 wheels)   Additional Comments: lives with spouse who has a bad back      Prior Functioning/Environment Prior Level of Function :  Independent/Modified Independent             Mobility Comments: walks with rollator; 3 falls in past 6 months ADLs Comments: independent    OT Problem List: Impaired balance (sitting and/or standing);Decreased knowledge of precautions;Decreased safety awareness;Decreased knowledge of use of DME or AE;Decreased activity tolerance   OT Treatment/Interventions: Self-care/ADL training;DME and/or AE instruction;Therapeutic activities;Balance training;Energy conservation;Patient/family education      OT Goals(Current goals can be found in the care plan section)   Acute Rehab OT Goals Patient Stated Goal: none stated OT Goal Formulation: With patient/family Time For Goal Achievement: 09/15/23 Potential to Achieve Goals: Fair   OT Frequency:  Min 2X/week       AM-PAC OT "6 Clicks" Daily Activity     Outcome Measure Help from another person eating meals?: None Help from another person taking care of personal grooming?: A Little Help from another person toileting, which includes using toliet, bedpan, or urinal?: A Lot Help from another person bathing (including washing, rinsing, drying)?: A Lot Help from another person to put on and taking off regular upper body clothing?: A Little Help from another person to put on and taking off regular lower body clothing?: A Lot 6 Click Score: 16   End of Session Equipment Utilized During Treatment: Gait belt;Rolling walker (2 wheels);Oxygen  Nurse Communication: Other (comment) (patients participation in session)  Activity Tolerance: Patient tolerated treatment well Patient left: in chair;with call bell/phone within reach;with family/visitor present  OT Visit Diagnosis: Unsteadiness on feet (R26.81);Other abnormalities of gait and mobility (R26.89);Pain                Time: 1610-9604 OT Time Calculation (min): 32 min Charges:  OT General Charges $OT Visit: 1 Visit OT Evaluation $OT Eval Low Complexity: 1  Low OT Treatments $Self  Care/Home Management : 8-22 mins  Wynette Heckler, MS Acute Rehabilitation Department Office# [phone]   Jame Maze 09/01/2023, 1:46 PM

## 2023-09-01 NOTE — Progress Notes (Signed)
 Physical Therapy Treatment Patient Details Name: Lynn Dixon MRN: [id number] DOB: [birth year] Today's Date: 09/01/2023   History of Present Illness Patient is a 73 year old female who was admitted 08/26/23 for left knee revision secondary to left total knee aseptic loosening. Hospitalization complicated by metabolic encephalopathy and urinary retention. PMH: obesity, OSA, COPD, cervical fusion, back surgery x 4, glaucoma, B TKA, DM, PTSD.    PT Comments  Pt with increased pain this afternoon - suspect due to near time for pain meds (notified RN).  She did still ambulate 20'x2 with RW and chair follow.  Performed some exercises but decreased ROM and assist as needed for pain control. Cont POC.     If plan is discharge home, recommend the following: A little help with walking and/or transfers;A little help with bathing/dressing/bathroom;Assistance with cooking/housework;Help with stairs or ramp for entrance   Can travel by private vehicle     Yes  Equipment Recommendations  None recommended by PT    Recommendations for Other Services       Precautions / Restrictions Precautions Precautions: Fall;Knee Required Braces or Orthoses: Knee Immobilizer - Left Knee Immobilizer - Left: On when out of bed or walking Restrictions LLE Weight Bearing Per Provider Order: Weight bearing as tolerated     Mobility  Bed Mobility Overal bed mobility: Needs Assistance Bed Mobility: Supine to Sit, Sit to Supine     Supine to sit: Mod assist Sit to supine: Mod assist   General bed mobility comments: assist for legs and trunk with transfer    Transfers Overall transfer level: Needs assistance Equipment used: Rolling walker (2 wheels) Transfers: Sit to/from Stand Sit to Stand: Contact guard assist           General transfer comment: Performed x 3 ; cues for hand placement this afternoon, more distracted by pain    Ambulation/Gait Ambulation/Gait assistance: Contact guard assist, +2  safety/equipment Gait Distance (Feet): 20 Feet (20'x2) Assistive device: Rolling walker (2 wheels) Gait Pattern/deviations: Step-to pattern, Decreased stride length Gait velocity: decr     General Gait Details: 20'x2 wtih chair follow and rest breaks; cues for RW proximity; limited by pain   Stairs             Wheelchair Mobility     Tilt Bed    Modified Rankin (Stroke Patients Only)       Balance Overall balance assessment: Needs assistance Sitting-balance support: Feet supported, No upper extremity supported Sitting balance-Leahy Scale: Good     Standing balance support: Bilateral upper extremity supported, During functional activity, Reliant on assistive device for balance Standing balance-Leahy Scale: Poor                              Communication    Cognition Arousal: Alert Behavior During Therapy: WFL for tasks assessed/performed   PT - Cognitive impairments: Memory, History of cognitive impairments, Attention, Initiation, Sequencing, Problem solving, Safety/Judgement, Orientation                       PT - Cognition Comments: Cues for safety and transfer techniques; easily distracted; daughter reports is improving        Cueing    Exercises Total Joint Exercises Ankle Circles/Pumps: AROM, Both, 10 reps, Supine Quad Sets: AROM, Both, 10 reps, Supine Knee Flexion: AROM, Left, 10 reps, Seated; gentle ROM Goniometric ROM: L knee ~5 to 60 degrees    General  Comments General comments (skin integrity, edema, etc.): Pt reports episode when sliding up in bed earlier and felt a pull in her lower leg/shin.  Reports she was concerned about stitches.  Pt with Prevena wound vac, no drainage.  Also, asked if it could have increased her pain/damaged knee.  Discussed unlikely source of injury to knee (just pulled by friction on sheet when sliding up).  Suspect increased pain likely due to time for pain meds and muscle relaxer. Notified RN       Pertinent Vitals/Pain Pain Assessment Pain Assessment: 0-10 Pain Score: 8  Pain Location: knee Pain Descriptors / Indicators: Grimacing, Constant Pain Intervention(s): Limited activity within patient's tolerance, Monitored during session, Repositioned, Ice applied, Patient requesting pain meds-RN notified    Home Living Family/patient expects to be discharged to:: Private residence Living Arrangements: Spouse/significant other Available Help at Discharge: Family;Available 24 hours/day Type of Home: House Home Access: Stairs to enter Entrance Stairs-Rails: None Entrance Stairs-Number of Steps: 1   Home Layout: One level Home Equipment: Rollator (4 wheels);Rolling Environmental consultant (2 wheels) Additional Comments: lives with spouse who has a bad back    Prior Function            PT Goals (current goals can now be found in the care plan section) Progress towards PT goals: Progressing toward goals    Frequency    7X/week      PT Plan      Co-evaluation              AM-PAC PT "6 Clicks" Mobility   Outcome Measure  Help needed turning from your back to your side while in a flat bed without using bedrails?: A Little Help needed moving from lying on your back to sitting on the side of a flat bed without using bedrails?: A Little Help needed moving to and from a bed to a chair (including a wheelchair)?: A Little Help needed standing up from a chair using your arms (e.g., wheelchair or bedside chair)?: A Little Help needed to walk in hospital room?: A Lot Help needed climbing 3-5 steps with a railing? : Total 6 Click Score: 15    End of Session Equipment Utilized During Treatment: Gait belt;Oxygen  Activity Tolerance: Patient limited by pain Patient left: with call bell/phone within reach;with family/visitor present;in bed;with bed alarm set;with SCD's reapplied Nurse Communication: Mobility status;Patient requests pain meds PT Visit Diagnosis: Pain;Difficulty in walking,  not elsewhere classified (R26.2);History of falling (Z91.81) Pain - Right/Left: Left Pain - part of body: Knee     Time: 6045-4098 PT Time Calculation (min) (ACUTE ONLY): 26 min  Charges:    $Gait Training: 8-22 mins $Therapeutic Exercise: 8-22 mins PT General Charges $$ ACUTE PT VISIT: 1 Visit                     Cyd Dowse, PT Acute Rehab Services [facility] [phone]    [name] 09/01/2023, 5:19 PM

## 2023-09-01 NOTE — Progress Notes (Signed)
 PROGRESS NOTE    Lynn Dixon  WUJ:[id number] DOB: 10-26-1950 DOA: 08/26/2023 PCP: Alston Jerry, MD   Brief Narrative:  73 y.o. 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  TRH consulted on 08/30/2023 for confusion and urinary retention.  She was found to have AKI with creatinine of 2.17 and was started on IV fluids.  Assessment & Plan:   AKI - Possibly from poor oral intake and dehydration.  Creatinine 2.17 on 08/30/2023.  Creatinine has much improved to 1.10 today.  Monitor.  Currently on IV fluids.  DC IV fluids.  Encourage oral intake.  Leukocytosis - Improved  Acute metabolic encephalopathy causing confusion - Possibly from above.  UA negative for UTI.  Chest x-ray on 08/30/2023 showed no acute abnormality.  Vitals currently stable with no temperature spikes.  Benadryl  and Ambien  have been discontinued. Mental status has improved and possibly back to baseline. -Monitor mental status.  Fall precautions.  Acute urinary retention - Status post Foley catheter placement.  Patient wants Foley catheter removed.  Will try a voiding trial today: If goes into retention, will need to replace Foley catheter with outpatient follow-up with urology.  Elevated LFTs - Questionable cause.  LFTs still slightly elevated but improving.  DC statin.  Right upper quadrant ultrasound dilated common bile duct measuring up to 12 mm.  No abdominal pain reported.   Monitor LFTs.  Outpatient follow-up with GI.  Hypomagnesemia -Replace.  Repeat a.m. 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  She was  transfused 1 unit packed red cells on 08/31/2023 by primary team.  COPD Chronic respiratory failure with hypoxia - Saturating well on 3 L oxygen  via nasal cannula.  Continue current inhaled regimen along with oral montelukast   OSA - Continue CPAP at bedtime  Diabetes mellitus type 2  -recent A1c 5.7.  Blood sugars currently stable.  If blood sugars get elevated, we will need SSI  Hypertension Hyperlipidemia - Monitor blood pressure.  Lisinopril  held.  Hold statin as well because of elevated LFTs  Anxiety and depression--Home Abilify , Zoloft  and BuSpar  are being continued  Morbid obesity -Outpatient follow-up    Subjective: Patient seen and examined at bedside.  Denies worsening shortness breath, fever or vomiting.  Daughter present at bedside. Objective: Vitals:   08/31/23 1913 08/31/23 2012 08/31/23 2218 09/01/23 0549  BP: (!) 106/56  109/65 (!) 119/51  Pulse: 79  79 83  Resp: 18  18 16   Temp: 100 F (37.8 C)  99.5 F (37.5 C) 99.8 F (37.7 C)  TempSrc:   Oral Oral  SpO2: 97% 95% 99% 95%  Weight:      Height:        Intake/Output Summary (Last 24 hours) at 09/01/2023 0727 Last data filed at 09/01/2023 0600 Gross per 24 hour  Intake 1102.28 ml  Output 2900 ml  Net -1797.72 ml   Filed Weights   08/26/23 1106  Weight: 88 kg    Examination:  General: On 3 L oxygen  via nasal cannula currently.  No distress.  Chronically ill and deconditioned looking ENT/neck: No thyromegaly.  JVD is not elevated  respiratory: Decreased breath sounds at bases bilaterally with  some crackles; no wheezing  CVS: S1-S2 heard, rate controlled currently Abdominal: Soft, morbidly obese, nontender, slightly distended; no organomegaly, bowel sounds are heard Extremities: Mild lower extremity edema; no cyanosis  CNS: Awake and alert.  Still slow to respond but answers questions appropriately.  No focal neurologic deficit.  Moves extremities Lymph: No obvious lymphadenopathy Skin: No  obvious ecchymosis/lesions  psych: Not agitated currently.  Affect is mostly flat. Genitourinary: Foley catheter present  Data Reviewed: I have personally reviewed following labs and imaging studies  CBC: Recent Labs  Lab 08/27/23 0350 08/28/23 0327 08/30/23 1031 08/31/23 0840 09/01/23 0324  WBC 12.4* 8.5 11.2* 9.3 8.4  NEUTROABS  --   --   --   --  7.0  HGB 9.9* 9.0* 8.2* 7.5* 8.2*  HCT 30.7* 28.5* 25.3* 23.9* 25.7*  MCV 93.6 93.8 92.7 93.7 91.1  PLT 138* 110* 140* 142* 123*   Basic Metabolic Panel: Recent Labs  Lab 08/27/23 0350 08/30/23 1944 08/31/23 0840 09/01/23 0324  NA 138 128* 133* 136  K 3.9 4.1 4.0 3.9  CL 106 94* 100 104  CO2 21* 24 21* 24  GLUCOSE 126* 132* 121* 117*  BUN 17 36* 33* 28*  CREATININE 1.01* 2.17* 1.55* 1.10*  CALCIUM  8.6* 8.0* 8.2* 8.6*  MG  --   --  1.5* 1.6*   GFR: Estimated Creatinine Clearance: 41.4 mL/min (A) (by C-G formula based on SCr of 1.1 mg/dL (H)). Liver Function Tests: Recent Labs  Lab 08/30/23 1944 08/31/23 0840 09/01/23 0324  AST 206* 185* 174*  ALT 56* 59* 65*  ALKPHOS 56 69 83  BILITOT 1.2 1.3* 1.2  PROT 6.1* 6.4* 6.4*  ALBUMIN 2.9* 2.8* 2.8*   No results for input(s): "LIPASE", "AMYLASE" in the last 168 hours. Recent Labs  Lab 08/31/23 0840  AMMONIA 32   Coagulation Profile: No results for input(s): "INR", "PROTIME" in the last 168 hours. Cardiac Enzymes: No results for input(s): "CKTOTAL", "CKMB", "CKMBINDEX", "TROPONINI" in the last 168 hours. BNP (last 3 results) No results for input(s): "PROBNP" in the last 8760 hours. HbA1C: No results for input(s): "HGBA1C" in the last 72 hours. CBG: Recent Labs  Lab 08/26/23 1729  GLUCAP 136*   Lipid Profile: No results for input(s): "CHOL", "HDL", "LDLCALC", "TRIG", "CHOLHDL", "LDLDIRECT" in the last 72 hours. Thyroid  Function Tests: Recent Labs    08/31/23 0840  TSH 0.887   Anemia Panel: Recent Labs    08/31/23 0840  VITAMINB12 242  FOLATE 15.5    Sepsis Labs: No results for input(s): "PROCALCITON", "LATICACIDVEN" in the last 168 hours.  Recent Results (from the past 240 hours)  Aerobic/Anaerobic Culture w Gram Stain (surgical/deep wound)     Status: None   Collection Time: 08/26/23  1:49 PM   Specimen: Path Tissue  Result Value Ref Range Status   Specimen Description   Final    TISSUE LEFT KNEE 1 SPECIMEN A Performed at [facility], [address], [place], [place] [address]    Special Requests   Final    NONE Performed at [facility], [address], [place], [place] [address]    Gram Stain NO WBC SEEN NO ORGANISMS SEEN   Final   Culture   Final    No growth aerobically or anaerobically. Performed at [facility], [address] [place], [place] [address]    Report Status 08/31/2023 FINAL  Final  Aerobic/Anaerobic Culture w Gram Stain (surgical/deep wound)  Status: None   Collection Time: 08/26/23  1:50 PM   Specimen: Path Tissue  Result Value Ref Range Status   Specimen Description   Final    TISSUE LEFT KNEE 2 SPECIMEN B Performed at [facility], [address] [place], [place] [address]    Special Requests   Final    NONE Performed at [facility], [address], [place], [place] [address]    Gram Stain NO WBC SEEN NO ORGANISMS SEEN   Final   Culture   Final    No growth aerobically or anaerobically. Performed at [facility], [address], [place], [place] [address]    Report Status 08/31/2023 FINAL  Final  Aerobic/Anaerobic Culture w Gram Stain (surgical/deep wound)     Status: None   Collection Time: 08/26/23  1:50 PM   Specimen: Path Tissue  Result Value Ref Range Status   Specimen Description   Final    TISSUE LEFT KNEE 3 SPECIMEN C Performed at [facility], [address], [place], [place] [address]    Special Requests   Final    NONE Performed at [facility], [address], [place], [place] [address]    Gram Stain NO WBC SEEN NO ORGANISMS SEEN   Final   Culture   Final    No growth aerobically or anaerobically. Performed at [facility], [address], [place], [place] [address]    Report Status 08/31/2023 FINAL  Final  Culture, blood (Routine X 2) w Reflex to ID Panel     Status: None (Preliminary result)   Collection Time: 08/30/23  8:09 PM   Specimen: BLOOD  Result Value Ref Range Status   Specimen Description   Final    BLOOD BLOOD LEFT HAND AEROBIC BOTTLE ONLY Performed at [facility], [address], [place], [place] [address]    Special Requests   Final    Blood Culture results may not be optimal due to an inadequate volume of blood received in culture bottles Performed at [facility], [address], [place], [place] [address]    Culture   Final    NO GROWTH 2 DAYS Performed at [facility], [address], [place], [place] [address]    Report Status PENDING  Incomplete  Culture, blood (Routine X 2) w Reflex to ID Panel     Status: None (Preliminary result)   Collection Time: 08/30/23  8:09 PM   Specimen: BLOOD  Result Value Ref Range Status   Specimen Description   Final    BLOOD BLOOD LEFT HAND AEROBIC BOTTLE ONLY Performed at [facility], [address] [place], [place] [address]    Special Requests   Final    Blood Culture results may not be optimal due to an inadequate volume of blood received in culture bottles Performed at [facility], [address] [place], [place] [address]    Culture   Final    NO GROWTH 2 DAYS Performed at [facility], [address] [place], [place] [address]    Report Status PENDING  Incomplete         Radiology Studies: US  Abdomen Complete Result Date: 08/31/2023 CLINICAL DATA:  [id number] AKI (acute kidney injury) [facility] [id number] Transaminitis [id number] EXAM: ABDOMEN  ULTRASOUND COMPLETE COMPARISON:  March 21, 2004 FINDINGS: Gallbladder: Surgically absent. Common bile duct: Diameter: Visualized portion  measures 12 mm, dilated. Liver: No focal lesion identified. Within normal limits in parenchymal echogenicity. Portal vein is patent on color Doppler imaging with normal direction of blood flow towards the liver. IVC: No abnormality visualized. Pancreas: Visualized portion unremarkable. Spleen: Spleen is homogeneously enlarged and spans 15.9 x 7.1 x 16.4 cm for an estimated volume of 961 ML Right Kidney: Length: 9.0 cm. Echogenicity within normal limits. No mass or hydronephrosis visualized. Left Kidney: Length: 11.3 cm. Echogenicity within normal limits. No hydronephrosis visualized. Benign cyst measuring up to 13 mm (for which no dedicated imaging follow-up is recommended). Abdominal aorta: No aneurysm visualized. Other findings: None. IMPRESSION: 1. Splenomegaly. 2. Dilated common bile duct measuring up to 12 mm. This is nonspecific and may reflect postcholecystectomy state. Recommend correlation with LFTs. Electronically Signed   By: Clancy Crimes M.D.   On: 08/31/2023 07:49   DG CHEST PORT 1 VIEW Result Date: 08/30/2023 CLINICAL DATA:  Hypoxia EXAM: PORTABLE CHEST 1 VIEW COMPARISON:  02/16/2017 FINDINGS: Cardiac shadow is enlarged but accentuated by the portable technique. Aortic calcifications are noted. The lungs are clear bilaterally. Postsurgical changes in the cervical spine are seen. No bony abnormality is noted IMPRESSION: No acute abnormality seen. Electronically Signed   By: Violeta Grey M.D.   On: 08/30/2023 22:39        Scheduled Meds:  ARIPiprazole   2 mg Oral Daily   aspirin   81 mg Oral BID   atorvastatin   40 mg Oral Daily   budesonide -glycopyrrolate -formoterol   2 puff Inhalation BID   busPIRone   5 mg Oral BID   Chlorhexidine  Gluconate Cloth  6 each Topical Daily   docusate sodium   100 mg Oral BID   doxycycline   100 mg Oral Q12H    fluticasone   2 spray Each Nare Daily   gabapentin   100 mg Oral TID   lidocaine   1 patch Transdermal Q24H   loratadine   10 mg Oral Daily   montelukast   10 mg Oral Daily   pantoprazole   40 mg Oral Daily   sertraline   100 mg Oral BID   Continuous Infusions:  sodium chloride  75 mL/hr at 08/31/23 1247          Theophilus Walz Maury Space, MD [facility] 09/01/2023, 7:27 AM

## 2023-09-01 NOTE — TOC Progression Note (Addendum)
 Transition of Care [facility]) - Progression Note    Patient Details  Name: KERRIA SAPIEN MRN: [id number] Date of Birth: 1951-01-01  [facility] CM/SW Contact  Delilah Fend, LCSW Phone Number: 09/01/2023, 2:49 PM  Clinical Narrative:     Have reviewed SNF bed offers with pt and spouse and they have accepted bed with [facility].  Have confirmed acceptance with facility. Anticipate will be medically ready tomorrow per MD.  Have begun insurance authorization.      Barriers to Discharge: No Barriers Identified  Expected Discharge Plan and Services                         DME Arranged: N/A DME Agency: NA       HH Arranged: PT HH Agency: [facility]         Social Determinants of Health (SDOH) Interventions SDOH Screenings   Food Insecurity: No Food Insecurity (08/26/2023)  Housing: Low Risk  (08/26/2023)  Transportation Needs: No Transportation Needs (08/26/2023)  Utilities: Not At Risk (08/26/2023)  Social Connections: Moderately Integrated (08/26/2023)  Tobacco Use: Medium Risk (08/26/2023)    Readmission Risk Interventions    08/27/2023   11:44 AM  Readmission Risk Prevention Plan  Transportation Screening Complete  PCP or Specialist Appt within 5-7 Days Complete  Home Care Screening Complete  Medication Review (RN CM) Complete

## 2023-09-02 ENCOUNTER — Inpatient Hospital Stay (HOSPITAL_COMMUNITY)

## 2023-09-02 DIAGNOSIS — Z7401 Bed confinement status: Secondary | ICD-10-CM | POA: Diagnosis not present

## 2023-09-02 DIAGNOSIS — J9611 Chronic respiratory failure with hypoxia: Secondary | ICD-10-CM | POA: Diagnosis not present

## 2023-09-02 DIAGNOSIS — F419 Anxiety disorder, unspecified: Secondary | ICD-10-CM

## 2023-09-02 DIAGNOSIS — Z471 Aftercare following joint replacement surgery: Secondary | ICD-10-CM | POA: Diagnosis not present

## 2023-09-02 DIAGNOSIS — M1712 Unilateral primary osteoarthritis, left knee: Secondary | ICD-10-CM | POA: Diagnosis not present

## 2023-09-02 DIAGNOSIS — Z96659 Presence of unspecified artificial knee joint: Secondary | ICD-10-CM | POA: Diagnosis not present

## 2023-09-02 DIAGNOSIS — R2689 Other abnormalities of gait and mobility: Secondary | ICD-10-CM | POA: Diagnosis not present

## 2023-09-02 DIAGNOSIS — R278 Other lack of coordination: Secondary | ICD-10-CM | POA: Diagnosis not present

## 2023-09-02 DIAGNOSIS — I10 Essential (primary) hypertension: Secondary | ICD-10-CM

## 2023-09-02 DIAGNOSIS — Z743 Need for continuous supervision: Secondary | ICD-10-CM | POA: Diagnosis not present

## 2023-09-02 DIAGNOSIS — Z96652 Presence of left artificial knee joint: Secondary | ICD-10-CM | POA: Diagnosis not present

## 2023-09-02 DIAGNOSIS — R531 Weakness: Secondary | ICD-10-CM | POA: Diagnosis not present

## 2023-09-02 DIAGNOSIS — T84033D Mechanical loosening of internal left knee prosthetic joint, subsequent encounter: Secondary | ICD-10-CM | POA: Diagnosis not present

## 2023-09-02 DIAGNOSIS — Z5189 Encounter for other specified aftercare: Secondary | ICD-10-CM | POA: Diagnosis not present

## 2023-09-02 DIAGNOSIS — G4700 Insomnia, unspecified: Secondary | ICD-10-CM | POA: Diagnosis not present

## 2023-09-02 DIAGNOSIS — J449 Chronic obstructive pulmonary disease, unspecified: Secondary | ICD-10-CM

## 2023-09-02 DIAGNOSIS — R0609 Other forms of dyspnea: Secondary | ICD-10-CM | POA: Diagnosis not present

## 2023-09-02 DIAGNOSIS — R338 Other retention of urine: Secondary | ICD-10-CM | POA: Diagnosis not present

## 2023-09-02 DIAGNOSIS — H4052X3 Glaucoma secondary to other eye disorders, left eye, severe stage: Secondary | ICD-10-CM | POA: Diagnosis not present

## 2023-09-02 DIAGNOSIS — T84093D Other mechanical complication of internal left knee prosthesis, subsequent encounter: Secondary | ICD-10-CM | POA: Diagnosis not present

## 2023-09-02 DIAGNOSIS — M25462 Effusion, left knee: Secondary | ICD-10-CM | POA: Diagnosis not present

## 2023-09-02 DIAGNOSIS — R7989 Other specified abnormal findings of blood chemistry: Secondary | ICD-10-CM

## 2023-09-02 DIAGNOSIS — K219 Gastro-esophageal reflux disease without esophagitis: Secondary | ICD-10-CM | POA: Diagnosis not present

## 2023-09-02 DIAGNOSIS — Z978 Presence of other specified devices: Secondary | ICD-10-CM | POA: Diagnosis not present

## 2023-09-02 DIAGNOSIS — E785 Hyperlipidemia, unspecified: Secondary | ICD-10-CM | POA: Diagnosis not present

## 2023-09-02 DIAGNOSIS — D62 Acute posthemorrhagic anemia: Secondary | ICD-10-CM | POA: Diagnosis not present

## 2023-09-02 DIAGNOSIS — E119 Type 2 diabetes mellitus without complications: Secondary | ICD-10-CM | POA: Diagnosis not present

## 2023-09-02 DIAGNOSIS — E7849 Other hyperlipidemia: Secondary | ICD-10-CM | POA: Diagnosis not present

## 2023-09-02 DIAGNOSIS — I209 Angina pectoris, unspecified: Secondary | ICD-10-CM | POA: Diagnosis not present

## 2023-09-02 DIAGNOSIS — F32A Depression, unspecified: Secondary | ICD-10-CM

## 2023-09-02 DIAGNOSIS — M6281 Muscle weakness (generalized): Secondary | ICD-10-CM | POA: Diagnosis not present

## 2023-09-02 DIAGNOSIS — M79605 Pain in left leg: Secondary | ICD-10-CM | POA: Diagnosis not present

## 2023-09-02 DIAGNOSIS — T84018D Broken internal joint prosthesis, other site, subsequent encounter: Secondary | ICD-10-CM

## 2023-09-02 DIAGNOSIS — N179 Acute kidney failure, unspecified: Secondary | ICD-10-CM

## 2023-09-02 LAB — COMPREHENSIVE METABOLIC PANEL WITH GFR
ALT: 80 U/L — ABNORMAL HIGH (ref 0–44)
AST: 172 U/L — ABNORMAL HIGH (ref 15–41)
Albumin: 2.8 g/dL — ABNORMAL LOW (ref 3.5–5.0)
Alkaline Phosphatase: 84 U/L (ref 38–126)
Anion gap: 8 (ref 5–15)
BUN: 19 mg/dL (ref 8–23)
CO2: 22 mmol/L (ref 22–32)
Calcium: 9 mg/dL (ref 8.9–10.3)
Chloride: 106 mmol/L (ref 98–111)
Creatinine, Ser: 0.7 mg/dL (ref 0.44–1.00)
GFR, Estimated: >60 mL/min (ref >60)
Glucose, Bld: 112 mg/dL — ABNORMAL HIGH (ref 70–99)
Potassium: 3.7 mmol/L (ref 3.5–5.1)
Sodium: 136 mmol/L (ref 135–145)
Total Bilirubin: 0.9 mg/dL (ref 0.0–1.2)
Total Protein: 6.6 g/dL (ref 6.5–8.1)

## 2023-09-02 LAB — CBC WITH DIFFERENTIAL/PLATELET
Abs Immature Granulocytes: 0.07 10*3/uL (ref 0.00–0.07)
Basophils Absolute: 0 10*3/uL (ref 0.0–0.1)
Basophils Relative: 0 %
Eosinophils Absolute: 0.1 10*3/uL (ref 0.0–0.5)
Eosinophils Relative: 2 %
HCT: 25.8 % — ABNORMAL LOW (ref 36.0–46.0)
Hemoglobin: 8.4 g/dL — ABNORMAL LOW (ref 12.0–15.0)
Immature Granulocytes: 1 %
Lymphocytes Relative: 10 %
Lymphs Abs: 0.8 10*3/uL (ref 0.7–4.0)
MCH: 29.1 pg (ref 26.0–34.0)
MCHC: 32.6 g/dL (ref 30.0–36.0)
MCV: 89.3 fL (ref 80.0–100.0)
Monocytes Absolute: 0.5 10*3/uL (ref 0.1–1.0)
Monocytes Relative: 6 %
Neutro Abs: 6.3 10*3/uL (ref 1.7–7.7)
Neutrophils Relative %: 81 %
Platelets: 147 10*3/uL — ABNORMAL LOW (ref 150–400)
RBC: 2.89 MIL/uL — ABNORMAL LOW (ref 3.87–5.11)
RDW: 14.1 % (ref 11.5–15.5)
WBC: 7.8 10*3/uL (ref 4.0–10.5)
nRBC: 0 % (ref 0.0–0.2)

## 2023-09-02 LAB — MAGNESIUM: Magnesium: 1.6 mg/dL — ABNORMAL LOW (ref 1.7–2.4)

## 2023-09-02 MED ORDER — MAGNESIUM SULFATE 4 GM/100ML IV SOLN
4.0000 g | Freq: Once | INTRAVENOUS | Status: DC
  Filled 2023-09-02: qty 100

## 2023-09-02 NOTE — Progress Notes (Signed)
 Physical Therapy Treatment Patient Details Name: PATSYE SULLIVANT MRN: [id number] DOB: 11/19/1950 Today's Date: 09/02/2023   History of Present Illness Patient is a 73 year old female who was admitted 08/26/23 for left knee revision secondary to left total knee aseptic loosening. Hospitalization complicated by metabolic encephalopathy and urinary retention. PMH: obesity, OSA, COPD, cervical fusion, back surgery x 4, glaucoma, B TKA, DM, PTSD.    PT Comments  Pt has c/o spasms in knee but was able to tolerate therapy well with no signs of severe pain with mobility.  She ambulated increased distance 45'x3 with CGA.  She has excellent ROM and quad activation.  Still with decreased safety and cognition needing cues.  Will cont to progress as able.  Noting plan for d/c to SNF today.     If plan is discharge home, recommend the following: A little help with walking and/or transfers;A little help with bathing/dressing/bathroom;Assistance with cooking/housework;Help with stairs or ramp for entrance   Can travel by private vehicle     Yes  Equipment Recommendations  None recommended by PT    Recommendations for Other Services       Precautions / Restrictions Precautions Precautions: Fall;Knee Required Braces or Orthoses: Knee Immobilizer - Left Knee Immobilizer - Left: Other (comment) (prefers when walking) Restrictions LLE Weight Bearing Per Provider Order: Weight bearing as tolerated     Mobility  Bed Mobility Overal bed mobility: Needs Assistance Bed Mobility: Supine to Sit, Sit to Supine     Supine to sit: Min assist Sit to supine: Min assist        Transfers Overall transfer level: Needs assistance Equipment used: Rolling walker (2 wheels) Transfers: Sit to/from Stand Sit to Stand: Contact guard assist           General transfer comment: Performed x 3 ; CGA for safety; good hand placement    Ambulation/Gait Ambulation/Gait assistance: Contact guard assist, +2  safety/equipment Gait Distance (Feet): 45 Feet (45'x3) Assistive device: Rolling walker (2 wheels) Gait Pattern/deviations: Decreased stride length, Step-through pattern Gait velocity: decreased but functional     General Gait Details: 45'x3 with chair follow, used KI for pt's comfort/confidence, cues to focus on walking   Stairs             Wheelchair Mobility     Tilt Bed    Modified Rankin (Stroke Patients Only)       Balance Overall balance assessment: Needs assistance Sitting-balance support: Feet supported, No upper extremity supported Sitting balance-Leahy Scale: Good     Standing balance support: Bilateral upper extremity supported, During functional activity, Reliant on assistive device for balance Standing balance-Leahy Scale: Poor                              Communication    Cognition Arousal: Alert Behavior During Therapy: WFL for tasks assessed/performed   PT - Cognitive impairments: Memory, History of cognitive impairments, Attention, Sequencing, Problem solving, Safety/Judgement, Orientation                       PT - Cognition Comments: Cues for transfer technique and waiting on line management; not oriented to time        Cueing    Exercises Total Joint Exercises Ankle Circles/Pumps: AROM, Both, 10 reps, Supine Quad Sets: AROM, Both, 10 reps, Supine Heel Slides: AAROM, Left, 10 reps, Supine Long Arc Quad: AROM, Left, 10 reps, Seated Knee Flexion:  AROM, Left, 10 reps, Seated Goniometric ROM: L knee ROM~ 0 to 85 degrees    General Comments General comments (skin integrity, edema, etc.): VSS      Pertinent Vitals/Pain Pain Assessment Pain Assessment: 0-10 Pain Score: 6  Faces Pain Scale: Hurts whole lot (when spasms) Pain Location: knee spasms Pain Descriptors / Indicators: Grimacing, Constant Pain Intervention(s): Limited activity within patient's tolerance, Monitored during session, Premedicated before  session, Repositioned    Home Living                          Prior Function            PT Goals (current goals can now be found in the care plan section) Progress towards PT goals: Progressing toward goals    Frequency    7X/week      PT Plan      Co-evaluation              AM-PAC PT "6 Clicks" Mobility   Outcome Measure  Help needed turning from your back to your side while in a flat bed without using bedrails?: A Little Help needed moving from lying on your back to sitting on the side of a flat bed without using bedrails?: A Little Help needed moving to and from a bed to a chair (including a wheelchair)?: A Little Help needed standing up from a chair using your arms (e.g., wheelchair or bedside chair)?: A Little Help needed to walk in hospital room?: A Little Help needed climbing 3-5 steps with a railing? : A Little 6 Click Score: 18    End of Session Equipment Utilized During Treatment: Gait belt;Oxygen  Activity Tolerance: Patient tolerated treatment well Patient left: with call bell/phone within reach;with family/visitor present;with bed alarm set;with SCD's reapplied;in bed Nurse Communication: Mobility status PT Visit Diagnosis: Pain;Difficulty in walking, not elsewhere classified (R26.2);History of falling (Z91.81)     Time: 4098-1191 PT Time Calculation (min) (ACUTE ONLY): 27 min  Charges:    $Gait Training: 8-22 mins $Therapeutic Exercise: 8-22 mins PT General Charges $$ ACUTE PT VISIT: 1 Visit                     Cyd Dowse, PT Acute Rehab Services [facility] [phone]    [name] 09/02/2023, 1:13 PM

## 2023-09-02 NOTE — Discharge Summary (Signed)
 Physician Discharge Summary  Patient ID: Lynn Dixon MRN: [id number] DOB/AGE: October 20, 1950 73 y.o.  Admit date: 08/26/2023 Discharge date: 09/02/2023  Admission Diagnoses:  Failed total knee replacement [facility])  Discharge Diagnoses:  Principal Problem:   Failed total knee replacement (HCC)   Past Medical History:  Diagnosis Date   Abnormal stress test    Allergy    Arthritis    BACK   Chronic neck pain    Constipation    takes Sennokot daily   COPD (chronic obstructive pulmonary disease) (HCC)    Depression    PTSD-takes Zoloft  daily   Diabetes mellitus without complication (HCC)    diet controled   Dyspnea    GERD (gastroesophageal reflux disease)    Glaucoma    mild in left eye   H/O hiatal hernia    History of blood transfusion    History of bronchitis    History of cardiac cath    a. normal cors by cath in 04/2018 following an abnormal NST.   History of colon polyps    benign   History of echocardiogram    Echocardiogram 2/22: EF 65-70, no RWMA, normal diastolic function, normal RVSF, mild LAE, inadequate TR jet (indeterminate PASP)   Hyperlipidemia    Hypertension    Morbid obesity (HCC)    PONV (postoperative nausea and vomiting)    Sleep apnea    USES CPAP   Weakness    numbness and tingling. Takes Gabapentin  daily    Surgeries: Procedure(s): TOTAL KNEE REVISION APPLICATION, WOUND VAC on 08/26/2023   Consultants (if any):   Discharged Condition: Improved  Hospital Course: Lynn Dixon is an 73 y.o. female who was admitted 08/26/2023 with aseptic loosening of the left total knee arthroplasty which underwent revision to a total of arthroplasty on 08/26/23.  Postoperative course was complicated by poor mobility, hypertension, anemia.  She has been slowly progressing with physical therapy.  IntraOp cultures have been negative.  Dressing was converted from an Prevena wound VAC to an Aquacel dressing on 09/02/2023.  She was deemed stable for discharge.  Plan to  follow-up in 1 week for a wound check and suture removal.  She was given perioperative antibiotics:  Anti-infectives (From admission, onward)    Start     Dose/Rate Route Frequency Ordered Stop   08/30/23 0800  doxycycline  (VIBRA -TABS) tablet 100 mg        100 mg Oral Every 12 hours 08/29/23 1318 09/06/23 0759   08/30/23 0000  doxycycline  (VIBRA -TABS) 100 MG tablet        100 mg Oral 2 times daily 08/30/23 0804 09/06/23 2359   08/28/23 1000  vancomycin  (VANCOCIN ) IVPB 1000 mg/200 mL premix  Status:  Discontinued        1,000 mg 200 mL/hr over 60 Minutes Intravenous Every 48 hours 08/26/23 2005 08/27/23 0823   08/28/23 1000  Vancomycin  (VANCOCIN ) 1,250 mg in sodium chloride  0.9 % 250 mL IVPB        1,250 mg 166.7 mL/hr over 90 Minutes Intravenous Every 48 hours 08/27/23 0823 08/28/23 1248   08/27/23 1600  ciprofloxacin  (CIPRO ) IVPB 400 mg        400 mg 200 mL/hr over 60 Minutes Intravenous Every 12 hours 08/26/23 2005 08/29/23 1712   08/26/23 2345  vancomycin  (VANCOCIN ) IVPB 1000 mg/200 mL premix  Status:  Discontinued        1,000 mg 200 mL/hr over 60 Minutes Intravenous Every 12 hours 08/26/23 1819 08/26/23 2001   08/26/23  1100  levofloxacin  (LEVAQUIN ) IVPB 500 mg        500 mg 100 mL/hr over 60 Minutes Intravenous On call to O.R. 08/26/23 1058 08/26/23 1315   08/26/23 1100  vancomycin  (VANCOCIN ) IVPB 1000 mg/200 mL premix        1,000 mg 200 mL/hr over 60 Minutes Intravenous On call to O.R. 08/26/23 1058 08/26/23 1302   08/26/23 0000  doxycycline  (VIBRA -TABS) 100 MG tablet  Status:  Discontinued        100 mg Oral 2 times daily 08/26/23 1723 08/30/23      .  She was given sequential compression devices, early ambulation, and aspirin  for DVT prophylaxis.  She benefited maximally from the hospital stay and there were no complications.    Recent vital signs:  Vitals:   09/02/23 0625 09/02/23 0942  BP: 127/73   Pulse: 79   Resp: 17   Temp: 99.7 F (37.6 C)   SpO2: 97% 97%     Recent laboratory studies:  Lab Results  Component Value Date   HGB 8.4 (L) 09/02/2023   HGB 8.2 (L) 09/01/2023   HGB 7.5 (L) 08/31/2023   Lab Results  Component Value Date   WBC 7.8 09/02/2023   PLT 147 (L) 09/02/2023   Lab Results  Component Value Date   INR 0.98 02/16/2017   Lab Results  Component Value Date   NA 136 09/02/2023   K 3.7 09/02/2023   CL 106 09/02/2023   CO2 22 09/02/2023   BUN 19 09/02/2023   CREATININE 0.70 09/02/2023   GLUCOSE 112 (H) 09/02/2023    Discharge Medications:   Allergies as of 09/02/2023       Reactions   Coconut Flavoring Agent (non-screening) Anaphylaxis   Anything with coconut in it   Cephalosporins Hives   Morphine And Codeine    dizziness   Sulfa Antibiotics Hives, Rash   Sulfasalazine Hives, Rash   Ketorolac     Headaches and dizziness   Cefuroxime Axetil Itching, Rash   Estrogens Rash        Medication List     STOP taking these medications    HYDROcodone -acetaminophen  5-325 MG tablet Commonly known as: NORCO/VICODIN       TAKE these medications    acetaminophen  500 MG tablet Commonly known as: TYLENOL  Take 2 tablets (1,000 mg total) by mouth every 8 (eight) hours as needed.   albuterol  108 (90 Base) MCG/ACT inhaler Commonly known as: VENTOLIN  HFA Inhale 2 puffs into the lungs every 6 (six) hours as needed for wheezing or shortness of breath.   albuterol  (2.5 MG/3ML) 0.083% nebulizer solution Commonly known as: PROVENTIL  Take 3 mLs (2.5 mg total) by nebulization every 6 (six) hours as needed for wheezing or shortness of breath.   ARIPiprazole  2 MG tablet Commonly known as: ABILIFY  Take 2 mg by mouth daily.   aspirin  EC 81 MG tablet Take 1 tablet (81 mg total) by mouth every 12 (twelve) hours. What changed: when to take this   atorvastatin  40 MG tablet Commonly known as: LIPITOR Take 40 mg by mouth daily.   busPIRone  10 MG tablet Commonly known as: BUSPAR  Take 5 mg by mouth 2 (two) times  daily.   calcium -vitamin D  500-200 MG-UNIT tablet Commonly known as: OSCAL WITH D Take 1 tablet by mouth daily with breakfast.   cetirizine 10 MG tablet Commonly known as: ZYRTEC Take 10 mg by mouth daily.   chlorhexidine  4 % external liquid Commonly known as: HIBICLENS  Apply 15  mLs (1 Application total) topically as directed for 30 doses. Use as directed daily for 5 days every other week for 6 weeks. What changed: Another medication with the same name was added. Make sure you understand how and when to take each.   chlorhexidine  4 % external liquid Commonly known as: HIBICLENS  Apply 15 mLs (1 Application total) topically as directed for 30 doses. Use as directed daily for 5 days every other week for 6 weeks. What changed: You were already taking a medication with the same name, and this prescription was added. Make sure you understand how and when to take each.   doxycycline  100 MG tablet Commonly known as: VIBRA -TABS Take 1 tablet (100 mg total) by mouth 2 (two) times daily for 7 days.   eszopiclone 2 MG Tabs tablet Commonly known as: LUNESTA Take 2 mg by mouth at bedtime as needed for sleep. Take immediately before bedtime   FISH OIL PO Take 1,000 mg by mouth 2 (two) times daily.   fluticasone  50 MCG/ACT nasal spray Commonly known as: FLONASE  Place 2 sprays into both nostrils daily.   furosemide  40 MG tablet Commonly known as: LASIX  Take 40 mg by mouth daily as needed for fluid.   gabapentin  300 MG capsule Commonly known as: NEURONTIN  Take 900 mg by mouth 3 (three) times daily.   GLUCOSAMINE-CHONDROITIN PO Take 1 tablet by mouth at bedtime.   lisinopril  40 MG tablet Commonly known as: ZESTRIL  Take 20 mg by mouth daily.   methocarbamol  500 MG tablet Commonly known as: ROBAXIN  Take 1 tablet (500 mg total) by mouth every 8 (eight) hours as needed for up to 10 days for muscle spasms.   miconazole 2 % cream Commonly known as: MICOTIN Apply 1 application topically  2 (two) times daily as needed (fungus).   montelukast  10 MG tablet Commonly known as: SINGULAIR  Take 10 mg by mouth daily.   multivitamin with minerals Tabs tablet Take 1 tablet by mouth daily.   mupirocin  ointment 2 % Commonly known as: BACTROBAN  Place 1 Application into the nose 2 (two) times daily for 60 doses. Use as directed 2 times daily for 5 days every other week for 6 weeks. What changed: Another medication with the same name was added. Make sure you understand how and when to take each.   mupirocin  ointment 2 % Commonly known as: BACTROBAN  Place 1 Application into the nose 2 (two) times daily for 60 doses. Use as directed 2 times daily for 5 days every other week for 6 weeks. What changed: You were already taking a medication with the same name, and this prescription was added. Make sure you understand how and when to take each.   nystatin cream Commonly known as: MYCOSTATIN Apply 1 Application topically 2 (two) times daily as needed for dry skin.   omeprazole  20 MG capsule Commonly known as: PRILOSEC Take 40 mg by mouth daily before breakfast.   ondansetron  4 MG tablet Commonly known as: Zofran  Take 1 tablet (4 mg total) by mouth every 8 (eight) hours as needed for up to 14 days for nausea or vomiting.   oxyBUTYnin  Chloride 2.5 MG Tabs Take 2.5 mg by mouth daily.   oxyCODONE  5 MG immediate release tablet Commonly known as: Roxicodone  Take 1 tablet (5 mg total) by mouth every 4 (four) hours as needed for up to 7 days for severe pain (pain score 7-10) or moderate pain (pain score 4-6).   polyethylene glycol 17 g packet Commonly known as: MiraLax   Take 17 g by mouth daily.   sertraline  100 MG tablet Commonly known as: ZOLOFT  Take 100 mg by mouth 2 (two) times daily.   Trelegy Ellipta  100-62.5-25 MCG/ACT Aepb Generic drug: Fluticasone -Umeclidin-Vilant Inhale 1 puff into the lungs daily.        Diagnostic Studies: DG Knee 1-2 Views Left Result Date:  09/02/2023 CLINICAL DATA:  Postop. EXAM: LEFT KNEE - 1-2 VIEW COMPARISON:  Radiograph 08/26/2023 FINDINGS: Revision knee arthroplasty in expected alignment. Again seen screw within the distal medial femoral condyle. No periprosthetic fracture or lucency. Small joint effusion. Generalized soft tissue edema persists. Overlying wound VAC appears removed. IMPRESSION: Revision knee arthroplasty in expected alignment. Electronically Signed   By: Chadwick Colonel M.D.   On: 09/02/2023 09:21   US  Abdomen Complete Result Date: 08/31/2023 CLINICAL DATA:  [id number] AKI (acute kidney injury) [facility]) [id number] Transaminitis [id number] EXAM: ABDOMEN ULTRASOUND COMPLETE COMPARISON:  March 21, 2004 FINDINGS: Gallbladder: Surgically absent. Common bile duct: Diameter: Visualized portion measures 12 mm, dilated. Liver: No focal lesion identified. Within normal limits in parenchymal echogenicity. Portal vein is patent on color Doppler imaging with normal direction of blood flow towards the liver. IVC: No abnormality visualized. Pancreas: Visualized portion unremarkable. Spleen: Spleen is homogeneously enlarged and spans 15.9 x 7.1 x 16.4 cm for an estimated volume of 961 ML Right Kidney: Length: 9.0 cm. Echogenicity within normal limits. No mass or hydronephrosis visualized. Left Kidney: Length: 11.3 cm. Echogenicity within normal limits. No hydronephrosis visualized. Benign cyst measuring up to 13 mm (for which no dedicated imaging follow-up is recommended). Abdominal aorta: No aneurysm visualized. Other findings: None. IMPRESSION: 1. Splenomegaly. 2. Dilated common bile duct measuring up to 12 mm. This is nonspecific and may reflect postcholecystectomy state. Recommend correlation with LFTs. Electronically Signed   By: Clancy Crimes M.D.   On: 08/31/2023 07:49   DG CHEST PORT 1 VIEW Result Date: 08/30/2023 CLINICAL DATA:  Hypoxia EXAM: PORTABLE CHEST 1 VIEW COMPARISON:  02/16/2017 FINDINGS: Cardiac shadow is enlarged but  accentuated by the portable technique. Aortic calcifications are noted. The lungs are clear bilaterally. Postsurgical changes in the cervical spine are seen. No bony abnormality is noted IMPRESSION: No acute abnormality seen. Electronically Signed   By: Violeta Grey M.D.   On: 08/30/2023 22:39   DG Knee Left Port Result Date: 08/26/2023 CLINICAL DATA:  Status post knee replacement. EXAM: PORTABLE LEFT KNEE - 1-2 VIEW COMPARISON:  Radiograph 02/09/2023 FINDINGS: Revision knee arthroplasty in expected alignment. No periprosthetic lucency or fracture. There has been patellar resurfacing. Recent postsurgical change includes air and edema in the soft tissues and joint space. Wound VAC anteriorly. Screws within the distal medial femur or present on prior exam, some of those screws may have been removed. IMPRESSION: Revision knee arthroplasty without immediate postoperative complication. Electronically Signed   By: Chadwick Colonel M.D.   On: 08/26/2023 17:59    Disposition: Discharge disposition: 03-Skilled Nursing Facility       Discharge Instructions     Call MD / Call 911   Complete by: As directed    If you experience chest pain or shortness of breath, CALL 911 and be transported to the hospital emergency room.  If you develope a fever above 101 F, pus (white drainage) or increased drainage or redness at the wound, or calf pain, call your surgeon's office.   Constipation Prevention   Complete by: As directed    Drink plenty of fluids.  Prune juice may be helpful.  You  may use a stool softener, such as Colace (over the counter) 100 mg twice a day.  Use MiraLax  (over the counter) for constipation as needed.   Diet - low sodium heart healthy   Complete by: As directed    Increase activity slowly as tolerated   Complete by: As directed    Post-operative opioid taper instructions:   Complete by: As directed    POST-OPERATIVE OPIOID TAPER INSTRUCTIONS: It is important to wean off of your opioid  medication as soon as possible. If you do not need pain medication after your surgery it is ok to stop day one. Opioids include: Codeine, Hydrocodone (Norco, Vicodin), Oxycodone (Percocet, oxycontin ) and hydromorphone  amongst others.  Long term and even short term use of opiods can cause: Increased pain response Dependence Constipation Depression Respiratory depression And more.  Withdrawal symptoms can include Flu like symptoms Nausea, vomiting And more Techniques to manage these symptoms Hydrate well Eat regular healthy meals Stay active Use relaxation techniques(deep breathing, meditating, yoga) Do Not substitute Alcohol  to help with tapering If you have been on opioids for less than two weeks and do not have pain than it is ok to stop all together.  Plan to wean off of opioids This plan should start within one week post op of your joint replacement. Maintain the same interval or time between taking each dose and first decrease the dose.  Cut the total daily intake of opioids by one tablet each day Next start to increase the time between doses. The last dose that should be eliminated is the evening dose.           Follow-up Information     Murleen Arms, MD Follow up in 1 week(s).   Specialty: Orthopedic Surgery Contact information: [address] [place] [address] [phone]         [facility] Follow up.   Contact information: [phone]                   Discharge Instructions      INSTRUCTIONS AFTER JOINT REPLACEMENT   Remove items at home which could result in a fall. This includes throw rugs or furniture in walking pathways ICE to the affected joint every three hours while awake for 30 minutes at a time, for at least the first 3-5 days, and then as needed for pain and swelling.  Continue to use ice for pain and swelling. You may notice swelling that will progress down to the foot and ankle.  This is normal  after surgery.  Elevate your leg when you are not up walking on it.   Continue to use the breathing machine you got in the hospital (incentive spirometer) which will help keep your temperature down.  It is common for your temperature to cycle up and down following surgery, especially at night when you are not up moving around and exerting yourself.  The breathing machine keeps your lungs expanded and your temperature down.  DIET:  As you were doing prior to hospitalization, we recommend a well-balanced diet.  DRESSING / WOUND CARE / SHOWERING:  Keep the surgical dressing until follow up.    The dressing is also water  resistant, however it is best to shower with an extra covering, such as saran wrap to keep it dry.  IF THE DRESSING FALLS OFF or the wound gets wet inside, change the dressing with sterile gauze and call our office for further instruction.  Please use good hand washing  techniques before changing the dressing.  Do not use any lotions or creams on the incision until instructed by your surgeon.     ACTIVITY  Increase activity slowly as tolerated, but follow the weight bearing instructions below.   No driving for 6 weeks or until further direction given by your physician.  You cannot drive while taking narcotics.  No lifting or carrying greater than 10 lbs. until further directed by your surgeon. Avoid periods of inactivity such as sitting longer than an hour when not asleep. This helps prevent blood clots.  You may return to work once you are authorized by your doctor.   WEIGHT BEARING: Weight bearing as tolerated with assist device (walker, cane, etc) as directed, use it as long as suggested by your surgeon or therapist, typically at least 4-6 weeks.  EXERCISES  Results after joint replacement surgery are often greatly improved when you follow the exercise, range of motion and muscle strengthening exercises prescribed by your doctor. Safety measures are also important to protect the  joint from further injury. Any time any of these exercises cause you to have increased pain or swelling, decrease what you are doing until you are comfortable again and then slowly increase them. If you have problems or questions, call your caregiver or physical therapist for advice.   Rehabilitation is important following a joint replacement. After just a few days of immobilization, the muscles of the leg can become weakened and shrink (atrophy).  These exercises are designed to build up the tone and strength of the thigh and leg muscles and to improve motion. Often times heat used for twenty to thirty minutes before working out will loosen up your tissues and help with improving the range of motion but do not use heat for the first two weeks following surgery (sometimes heat can increase post-operative swelling).   These exercises can be done on a training (exercise) mat, on the floor, on a table or on a bed. Use whatever works the best and is most comfortable for you.    Use music or television while you are exercising so that the exercises are a pleasant break in your day. This will make your life better with the exercises acting as a break in your routine that you can look forward to.   Perform all exercises about fifteen times, three times per day or as directed.  You should exercise both the operative leg and the other leg as well.  Exercises include:   Quad Sets - Tighten up the muscle on the front of the thigh (Quad) and hold for 5-10 seconds.   Straight Leg Raises - With your knee straight (if you were given a brace, keep it on), lift the leg to 60 degrees, hold for 3 seconds, and slowly lower the leg.  Perform this exercise against resistance later as your leg gets stronger.  Leg Slides: Lying on your back, slowly slide your foot toward your buttocks, bending your knee up off the floor (only go as far as is comfortable). Then slowly slide your foot back down until your leg is flat on the floor  again.  Angel Wings: Lying on your back spread your legs to the side as far apart as you can without causing discomfort.  Hamstring Strength:  Lying on your back, push your heel against the floor with your leg straight by tightening up the muscles of your buttocks.  Repeat, but this time bend your knee to a comfortable angle, and push your  heel against the floor.  You may put a pillow under the heel to make it more comfortable if necessary.   A rehabilitation program following joint replacement surgery can speed recovery and prevent re-injury in the future due to weakened muscles. Contact your doctor or a physical therapist for more information on knee rehabilitation.   CONSTIPATION:  Constipation is defined medically as fewer than three stools per week and severe constipation as less than one stool per week.  Even if you have a regular bowel pattern at home, your normal regimen is likely to be disrupted due to multiple reasons following surgery.  Combination of anesthesia, postoperative narcotics, change in appetite and fluid intake all can affect your bowels.   YOU MUST use at least one of the following options; they are listed in order of increasing strength to get the job done.  They are all available over the counter, and you may need to use some, POSSIBLY even all of these options:    Drink plenty of fluids (prune juice may be helpful) and high fiber foods Colace 100 mg by mouth twice a day  Senokot for constipation as directed and as needed Dulcolax (bisacodyl ), take with full glass of water   Miralax  (polyethylene glycol) once or twice a day as needed.  If you have tried all these things and are unable to have a bowel movement in the first 3-4 days after surgery call either your surgeon or your primary doctor.    If you experience loose stools or diarrhea, hold the medications until you stool forms back up.  If your symptoms do not get better within 1 week or if they get worse, check with  your doctor.  If you experience "the worst abdominal pain ever" or develop nausea or vomiting, please contact the office immediately for further recommendations for treatment.  ITCHING:  If you experience itching with your medications, try taking only a single pain pill, or even half a pain pill at a time.  You can also use Benadryl  over the counter for itching or also to help with sleep.   TED HOSE STOCKINGS:  Use stockings on both legs until for at least 2 weeks or as directed by physician office. They may be removed at night for sleeping.  MEDICATIONS:  See your medication summary on the "After Visit Summary" that nursing will review with you.  You may have some home medications which will be placed on hold until you complete the course of blood thinner medication.  It is important for you to complete the blood thinner medication as prescribed.  Blood clot prevention (DVT Prophylaxis): After surgery you are at an increased risk for a blood clot. you were prescribed a blood thinner, Aspirin  81mg , to be taken twice daily for a total of 4 weeks from surgery to help reduce your risk of getting a blood clot.  Signs of a pulmonary embolus (blood clot in the lungs) include sudden short of breath, feeling lightheaded or dizzy, chest pain with a deep breath, rapid pulse rapid breathing.  Signs of a blood clot in your arms or legs include new unexplained swelling and cramping, warm, red or darkened skin around the painful area.  Please call the office or 911 right away if these signs or symptoms develop.  PRECAUTIONS:   If you experience chest pain or shortness of breath - call 911 immediately for transfer to the hospital emergency department.   If you develop a fever greater that 101 F, purulent drainage  from wound, increased redness or drainage from wound, foul odor from the wound/dressing, or calf pain - CONTACT YOUR SURGEON.                                                   FOLLOW-UP APPOINTMENTS:  If you  do not already have a post-op appointment, please call the office for an appointment to be seen by your surgeon.  Guidelines for how soon to be seen are listed in your "After Visit Summary", but are typically between 2-3 weeks after surgery.  If you have a specialized bandage, you may be told to follow up 1 week after surgery.  OTHER INSTRUCTIONS:  Knee Replacement:  Do not place pillow under knee, focus on keeping the knee straight while resting.  Place foam block, curve side up under heel at all times except when walking.  DO NOT modify, tear, cut, or change the foam block in any way.  POST-OPERATIVE OPIOID TAPER INSTRUCTIONS: It is important to wean off of your opioid medication as soon as possible. If you do not need pain medication after your surgery it is ok to stop day one. Opioids include: Codeine, Hydrocodone (Norco, Vicodin), Oxycodone (Percocet, oxycontin ) and hydromorphone  amongst others.  Long term and even short term use of opiods can cause: Increased pain response Dependence Constipation Depression Respiratory depression And more.  Withdrawal symptoms can include Flu like symptoms Nausea, vomiting And more Techniques to manage these symptoms Hydrate well Eat regular healthy meals Stay active Use relaxation techniques(deep breathing, meditating, yoga) Do Not substitute Alcohol  to help with tapering If you have been on opioids for less than two weeks and do not have pain than it is ok to stop all together.  Plan to wean off of opioids This plan should start within one week post op of your joint replacement. Maintain the same interval or time between taking each dose and first decrease the dose.  Cut the total daily intake of opioids by one tablet each day Next start to increase the time between doses. The last dose that should be eliminated is the evening dose.   MAKE SURE YOU:  Understand these instructions.  Get help right away if you are not doing well or get worse.     Thank you for letting us  be a part of your medical care team.  It is a privilege we respect greatly.  We hope these instructions will help you stay on track for a fast and full recovery!     Signed: Leman Martinek A Zarius Furr 09/02/2023, 12:26 PM

## 2023-09-02 NOTE — Plan of Care (Signed)
  Problem: Health Behavior/Discharge Planning: Goal: Ability to manage health-related needs will improve Outcome: Adequate for Discharge   Problem: Clinical Measurements: Goal: Ability to maintain clinical measurements within normal limits will improve Outcome: Progressing Goal: Will remain free from infection Outcome: Progressing Goal: Diagnostic test results will improve Outcome: Adequate for Discharge Goal: Respiratory complications will improve Outcome: Progressing Goal: Cardiovascular complication will be avoided Outcome: Adequate for Discharge   Problem: Activity: Goal: Risk for activity intolerance will decrease Outcome: Adequate for Discharge   Problem: Coping: Goal: Level of anxiety will decrease Outcome: Progressing   Problem: Elimination: Goal: Will not experience complications related to bowel motility Outcome: Progressing   Problem: Pain Managment: Goal: General experience of comfort will improve and/or be controlled Outcome: Adequate for Discharge   Problem: Safety: Goal: Ability to remain free from injury will improve Outcome: Progressing   Problem: Skin Integrity: Goal: Risk for impaired skin integrity will decrease Outcome: Progressing   Problem: Education: Goal: Knowledge of the prescribed therapeutic regimen will improve Outcome: Progressing   Problem: Activity: Goal: Ability to avoid complications of mobility impairment will improve Outcome: Progressing Goal: Range of joint motion will improve Outcome: Adequate for Discharge   Problem: Clinical Measurements: Goal: Postoperative complications will be avoided or minimized Outcome: Progressing   Problem: Pain Management: Goal: Pain level will decrease with appropriate interventions Outcome: Adequate for Discharge   Problem: Skin Integrity: Goal: Will show signs of wound healing Outcome: Progressing

## 2023-09-02 NOTE — Progress Notes (Signed)
 7 Days Post-Op Procedure(s) (LRB): TOTAL KNEE REVISION (Left) APPLICATION, WOUND VAC (Left)  Subjective:  Patient reports pain as moderate, mainly back and hip. Icing leg, helpful for pain.  Denies numbness or tingling.    Hgb initially trending down, received 1 unit yesterday.  8.4 this morning, appears stable.  Denies shortness of breath, chest pain, lightheadedness, vision changes.   Per PT, mobilized cumulatively 30 ft x 4 with RW.   No other complaints.   Plan for DC to SNF [facility]) likely today.   Objective:   VITALS:   Vitals:   09/01/23 2100 09/01/23 2125 09/01/23 2155 09/02/23 0625  BP:   (!) 120/56 127/73  Pulse:  66 66 79  Resp:   17 17  Temp:   98.5 F (36.9 C) 99.7 F (37.6 C)  TempSrc:   Oral Oral  SpO2: 97% 98% 99% 97%  Weight:      Height:        Resting comfortably in bed in NAD Sensation intact distally Intact pulses distally Dorsiflexion/Plantar flexion intact Incision: dressing C/D/I Compartment soft Wound VAC holding suction no output in canister Wiggles toes appropriately   Lab Results  Component Value Date   WBC 7.8 09/02/2023   HGB 8.4 (L) 09/02/2023   HCT 25.8 (L) 09/02/2023   MCV 89.3 09/02/2023   PLT 147 (L) 09/02/2023   BMET    Component Value Date/Time   NA 136 09/02/2023 0334   K 3.7 09/02/2023 0334   CL 106 09/02/2023 0334   CO2 22 09/02/2023 0334   GLUCOSE 112 (H) 09/02/2023 0334   BUN 19 09/02/2023 0334   CREATININE 0.70 09/02/2023 0334   CALCIUM  9.0 09/02/2023 0334   GFRNONAA >60 09/02/2023 0334      Xray: Revision knee arthroplasty components in good position no adverse features  Assessment/Plan: 7 Days Post-Op   Principal Problem:   Failed total knee replacement (HCC)   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 Continue mobilizing with PT and advancing diet as tolerated.  Plan for HHPT upon DC.   5/17:  Had long discussion with patient, husband, and daughter Lynn Dixon in room today. Patient does not want to go to SNF but had a hard time understanding the different between short term rehab and a permanent nursing facility. Patient's husband has a bad back with possible upcoming back surgery and is determined to have patient discharge home. He states he and Lynn Dixon will help with an assistance at home. Discussed with Lynn Dixon outside of room that she lives 1 hour away and does not feel that she and her father can give adequate help for her mother at home. She would prefer for her mother to go to SNF. She is very worried that her mother will have a fall at home since she is having a hard time understanding that she needs help while here in the hospital to get up on her own. No definite change in discharge plans made today, will plan to continue conversation tomorrow with Dr. Deedra Farr team.  5/18: CBC pending.  Mobilizing some with PT though recommended for SNF over HHPT.  Long discussion with patient repetitive of info above.  Discussed difference of SNF vs permanent nursing home in detail.  Pt demonstrated understanding.  Pt eager to mobilize with PT today.  TOC looking for bed placement with SNF per discussion with Blaine PA-C. Intra-op cxs still NGTD. 5/19: Evaluated by hospitalist  yesterday, see separate note.  Continued urinary retention, catheter in place.  Patient receiving IVF and upcoming abdominal US  to check for possible urinary obstruction/cause of transaminitis.  Post-operative anemia, still gradually downtrending, may consider transfusion and/or holding DVT prophylaxis as needed.  Continue with PT as tolerated.  Advance diet. 5/21: Hgb improved after 1 unit transfused, appears stable.  Bandage changed today to Aquacel, incision healing well.  Plan for DC to [facility] (SNF).  Repeat x-ray pending.   Post op recs: WB: WBAT Abx: Vancomycin  and Cipro  given perioperatively given patient's history of  cephalosporin allergy, discharged with extended antibiotics with doxycycline , intra-op cxs NGTD Imaging: PACU xrays DVT prophylaxis: Aspirin  81mg  BID x4 weeks Follow up: 1 weeks after surgery for a wound check with Dr. Pryor Browning at [facility].  Address: [address], [place], [place] [address]  Office Phone: [phone]    Lynn Dixon 09/02/2023, 6:46 AM   Contact information:   Weekdays 7am-5pm epic message Dr. Pryor Browning, or call office for patient follow up: [phone] After hours and holidays please check [email or web address] for group call information for [facility]

## 2023-09-02 NOTE — TOC Transition Note (Signed)
 Transition of Care [facility]) - Discharge Note   Patient Details  Name: Lynn Dixon MRN: [id number] Date of Birth: July 09, 1950  Transition of Care [facility] CM/SW Contact:  Delilah Fend, LCSW Phone Number: 09/02/2023, 1:39 PM   Clinical Narrative:     Have received insurance authorization for [facility] SNF.  Pt medically cleared for dc to facility today.  Pt and family aware and agreeable.  PTAR called @ 1:30pm  RN to call report to [phone].  No further TOC needs.  Final next level of care: Skilled Nursing Facility Barriers to Discharge: Barriers Resolved   Patient Goals and CMS Choice Patient states their goals for this hospitalization and ongoing recovery are:: return home          Discharge Placement              Patient chooses bed at: [facility] Patient to be transferred to facility by: PTAR Name of family member notified: spouse Patient and family notified of of transfer: 09/02/23  Discharge Plan and Services Additional resources added to the After Visit Summary for                  DME Arranged: N/A DME Agency: NA       HH Arranged: PT HH Agency: [facility]        Social Drivers of Health (SDOH) Interventions SDOH Screenings   Food Insecurity: No Food Insecurity (08/26/2023)  Housing: Low Risk  (08/26/2023)  Transportation Needs: No Transportation Needs (08/26/2023)  Utilities: Not At Risk (08/26/2023)  Social Connections: Moderately Integrated (08/26/2023)  Tobacco Use: Medium Risk (08/26/2023)     Readmission Risk Interventions    08/27/2023   11:44 AM  Readmission Risk Prevention Plan  Transportation Screening Complete  PCP or Specialist Appt within 5-7 Days Complete  Home Care Screening Complete  Medication Review (RN CM) Complete

## 2023-09-02 NOTE — Progress Notes (Signed)
 PROGRESS NOTE    Lynn Dixon  ZOX:[id number] DOB: Jan 07, 1951 DOA: 08/26/2023 PCP: Alston Jerry, MD    No chief complaint on file.   Brief Narrative:  73 y.o. female with past medical history of COPD, chronic hypoxic respiratory failure on 3L [place], hypertension, obesity, OSA on CPAP, hyperlipidemia, type II diabetes, GERD, chronic neck pain, anxiety and depression was admitted by orthopedics and underwent left total knee arthroplasty revision on 08/26/2023 due to aseptic loosening. TRH consulted on 08/30/2023 for confusion and urinary retention. She was found to have AKI with creatinine of 2.17 and was started on IV fluids.    Assessment & Plan:   Principal Problem:   Failed total knee replacement (HCC) Active Problems:   Essential hypertension   Hyperlipidemia   AKI (acute kidney injury) (HCC)   Hypomagnesemia   Acute urinary retention   Elevated LFTs   Acute postoperative anemia due to expected blood loss   Chronic respiratory failure with hypoxia (HCC)   Controlled type 2 diabetes mellitus without complication, without long-term current use of insulin  (HCC)   Depression   Anxiety  #1 AKI -Secondary to prerenal azotemia secondary to poor oral intake and dehydration. - Patient noted to have a creatinine of 2.17 on 08/30/2023. - Renal function improved with hydration with IV fluids creatinine down to 0.70 today. - IV fluids discontinued.  2.  Acute metabolic encephalopathy/confusion -Likely secondary to AKI and probable urinary retention. - Urinalysis negative for UTI. - Chest x-ray on 08/30/2023 with no acute abnormalities. - Ambien  and Benadryl  discontinued. - Mental status improved and currently likely close to baseline. - No further workup needed at this time.  3.  Acute urinary retention -Stat post Foley catheter placement. - Attempted a voiding trial on 09/01/2023 which was successful. - Outpatient follow-up with PCP.  4.  Elevated LFTs -LFTs elevated however  improving. - Right upper quadrant ultrasound with dilated common bile duct measuring up to 12 mm.  Asymptomatic, no abdominal pain. - Statin discontinued. - Outpatient follow-up with GI.  5.  Hypomagnesemia -Magnesium  noted at 1.6 this morning. - Magnesium  sulfate 4 g IV x 1. - Outpatient follow-up.  6.  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  7.  Postop acute blood loss anemia -Status posttransfusion 1 unit PRBCs on 08/31/2023. - Hemoglobin stable at 8.4. -If still here we will check an anemia panel in the morning. - Outpatient follow-up.  8.  COPD/chronic respiratory failure with hypoxia -Stable.  9.  OSA -Sleep nightly.  10.  Well-controlled diabetes mellitus type 2 -Hemoglobin A1c 5.7. - Blood glucose level of 112 on labs this morning.   - Outpatient follow-up.  11.  Hypertension/hyperlipidemia -BP stable. - Lisinopril  on hold. - Statin on hold due to elevated LFTs. - Outpatient follow-up with PCP.  12.  Depression/anxiety -Continue home regimen Zoloft , Abilify , BuSpar .  13.  Morbid obesity -Lifestyle modification. - Outpatient follow-up with PCP.   DVT prophylaxis: Per primary team Code Status: Full Family Communication: Updated patient, husband, daughter at bedside. Disposition: SNF today  Status is: Inpatient Remains inpatient appropriate because: Severity of illness   Consultants:  [facility] hospitalist: Dr. Consuela Denier 08/30/2023  Procedures:  Both component revision left total knee arthroplasty: Per Dr. Pryor Browning 08/26/2023  Antimicrobials:  Anti-infectives (From admission, onward)    Start     Dose/Rate Route Frequency Ordered Stop   08/30/23 0800  doxycycline  (VIBRA -TABS) tablet 100 mg  100 mg Oral Every 12 hours 08/29/23 1318 09/06/23 0759   08/30/23 0000  doxycycline  (VIBRA -TABS) 100 MG tablet        100 mg Oral 2 times daily 08/30/23 0804 09/06/23 2359   08/28/23 1000  vancomycin   (VANCOCIN ) IVPB 1000 mg/200 mL premix  Status:  Discontinued        1,000 mg 200 mL/hr over 60 Minutes Intravenous Every 48 hours 08/26/23 2005 08/27/23 0823   08/28/23 1000  Vancomycin  (VANCOCIN ) 1,250 mg in sodium chloride  0.9 % 250 mL IVPB        1,250 mg 166.7 mL/hr over 90 Minutes Intravenous Every 48 hours 08/27/23 0823 08/28/23 1248   08/27/23 1600  ciprofloxacin  (CIPRO ) IVPB 400 mg        400 mg 200 mL/hr over 60 Minutes Intravenous Every 12 hours 08/26/23 2005 08/29/23 1712   08/26/23 2345  vancomycin  (VANCOCIN ) IVPB 1000 mg/200 mL premix  Status:  Discontinued        1,000 mg 200 mL/hr over 60 Minutes Intravenous Every 12 hours 08/26/23 1819 08/26/23 2001   08/26/23 1100  levofloxacin  (LEVAQUIN ) IVPB 500 mg        500 mg 100 mL/hr over 60 Minutes Intravenous On call to O.R. 08/26/23 1058 08/26/23 1315   08/26/23 1100  vancomycin  (VANCOCIN ) IVPB 1000 mg/200 mL premix        1,000 mg 200 mL/hr over 60 Minutes Intravenous On call to O.R. 08/26/23 1058 08/26/23 1302   08/26/23 0000  doxycycline  (VIBRA -TABS) 100 MG tablet  Status:  Discontinued        100 mg Oral 2 times daily 08/26/23 1723 08/30/23          Subjective: Laying in bed.  Just finished ambulating with therapy in the hallway.  Alert and oriented to self place and time.  Denies any chest pain or shortness of breath.  States good urine output after Foley catheter was removed.  Likely close to baseline.  Husband at bedside.  Objective: Vitals:   09/01/23 2125 09/01/23 2155 09/02/23 0625 09/02/23 0942  BP:  (!) 120/56 127/73   Pulse: 66 66 79   Resp:  17 17   Temp:  98.5 F (36.9 C) 99.7 F (37.6 C)   TempSrc:  Oral Oral   SpO2: 98% 99% 97% 97%  Weight:      Height:        Intake/Output Summary (Last 24 hours) at 09/02/2023 1857 Last data filed at 09/02/2023 1500 Gross per 24 hour  Intake 172.24 ml  Output 875 ml  Net -702.76 ml   Filed Weights   08/26/23 1106  Weight: 88 kg     Examination:  General exam: Appears calm and comfortable  Respiratory system: Clear to auscultation. Respiratory effort normal. Cardiovascular system: S1 & S2 heard, RRR. No JVD, murmurs, rubs, gallops or clicks. No pedal edema. Gastrointestinal system: Abdomen is nondistended, soft and nontender. No organomegaly or masses felt. Normal bowel sounds heard. Central nervous system: Alert and oriented. No focal neurological deficits. Extremities: Symmetric 5 x 5 power. Skin: No rashes, lesions or ulcers Psychiatry: Judgement and insight appear normal. Mood & affect appropriate.     Data Reviewed: I have personally reviewed following labs and imaging studies  CBC: Recent Labs  Lab 08/28/23 0327 08/30/23 1031 08/31/23 0840 09/01/23 0324 09/02/23 0334  WBC 8.5 11.2* 9.3 8.4 7.8  NEUTROABS  --   --   --  7.0 6.3  HGB 9.0* 8.2* 7.5* 8.2* 8.4*  HCT 28.5* 25.3* 23.9* 25.7* 25.8*  MCV 93.8 92.7 93.7 91.1 89.3  PLT 110* 140* 142* 123* 147*    Basic Metabolic Panel: Recent Labs  Lab 08/27/23 0350 08/30/23 1944 08/31/23 0840 09/01/23 0324 09/02/23 0334  NA 138 128* 133* 136 136  K 3.9 4.1 4.0 3.9 3.7  CL 106 94* 100 104 106  CO2 21* 24 21* 24 22  GLUCOSE 126* 132* 121* 117* 112*  BUN 17 36* 33* 28* 19  CREATININE 1.01* 2.17* 1.55* 1.10* 0.70  CALCIUM  8.6* 8.0* 8.2* 8.6* 9.0  MG  --   --  1.5* 1.6* 1.6*    GFR: Estimated Creatinine Clearance: 56.9 mL/min (by C-G formula based on SCr of 0.7 mg/dL).  Liver Function Tests: Recent Labs  Lab 08/30/23 1944 08/31/23 0840 09/01/23 0324 09/02/23 0334  AST 206* 185* 174* 172*  ALT 56* 59* 65* 80*  ALKPHOS 56 69 83 84  BILITOT 1.2 1.3* 1.2 0.9  PROT 6.1* 6.4* 6.4* 6.6  ALBUMIN 2.9* 2.8* 2.8* 2.8*    CBG: No results for input(s): "GLUCAP" in the last 168 hours.    Recent Results (from the past 240 hours)  Aerobic/Anaerobic Culture w Gram Stain (surgical/deep wound)     Status: None   Collection Time: 08/26/23   1:49 PM   Specimen: Path Tissue  Result Value Ref Range Status   Specimen Description   Final    TISSUE LEFT KNEE 1 SPECIMEN A Performed at [facility], [address] [place], [place] [address]    Special Requests   Final    NONE Performed at [facility], [address] [place], [place] [address]    Gram Stain NO WBC SEEN NO ORGANISMS SEEN   Final   Culture   Final    No growth aerobically or anaerobically. Performed at [facility], [address] [place], [place] [address]    Report Status 08/31/2023 FINAL  Final  Aerobic/Anaerobic Culture w Gram Stain (surgical/deep wound)     Status: None   Collection Time: 08/26/23  1:50 PM   Specimen: Path Tissue  Result Value Ref Range Status   Specimen Description   Final    TISSUE LEFT KNEE 2 SPECIMEN B Performed at [facility], [address] [place], [place] [address]    Special Requests   Final    NONE Performed at [facility], [address] [place], [place] [address]    Gram Stain NO WBC SEEN NO ORGANISMS SEEN   Final   Culture   Final    No growth aerobically or anaerobically. Performed at [facility], [address] [place], [place] [address]    Report Status 08/31/2023 FINAL  Final  Aerobic/Anaerobic Culture w Gram Stain (surgical/deep wound)     Status: None   Collection Time: 08/26/23  1:50 PM   Specimen: Path Tissue  Result Value Ref Range Status   Specimen Description   Final    TISSUE LEFT KNEE 3 SPECIMEN C Performed at [facility], [address] [place], [place] [address]    Special Requests   Final    NONE Performed at [facility], [address] [place], [place] [address]    Gram Stain NO WBC SEEN NO ORGANISMS SEEN   Final   Culture   Final    No growth aerobically or anaerobically. Performed at [facility], [address] [place], [place] [address]     Report Status 08/31/2023 FINAL  Final  Culture, blood (Routine X 2) w Reflex to ID Panel     Status: None (Preliminary result)   Collection Time: 08/30/23  8:09 PM   Specimen: BLOOD  Result Value Ref Range Status   Specimen Description   Final    BLOOD BLOOD LEFT HAND AEROBIC BOTTLE ONLY Performed at [facility], [address], [place], [place] [address]    Special Requests   Final    Blood Culture results may not be optimal due to an inadequate volume of blood received in culture bottles Performed at [facility], [address], [place], [place] [address]    Culture   Final    NO GROWTH 3 DAYS Performed at [facility], [address], [place], [place] [address]    Report Status PENDING  Incomplete  Culture, blood (Routine X 2) w Reflex to ID Panel     Status: None (Preliminary result)   Collection Time: 08/30/23  8:09 PM   Specimen: BLOOD  Result Value Ref Range Status   Specimen Description   Final    BLOOD BLOOD LEFT HAND AEROBIC BOTTLE ONLY Performed at [facility], [address], [place], [place] [address]    Special Requests   Final    Blood Culture results may not be optimal due to an inadequate volume of blood received in culture bottles Performed at [facility], [address] [place], [place] [address]    Culture   Final    NO GROWTH 3 DAYS Performed at [facility], [address] [place], [place] [address]    Report Status PENDING  Incomplete         Radiology Studies: DG Knee 1-2 Views Left Result Date: 09/02/2023 CLINICAL DATA:  Postop. EXAM: LEFT KNEE - 1-2 VIEW COMPARISON:  Radiograph 08/26/2023 FINDINGS: Revision knee arthroplasty in expected alignment. Again seen screw within the distal medial femoral condyle. No periprosthetic fracture or lucency. Small joint effusion. Generalized soft tissue edema persists. Overlying wound VAC appears removed. IMPRESSION:  Revision knee arthroplasty in expected alignment. Electronically Signed   By: Chadwick Colonel M.D.   On: 09/02/2023 09:21        Scheduled Meds:  ARIPiprazole   2 mg Oral Daily   aspirin   81 mg Oral BID   budesonide -glycopyrrolate -formoterol   2 puff Inhalation BID   busPIRone   5 mg Oral BID   Chlorhexidine  Gluconate Cloth  6 each Topical Daily   docusate sodium   100 mg Oral BID   doxycycline   100 mg Oral Q12H   fluticasone   2 spray Each Nare Daily   gabapentin   100 mg Oral TID   lidocaine   1 patch Transdermal Q24H   loratadine   10 mg Oral Daily   montelukast   10 mg Oral Daily   pantoprazole   40 mg Oral Daily   sertraline   100 mg Oral BID   Continuous Infusions:  magnesium  sulfate bolus IVPB 50 mL/hr at 09/02/23 1135     LOS: 7 days    Time spent: 35 minutes    Hilda Lovings, MD [facility]   To contact the attending provider between 7A-7P or the covering provider during after hours 7P-7A, please log into the web site [email or web address] and access using universal [place] password for that web site. If you do not have the password, please call the hospital operator.  09/02/2023, 6:57  PM

## 2023-09-02 NOTE — Discharge Summary (Deleted)
 Physician Discharge Summary  Patient ID: Lynn Dixon MRN: [id number] DOB/AGE: Jan 19, 1951 73 y.o.  Admit date: 08/26/2023 Discharge date: 09/02/2023  Admission Diagnoses:  Failed total knee replacement [facility])  Discharge Diagnoses:  Principal Problem:   Failed total knee replacement (HCC)   Past Medical History:  Diagnosis Date   Abnormal stress test    Allergy    Arthritis    BACK   Chronic neck pain    Constipation    takes Sennokot daily   COPD (chronic obstructive pulmonary disease) (HCC)    Depression    PTSD-takes Zoloft  daily   Diabetes mellitus without complication (HCC)    diet controled   Dyspnea    GERD (gastroesophageal reflux disease)    Glaucoma    mild in left eye   H/O hiatal hernia    History of blood transfusion    History of bronchitis    History of cardiac cath    a. normal cors by cath in 04/2018 following an abnormal NST.   History of colon polyps    benign   History of echocardiogram    Echocardiogram 2/22: EF 65-70, no RWMA, normal diastolic function, normal RVSF, mild LAE, inadequate TR jet (indeterminate PASP)   Hyperlipidemia    Hypertension    Morbid obesity (HCC)    PONV (postoperative nausea and vomiting)    Sleep apnea    USES CPAP   Weakness    numbness and tingling. Takes Gabapentin  daily    Surgeries: Procedure(s): TOTAL KNEE REVISION APPLICATION, WOUND VAC on 08/26/2023   Consultants (if any):   Discharged Condition: Improved  Hospital Course: Lynn Dixon is an 73 y.o. female who was admitted 08/26/2023 with a diagnosis of Failed total knee replacement (HCC) and went to the operating room on 08/26/2023 and underwent the above named procedures.    She was given perioperative antibiotics:  Anti-infectives (From admission, onward)    Start     Dose/Rate Route Frequency Ordered Stop   08/30/23 0800  doxycycline  (VIBRA -TABS) tablet 100 mg        100 mg Oral Every 12 hours 08/29/23 1318 09/06/23 0759   08/30/23 0000   doxycycline  (VIBRA -TABS) 100 MG tablet        100 mg Oral 2 times daily 08/30/23 0804 09/06/23 2359   08/28/23 1000  vancomycin  (VANCOCIN ) IVPB 1000 mg/200 mL premix  Status:  Discontinued        1,000 mg 200 mL/hr over 60 Minutes Intravenous Every 48 hours 08/26/23 2005 08/27/23 0823   08/28/23 1000  Vancomycin  (VANCOCIN ) 1,250 mg in sodium chloride  0.9 % 250 mL IVPB        1,250 mg 166.7 mL/hr over 90 Minutes Intravenous Every 48 hours 08/27/23 0823 08/28/23 1248   08/27/23 1600  ciprofloxacin  (CIPRO ) IVPB 400 mg        400 mg 200 mL/hr over 60 Minutes Intravenous Every 12 hours 08/26/23 2005 08/29/23 1712   08/26/23 2345  vancomycin  (VANCOCIN ) IVPB 1000 mg/200 mL premix  Status:  Discontinued        1,000 mg 200 mL/hr over 60 Minutes Intravenous Every 12 hours 08/26/23 1819 08/26/23 2001   08/26/23 1100  levofloxacin  (LEVAQUIN ) IVPB 500 mg        500 mg 100 mL/hr over 60 Minutes Intravenous On call to O.R. 08/26/23 1058 08/26/23 1315   08/26/23 1100  vancomycin  (VANCOCIN ) IVPB 1000 mg/200 mL premix        1,000 mg 200 mL/hr over 60  Minutes Intravenous On call to O.R. 08/26/23 1058 08/26/23 1302   08/26/23 0000  doxycycline  (VIBRA -TABS) 100 MG tablet  Status:  Discontinued        100 mg Oral 2 times daily 08/26/23 1723 08/30/23      .  She was given sequential compression devices, early ambulation, and aspirin  for DVT prophylaxis.  She benefited maximally from the hospital stay and there were no complications.    Recent vital signs:  Vitals:   09/02/23 0625 09/02/23 0942  BP: 127/73   Pulse: 79   Resp: 17   Temp: 99.7 F (37.6 C)   SpO2: 97% 97%    Recent laboratory studies:  Lab Results  Component Value Date   HGB 8.4 (L) 09/02/2023   HGB 8.2 (L) 09/01/2023   HGB 7.5 (L) 08/31/2023   Lab Results  Component Value Date   WBC 7.8 09/02/2023   PLT 147 (L) 09/02/2023   Lab Results  Component Value Date   INR 0.98 02/16/2017   Lab Results  Component Value Date    NA 136 09/02/2023   K 3.7 09/02/2023   CL 106 09/02/2023   CO2 22 09/02/2023   BUN 19 09/02/2023   CREATININE 0.70 09/02/2023   GLUCOSE 112 (H) 09/02/2023    Discharge Medications:   Allergies as of 09/02/2023       Reactions   Coconut Flavoring Agent (non-screening) Anaphylaxis   Anything with coconut in it   Cephalosporins Hives   Morphine And Codeine    dizziness   Sulfa Antibiotics Hives, Rash   Sulfasalazine Hives, Rash   Ketorolac     Headaches and dizziness   Cefuroxime Axetil Itching, Rash   Estrogens Rash        Medication List     STOP taking these medications    HYDROcodone -acetaminophen  5-325 MG tablet Commonly known as: NORCO/VICODIN       TAKE these medications    acetaminophen  500 MG tablet Commonly known as: TYLENOL  Take 2 tablets (1,000 mg total) by mouth every 8 (eight) hours as needed.   albuterol  108 (90 Base) MCG/ACT inhaler Commonly known as: VENTOLIN  HFA Inhale 2 puffs into the lungs every 6 (six) hours as needed for wheezing or shortness of breath.   albuterol  (2.5 MG/3ML) 0.083% nebulizer solution Commonly known as: PROVENTIL  Take 3 mLs (2.5 mg total) by nebulization every 6 (six) hours as needed for wheezing or shortness of breath.   ARIPiprazole  2 MG tablet Commonly known as: ABILIFY  Take 2 mg by mouth daily.   aspirin  EC 81 MG tablet Take 1 tablet (81 mg total) by mouth every 12 (twelve) hours. What changed: when to take this   atorvastatin  40 MG tablet Commonly known as: LIPITOR Take 40 mg by mouth daily.   busPIRone  10 MG tablet Commonly known as: BUSPAR  Take 5 mg by mouth 2 (two) times daily.   calcium -vitamin D  500-200 MG-UNIT tablet Commonly known as: OSCAL WITH D Take 1 tablet by mouth daily with breakfast.   cetirizine 10 MG tablet Commonly known as: ZYRTEC Take 10 mg by mouth daily.   chlorhexidine  4 % external liquid Commonly known as: HIBICLENS  Apply 15 mLs (1 Application total) topically as directed  for 30 doses. Use as directed daily for 5 days every other week for 6 weeks. What changed: Another medication with the same name was added. Make sure you understand how and when to take each.   chlorhexidine  4 % external liquid Commonly known as: HIBICLENS  Apply  15 mLs (1 Application total) topically as directed for 30 doses. Use as directed daily for 5 days every other week for 6 weeks. What changed: You were already taking a medication with the same name, and this prescription was added. Make sure you understand how and when to take each.   doxycycline  100 MG tablet Commonly known as: VIBRA -TABS Take 1 tablet (100 mg total) by mouth 2 (two) times daily for 7 days.   eszopiclone 2 MG Tabs tablet Commonly known as: LUNESTA Take 2 mg by mouth at bedtime as needed for sleep. Take immediately before bedtime   FISH OIL PO Take 1,000 mg by mouth 2 (two) times daily.   fluticasone  50 MCG/ACT nasal spray Commonly known as: FLONASE  Place 2 sprays into both nostrils daily.   furosemide  40 MG tablet Commonly known as: LASIX  Take 40 mg by mouth daily as needed for fluid.   gabapentin  300 MG capsule Commonly known as: NEURONTIN  Take 900 mg by mouth 3 (three) times daily.   GLUCOSAMINE-CHONDROITIN PO Take 1 tablet by mouth at bedtime.   lisinopril  40 MG tablet Commonly known as: ZESTRIL  Take 20 mg by mouth daily.   methocarbamol  500 MG tablet Commonly known as: ROBAXIN  Take 1 tablet (500 mg total) by mouth every 8 (eight) hours as needed for up to 10 days for muscle spasms.   miconazole 2 % cream Commonly known as: MICOTIN Apply 1 application topically 2 (two) times daily as needed (fungus).   montelukast  10 MG tablet Commonly known as: SINGULAIR  Take 10 mg by mouth daily.   multivitamin with minerals Tabs tablet Take 1 tablet by mouth daily.   mupirocin  ointment 2 % Commonly known as: BACTROBAN  Place 1 Application into the nose 2 (two) times daily for 60 doses. Use as  directed 2 times daily for 5 days every other week for 6 weeks. What changed: Another medication with the same name was added. Make sure you understand how and when to take each.   mupirocin  ointment 2 % Commonly known as: BACTROBAN  Place 1 Application into the nose 2 (two) times daily for 60 doses. Use as directed 2 times daily for 5 days every other week for 6 weeks. What changed: You were already taking a medication with the same name, and this prescription was added. Make sure you understand how and when to take each.   nystatin cream Commonly known as: MYCOSTATIN Apply 1 Application topically 2 (two) times daily as needed for dry skin.   omeprazole  20 MG capsule Commonly known as: PRILOSEC Take 40 mg by mouth daily before breakfast.   ondansetron  4 MG tablet Commonly known as: Zofran  Take 1 tablet (4 mg total) by mouth every 8 (eight) hours as needed for up to 14 days for nausea or vomiting.   oxyBUTYnin  Chloride 2.5 MG Tabs Take 2.5 mg by mouth daily.   oxyCODONE  5 MG immediate release tablet Commonly known as: Roxicodone  Take 1 tablet (5 mg total) by mouth every 4 (four) hours as needed for up to 7 days for severe pain (pain score 7-10) or moderate pain (pain score 4-6).   polyethylene glycol 17 g packet Commonly known as: MiraLax  Take 17 g by mouth daily.   sertraline  100 MG tablet Commonly known as: ZOLOFT  Take 100 mg by mouth 2 (two) times daily.   Trelegy Ellipta  100-62.5-25 MCG/ACT Aepb Generic drug: Fluticasone -Umeclidin-Vilant Inhale 1 puff into the lungs daily.        Diagnostic Studies: DG Knee 1-2 Views  Left Result Date: 09/02/2023 CLINICAL DATA:  Postop. EXAM: LEFT KNEE - 1-2 VIEW COMPARISON:  Radiograph 08/26/2023 FINDINGS: Revision knee arthroplasty in expected alignment. Again seen screw within the distal medial femoral condyle. No periprosthetic fracture or lucency. Small joint effusion. Generalized soft tissue edema persists. Overlying wound VAC  appears removed. IMPRESSION: Revision knee arthroplasty in expected alignment. Electronically Signed   By: Chadwick Colonel M.D.   On: 09/02/2023 09:21   US  Abdomen Complete Result Date: 08/31/2023 CLINICAL DATA:  [id number] AKI (acute kidney injury) [facility]) [id number] Transaminitis [id number] EXAM: ABDOMEN ULTRASOUND COMPLETE COMPARISON:  March 21, 2004 FINDINGS: Gallbladder: Surgically absent. Common bile duct: Diameter: Visualized portion measures 12 mm, dilated. Liver: No focal lesion identified. Within normal limits in parenchymal echogenicity. Portal vein is patent on color Doppler imaging with normal direction of blood flow towards the liver. IVC: No abnormality visualized. Pancreas: Visualized portion unremarkable. Spleen: Spleen is homogeneously enlarged and spans 15.9 x 7.1 x 16.4 cm for an estimated volume of 961 ML Right Kidney: Length: 9.0 cm. Echogenicity within normal limits. No mass or hydronephrosis visualized. Left Kidney: Length: 11.3 cm. Echogenicity within normal limits. No hydronephrosis visualized. Benign cyst measuring up to 13 mm (for which no dedicated imaging follow-up is recommended). Abdominal aorta: No aneurysm visualized. Other findings: None. IMPRESSION: 1. Splenomegaly. 2. Dilated common bile duct measuring up to 12 mm. This is nonspecific and may reflect postcholecystectomy state. Recommend correlation with LFTs. Electronically Signed   By: Clancy Crimes M.D.   On: 08/31/2023 07:49   DG CHEST PORT 1 VIEW Result Date: 08/30/2023 CLINICAL DATA:  Hypoxia EXAM: PORTABLE CHEST 1 VIEW COMPARISON:  02/16/2017 FINDINGS: Cardiac shadow is enlarged but accentuated by the portable technique. Aortic calcifications are noted. The lungs are clear bilaterally. Postsurgical changes in the cervical spine are seen. No bony abnormality is noted IMPRESSION: No acute abnormality seen. Electronically Signed   By: Violeta Grey M.D.   On: 08/30/2023 22:39   DG Knee Left Port Result Date:  08/26/2023 CLINICAL DATA:  Status post knee replacement. EXAM: PORTABLE LEFT KNEE - 1-2 VIEW COMPARISON:  Radiograph 02/09/2023 FINDINGS: Revision knee arthroplasty in expected alignment. No periprosthetic lucency or fracture. There has been patellar resurfacing. Recent postsurgical change includes air and edema in the soft tissues and joint space. Wound VAC anteriorly. Screws within the distal medial femur or present on prior exam, some of those screws may have been removed. IMPRESSION: Revision knee arthroplasty without immediate postoperative complication. Electronically Signed   By: Chadwick Colonel M.D.   On: 08/26/2023 17:59    Disposition: Discharge disposition: 03-Skilled Nursing Facility       Discharge Instructions     Call MD / Call 911   Complete by: As directed    If you experience chest pain or shortness of breath, CALL 911 and be transported to the hospital emergency room.  If you develope a fever above 101 F, pus (white drainage) or increased drainage or redness at the wound, or calf pain, call your surgeon's office.   Constipation Prevention   Complete by: As directed    Drink plenty of fluids.  Prune juice may be helpful.  You may use a stool softener, such as Colace (over the counter) 100 mg twice a day.  Use MiraLax  (over the counter) for constipation as needed.   Diet - low sodium heart healthy   Complete by: As directed    Increase activity slowly as tolerated   Complete by: As directed  Post-operative opioid taper instructions:   Complete by: As directed    POST-OPERATIVE OPIOID TAPER INSTRUCTIONS: It is important to wean off of your opioid medication as soon as possible. If you do not need pain medication after your surgery it is ok to stop day one. Opioids include: Codeine, Hydrocodone (Norco, Vicodin), Oxycodone (Percocet, oxycontin ) and hydromorphone  amongst others.  Long term and even short term use of opiods can cause: Increased pain  response Dependence Constipation Depression Respiratory depression And more.  Withdrawal symptoms can include Flu like symptoms Nausea, vomiting And more Techniques to manage these symptoms Hydrate well Eat regular healthy meals Stay active Use relaxation techniques(deep breathing, meditating, yoga) Do Not substitute Alcohol  to help with tapering If you have been on opioids for less than two weeks and do not have pain than it is ok to stop all together.  Plan to wean off of opioids This plan should start within one week post op of your joint replacement. Maintain the same interval or time between taking each dose and first decrease the dose.  Cut the total daily intake of opioids by one tablet each day Next start to increase the time between doses. The last dose that should be eliminated is the evening dose.           Follow-up Information     Murleen Arms, MD Follow up in 1 week(s).   Specialty: Orthopedic Surgery Contact information: [address] [place] [address] [phone]         [facility] Follow up.   Contact information: [phone]                   Discharge Instructions      INSTRUCTIONS AFTER JOINT REPLACEMENT   Remove items at home which could result in a fall. This includes throw rugs or furniture in walking pathways ICE to the affected joint every three hours while awake for 30 minutes at a time, for at least the first 3-5 days, and then as needed for pain and swelling.  Continue to use ice for pain and swelling. You may notice swelling that will progress down to the foot and ankle.  This is normal after surgery.  Elevate your leg when you are not up walking on it.   Continue to use the breathing machine you got in the hospital (incentive spirometer) which will help keep your temperature down.  It is common for your temperature to cycle up and down following surgery, especially at night when you are  not up moving around and exerting yourself.  The breathing machine keeps your lungs expanded and your temperature down.  DIET:  As you were doing prior to hospitalization, we recommend a well-balanced diet.  DRESSING / WOUND CARE / SHOWERING:  Keep the surgical dressing until follow up.  This has battery powered suction to be kept at 125 mmHg and lasts 7 days.  If the battery stops sooner than 7 days, then call our office for further instructions.    The dressing is also water  resistant, however it is best to shower with an extra covering, such as saran wrap to keep it dry.  IF THE DRESSING FALLS OFF or the wound gets wet inside, change the dressing with sterile gauze and call our office for further instruction.  Please use good hand washing techniques before changing the dressing.  Do not use any lotions or creams on the incision until instructed by your surgeon.  ACTIVITY  Increase activity slowly as tolerated, but follow the weight bearing instructions below.   No driving for 6 weeks or until further direction given by your physician.  You cannot drive while taking narcotics.  No lifting or carrying greater than 10 lbs. until further directed by your surgeon. Avoid periods of inactivity such as sitting longer than an hour when not asleep. This helps prevent blood clots.  You may return to work once you are authorized by your doctor.   WEIGHT BEARING: Weight bearing as tolerated with assist device (walker, cane, etc) as directed, use it as long as suggested by your surgeon or therapist, typically at least 4-6 weeks.  EXERCISES  Results after joint replacement surgery are often greatly improved when you follow the exercise, range of motion and muscle strengthening exercises prescribed by your doctor. Safety measures are also important to protect the joint from further injury. Any time any of these exercises cause you to have increased pain or swelling, decrease what you are doing until you  are comfortable again and then slowly increase them. If you have problems or questions, call your caregiver or physical therapist for advice.   Rehabilitation is important following a joint replacement. After just a few days of immobilization, the muscles of the leg can become weakened and shrink (atrophy).  These exercises are designed to build up the tone and strength of the thigh and leg muscles and to improve motion. Often times heat used for twenty to thirty minutes before working out will loosen up your tissues and help with improving the range of motion but do not use heat for the first two weeks following surgery (sometimes heat can increase post-operative swelling).   These exercises can be done on a training (exercise) mat, on the floor, on a table or on a bed. Use whatever works the best and is most comfortable for you.    Use music or television while you are exercising so that the exercises are a pleasant break in your day. This will make your life better with the exercises acting as a break in your routine that you can look forward to.   Perform all exercises about fifteen times, three times per day or as directed.  You should exercise both the operative leg and the other leg as well.  Exercises include:   Quad Sets - Tighten up the muscle on the front of the thigh (Quad) and hold for 5-10 seconds.   Straight Leg Raises - With your knee straight (if you were given a brace, keep it on), lift the leg to 60 degrees, hold for 3 seconds, and slowly lower the leg.  Perform this exercise against resistance later as your leg gets stronger.  Leg Slides: Lying on your back, slowly slide your foot toward your buttocks, bending your knee up off the floor (only go as far as is comfortable). Then slowly slide your foot back down until your leg is flat on the floor again.  Angel Wings: Lying on your back spread your legs to the side as far apart as you can without causing discomfort.  Hamstring Strength:   Lying on your back, push your heel against the floor with your leg straight by tightening up the muscles of your buttocks.  Repeat, but this time bend your knee to a comfortable angle, and push your heel against the floor.  You may put a pillow under the heel to make it more comfortable if necessary.   A rehabilitation program  following joint replacement surgery can speed recovery and prevent re-injury in the future due to weakened muscles. Contact your doctor or a physical therapist for more information on knee rehabilitation.   CONSTIPATION:  Constipation is defined medically as fewer than three stools per week and severe constipation as less than one stool per week.  Even if you have a regular bowel pattern at home, your normal regimen is likely to be disrupted due to multiple reasons following surgery.  Combination of anesthesia, postoperative narcotics, change in appetite and fluid intake all can affect your bowels.   YOU MUST use at least one of the following options; they are listed in order of increasing strength to get the job done.  They are all available over the counter, and you may need to use some, POSSIBLY even all of these options:    Drink plenty of fluids (prune juice may be helpful) and high fiber foods Colace 100 mg by mouth twice a day  Senokot for constipation as directed and as needed Dulcolax (bisacodyl ), take with full glass of water   Miralax  (polyethylene glycol) once or twice a day as needed.  If you have tried all these things and are unable to have a bowel movement in the first 3-4 days after surgery call either your surgeon or your primary doctor.    If you experience loose stools or diarrhea, hold the medications until you stool forms back up.  If your symptoms do not get better within 1 week or if they get worse, check with your doctor.  If you experience "the worst abdominal pain ever" or develop nausea or vomiting, please contact the office immediately for further  recommendations for treatment.  ITCHING:  If you experience itching with your medications, try taking only a single pain pill, or even half a pain pill at a time.  You can also use Benadryl  over the counter for itching or also to help with sleep.   TED HOSE STOCKINGS:  Use stockings on both legs until for at least 2 weeks or as directed by physician office. They may be removed at night for sleeping.  MEDICATIONS:  See your medication summary on the "After Visit Summary" that nursing will review with you.  You may have some home medications which will be placed on hold until you complete the course of blood thinner medication.  It is important for you to complete the blood thinner medication as prescribed.  Blood clot prevention (DVT Prophylaxis): After surgery you are at an increased risk for a blood clot. you were prescribed a blood thinner, Aspirin  81mg , to be taken twice daily for a total of 4 weeks from surgery to help reduce your risk of getting a blood clot.  Signs of a pulmonary embolus (blood clot in the lungs) include sudden short of breath, feeling lightheaded or dizzy, chest pain with a deep breath, rapid pulse rapid breathing.  Signs of a blood clot in your arms or legs include new unexplained swelling and cramping, warm, red or darkened skin around the painful area.  Please call the office or 911 right away if these signs or symptoms develop.  PRECAUTIONS:   If you experience chest pain or shortness of breath - call 911 immediately for transfer to the hospital emergency department.   If you develop a fever greater that 101 F, purulent drainage from wound, increased redness or drainage from wound, foul odor from the wound/dressing, or calf pain - CONTACT YOUR SURGEON.  FOLLOW-UP APPOINTMENTS:  If you do not already have a post-op appointment, please call the office for an appointment to be seen by your surgeon.  Guidelines for how soon to be  seen are listed in your "After Visit Summary", but are typically between 2-3 weeks after surgery.  If you have a specialized bandage, you may be told to follow up 1 week after surgery.  OTHER INSTRUCTIONS:  Knee Replacement:  Do not place pillow under knee, focus on keeping the knee straight while resting.  Place foam block, curve side up under heel at all times except when walking.  DO NOT modify, tear, cut, or change the foam block in any way.  POST-OPERATIVE OPIOID TAPER INSTRUCTIONS: It is important to wean off of your opioid medication as soon as possible. If you do not need pain medication after your surgery it is ok to stop day one. Opioids include: Codeine, Hydrocodone (Norco, Vicodin), Oxycodone (Percocet, oxycontin ) and hydromorphone  amongst others.  Long term and even short term use of opiods can cause: Increased pain response Dependence Constipation Depression Respiratory depression And more.  Withdrawal symptoms can include Flu like symptoms Nausea, vomiting And more Techniques to manage these symptoms Hydrate well Eat regular healthy meals Stay active Use relaxation techniques(deep breathing, meditating, yoga) Do Not substitute Alcohol  to help with tapering If you have been on opioids for less than two weeks and do not have pain than it is ok to stop all together.  Plan to wean off of opioids This plan should start within one week post op of your joint replacement. Maintain the same interval or time between taking each dose and first decrease the dose.  Cut the total daily intake of opioids by one tablet each day Next start to increase the time between doses. The last dose that should be eliminated is the evening dose.   MAKE SURE YOU:  Understand these instructions.  Get help right away if you are not doing well or get worse.    Thank you for letting us  be a part of your medical care team.  It is a privilege we respect greatly.  We hope these instructions will help  you stay on track for a fast and full recovery!           Signed: Coda Filler A Lynn Dixon 09/02/2023, 12:23 PM

## 2023-09-04 ENCOUNTER — Encounter (HOSPITAL_COMMUNITY): Payer: Self-pay | Admitting: Orthopedic Surgery

## 2023-09-04 LAB — CULTURE, BLOOD (ROUTINE X 2)
Culture: NO GROWTH
Culture: NO GROWTH

## 2023-09-08 DIAGNOSIS — T84033D Mechanical loosening of internal left knee prosthetic joint, subsequent encounter: Secondary | ICD-10-CM | POA: Diagnosis not present

## 2023-09-11 DIAGNOSIS — I10 Essential (primary) hypertension: Secondary | ICD-10-CM | POA: Diagnosis not present

## 2023-09-11 DIAGNOSIS — E7849 Other hyperlipidemia: Secondary | ICD-10-CM | POA: Diagnosis not present

## 2023-09-11 DIAGNOSIS — I209 Angina pectoris, unspecified: Secondary | ICD-10-CM | POA: Diagnosis not present

## 2023-09-14 DIAGNOSIS — M79605 Pain in left leg: Secondary | ICD-10-CM | POA: Diagnosis not present

## 2023-09-21 DIAGNOSIS — Z9981 Dependence on supplemental oxygen: Secondary | ICD-10-CM | POA: Diagnosis not present

## 2023-09-21 DIAGNOSIS — E785 Hyperlipidemia, unspecified: Secondary | ICD-10-CM | POA: Diagnosis not present

## 2023-09-21 DIAGNOSIS — Z87891 Personal history of nicotine dependence: Secondary | ICD-10-CM | POA: Diagnosis not present

## 2023-09-21 DIAGNOSIS — Z7982 Long term (current) use of aspirin: Secondary | ICD-10-CM | POA: Diagnosis not present

## 2023-09-21 DIAGNOSIS — I10 Essential (primary) hypertension: Secondary | ICD-10-CM | POA: Diagnosis not present

## 2023-09-21 DIAGNOSIS — J432 Centrilobular emphysema: Secondary | ICD-10-CM | POA: Diagnosis not present

## 2023-09-21 DIAGNOSIS — Z556 Problems related to health literacy: Secondary | ICD-10-CM | POA: Diagnosis not present

## 2023-09-21 DIAGNOSIS — T84033D Mechanical loosening of internal left knee prosthetic joint, subsequent encounter: Secondary | ICD-10-CM | POA: Diagnosis not present

## 2023-09-21 DIAGNOSIS — E119 Type 2 diabetes mellitus without complications: Secondary | ICD-10-CM | POA: Diagnosis not present

## 2023-09-21 DIAGNOSIS — M48061 Spinal stenosis, lumbar region without neurogenic claudication: Secondary | ICD-10-CM | POA: Diagnosis not present

## 2023-09-21 DIAGNOSIS — Z791 Long term (current) use of non-steroidal anti-inflammatories (NSAID): Secondary | ICD-10-CM | POA: Diagnosis not present

## 2023-09-22 ENCOUNTER — Ambulatory Visit: Attending: Nurse Practitioner | Admitting: Nurse Practitioner

## 2023-09-22 ENCOUNTER — Encounter: Payer: Self-pay | Admitting: Nurse Practitioner

## 2023-09-22 VITALS — BP 107/73 | HR 53 | Ht 60.0 in | Wt 203.0 lb

## 2023-09-22 DIAGNOSIS — Z79899 Other long term (current) drug therapy: Secondary | ICD-10-CM | POA: Diagnosis not present

## 2023-09-22 DIAGNOSIS — I10 Essential (primary) hypertension: Secondary | ICD-10-CM

## 2023-09-22 DIAGNOSIS — G4733 Obstructive sleep apnea (adult) (pediatric): Secondary | ICD-10-CM

## 2023-09-22 DIAGNOSIS — Z791 Long term (current) use of non-steroidal anti-inflammatories (NSAID): Secondary | ICD-10-CM | POA: Diagnosis not present

## 2023-09-22 DIAGNOSIS — R0609 Other forms of dyspnea: Secondary | ICD-10-CM

## 2023-09-22 DIAGNOSIS — Z87898 Personal history of other specified conditions: Secondary | ICD-10-CM | POA: Diagnosis not present

## 2023-09-22 DIAGNOSIS — M48061 Spinal stenosis, lumbar region without neurogenic claudication: Secondary | ICD-10-CM | POA: Diagnosis not present

## 2023-09-22 DIAGNOSIS — R790 Abnormal level of blood mineral: Secondary | ICD-10-CM

## 2023-09-22 DIAGNOSIS — E119 Type 2 diabetes mellitus without complications: Secondary | ICD-10-CM | POA: Diagnosis not present

## 2023-09-22 DIAGNOSIS — Z87891 Personal history of nicotine dependence: Secondary | ICD-10-CM | POA: Diagnosis not present

## 2023-09-22 DIAGNOSIS — J449 Chronic obstructive pulmonary disease, unspecified: Secondary | ICD-10-CM

## 2023-09-22 DIAGNOSIS — J432 Centrilobular emphysema: Secondary | ICD-10-CM | POA: Diagnosis not present

## 2023-09-22 DIAGNOSIS — Z7982 Long term (current) use of aspirin: Secondary | ICD-10-CM | POA: Diagnosis not present

## 2023-09-22 DIAGNOSIS — T84033D Mechanical loosening of internal left knee prosthetic joint, subsequent encounter: Secondary | ICD-10-CM | POA: Diagnosis not present

## 2023-09-22 DIAGNOSIS — E785 Hyperlipidemia, unspecified: Secondary | ICD-10-CM | POA: Diagnosis not present

## 2023-09-22 DIAGNOSIS — Z9981 Dependence on supplemental oxygen: Secondary | ICD-10-CM | POA: Diagnosis not present

## 2023-09-22 DIAGNOSIS — Z556 Problems related to health literacy: Secondary | ICD-10-CM | POA: Diagnosis not present

## 2023-09-22 MED ORDER — MAGNESIUM OXIDE -MG SUPPLEMENT 400 (240 MG) MG PO TABS: 400.0000 mg | ORAL_TABLET | Freq: Every day | ORAL | 2 refills | Status: AC

## 2023-09-22 NOTE — Progress Notes (Unsigned)
 Cardiology Office Note:  .   Date:  09/22/2023 ID:  Troy Furnish, DOB 1950-12-20, MRN [id number] PCP: Alston Jerry, MD  [facility] Providers Cardiologist:  Armida Lander, MD    History of Present Illness: .   Lynn Dixon is a 73 y.o. female with a PMH of chest pain, hypertension, hyperlipidemia, history of remote tobacco abuse, DOE, COPD, and OSA, presents today for preoperative cardiovascular risk assessment.  History of extensive cardiac workup for symptoms of chest pain/DOE.  Overall workup revealed benign findings.  Was felt that symptoms were primarily pulmonology in etiology, related to OSA and COPD.  Last seen by Dr. Armida Lander on May 15, 2022.  Was overall doing well at that time.  Presents today for follow-up.  Since I have last seen her, she had revision left total knee replacement.  Doing well since surgery.  Denies any chest pain, worsening shortness of breath, palpitations, syncope, presyncope, dizziness, orthopnea, PND, swelling or significant weight changes, acute bleeding, or claudication.  ROS: Negative. See HPI.   SH: Her and her husband will be celebrating 66 years of marriage this October 2025.  Will be taking a trip to [facility].  They have a timeshare there were family vacations regularly.  Studies Reviewed: Aaron Aas    EKG: EKG is not ordered today.  Echo 05/2023: 1. Images are limited.   2. Left ventricular ejection fraction, by estimation, is 55 to 60%. The  left ventricle has normal function. Left ventricular endocardial border  not optimally defined to evaluate regional wall motion. Left ventricular  diastolic parameters are  indeterminate.   3. Right ventricular systolic function is normal. The right ventricular  size is normal. There is normal pulmonary artery systolic pressure. The  estimated right ventricular systolic pressure is 26.6 mmHg.   4. Left atrial size was upper normal.   5. The mitral valve is grossly normal. Trivial  mitral valve  regurgitation.   6. The aortic valve was not well visualized. Aortic valve regurgitation  is not visualized. Aortic valve mean gradient measures 5.0 mmHg.   7. The inferior vena cava is normal in size with greater than 50%  respiratory variability, suggesting right atrial pressure of 3 mmHg.   Comparison(s): Prior images reviewed side by side. LVEF normal range at 55-60%.  Echo 05/2020:  1. Left ventricular ejection fraction, by estimation, is 65 to 70%. The  left ventricle has normal function. The left ventricle has no regional  wall motion abnormalities. Left ventricular diastolic parameters were  normal.   2. Right ventricular systolic function is normal. The right ventricular  size is normal.   3. Left atrial size was mildly dilated.   4. The mitral valve is normal in structure. No evidence of mitral valve  regurgitation. No evidence of mitral stenosis.   5. The aortic valve was not well visualized. There is mild calcification  of the aortic valve. There is mild thickening of the aortic valve. Aortic  valve regurgitation is not visualized. No aortic stenosis is present.  Right/left heart cath 04/2018:  Mild right heart pressure elevation with mild pulmonary hypertension.   Normal LV function without focal segmental wall motion abnormalities; LVEDP 21 mmHg   Normal coronary arteries.   RECOMMENDATION: Medical therapy.  Suspect the patient's underlying COPD may be contributing to her exertional dyspnea.  Physical Exam:   VS:  BP 107/73 (BP Location: Left Arm, Patient Position: Sitting, Cuff Size: Large)   Pulse (!) 53  Ht 5' (1.524 m)   Wt 203 lb (92.1 kg)   SpO2 93%   BMI 39.65 kg/m    Wt Readings from Last 3 Encounters:  09/22/23 203 lb (92.1 kg)  08/26/23 194 lb 0.1 oz (88 kg)  08/24/23 194 lb 0.1 oz (88 kg)    GEN: Morbidly obese, 73 y.o. female in no acute distress NECK: No JVD; No carotid bruits CARDIAC: S1/S2, RRR, no murmurs, rubs,  gallops RESPIRATORY:  Clear to auscultation without rales, wheezing or rhonchi  ABDOMEN: Soft, non-tender, non-distended EXTREMITIES:  No edema; No deformity   ASSESSMENT AND PLAN: .    2. Hx of chest pain, DOE Denies any chest pain, breathing is stable per her report.  Right and left heart cath in 2020 showed normal coronary arteries.  See most recent echo report noted above.  No medication changes at this time. Care and ED precautions discussed.  Follow-up with PCP and pulmonology as scheduled.  3. HTN Blood pressure is well-controlled. Discussed to monitor BP at home at least 2 hours after medications and sitting for 5-10 minutes.  No medication changes at this time. Heart healthy diet and regular cardiovascular exercise encouraged. Continue to follow with PCP.  4. COPD/OSA Admits to stable dyspnea on exertion as noted above.  Recommended to follow-up with PCP and pulmonology.  No medication changes at this time. Care and ED precautions discussed.   5. Morbid obesity Weight loss via diet and exercise encouraged. Discussed the impact being overweight would have on cardiovascular risk.  6. Low magnesium  levels, medication management Mag level 3 weeks ago was 1.6. Will start Magnesium  Oxide 400 mg daily and repeat Mag level in 1 week. Continue to follow with PCP.   Dispo: Follow-up with MD/APP in 6 months or sooner if anything changes.  Signed, Lasalle Pointer, NP

## 2023-09-22 NOTE — Patient Instructions (Signed)
 Medication Instructions:  START Magnesium  400 mg daily  Labwork: Repeat magnesium  level in 1-2 weeks  Testing/Procedures: None today  Follow-Up: 6 months  Any Other Special Instructions Will Be Listed Below (If Applicable).  If you need a refill on your cardiac medications before your next appointment, please call your pharmacy.

## 2023-09-24 ENCOUNTER — Encounter: Payer: Self-pay | Admitting: Internal Medicine

## 2023-09-24 ENCOUNTER — Ambulatory Visit: Admitting: Internal Medicine

## 2023-09-24 VITALS — BP 105/64 | HR 58 | Ht 60.0 in | Wt 208.0 lb

## 2023-09-24 DIAGNOSIS — Z87891 Personal history of nicotine dependence: Secondary | ICD-10-CM

## 2023-09-24 DIAGNOSIS — Z9989 Dependence on other enabling machines and devices: Secondary | ICD-10-CM

## 2023-09-24 DIAGNOSIS — J301 Allergic rhinitis due to pollen: Secondary | ICD-10-CM | POA: Diagnosis not present

## 2023-09-24 DIAGNOSIS — J439 Emphysema, unspecified: Secondary | ICD-10-CM | POA: Diagnosis not present

## 2023-09-24 DIAGNOSIS — J9611 Chronic respiratory failure with hypoxia: Secondary | ICD-10-CM

## 2023-09-24 DIAGNOSIS — G4733 Obstructive sleep apnea (adult) (pediatric): Secondary | ICD-10-CM | POA: Diagnosis not present

## 2023-09-24 DIAGNOSIS — J4489 Other specified chronic obstructive pulmonary disease: Secondary | ICD-10-CM | POA: Diagnosis not present

## 2023-09-24 MED ORDER — SALINE SPRAY 0.65 % NA SOLN: 1.0000 | NASAL | 11 refills | Status: AC | PRN

## 2023-09-24 NOTE — Progress Notes (Signed)
 BENNETT VANSCYOC    [id number]    1950-09-22  Primary Care Physician:Burdine, Maceo Sax, MD Date of Appointment: 09/24/2023 Established Patient Visit  Chief complaint:   Chief Complaint  Patient presents with   Follow-up    COPD, SOB     HPI: Lynn Dixon is a 73 y.o. woman Gold stage 0 COPD. 80 pack year smoking history, quit in 2005. Has OSA on CPAP. Chronic respiratory failure on 2LNC.   Interval Updates: Here for follow up for COPD. Had her knee replacement and is having complications of pain. Due for xray later today. Doing outpatient PT and is now back home.   On trelegy 1 puff once daily. Albuterol  use 1-2 times/day.   We adjusted CPAP settings to 5-15 from 5-20 and she has a new mask which is helping with leak.   No interval exacerbations requiring prednisone  since I saw her 4 months ago but had 4-5 exacerbations in the last year 2024. Also has dyspnea with minimal exertion and ADLs.   She is having some nasal congestion related allergies. On flonase  and montelukast  daily with cetirizine.    CPAP reviewed with auto cpap 14 cm H20, 73% adherence, EPR 1, AH 4.2, she still has median leak of 35.9/minute but this is improved from previous and patient reports improved benefit and reduced leak after mask change.   Current DME - [facility]  I have reviewed the patient's family social and past medical history and updated as appropriate.   Past Medical History:  Diagnosis Date   Abnormal stress test    Allergy    Arthritis    BACK   Chronic neck pain    Constipation    takes Sennokot daily   COPD (chronic obstructive pulmonary disease) (HCC)    Depression    PTSD-takes Zoloft  daily   Diabetes mellitus without complication (HCC)    diet controled   Dyspnea    GERD (gastroesophageal reflux disease)    Glaucoma    mild in left eye   H/O hiatal hernia    History of blood transfusion    History of bronchitis    History of cardiac cath    a. normal cors  by cath in 04/2018 following an abnormal NST.   History of colon polyps    benign   History of echocardiogram    Echocardiogram 2/22: EF 65-70, no RWMA, normal diastolic function, normal RVSF, mild LAE, inadequate TR jet (indeterminate PASP)   Hyperlipidemia    Hypertension    Morbid obesity (HCC)    PONV (postoperative nausea and vomiting)    Sleep apnea    USES CPAP   Weakness    numbness and tingling. Takes Gabapentin  daily    Past Surgical History:  Procedure Laterality Date   ANTERIOR CERVICAL DECOMP/DISCECTOMY FUSION N/A 07/23/2016   Procedure: CERVICAL THREE-FOUR, CERVICAL FOUR-FIVE, CERVICAL FIVE-SIX ANTERIOR CERVICAL DECOMPRESSION/DISCECTOMY FUSION;  Surgeon: Agustina Aldrich, MD;  Location: [facility] OR;  Service: Neurosurgery;  Laterality: N/A;   APPENDECTOMY     APPLICATION OF WOUND VAC Left 08/26/2023   Procedure: APPLICATION, WOUND VAC;  Surgeon: Murleen Arms, MD;  Location: [facility] ORS;  Service: Orthopedics;  Laterality: Left;   BACK SURGERY     x 4   BREAST REDUCTION SURGERY     CARPEL TUNNEL Bilateral    CATARACT EXTRACTION, BILATERAL  spring 2018   Dr. Consuela Denier   CHOLECYSTECTOMY     COLONOSCOPY  ESOPHAGOGASTRODUODENOSCOPY     LUMBAR LAMINECTOMY/DECOMPRESSION MICRODISCECTOMY Left 12/03/2012   Procedure: Left Lumbar Four to Five Diskectomy;  Surgeon: Adelbert Adler, MD;  Location: [facility] NEURO ORS;  Service: Neurosurgery;  Laterality: Left;  LUMBAR LAMINECTOMY/DECOMPRESSION MICRODISCECTOMY 1 LEVEL   RIGHT/LEFT HEART CATH AND CORONARY ANGIOGRAPHY N/A 05/12/2018   Procedure: RIGHT/LEFT HEART CATH AND CORONARY ANGIOGRAPHY;  Surgeon: Millicent Ally, MD;  Location: [facility] INVASIVE CV LAB;  Service: Cardiovascular;  Laterality: N/A;   TOTAL KNEE ARTHROPLASTY Right    TOTAL KNEE ARTHROPLASTY Left 02/27/2017   Procedure: LEFT TOTAL KNEE ARTHROPLASTY;  Surgeon: Marlena Sima, MD;  Location: [facility] OR;  Service: Orthopedics;  Laterality: Left;   TOTAL KNEE REVISION Left 08/26/2023    Procedure: TOTAL KNEE REVISION;  Surgeon: Murleen Arms, MD;  Location: [facility] ORS;  Service: Orthopedics;  Laterality: Left;    Family History  Problem Relation Age of Onset   Heart disease Mother    Stroke Mother    Emphysema Father    Pneumonia Father    Stroke Maternal Grandmother     Social History   Occupational History   Not on file  Tobacco Use   Smoking status: Former    Current packs/day: 0.00    Average packs/day: 2.0 packs/day for 40.0 years (80.0 ttl pk-yrs)    Types: Cigarettes    Start date: 05/17/1964    Quit date: 05/17/2004    Years since quitting: 19.3   Smokeless tobacco: Never  Vaping Use   Vaping status: Never Used  Substance and Sexual Activity   Alcohol  use: No   Drug use: No   Sexual activity: Not on file     Physical Exam: Blood pressure 105/64, pulse (!) 58, height 5' (1.524 m), weight 208 lb (94.3 kg), SpO2 95%.  Gen:      Chronically ill appearing, no distress, obese, on nasal cannula Lungs:    diminished, no wheezes or crackles CV:        RRR no mrg   Data Reviewed: Imaging: I have personally reviewed the chest xray nov 2018 shows no acute cardiopulmonary process, cervical hardware in place  PFTs:      Latest Ref Rng & Units 07/20/2019    1:30 PM 06/09/2018   10:41 AM  PFT Results  FVC-Pre L 2.59  2.73   FVC-Predicted Pre % 115  120   FVC-Post L 2.49  2.74   FVC-Predicted Post % 111  120   Pre FEV1/FVC % % 72  74   Post FEV1/FCV % % 78  72   FEV1-Pre L 1.87  2.03   FEV1-Predicted Pre % 111  118   FEV1-Post L 1.93  1.96   DLCO uncorrected ml/min/mmHg 13.03  9.78   DLCO UNC% % 84  63   DLCO corrected ml/min/mmHg 13.03    DLCO COR %Predicted % 84    DLVA Predicted % 76  64   TLC L 4.35  5.03   TLC % Predicted % 106  123   RV % Predicted % 81  119    I have personally reviewed the patient's PFTs and they are normal spirometry, lung volumes, diffusion capacity  Echocardiogram Feb 2021 shows:   1. Left ventricular  ejection fraction, by estimation, is 65 to 70%. The  left ventricle has normal function. The left ventricle has no regional  wall motion abnormalities. Left ventricular diastolic parameters were  normal.   2. Right ventricular systolic function is normal. The right ventricular  size is normal.   3. Left atrial size was mildly dilated.   4. The mitral valve is normal in structure. No evidence of mitral valve  regurgitation. No evidence of mitral stenosis.   5. The aortic valve was not well visualized. There is mild calcification  of the aortic valve. There is mild thickening of the aortic valve. Aortic  valve regurgitation is not visualized. No aortic stenosis is present.   Labs: Lab Results  Component Value Date   NA 136 09/02/2023   K 3.7 09/02/2023   CO2 22 09/02/2023   GLUCOSE 112 (H) 09/02/2023   BUN 19 09/02/2023   CREATININE 0.70 09/02/2023   CALCIUM  9.0 09/02/2023   GFRNONAA >60 09/02/2023   Lab Results  Component Value Date   WBC 7.8 09/02/2023   HGB 8.4 (L) 09/02/2023   HCT 25.8 (L) 09/02/2023   MCV 89.3 09/02/2023   PLT 147 (L) 09/02/2023    Immunization status: Immunization History  Administered Date(s) Administered   Fluad Quad(high Dose 65+) 03/17/2019   Influenza,inj,Quad PF,6+ Mos 02/29/2016   Influenza-Unspecified 05/15/2023   Moderna Sars-Covid-2 Vaccination 05/29/2019, 06/26/2019   Pfizer(Comirnaty)Fall Seasonal Vaccine 12 years and older 05/15/2023    Assessment:  COPD Gold Stage 0 on PFTs with progression of symptoms OSA on CPAP, not well controlled.  GERD on PPI History of tobacco use disorder, 80 pack years, quit 2006 Chronic respiratory failure  Plan/Recommendations:  Continue trelegy inhaler 1 puff once daily.   Glad the CPAP is working well for you.   Continue albuterol  inhaler as needed.  We are adding a new nebulized medication twice daily called Ohtuvayre  to help with shortness of breath.   Once your knee feels better, exercise  will be very important in improving your heart and lung function and symptoms of shortness of breath.   Continue flonase , singulair , cetirizine for allergies. Add nasal saline spray throughout the day for nasal congestion and dryness.   Continue oxygen  therapy for goal saturations over 88%.   Continue PPI for GERD  She is outside the window for lung cancer screening, since she quit over 15 years ago.   Return to Care: No follow-ups on file.  Louie Rover, MD Pulmonary and Critical Care Medicine [facility] 09/24/2023 11:11 AM Pager: see AMION  If no response to pager, please call critical care on call (see AMION) until 7pm After 7:00 pm call Elink

## 2023-09-24 NOTE — Patient Instructions (Signed)
 It was a pleasure to see you today!  Please schedule follow up with myself in 4 months.  If my schedule is not open yet, we will contact you with a reminder closer to that time. Please call [phone] if you haven't heard from us  a month before, and always call us  sooner if issues or concerns arise. You can also send us  a message through MyChart, but but aware that this is not to be used for urgent issues and it may take up to 5-7 days to receive a reply. Please be aware that you will likely be able to view your results before I have a chance to respond to them. Please give us  5 business days to respond to any non-urgent results.     Continue trelegy inhaler 1 puff once daily.   Glad the CPAP is working well for you.   Continue albuterol  inhaler as needed.  We are adding a new nebulized medication twice daily called Ohtuvayre  to help with shortness of breath.   Once your knee feels better, exercise will be very important in improving your heart and lung function and symptoms of shortness of breath.   Continue flonase , singulair , cetirizine for allergies. Add nasal saline spray throughout the day for nasal congestion and dryness.   Continue oxygen  therapy for goal saturations over 88%.   Continue PPI for GERD

## 2023-09-25 DIAGNOSIS — T84033D Mechanical loosening of internal left knee prosthetic joint, subsequent encounter: Secondary | ICD-10-CM | POA: Diagnosis not present

## 2023-09-25 DIAGNOSIS — L03116 Cellulitis of left lower limb: Secondary | ICD-10-CM | POA: Diagnosis not present

## 2023-09-25 DIAGNOSIS — Z7982 Long term (current) use of aspirin: Secondary | ICD-10-CM | POA: Diagnosis not present

## 2023-09-25 DIAGNOSIS — M7989 Other specified soft tissue disorders: Secondary | ICD-10-CM | POA: Diagnosis not present

## 2023-09-25 DIAGNOSIS — M48061 Spinal stenosis, lumbar region without neurogenic claudication: Secondary | ICD-10-CM | POA: Diagnosis not present

## 2023-09-25 DIAGNOSIS — E785 Hyperlipidemia, unspecified: Secondary | ICD-10-CM | POA: Diagnosis not present

## 2023-09-25 DIAGNOSIS — Z96652 Presence of left artificial knee joint: Secondary | ICD-10-CM | POA: Diagnosis not present

## 2023-09-25 DIAGNOSIS — Z87891 Personal history of nicotine dependence: Secondary | ICD-10-CM | POA: Diagnosis not present

## 2023-09-25 DIAGNOSIS — I10 Essential (primary) hypertension: Secondary | ICD-10-CM | POA: Diagnosis not present

## 2023-09-25 DIAGNOSIS — Z9981 Dependence on supplemental oxygen: Secondary | ICD-10-CM | POA: Diagnosis not present

## 2023-09-25 DIAGNOSIS — D649 Anemia, unspecified: Secondary | ICD-10-CM | POA: Diagnosis not present

## 2023-09-25 DIAGNOSIS — R338 Other retention of urine: Secondary | ICD-10-CM | POA: Diagnosis not present

## 2023-09-25 DIAGNOSIS — E119 Type 2 diabetes mellitus without complications: Secondary | ICD-10-CM | POA: Diagnosis not present

## 2023-09-25 DIAGNOSIS — Z791 Long term (current) use of non-steroidal anti-inflammatories (NSAID): Secondary | ICD-10-CM | POA: Diagnosis not present

## 2023-09-25 DIAGNOSIS — J432 Centrilobular emphysema: Secondary | ICD-10-CM | POA: Diagnosis not present

## 2023-09-25 DIAGNOSIS — Z556 Problems related to health literacy: Secondary | ICD-10-CM | POA: Diagnosis not present

## 2023-09-28 DIAGNOSIS — Z9981 Dependence on supplemental oxygen: Secondary | ICD-10-CM | POA: Diagnosis not present

## 2023-09-28 DIAGNOSIS — I10 Essential (primary) hypertension: Secondary | ICD-10-CM | POA: Diagnosis not present

## 2023-09-28 DIAGNOSIS — T84033D Mechanical loosening of internal left knee prosthetic joint, subsequent encounter: Secondary | ICD-10-CM | POA: Diagnosis not present

## 2023-09-28 DIAGNOSIS — Z791 Long term (current) use of non-steroidal anti-inflammatories (NSAID): Secondary | ICD-10-CM | POA: Diagnosis not present

## 2023-09-28 DIAGNOSIS — E119 Type 2 diabetes mellitus without complications: Secondary | ICD-10-CM | POA: Diagnosis not present

## 2023-09-28 DIAGNOSIS — E785 Hyperlipidemia, unspecified: Secondary | ICD-10-CM | POA: Diagnosis not present

## 2023-09-28 DIAGNOSIS — Z556 Problems related to health literacy: Secondary | ICD-10-CM | POA: Diagnosis not present

## 2023-09-28 DIAGNOSIS — M48061 Spinal stenosis, lumbar region without neurogenic claudication: Secondary | ICD-10-CM | POA: Diagnosis not present

## 2023-09-28 DIAGNOSIS — Z7982 Long term (current) use of aspirin: Secondary | ICD-10-CM | POA: Diagnosis not present

## 2023-09-28 DIAGNOSIS — J432 Centrilobular emphysema: Secondary | ICD-10-CM | POA: Diagnosis not present

## 2023-09-28 DIAGNOSIS — Z87891 Personal history of nicotine dependence: Secondary | ICD-10-CM | POA: Diagnosis not present

## 2023-09-29 ENCOUNTER — Telehealth: Payer: Self-pay

## 2023-09-29 DIAGNOSIS — I10 Essential (primary) hypertension: Secondary | ICD-10-CM | POA: Diagnosis not present

## 2023-09-29 DIAGNOSIS — Z9981 Dependence on supplemental oxygen: Secondary | ICD-10-CM | POA: Diagnosis not present

## 2023-09-29 DIAGNOSIS — Z87891 Personal history of nicotine dependence: Secondary | ICD-10-CM | POA: Diagnosis not present

## 2023-09-29 DIAGNOSIS — J432 Centrilobular emphysema: Secondary | ICD-10-CM | POA: Diagnosis not present

## 2023-09-29 DIAGNOSIS — Z791 Long term (current) use of non-steroidal anti-inflammatories (NSAID): Secondary | ICD-10-CM | POA: Diagnosis not present

## 2023-09-29 DIAGNOSIS — T84033D Mechanical loosening of internal left knee prosthetic joint, subsequent encounter: Secondary | ICD-10-CM | POA: Diagnosis not present

## 2023-09-29 DIAGNOSIS — Z7982 Long term (current) use of aspirin: Secondary | ICD-10-CM | POA: Diagnosis not present

## 2023-09-29 DIAGNOSIS — Z556 Problems related to health literacy: Secondary | ICD-10-CM | POA: Diagnosis not present

## 2023-09-29 DIAGNOSIS — E119 Type 2 diabetes mellitus without complications: Secondary | ICD-10-CM | POA: Diagnosis not present

## 2023-09-29 DIAGNOSIS — E785 Hyperlipidemia, unspecified: Secondary | ICD-10-CM | POA: Diagnosis not present

## 2023-09-29 DIAGNOSIS — M48061 Spinal stenosis, lumbar region without neurogenic claudication: Secondary | ICD-10-CM | POA: Diagnosis not present

## 2023-09-29 NOTE — Telephone Encounter (Signed)
 Received Ohtuvayre new start paperwork. Completed form and faxed with clinicals and insurance card copy to [facility]   Phone#: [phone] Fax#: [phone]

## 2023-09-30 NOTE — Telephone Encounter (Signed)
 Received fax from [employer] that Ohtuvayre  enrollment form has been received  Patient ID: [id number]

## 2023-10-01 NOTE — Telephone Encounter (Signed)
 Received fax from [employer] with summary of benefits. Referral form for Ohtuvayre  received. Rx will be triaged to [facility].. Once benefits investigation completed, pharmacy will reach out the patient to schedule shipment. If medication is unaffordable, patient will need to express financial hardship to be referred back to [place] [facility] for patient assistance program pre-screening.   Patient ID: [id number] Pharmacy phone: [phone] [place] [facility] Phone#: [phone]

## 2023-10-05 DIAGNOSIS — E559 Vitamin D deficiency, unspecified: Secondary | ICD-10-CM | POA: Diagnosis not present

## 2023-10-05 DIAGNOSIS — N1832 Chronic kidney disease, stage 3b: Secondary | ICD-10-CM | POA: Diagnosis not present

## 2023-10-05 DIAGNOSIS — Z13220 Encounter for screening for lipoid disorders: Secondary | ICD-10-CM | POA: Diagnosis not present

## 2023-10-05 DIAGNOSIS — Z1329 Encounter for screening for other suspected endocrine disorder: Secondary | ICD-10-CM | POA: Diagnosis not present

## 2023-10-05 DIAGNOSIS — I10 Essential (primary) hypertension: Secondary | ICD-10-CM | POA: Diagnosis not present

## 2023-10-05 DIAGNOSIS — E1165 Type 2 diabetes mellitus with hyperglycemia: Secondary | ICD-10-CM | POA: Diagnosis not present

## 2023-10-05 DIAGNOSIS — E871 Hypo-osmolality and hyponatremia: Secondary | ICD-10-CM | POA: Diagnosis not present

## 2023-10-06 DIAGNOSIS — I10 Essential (primary) hypertension: Secondary | ICD-10-CM | POA: Diagnosis not present

## 2023-10-06 DIAGNOSIS — E785 Hyperlipidemia, unspecified: Secondary | ICD-10-CM | POA: Diagnosis not present

## 2023-10-06 DIAGNOSIS — Z7982 Long term (current) use of aspirin: Secondary | ICD-10-CM | POA: Diagnosis not present

## 2023-10-06 DIAGNOSIS — T84033D Mechanical loosening of internal left knee prosthetic joint, subsequent encounter: Secondary | ICD-10-CM | POA: Diagnosis not present

## 2023-10-06 DIAGNOSIS — Z791 Long term (current) use of non-steroidal anti-inflammatories (NSAID): Secondary | ICD-10-CM | POA: Diagnosis not present

## 2023-10-06 DIAGNOSIS — Z556 Problems related to health literacy: Secondary | ICD-10-CM | POA: Diagnosis not present

## 2023-10-06 DIAGNOSIS — Z87891 Personal history of nicotine dependence: Secondary | ICD-10-CM | POA: Diagnosis not present

## 2023-10-06 DIAGNOSIS — M48061 Spinal stenosis, lumbar region without neurogenic claudication: Secondary | ICD-10-CM | POA: Diagnosis not present

## 2023-10-06 DIAGNOSIS — J432 Centrilobular emphysema: Secondary | ICD-10-CM | POA: Diagnosis not present

## 2023-10-06 DIAGNOSIS — E119 Type 2 diabetes mellitus without complications: Secondary | ICD-10-CM | POA: Diagnosis not present

## 2023-10-06 DIAGNOSIS — Z9981 Dependence on supplemental oxygen: Secondary | ICD-10-CM | POA: Diagnosis not present

## 2023-10-13 DIAGNOSIS — T84033D Mechanical loosening of internal left knee prosthetic joint, subsequent encounter: Secondary | ICD-10-CM | POA: Diagnosis not present

## 2023-10-13 DIAGNOSIS — Z791 Long term (current) use of non-steroidal anti-inflammatories (NSAID): Secondary | ICD-10-CM | POA: Diagnosis not present

## 2023-10-13 DIAGNOSIS — Z556 Problems related to health literacy: Secondary | ICD-10-CM | POA: Diagnosis not present

## 2023-10-13 DIAGNOSIS — Z7982 Long term (current) use of aspirin: Secondary | ICD-10-CM | POA: Diagnosis not present

## 2023-10-13 DIAGNOSIS — M48061 Spinal stenosis, lumbar region without neurogenic claudication: Secondary | ICD-10-CM | POA: Diagnosis not present

## 2023-10-13 DIAGNOSIS — E785 Hyperlipidemia, unspecified: Secondary | ICD-10-CM | POA: Diagnosis not present

## 2023-10-13 DIAGNOSIS — E119 Type 2 diabetes mellitus without complications: Secondary | ICD-10-CM | POA: Diagnosis not present

## 2023-10-13 DIAGNOSIS — J432 Centrilobular emphysema: Secondary | ICD-10-CM | POA: Diagnosis not present

## 2023-10-13 DIAGNOSIS — Z87891 Personal history of nicotine dependence: Secondary | ICD-10-CM | POA: Diagnosis not present

## 2023-10-13 DIAGNOSIS — I10 Essential (primary) hypertension: Secondary | ICD-10-CM | POA: Diagnosis not present

## 2023-10-13 DIAGNOSIS — Z9981 Dependence on supplemental oxygen: Secondary | ICD-10-CM | POA: Diagnosis not present

## 2023-10-19 DIAGNOSIS — J9611 Chronic respiratory failure with hypoxia: Secondary | ICD-10-CM | POA: Diagnosis not present

## 2023-10-19 DIAGNOSIS — Z556 Problems related to health literacy: Secondary | ICD-10-CM | POA: Diagnosis not present

## 2023-10-19 DIAGNOSIS — I10 Essential (primary) hypertension: Secondary | ICD-10-CM | POA: Diagnosis not present

## 2023-10-19 DIAGNOSIS — E785 Hyperlipidemia, unspecified: Secondary | ICD-10-CM | POA: Diagnosis not present

## 2023-10-19 DIAGNOSIS — T84033D Mechanical loosening of internal left knee prosthetic joint, subsequent encounter: Secondary | ICD-10-CM | POA: Diagnosis not present

## 2023-10-19 DIAGNOSIS — Z9981 Dependence on supplemental oxygen: Secondary | ICD-10-CM | POA: Diagnosis not present

## 2023-10-19 DIAGNOSIS — J432 Centrilobular emphysema: Secondary | ICD-10-CM | POA: Diagnosis not present

## 2023-10-19 DIAGNOSIS — M48061 Spinal stenosis, lumbar region without neurogenic claudication: Secondary | ICD-10-CM | POA: Diagnosis not present

## 2023-10-19 DIAGNOSIS — E1165 Type 2 diabetes mellitus with hyperglycemia: Secondary | ICD-10-CM | POA: Diagnosis not present

## 2023-10-19 DIAGNOSIS — E119 Type 2 diabetes mellitus without complications: Secondary | ICD-10-CM | POA: Diagnosis not present

## 2023-10-19 DIAGNOSIS — Z7982 Long term (current) use of aspirin: Secondary | ICD-10-CM | POA: Diagnosis not present

## 2023-10-19 DIAGNOSIS — Z87891 Personal history of nicotine dependence: Secondary | ICD-10-CM | POA: Diagnosis not present

## 2023-10-19 DIAGNOSIS — Z791 Long term (current) use of non-steroidal anti-inflammatories (NSAID): Secondary | ICD-10-CM | POA: Diagnosis not present

## 2023-10-19 DIAGNOSIS — D649 Anemia, unspecified: Secondary | ICD-10-CM | POA: Diagnosis not present

## 2023-11-17 DIAGNOSIS — T84033D Mechanical loosening of internal left knee prosthetic joint, subsequent encounter: Secondary | ICD-10-CM | POA: Diagnosis not present

## 2023-12-14 DIAGNOSIS — E1165 Type 2 diabetes mellitus with hyperglycemia: Secondary | ICD-10-CM | POA: Diagnosis not present

## 2023-12-14 DIAGNOSIS — E7800 Pure hypercholesterolemia, unspecified: Secondary | ICD-10-CM | POA: Diagnosis not present

## 2023-12-14 DIAGNOSIS — J449 Chronic obstructive pulmonary disease, unspecified: Secondary | ICD-10-CM | POA: Diagnosis not present

## 2023-12-14 DIAGNOSIS — I10 Essential (primary) hypertension: Secondary | ICD-10-CM | POA: Diagnosis not present

## 2023-12-15 DIAGNOSIS — T84033D Mechanical loosening of internal left knee prosthetic joint, subsequent encounter: Secondary | ICD-10-CM | POA: Diagnosis not present

## 2024-01-12 DIAGNOSIS — I10 Essential (primary) hypertension: Secondary | ICD-10-CM | POA: Diagnosis not present

## 2024-01-12 DIAGNOSIS — N1832 Chronic kidney disease, stage 3b: Secondary | ICD-10-CM | POA: Diagnosis not present

## 2024-01-12 DIAGNOSIS — D649 Anemia, unspecified: Secondary | ICD-10-CM | POA: Diagnosis not present

## 2024-01-12 DIAGNOSIS — Z13220 Encounter for screening for lipoid disorders: Secondary | ICD-10-CM | POA: Diagnosis not present

## 2024-01-14 DIAGNOSIS — K5909 Other constipation: Secondary | ICD-10-CM | POA: Diagnosis not present

## 2024-01-14 DIAGNOSIS — E7800 Pure hypercholesterolemia, unspecified: Secondary | ICD-10-CM | POA: Diagnosis not present

## 2024-01-14 DIAGNOSIS — I10 Essential (primary) hypertension: Secondary | ICD-10-CM | POA: Diagnosis not present

## 2024-01-14 DIAGNOSIS — E1165 Type 2 diabetes mellitus with hyperglycemia: Secondary | ICD-10-CM | POA: Diagnosis not present

## 2024-01-14 DIAGNOSIS — K219 Gastro-esophageal reflux disease without esophagitis: Secondary | ICD-10-CM | POA: Diagnosis not present

## 2024-01-14 DIAGNOSIS — N3281 Overactive bladder: Secondary | ICD-10-CM | POA: Diagnosis not present

## 2024-01-14 DIAGNOSIS — J309 Allergic rhinitis, unspecified: Secondary | ICD-10-CM | POA: Diagnosis not present

## 2024-01-14 DIAGNOSIS — J449 Chronic obstructive pulmonary disease, unspecified: Secondary | ICD-10-CM | POA: Diagnosis not present

## 2024-01-14 DIAGNOSIS — G479 Sleep disorder, unspecified: Secondary | ICD-10-CM | POA: Diagnosis not present

## 2024-01-20 DIAGNOSIS — Z9189 Other specified personal risk factors, not elsewhere classified: Secondary | ICD-10-CM | POA: Diagnosis not present

## 2024-01-20 DIAGNOSIS — I10 Essential (primary) hypertension: Secondary | ICD-10-CM | POA: Diagnosis not present

## 2024-01-20 DIAGNOSIS — J9611 Chronic respiratory failure with hypoxia: Secondary | ICD-10-CM | POA: Diagnosis not present

## 2024-01-20 DIAGNOSIS — Z0001 Encounter for general adult medical examination with abnormal findings: Secondary | ICD-10-CM | POA: Diagnosis not present

## 2024-01-20 DIAGNOSIS — Z23 Encounter for immunization: Secondary | ICD-10-CM | POA: Diagnosis not present

## 2024-01-20 DIAGNOSIS — E559 Vitamin D deficiency, unspecified: Secondary | ICD-10-CM | POA: Diagnosis not present

## 2024-01-20 DIAGNOSIS — Z1389 Encounter for screening for other disorder: Secondary | ICD-10-CM | POA: Diagnosis not present

## 2024-02-03 DIAGNOSIS — E7800 Pure hypercholesterolemia, unspecified: Secondary | ICD-10-CM | POA: Diagnosis not present

## 2024-02-03 DIAGNOSIS — E1165 Type 2 diabetes mellitus with hyperglycemia: Secondary | ICD-10-CM | POA: Diagnosis not present

## 2024-02-03 DIAGNOSIS — J449 Chronic obstructive pulmonary disease, unspecified: Secondary | ICD-10-CM | POA: Diagnosis not present

## 2024-02-03 DIAGNOSIS — K5909 Other constipation: Secondary | ICD-10-CM | POA: Diagnosis not present

## 2024-02-03 DIAGNOSIS — B372 Candidiasis of skin and nail: Secondary | ICD-10-CM | POA: Diagnosis not present

## 2024-02-03 DIAGNOSIS — I10 Essential (primary) hypertension: Secondary | ICD-10-CM | POA: Diagnosis not present

## 2024-02-03 DIAGNOSIS — K219 Gastro-esophageal reflux disease without esophagitis: Secondary | ICD-10-CM | POA: Diagnosis not present

## 2024-02-03 DIAGNOSIS — R519 Headache, unspecified: Secondary | ICD-10-CM | POA: Diagnosis not present

## 2024-02-03 DIAGNOSIS — N3281 Overactive bladder: Secondary | ICD-10-CM | POA: Diagnosis not present

## 2024-02-03 DIAGNOSIS — G479 Sleep disorder, unspecified: Secondary | ICD-10-CM | POA: Diagnosis not present

## 2024-02-03 DIAGNOSIS — J309 Allergic rhinitis, unspecified: Secondary | ICD-10-CM | POA: Diagnosis not present

## 2024-02-09 ENCOUNTER — Encounter: Payer: Self-pay | Admitting: Neurology

## 2024-02-09 ENCOUNTER — Ambulatory Visit: Admitting: Neurology

## 2024-02-09 VITALS — BP 110/53 | HR 58 | Ht <= 58 in | Wt 204.2 lb

## 2024-02-09 DIAGNOSIS — J441 Chronic obstructive pulmonary disease with (acute) exacerbation: Secondary | ICD-10-CM

## 2024-02-09 DIAGNOSIS — G3184 Mild cognitive impairment, so stated: Secondary | ICD-10-CM

## 2024-02-09 DIAGNOSIS — R519 Headache, unspecified: Secondary | ICD-10-CM | POA: Diagnosis not present

## 2024-02-09 DIAGNOSIS — G4733 Obstructive sleep apnea (adult) (pediatric): Secondary | ICD-10-CM | POA: Diagnosis not present

## 2024-02-09 DIAGNOSIS — G44221 Chronic tension-type headache, intractable: Secondary | ICD-10-CM

## 2024-02-09 NOTE — Progress Notes (Signed)
 [facility]  PATIENT: Lynn Dixon DOB: November 07, 1950  REFERRING DOCTOR OR PCP:  Rocky Burow, FNP; Elspeth Messier, MD SOURCE: Patient, notes from primary care, [place] cognitive assessment reviewed  _________________________________   HISTORICAL  CHIEF COMPLAINT:  Chief Complaint  Patient presents with   New Patient (Initial Visit)    Pt in room 11. Lupita husband in room. Paper referral  frequent headaches, memory changes. Moca:19    HISTORY OF PRESENT ILLNESS:  I had the pleasure of seeing your patient, Lynn Dixon, at [facility] for neurologic consultation regarding her headache and memory loss.  She is a 73 year old woman with COPD, HTN, HLD, depressoin/PTSD with headache and memory change.   She has had right sided HA x 5-6 months.   These occurred daily x 2 weeks several months ago but still occurring x 2-3 days each week.  She has right scalp dysesthesia posteriorly nothing triggered a HA or seemed to make it worse.   When present, she had photophobia but no nausea.     She took Tylenol  with some benefit  She began to not isses with her memory 6 months ago but he husband felt she had some issues even 10 yearsa go and daughter noted issues 1 year ago.    She has no imaging I have access to but had a head CT after hitting her head, no LOC,  [facility])  She feels sleep is poor with sleep onset insomnia.   She sleep 6-8 hours most nights.   She uses CPAP with supplemental oxygen .   She has depression and anxiety and takes sertraline , Abilify  and buspirone .      Medications that might affect cognition:  gabapentin  300 mg po tid.  She used to be on oxybutynon but not presently      02/09/2024   11:16 AM  Montreal Cognitive Assessment   Visuospatial/ Executive (0/5) 3  Naming (0/3) 3  Attention: Read list of digits (0/2) 2  Attention: Read list of letters (0/1) 0  Attention: Serial 7 subtraction starting at 100 (0/3) 1  Language:  Repeat phrase (0/2) 2  Language : Fluency (0/1) 1  Abstraction (0/2) 1  Delayed Recall (0/5) 1  Orientation (0/6) 5  Total 19   STR was 3/5 spontaneous no better with prompt   REVIEW OF SYSTEMS: Constitutional: No fevers, chills, sweats, or change in appetite Eyes: No visual changes, double vision, eye pain Ear, nose and throat: No hearing loss, ear pain, nasal congestion, sore throat Cardiovascular: No chest pain, palpitations.  She has hypertension Respiratory: She has COPD and OSA.  She is on oxygen . GastrointestinaI: No nausea, vomiting, diarrhea, abdominal pain, fecal incontinence Genitourinary: Urinary frequency and urgency with rare incontinence Musculoskeletal: The right neck/occiput is tender  integumentary: No rash, pruritus, skin lesions Neurological: as above Psychiatric: No depression at this time.  No anxiety Endocrine: No palpitations, diaphoresis, change in appetite, change in weigh or increased thirst Hematologic/Lymphatic:  No anemia, purpura, petechiae. Allergic/Immunologic: She has seasonal allergies.  ALLERGIES: Allergies  Allergen Reactions   Coconut Flavoring Agent (Non-Screening) Anaphylaxis    Anything with coconut in it   Cephalosporins Hives   Morphine And Codeine     dizziness   Sulfa Antibiotics Hives and Rash   Sulfasalazine Hives and Rash   Ketorolac      Headaches and dizziness   Cefuroxime Axetil Itching and Rash   Estrogens Rash    HOME MEDICATIONS:  Current Outpatient Medications:  albuterol  (PROVENTIL ) (2.5 MG/3ML) 0.083% nebulizer solution, Take 3 mLs (2.5 mg total) by nebulization every 6 (six) hours as needed for wheezing or shortness of breath., Disp: 75 mL, Rfl: 12   albuterol  (VENTOLIN  HFA) 108 (90 Base) MCG/ACT inhaler, Inhale 2 puffs into the lungs every 6 (six) hours as needed for wheezing or shortness of breath., Disp: 18 g, Rfl: 11   ARIPiprazole  (ABILIFY ) 2 MG tablet, Take 2 mg by mouth daily., Disp: , Rfl:    aspirin  EC  81 MG tablet, Take 1 tablet (81 mg total) by mouth every 12 (twelve) hours., Disp: 60 tablet, Rfl: 11   atorvastatin  (LIPITOR) 40 MG tablet, Take 40 mg by mouth daily., Disp: , Rfl:    busPIRone  (BUSPAR ) 10 MG tablet, Take 5 mg by mouth 2 (two) times daily., Disp: , Rfl:    calcium -vitamin D  (OSCAL WITH D) 500-200 MG-UNIT tablet, Take 1 tablet by mouth daily with breakfast., Disp: , Rfl:    cetirizine (ZYRTEC) 10 MG tablet, Take 10 mg by mouth daily., Disp: , Rfl:    chlorhexidine  (HIBICLENS ) 4 % external liquid, Apply 15 mLs (1 Application total) topically as directed for 30 doses. Use as directed daily for 5 days every other week for 6 weeks., Disp: 946 mL, Rfl: 1   chlorhexidine  (HIBICLENS ) 4 % external liquid, Apply 15 mLs (1 Application total) topically as directed for 30 doses. Use as directed daily for 5 days every other week for 6 weeks., Disp: 946 mL, Rfl: 1   eszopiclone (LUNESTA) 2 MG TABS tablet, Take 2 mg by mouth at bedtime as needed for sleep. Take immediately before bedtime, Disp: , Rfl:    fluticasone  (FLONASE ) 50 MCG/ACT nasal spray, Place 2 sprays into both nostrils daily., Disp: , Rfl:    Fluticasone -Umeclidin-Vilant (TRELEGY ELLIPTA ) 100-62.5-25 MCG/ACT AEPB, Inhale 1 puff into the lungs daily., Disp: 1 each, Rfl: 5   furosemide  (LASIX ) 40 MG tablet, Take 40 mg by mouth daily as needed for fluid., Disp: , Rfl:    gabapentin  (NEURONTIN ) 300 MG capsule, Take 900 mg by mouth 3 (three) times daily. , Disp: , Rfl:    GLUCOSAMINE-CHONDROITIN PO, Take 1 tablet by mouth at bedtime. , Disp: , Rfl:    HYDROcodone -acetaminophen  (NORCO/VICODIN) 5-325 MG tablet, Take 1 tablet by mouth every 8 (eight) hours as needed., Disp: , Rfl:    lisinopril  (ZESTRIL ) 40 MG tablet, Take 20 mg by mouth daily., Disp: , Rfl:    magnesium  oxide (MAG-OX) 400 (240 Mg) MG tablet, Take 1 tablet (400 mg total) by mouth daily., Disp: 90 tablet, Rfl: 2   miconazole (MICOTIN) 2 % cream, Apply 1 application topically 2  (two) times daily as needed (fungus)., Disp: , Rfl:    montelukast  (SINGULAIR ) 10 MG tablet, Take 10 mg by mouth daily. , Disp: , Rfl:    Multiple Vitamin (MULTIVITAMIN WITH MINERALS) TABS tablet, Take 1 tablet by mouth daily., Disp: , Rfl:    nystatin cream (MYCOSTATIN), Apply 1 Application topically 2 (two) times daily as needed for dry skin., Disp: , Rfl:    Omega-3 Fatty Acids  (FISH OIL PO), Take 1,000 mg by mouth 2 (two) times daily., Disp: , Rfl:    omeprazole  (PRILOSEC) 20 MG capsule, Take 40 mg by mouth daily before breakfast. , Disp: , Rfl:    oxyBUTYnin  Chloride 2.5 MG TABS, Take 2.5 mg by mouth daily., Disp: , Rfl:    polyethylene glycol (MIRALAX ) 17 g packet, Take 17 g by mouth daily.,  Disp: , Rfl:    sertraline  (ZOLOFT ) 100 MG tablet, Take 100 mg by mouth 2 (two) times daily. , Disp: , Rfl:    sodium chloride  (OCEAN) 0.65 % SOLN nasal spray, Place 1 spray into both nostrils as needed for congestion., Disp: 104 mL, Rfl: 11  PAST MEDICAL HISTORY: Past Medical History:  Diagnosis Date   Abnormal stress test    Allergy    Arthritis    BACK   Chronic neck pain    Constipation    takes Sennokot daily   COPD (chronic obstructive pulmonary disease) (HCC)    Depression    PTSD-takes Zoloft  daily   Diabetes mellitus without complication (HCC)    diet controled   Dyspnea    GERD (gastroesophageal reflux disease)    Glaucoma    mild in left eye   H/O hiatal hernia    History of blood transfusion    History of bronchitis    History of cardiac cath    a. normal cors by cath in 04/2018 following an abnormal NST.   History of colon polyps    benign   History of echocardiogram    Echocardiogram 2/22: EF 65-70, no RWMA, normal diastolic function, normal RVSF, mild LAE, inadequate TR jet (indeterminate PASP)   Hyperlipidemia    Hypertension    Morbid obesity (HCC)    PONV (postoperative nausea and vomiting)    Sleep apnea    USES CPAP   Weakness    numbness and tingling. Takes  Gabapentin  daily    PAST SURGICAL HISTORY: Past Surgical History:  Procedure Laterality Date   ANTERIOR CERVICAL DECOMP/DISCECTOMY FUSION N/A 07/23/2016   Procedure: CERVICAL THREE-FOUR, CERVICAL FOUR-FIVE, CERVICAL FIVE-SIX ANTERIOR CERVICAL DECOMPRESSION/DISCECTOMY FUSION;  Surgeon: Victory Gunnels, MD;  Location[facility];  Service: Neurosurgery;  Laterality: N/A;   APPENDECTOMY     APPLICATION OF WOUND VAC Left 08/26/2023   Procedure: APPLICATION, WOUND VAC;  Surgeon: Edna Toribio LABOR, MD;  Location: [facility] ORS;  Service: Orthopedics;  Laterality: Left;   BACK SURGERY     x 4   BREAST REDUCTION SURGERY     CARPEL TUNNEL Bilateral    CATARACT EXTRACTION, BILATERAL  spring 2018   Dr. Oral   CHOLECYSTECTOMY     COLONOSCOPY     ESOPHAGOGASTRODUODENOSCOPY     LUMBAR LAMINECTOMY/DECOMPRESSION MICRODISCECTOMY Left 12/03/2012   Procedure: Left Lumbar Four to Five Diskectomy;  Surgeon: Catalina CHRISTELLA Stains, MD;  Location: [facility];  Service: Neurosurgery;  Laterality: Left;  LUMBAR LAMINECTOMY/DECOMPRESSION MICRODISCECTOMY 1 LEVEL   RIGHT/LEFT HEART CATH AND CORONARY ANGIOGRAPHY N/A 05/12/2018   Procedure: RIGHT/LEFT HEART CATH AND CORONARY ANGIOGRAPHY;  Surgeon: Burnard Debby LABOR, MD;  Location: [facility] INVASIVE CV LAB;  Service: Cardiovascular;  Laterality: N/A;   TOTAL KNEE ARTHROPLASTY Right    TOTAL KNEE ARTHROPLASTY Left 02/27/2017   Procedure: LEFT TOTAL KNEE ARTHROPLASTY;  Surgeon: Shari Toribio, MD;  Location: [facility] OR;  Service: Orthopedics;  Laterality: Left;   TOTAL KNEE REVISION Left 08/26/2023   Procedure: TOTAL KNEE REVISION;  Surgeon: Edna Toribio LABOR, MD;  Location: [facility] OR[facility];  Service: Orthopedics;  Laterality: Left;    FAMILY HISTORY: Family History  Problem Relation Age of Onset   Heart disease Mother    Stroke Mother    Emphysema Father    Pneumonia Father    Stroke Maternal Grandmother     SOCIAL HISTORY: Social History   Socioeconomic History   Marital status: Married     Spouse name: Not  on file   Number of children: Not on file   Years of education: Not on file   Highest education level: Not on file  Occupational History   Not on file  Tobacco Use   Smoking status: Former    Current packs/day: 0.00    Average packs/day: 2.0 packs/day for 40.0 years (80.0 ttl pk-yrs)    Types: Cigarettes    Start date: 05/17/1964    Quit date: 05/17/2004    Years since quitting: 19.7   Smokeless tobacco: Never  Vaping Use   Vaping status: Never Used  Substance and Sexual Activity   Alcohol  use: No   Drug use: No   Sexual activity: Not on file  Other Topics Concern   Not on file  Social History Narrative   Not on file   Social Drivers of Health   Financial Resource Strain: Not on file  Food Insecurity: No Food Insecurity (08/26/2023)   Hunger Vital Sign    Worried About Running Out of Food in the Last Year: Never true    Ran Out of Food in the Last Year: Never true  Transportation Needs: No Transportation Needs (08/26/2023)   PRAPARE - Administrator, Civil Service (Medical): No    Lack of Transportation (Non-Medical): No  Physical Activity: Not on file  Stress: Not on file  Social Connections: Moderately Integrated (08/26/2023)   Social Connection and Isolation Panel    Frequency of Communication with Friends and Family: More than three times a week    Frequency of Social Gatherings with Friends and Family: More than three times a week    Attends Religious Services: 1 to 4 times per year    Active Member of [employer] or Organizations: No    Attends Banker Meetings: Never    Marital Status: Married  Catering Manager Violence: Not At Risk (08/26/2023)   Humiliation, Afraid, Rape, and Kick questionnaire    Fear of Current or Ex-Partner: No    Emotionally Abused: No    Physically Abused: No    Sexually Abused: No       PHYSICAL EXAM  Vitals:   02/09/24 1113  BP: (!) 110/53  Pulse: (!) 58  Weight: 204 lb 3.2 oz (92.6 kg)   Height: 4' 9 (1.448 m)    Body mass index is 44.19 kg/m.   General: The patient is well-developed and well-nourished and in no acute distress  HEENT:  Head is [place]/AT.  Sclera are anicteric.  Funduscopic exam shows normal optic discs and retinal vessels.  Neck: No carotid bruits are noted.  The neck is tender over the right occiput.  Range of motion is mildly reduced.  Cardiovascular: The heart has a regular rate and rhythm with a normal S1 and S2. There were no murmurs, gallops or rubs.    Skin: Extremities are without rash or  edema.  Musculoskeletal:  Back is nontender  Neurologic Exam  Mental status: The patient is alert and oriented x 2 at the time of the examination.  Reduced short-term memory.  Reduced focus.  She scored 19/30 on the [facility] cognitive assessment (see results above).   Speech is normal.  Cranial nerves: Extraocular movements are full. Pupils are equal, round, and reactive to light and accomodation. here is good facial sensation to soft touch bilaterally.Facial strength is normal.  Trapezius and sternocleidomastoid strength is normal. No dysarthria is noted.   No obvious hearing deficits are noted.  Motor:  Muscle bulk is normal.  Tone is normal. Strength is  5 / 5 in all 4 extremities.   Sensory: Sensory testing is intact to pinprick, soft touch and vibration sensation in arms and just minimal reduced vibration in feet  Coordination: Cerebellar testing reveals good finger-nose-finger and heel-to-shin bilaterally.  Gait and station: Station is normal.   She can stand without support and take a few steps.  Stride is improved using her walker.  Romberg is negative.   Reflexes: Deep tendon reflexes are 1 and symmetric.       DIAGNOSTIC DATA (LABS, IMAGING, TESTING) - I reviewed patient records, labs, notes, testing and imaging myself where available.  Lab Results  Component Value Date   WBC 7.8 09/02/2023   HGB 8.4 (L) 09/02/2023   HCT 25.8 (L)  09/02/2023   MCV 89.3 09/02/2023   PLT 147 (L) 09/02/2023      Component Value Date/Time   NA 136 09/02/2023 0334   K 3.7 09/02/2023 0334   CL 106 09/02/2023 0334   CO2 22 09/02/2023 0334   GLUCOSE 112 (H) 09/02/2023 0334   BUN 19 09/02/2023 0334   CREATININE 0.70 09/02/2023 0334   CALCIUM  9.0 09/02/2023 0334   PROT 6.6 09/02/2023 0334   ALBUMIN 2.8 (L) 09/02/2023 0334   AST 172 (H) 09/02/2023 0334   ALT 80 (H) 09/02/2023 0334   ALKPHOS 84 09/02/2023 0334   BILITOT 0.9 09/02/2023 0334   GFRNONAA >60 09/02/2023 0334   GFRAA >60 05/07/2018 1554   No results found for: CHOL, HDL, LDLCALC, LDLDIRECT, TRIG, CHOLHDL Lab Results  Component Value Date   HGBA1C 5.7 (H) 08/13/2023   Lab Results  Component Value Date   VITAMINB12 242 08/31/2023   Lab Results  Component Value Date   TSH 0.887 08/31/2023       ASSESSMENT AND PLAN   Right-sided headache  Chronic tension-type headache, intractable  Mild cognitive impairment - Plan: Vitamin B12, Sedimentation rate, C-reactive protein, Thyroid  Panel With TSH, BETA AMYLOID 42/40 RATIO, PLASMA  COPD exacerbation (HCC)  OSA (obstructive sleep apnea)   In summary, Ms. Seever is a 73 year old woman who has had memory decline over at least the last year and also presents with a right occipital headache for the last several months.  A [place] cognitive assessment, she scored in the mild cognitive impairment stage.  I discussed with her and her husband that mild cognitive impairment is sometimes reversible if due to other cause such as reduced focus from medical disease, poor sleep or depression.  However, for some people it can be a transition to more significant cognitive change.  The headache has characteristics of a chronic tension type headache/occipital neuralgia.  To help evaluate her cognitive changes, we will check TSH, B12, amyloid beta 42/40 ratio.  Will also check ESR and CRP.  If no etiology from these blood  test and cognitive changes worsen consider adding donepezil and checking MRI of the brain For the headache, I did a right splenius capitis trigger point injection with 12 mg Celestone and Marcaine .  She tolerated the injection well and there were no complications.  Pain was a little bit better afterwards.  Will also check ESR/CRP She will return to see me as needed based on the results of the studies and her response to treatment. Thank you for asking me to see this patient.  Please let me know if I can be of further assistance with her or other patients in the future.       Titus Drone  RONAL Crete, MD, Teola RENO 02/09/2024, 11:59 AM Certified in Neurology, Clinical Neurophysiology, Sleep Medicine and Neuroimaging  [facility] [address] [place], [place] [address] [phone]

## 2024-02-11 LAB — THYROID PANEL WITH TSH
Free Thyroxine Index: 1.6 (ref 1.2–4.9)
T3 Uptake Ratio: 26 % (ref 24–39)
T4, Total: 6.2 ug/dL (ref 4.5–12.0)
TSH: 1.08 u[IU]/mL (ref 0.450–4.500)

## 2024-02-11 LAB — BETA AMYLOID 42/40 RATIO, PLASMA
Beta-amyloid 40: 228.61 pg/mL
Beta-amyloid 42/40/Ratio: 0.093 — AB (ref >0.102)
Beta-amyloid 42: 21.31 pg/mL

## 2024-02-11 LAB — VITAMIN B12: Vitamin B-12: 413 pg/mL (ref 232–1245)

## 2024-02-11 LAB — C-REACTIVE PROTEIN: CRP: 9 mg/L (ref 0–10)

## 2024-02-11 LAB — SEDIMENTATION RATE: Sed Rate: 50 mm/h — ABNORMAL HIGH (ref 0–40)

## 2024-02-12 ENCOUNTER — Other Ambulatory Visit: Payer: Self-pay | Admitting: Neurology

## 2024-02-12 ENCOUNTER — Ambulatory Visit: Payer: Self-pay | Admitting: Neurology

## 2024-02-12 MED ORDER — DONEPEZIL HCL 5 MG PO TABS: 5.0000 mg | ORAL_TABLET | Freq: Every day | ORAL | 11 refills | Status: AC

## 2024-02-12 NOTE — Telephone Encounter (Signed)
 the amyloid beta 42/40 ratio was decreased.  This is consistent with Alzheimer's disease.  The blood test is about 90% accurate.  I spoke to Ms. Lynn Dixon about these results.  I would like to get her started on donepezil 5 mg and this dose can be increased to 10 mg or well-tolerated after a month or so.  Her headache improved after the trigger point injection.  It has returned but is not quite as intense.  It does not change with talking or chewing.  The sed rate was mildly elevated at 50 (less than 40 is normal) but the CRP was normal.  I think the likelihood of giant cell arteritis is low.  I did advise her to let us  know if headaches worsen.

## 2024-02-16 ENCOUNTER — Ambulatory Visit: Admitting: Internal Medicine

## 2024-02-16 ENCOUNTER — Encounter: Payer: Self-pay | Admitting: Internal Medicine

## 2024-03-24 ENCOUNTER — Ambulatory Visit: Attending: Nurse Practitioner | Admitting: Nurse Practitioner

## 2024-03-24 ENCOUNTER — Encounter: Payer: Self-pay | Admitting: Nurse Practitioner

## 2024-03-24 VITALS — BP 118/72 | HR 49 | Ht <= 58 in | Wt 208.6 lb

## 2024-03-24 DIAGNOSIS — E782 Mixed hyperlipidemia: Secondary | ICD-10-CM

## 2024-03-24 DIAGNOSIS — I10 Essential (primary) hypertension: Secondary | ICD-10-CM | POA: Diagnosis not present

## 2024-03-24 DIAGNOSIS — R001 Bradycardia, unspecified: Secondary | ICD-10-CM

## 2024-03-24 DIAGNOSIS — J449 Chronic obstructive pulmonary disease, unspecified: Secondary | ICD-10-CM | POA: Diagnosis not present

## 2024-03-24 DIAGNOSIS — E785 Hyperlipidemia, unspecified: Secondary | ICD-10-CM | POA: Diagnosis not present

## 2024-03-24 DIAGNOSIS — G4733 Obstructive sleep apnea (adult) (pediatric): Secondary | ICD-10-CM

## 2024-03-24 DIAGNOSIS — Z87898 Personal history of other specified conditions: Secondary | ICD-10-CM

## 2024-03-24 DIAGNOSIS — R0609 Other forms of dyspnea: Secondary | ICD-10-CM

## 2024-03-24 DIAGNOSIS — Z79899 Other long term (current) drug therapy: Secondary | ICD-10-CM | POA: Diagnosis not present

## 2024-03-24 NOTE — Patient Instructions (Addendum)
 Medication Instructions:   Continue all current medications.   Labwork:  CBC, CMET, FLP - orders given today Reminder:  Nothing to eat or drink after 12 midnight prior to labs. Office will contact with results via phone, letter or mychart.    Testing/Procedures:  none  Follow-Up:  6 months   Any Other Special Instructions Will Be Listed Below (If Applicable).   If you need a refill on your cardiac medications before your next appointment, please call your pharmacy.

## 2024-03-24 NOTE — Progress Notes (Unsigned)
 Cardiology Office Note:  .   Date:  09/22/2023 ID:  Lynn Dixon, DOB July 23, 1950, MRN [id number] PCP: Lari Elspeth BRAVO, MD  [facility] Providers Cardiologist:  Alvan Carrier, MD    History of Present Illness: .   Lynn Dixon is a 73 y.o. female with a PMH of chest pain, hypertension, hyperlipidemia, history of remote tobacco abuse, DOE, COPD, and OSA, presents today for preoperative cardiovascular risk assessment.  History of extensive cardiac workup for symptoms of chest pain/DOE.  Overall workup revealed benign findings.  Was felt that symptoms were primarily pulmonology in etiology, related to OSA and COPD.  Last seen by Dr. Carrier Alvan on May 15, 2022.  Was overall doing well at that time.  Presents today for follow-up.  Since I have last seen her, she had revision left total knee replacement.  Doing well since surgery.  Denies any chest pain, worsening shortness of breath, palpitations, syncope, presyncope, dizziness, orthopnea, PND, swelling or significant weight changes, acute bleeding, or claudication.   Doing pretty good. Not related to heart. Knee revision. Left knee - 6 months ago. Doing okay. Chest pain real upset - long time. No recent worsening chest pain.   Feel - 4 months - lost balance. Uneven concrete. Physical therapy now. No hit head.   ROS: Negative. See HPI.   SH: Her and her husband will be celebrating 39 years of marriage this October 2025.  Will be taking a trip to [facility].  They have a timeshare there were family vacations regularly.  Studies Reviewed: SABRA    EKG: EKG is not ordered today.  Echo 05/2023: 1. Images are limited.   2. Left ventricular ejection fraction, by estimation, is 55 to 60%. The  left ventricle has normal function. Left ventricular endocardial border  not optimally defined to evaluate regional wall motion. Left ventricular  diastolic parameters are  indeterminate.   3. Right ventricular systolic function is  normal. The right ventricular  size is normal. There is normal pulmonary artery systolic pressure. The  estimated right ventricular systolic pressure is 26.6 mmHg.   4. Left atrial size was upper normal.   5. The mitral valve is grossly normal. Trivial mitral valve  regurgitation.   6. The aortic valve was not well visualized. Aortic valve regurgitation  is not visualized. Aortic valve mean gradient measures 5.0 mmHg.   7. The inferior vena cava is normal in size with greater than 50%  respiratory variability, suggesting right atrial pressure of 3 mmHg.   Comparison(s): Prior images reviewed side by side. LVEF normal range at 55-60%.  Echo 05/2020:  1. Left ventricular ejection fraction, by estimation, is 65 to 70%. The  left ventricle has normal function. The left ventricle has no regional  wall motion abnormalities. Left ventricular diastolic parameters were  normal.   2. Right ventricular systolic function is normal. The right ventricular  size is normal.   3. Left atrial size was mildly dilated.   4. The mitral valve is normal in structure. No evidence of mitral valve  regurgitation. No evidence of mitral stenosis.   5. The aortic valve was not well visualized. There is mild calcification  of the aortic valve. There is mild thickening of the aortic valve. Aortic  valve regurgitation is not visualized. No aortic stenosis is present.  Right/left heart cath 04/2018:  Mild right heart pressure elevation with mild pulmonary hypertension.   Normal LV function without focal segmental wall motion abnormalities; LVEDP 21 mmHg  Normal coronary arteries.   RECOMMENDATION: Medical therapy.  Suspect the patient's underlying COPD may be contributing to her exertional dyspnea.  Physical Exam:   VS:  There were no vitals taken for this visit.   Wt Readings from Last 3 Encounters:  02/09/24 204 lb 3.2 oz (92.6 kg)  09/24/23 208 lb (94.3 kg)  09/22/23 203 lb (92.1 kg)    GEN: Morbidly  obese, 72 y.o. female in no acute distress NECK: No JVD; No carotid bruits CARDIAC: S1/S2, RRR, no murmurs, rubs, gallops RESPIRATORY:  Clear to auscultation without rales, wheezing or rhonchi  ABDOMEN: Soft, non-tender, non-distended EXTREMITIES:  No edema; No deformity   ASSESSMENT AND PLAN: .    2. Hx of chest pain, DOE Denies any chest pain, breathing is stable per her report.  Right and left heart cath in 2020 showed normal coronary arteries.  See most recent echo report noted above.  No medication changes at this time. Care and ED precautions discussed.  Follow-up with PCP and pulmonology as scheduled.  3. HTN Blood pressure is well-controlled. Discussed to monitor BP at home at least 2 hours after medications and sitting for 5-10 minutes.  No medication changes at this time. Heart healthy diet and regular cardiovascular exercise encouraged. Continue to follow with PCP.  4. COPD/OSA Admits to stable dyspnea on exertion as noted above.  Recommended to follow-up with PCP and pulmonology.  No medication changes at this time. Care and ED precautions discussed.   5. Morbid obesity Weight loss via diet and exercise encouraged. Discussed the impact being overweight would have on cardiovascular risk.  6. Low magnesium  levels, medication management Mag level 3 weeks ago was 1.6. Will start Magnesium  Oxide 400 mg daily and repeat Mag level in 1 week. Continue to follow with PCP.  CBC, CMET, FLP - [facility].   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    Dispo: Follow-up with MD/APP in 6 months or sooner if anything changes.  Signed, Almarie Crate, NP
